# Patient Record
Sex: Female | Born: 1950 | Race: White | Hispanic: No | Marital: Married | Smoking: Former smoker
Health system: Southern US, Community
[De-identification: ages and names within clinical notes are randomized; demographics above are authoritative.]

## PROBLEM LIST (undated history)

## (undated) DIAGNOSIS — E039 Hypothyroidism, unspecified: Secondary | ICD-10-CM

## (undated) DIAGNOSIS — E669 Obesity, unspecified: Secondary | ICD-10-CM

## (undated) DIAGNOSIS — I10 Essential (primary) hypertension: Secondary | ICD-10-CM

## (undated) DIAGNOSIS — J189 Pneumonia, unspecified organism: Secondary | ICD-10-CM

## (undated) DIAGNOSIS — E785 Hyperlipidemia, unspecified: Secondary | ICD-10-CM

## (undated) DIAGNOSIS — M069 Rheumatoid arthritis, unspecified: Secondary | ICD-10-CM

## (undated) DIAGNOSIS — E119 Type 2 diabetes mellitus without complications: Secondary | ICD-10-CM

## (undated) DIAGNOSIS — J439 Emphysema, unspecified: Secondary | ICD-10-CM

## (undated) DIAGNOSIS — E559 Vitamin D deficiency, unspecified: Secondary | ICD-10-CM

## (undated) DIAGNOSIS — I5030 Unspecified diastolic (congestive) heart failure: Secondary | ICD-10-CM

## (undated) HISTORY — PX: TUBAL LIGATION: SHX77

## (undated) HISTORY — PX: COLONOSCOPY: SHX174

## (undated) HISTORY — DX: Hyperlipidemia, unspecified: E78.5

## (undated) HISTORY — DX: Essential (primary) hypertension: I10

## (undated) HISTORY — DX: Rheumatoid arthritis, unspecified: M06.9

## (undated) HISTORY — DX: Vitamin D deficiency, unspecified: E55.9

## (undated) HISTORY — PX: EYE SURGERY: SHX253

---

## 2004-02-14 ENCOUNTER — Ambulatory Visit (HOSPITAL_COMMUNITY): Admission: RE | Admit: 2004-02-14 | Discharge: 2004-02-14 | Payer: Self-pay | Admitting: Internal Medicine

## 2004-02-14 ENCOUNTER — Other Ambulatory Visit: Admission: RE | Admit: 2004-02-14 | Discharge: 2004-02-14 | Payer: Self-pay | Admitting: Internal Medicine

## 2004-06-20 ENCOUNTER — Ambulatory Visit (HOSPITAL_COMMUNITY): Admission: RE | Admit: 2004-06-20 | Discharge: 2004-06-20 | Payer: Self-pay | Admitting: Internal Medicine

## 2005-07-09 ENCOUNTER — Encounter: Admission: RE | Admit: 2005-07-09 | Discharge: 2005-07-09 | Payer: Self-pay | Admitting: Rheumatology

## 2006-02-02 ENCOUNTER — Other Ambulatory Visit: Admission: RE | Admit: 2006-02-02 | Discharge: 2006-02-02 | Payer: Self-pay | Admitting: Internal Medicine

## 2006-08-20 ENCOUNTER — Ambulatory Visit: Payer: Self-pay | Admitting: Critical Care Medicine

## 2006-10-05 ENCOUNTER — Ambulatory Visit: Payer: Self-pay | Admitting: Critical Care Medicine

## 2006-12-16 ENCOUNTER — Ambulatory Visit: Payer: Self-pay | Admitting: Critical Care Medicine

## 2007-03-05 ENCOUNTER — Ambulatory Visit: Payer: Self-pay | Admitting: Critical Care Medicine

## 2007-03-31 ENCOUNTER — Other Ambulatory Visit: Admission: RE | Admit: 2007-03-31 | Discharge: 2007-03-31 | Payer: Self-pay | Admitting: Internal Medicine

## 2007-04-09 ENCOUNTER — Ambulatory Visit: Payer: Self-pay | Admitting: Gastroenterology

## 2007-04-21 ENCOUNTER — Ambulatory Visit: Payer: Self-pay | Admitting: Gastroenterology

## 2007-04-21 ENCOUNTER — Encounter: Payer: Self-pay | Admitting: Gastroenterology

## 2007-04-22 DIAGNOSIS — J449 Chronic obstructive pulmonary disease, unspecified: Secondary | ICD-10-CM | POA: Insufficient documentation

## 2007-04-22 DIAGNOSIS — J45909 Unspecified asthma, uncomplicated: Secondary | ICD-10-CM | POA: Insufficient documentation

## 2007-04-27 ENCOUNTER — Ambulatory Visit (HOSPITAL_COMMUNITY): Admission: RE | Admit: 2007-04-27 | Discharge: 2007-04-27 | Payer: Self-pay | Admitting: Internal Medicine

## 2007-11-18 ENCOUNTER — Encounter: Payer: Self-pay | Admitting: Critical Care Medicine

## 2007-11-23 ENCOUNTER — Encounter: Payer: Self-pay | Admitting: Critical Care Medicine

## 2007-11-23 ENCOUNTER — Telehealth (INDEPENDENT_AMBULATORY_CARE_PROVIDER_SITE_OTHER): Payer: Self-pay | Admitting: *Deleted

## 2008-09-25 ENCOUNTER — Ambulatory Visit (HOSPITAL_COMMUNITY): Admission: RE | Admit: 2008-09-25 | Discharge: 2008-09-25 | Payer: Self-pay | Admitting: Internal Medicine

## 2010-03-18 ENCOUNTER — Encounter: Payer: Self-pay | Admitting: Gastroenterology

## 2010-07-07 ENCOUNTER — Encounter: Payer: Self-pay | Admitting: Internal Medicine

## 2010-07-18 NOTE — Letter (Signed)
Summary: Colonoscopy Letter  [facility]  [address] [place], [place] [address]   Phone: [phone]  Fax: [phone]      March 18, 2010 MRN: [id number]   RIHANA KIDDY [address] [place], [place]  [address]   Dear Ms. Schear,   According to your medical record, it is time for you to schedule a Colonoscopy. The American Cancer Society recommends this procedure as a method to detect early colon cancer. Patients with a family history of colon cancer, or a personal history of colon polyps or inflammatory bowel disease are at increased risk.  This letter has been generated based on the recommendations made at the time of your procedure. If you feel that in your particular situation this may no longer apply, please contact our office.  Please call our office at [phone] to schedule this appointment or to update your records at your earliest convenience.  Thank you for cooperating with [place] to provide you with the very best care possible.   Sincerely,  Rachael Fee, M.D.  [facility] [phone]

## 2010-10-29 NOTE — Assessment & Plan Note (Signed)
[facility]                             PULMONARY OFFICE NOTE   Jacqueline Washington, Jacqueline Washington                      MRN:          [id number]  DATE:12/16/2006                            DOB:          12/09/1950    Jacqueline Washington returns today in followup and has advanced chronic obstructive  lung disease, asthmatic bronchitis.  She is still smoking.  She did not  tolerate Chantix and stopped the medicine because of blurred vision.  Still smokes two packs a day of cigarettes, maintains Advair 500/50 one  spray b.i.d., Spiriva daily, Protonix daily.   EXAM:  Temp 97, blood pressure 120/80, pulse 83, saturation 96% room  air.  CHEST:  Distant breath sounds, prolonged expiratory phase, no wheeze or  rhonchi.  CARDIAC EXAM:  A regular rate and rhythm without S3, normal S1, S2.  ABDOMEN:  Soft, nontender.  EXTREMITIES:  No edema or clubbing.   IMPRESSION:  Asthmatic bronchitis with mild flare.   PLAN:  The patient to receive pulse prednisone with a quick taper and  Avelox 400 mg a day for 5 days for ongoing bronchitis.  The patient will  receive Nicotrol inhaler for smoking cessation, maintain Advair and  Spiriva as is and will see the patient back in followup in 2 months.     Charlcie Cradle Delford Field, MD, [facility]  Electronically Signed    PEW/MedQ  DD: 12/17/2006  DT: 12/17/2006  Job #: [id number]   cc:   Lovenia Kim, D.O.

## 2010-10-29 NOTE — Assessment & Plan Note (Signed)
[facility]                             PULMONARY OFFICE NOTE   Jacqueline Washington, Jacqueline Washington                      MRN:          [id number]  DATE:03/05/2007                            DOB:          [birth year]    Jacqueline Washington is seen in return followup.  Unfortunately, she is still  smoking 2 packs a day.  She has tried a variety of mechanisms.  The  Nicotrol inhaler we offered last time, she says, she cannot afford.  I  pointed out to her that all of her health care medications and visits,  and the amount of cigarettes she is consuming, will soon add up to the  $200 of the cost to get the Nicotrol inhaler.  She took this under  advisement.   EXAM:  Temperature is 97, blood pressure 128/84, pulse 82, saturation  97% on room air.  CHEST:  Showed distant breath sounds, prolonged expiratory phase.  No  wheeze or rhonchi.  CARDIAC:  Showed a regular rate and rhythm without S3.  Normal S1, S2.  ABDOMEN:  Soft, nontender.  EXTREMITIES:  Showed no edema or clubbing.   IMPRESSION:  Chronic obstructive lung disease, asthmatic bronchitic  component, ongoing active smoking.   PLAN:  To maintain Advair and Spiriva as currently dosed.  No change in  plan of care is made and we will see the patient back in 4 months.     Charlcie Cradle Delford Field, MD, [facility]  Electronically Signed    PEW/MedQ  DD: 03/05/2007  DT: 03/05/2007  Job #: [id number]   cc:   Lovenia Kim, D.O.

## 2010-11-01 NOTE — Assessment & Plan Note (Signed)
[facility]                             PULMONARY OFFICE NOTE   TIFFNAY, BOSSI                      MRN:          [id number]  DATE:10/05/2006                            DOB:          20-Oct-1950    Ms. Brierley returns today in follow-up.  Has asthmatic bronchitis, chronic  obstructive lung disease, reflux disease.  She is doing markedly better  on the Protonix.  She is on this at 40 mg daily, maintains Advair 500/50  mcg one spray b.i.d.  She is on the Chantix daily and Spiriva daily.  Her smoking is down to one pack a day.   PHYSICAL EXAMINATION:  VITAL SIGNS:  Temperature 97, blood pressure  120/82, pulse 83, saturation 94% on room air.  CHEST:  Distant breath sounds with prolonged expiratory phase, no wheeze  or rhonchi were noted.  CARDIAC:  A regular rate and rhythm without S3, normal S1, S2.  ABDOMEN:  Soft, nontender.  EXTREMITIES:  No edema or clubbing.   IMPRESSION:  Asthmatic bronchitis with stable airflow function.   PLAN:  Maintain Advair, Spiriva and Protonix as prescribed.  She is to  continue to pursue smoking cessation with Chantix, and we will see the  patient back in return follow-up in 2 months.     Charlcie Cradle Delford Field, MD, [facility]  Electronically Signed    PEW/MedQ  DD: 10/05/2006  DT: 10/06/2006  Job #: [id number]   cc:   Lovenia Kim, D.O.

## 2010-11-01 NOTE — Assessment & Plan Note (Signed)
[facility]                             PULMONARY OFFICE NOTE   NAME:Jacqueline Washington, Prehn             MRN:          [id number]  DATE:08/20/2006                            DOB:          November 17, 1950    CHIEF COMPLAINT:  Evaluate pneumonia.   This is a 60 year old white female, who was diagnosed with pneumonia on  February 21.  Symptoms began on February 13 with increased dyspnea.  She  was treated with antibiotics as if she had pneumonia, but an x-ray was  not obtained, and she then later had a CT scan of the chest, which does  not show pneumonia, but she was referred for further evaluation.  She  was given prednisone and a Z-Pak, followed by Levaquin and then more  prednisone, which she is all finishing.  She is a heavy smoker, smoking  two packs a day for 42 years.  She is now on Chantix for this, but is  still smoking some.  Chantix was just started on August 19, 2006.  Shortness of breath is better.  The cough is dry.  It was productive,  thick, green, but now is dry.  She had some chest pain, but is now  better.  She has been on Spiriva and Advair since 2004, had also  pneumonia in 2004 and also when she was younger.  She is referred now  for further evaluation.  She is short of breath with rest and exertion.  She does note increased acid reflux symptoms currently.  She denies any  loss of appetite, weight change, abdominal pain, difficulty swallowing.  There is no sore throat, headache, sneezing, itching, earache, anxiety,  depression, hand or foot swelling, joint stiffness or pain, or rash.   PAST MEDICAL HISTORY:  Medical history of asthma, emphysema, history of  tubal ligation.   His sister had TB in the fifties.   MEDICATION ALLERGIES:  PENICILLIN, SULFA.   SOCIAL HISTORY:  The patient smokes, as noted above.  Currently not  employed.  Lives with her husband and children at home.   FAMILY HISTORY:  Allergies in her mother.  Heart  disease in her mother.   REVIEW OF SYSTEMS:  Otherwise noncontributory.   CURRENT MEDICATIONS:  1. Spiriva one daily.  2. Advair 250/50 one spray b.i.d.  3. Chantix daily.   PHYSICAL EXAM:  This is an obese female in no distress.  Temperature 98,  blood pressure 134/86, pulse 84, saturation 96% on room air.  CHEST:  Showed distant breath sounds with prolonged expiratory phase, no  wheeze or rhonchi are noted.  CARDIAC EXAM:  Showed a regular rate and rhythm without S3, normal S1  and S2.  ABDOMEN:  Soft, protuberant.  Bowel sounds active.  EXTREMITIES:  Showed no edema, clubbing or venous disease.  SKIN:  Clear.  NEUROLOGIC EXAM:  Intact.  HEENT EXAM:  Showed no jugular venous distention or lymphadenopathy.  Oropharynx is clear.  The neck is supple.   A CT scan of the chest was obtained and reviewed and revealed no  pneumonia.  Hyperinflation with emphysema is noted and borderline  mediastinal hilar nodes, likely reactive.  IMPRESSION:  1. Asthmatic bronchitis.  2. Primary emphysema.  3. Reflux disease.  4. No current pneumonia.  5. Obesity.   RECOMMENDATIONS:  Pursue smoking cessation with Chantix.  Check full  pulmonary functions.  She was re-instructed as to proper Spiriva Advair  technique.  Advair increased to 500/50 one spray b.i.d. and Protonix  administered at 40 mg daily with a GERD education sheet given.  She will  return to this office in six weeks.     Charlcie Cradle Delford Field, MD, [facility]  Electronically Signed    PEW/MedQ  DD: 08/21/2006  DT: 08/21/2006  Job #: [id number]   cc:   Lovenia Kim, D.O.

## 2011-03-04 ENCOUNTER — Other Ambulatory Visit (HOSPITAL_COMMUNITY): Payer: Self-pay | Admitting: Internal Medicine

## 2011-03-04 ENCOUNTER — Ambulatory Visit (HOSPITAL_COMMUNITY)
Admission: RE | Admit: 2011-03-04 | Discharge: 2011-03-04 | Disposition: A | Discharge status: Home / Self Care | Payer: Medicare Other | Source: Ambulatory Visit | Attending: Internal Medicine | Admitting: Internal Medicine

## 2011-03-04 DIAGNOSIS — R0602 Shortness of breath: Secondary | ICD-10-CM

## 2011-03-04 DIAGNOSIS — R059 Cough, unspecified: Secondary | ICD-10-CM | POA: Insufficient documentation

## 2011-03-04 DIAGNOSIS — Z0000 Encounter for general adult medical examination without abnormal findings: Secondary | ICD-10-CM | POA: Insufficient documentation

## 2011-03-04 DIAGNOSIS — R05 Cough: Secondary | ICD-10-CM | POA: Insufficient documentation

## 2011-03-04 DIAGNOSIS — J438 Other emphysema: Secondary | ICD-10-CM | POA: Insufficient documentation

## 2011-03-04 DIAGNOSIS — Z1231 Encounter for screening mammogram for malignant neoplasm of breast: Secondary | ICD-10-CM

## 2011-03-13 ENCOUNTER — Ambulatory Visit (HOSPITAL_COMMUNITY)
Admission: RE | Admit: 2011-03-13 | Discharge: 2011-03-13 | Disposition: A | Discharge status: Home / Self Care | Payer: Medicare Other | Source: Ambulatory Visit | Attending: Internal Medicine | Admitting: Internal Medicine

## 2011-03-13 DIAGNOSIS — Z1231 Encounter for screening mammogram for malignant neoplasm of breast: Secondary | ICD-10-CM | POA: Insufficient documentation

## 2011-06-26 ENCOUNTER — Other Ambulatory Visit: Payer: Self-pay | Admitting: Internal Medicine

## 2011-06-26 DIAGNOSIS — R0602 Shortness of breath: Secondary | ICD-10-CM

## 2011-06-26 DIAGNOSIS — R0600 Dyspnea, unspecified: Secondary | ICD-10-CM

## 2011-06-27 ENCOUNTER — Ambulatory Visit
Admission: RE | Admit: 2011-06-27 | Discharge: 2011-06-27 | Disposition: A | Discharge status: Home / Self Care | Payer: Medicare Other | Source: Ambulatory Visit | Attending: Internal Medicine | Admitting: Internal Medicine

## 2011-06-27 DIAGNOSIS — R0600 Dyspnea, unspecified: Secondary | ICD-10-CM

## 2011-06-27 DIAGNOSIS — R0602 Shortness of breath: Secondary | ICD-10-CM

## 2011-06-27 MED ORDER — IOHEXOL 350 MG/ML SOLN
125.0000 mL | Freq: Once | INTRAVENOUS | Status: AC | PRN
  Administered 2011-06-27: 125 mL via INTRAVENOUS

## 2011-07-15 ENCOUNTER — Other Ambulatory Visit (HOSPITAL_COMMUNITY): Payer: Self-pay | Admitting: Internal Medicine

## 2011-07-15 DIAGNOSIS — I2699 Other pulmonary embolism without acute cor pulmonale: Secondary | ICD-10-CM

## 2011-07-21 ENCOUNTER — Encounter (HOSPITAL_COMMUNITY)
Admission: RE | Admit: 2011-07-21 | Discharge: 2011-07-21 | Disposition: A | Discharge status: Home / Self Care | Payer: Medicare Other | Source: Ambulatory Visit | Attending: Internal Medicine | Admitting: Internal Medicine

## 2011-07-21 DIAGNOSIS — J984 Other disorders of lung: Secondary | ICD-10-CM | POA: Insufficient documentation

## 2011-07-21 DIAGNOSIS — I2699 Other pulmonary embolism without acute cor pulmonale: Secondary | ICD-10-CM

## 2011-07-21 MED ORDER — FLUDEOXYGLUCOSE F - 18 (FDG) INJECTION
20.6000 | Freq: Once | INTRAVENOUS | Status: AC | PRN
  Administered 2011-07-21: 20.6 via INTRAVENOUS

## 2011-08-08 ENCOUNTER — Institutional Professional Consult (permissible substitution): Payer: Medicare Other | Admitting: Pulmonary Disease

## 2011-08-12 ENCOUNTER — Ambulatory Visit (INDEPENDENT_AMBULATORY_CARE_PROVIDER_SITE_OTHER)
Admission: RE | Admit: 2011-08-12 | Discharge: 2011-08-12 | Disposition: A | Discharge status: Home / Self Care | Payer: Medicare Other | Source: Ambulatory Visit | Attending: Critical Care Medicine | Admitting: Critical Care Medicine

## 2011-08-12 ENCOUNTER — Other Ambulatory Visit (INDEPENDENT_AMBULATORY_CARE_PROVIDER_SITE_OTHER): Payer: Medicare Other

## 2011-08-12 ENCOUNTER — Ambulatory Visit (INDEPENDENT_AMBULATORY_CARE_PROVIDER_SITE_OTHER): Payer: Medicare Other | Admitting: Critical Care Medicine

## 2011-08-12 ENCOUNTER — Encounter: Payer: Self-pay | Admitting: Critical Care Medicine

## 2011-08-12 DIAGNOSIS — E876 Hypokalemia: Secondary | ICD-10-CM

## 2011-08-12 DIAGNOSIS — R918 Other nonspecific abnormal finding of lung field: Secondary | ICD-10-CM

## 2011-08-12 DIAGNOSIS — J449 Chronic obstructive pulmonary disease, unspecified: Secondary | ICD-10-CM

## 2011-08-12 DIAGNOSIS — L0390 Cellulitis, unspecified: Secondary | ICD-10-CM

## 2011-08-12 DIAGNOSIS — R05 Cough: Secondary | ICD-10-CM

## 2011-08-12 LAB — CBC WITH DIFFERENTIAL/PLATELET
Eosinophils Absolute: 0.3 10*3/uL (ref 0.0–0.7)
HCT: 43.4 % (ref 36.0–46.0)
Lymphs Abs: 2.2 10*3/uL (ref 0.7–4.0)
MCHC: 32.4 g/dL (ref 30.0–36.0)
MCV: 98.6 fl (ref 78.0–100.0)
Monocytes Absolute: 1.2 10*3/uL — ABNORMAL HIGH (ref 0.1–1.0)
Neutrophils Relative %: 49.2 % (ref 43.0–77.0)
Platelets: 210 10*3/uL (ref 150.0–400.0)
RDW: 16.4 % — ABNORMAL HIGH (ref 11.5–14.6)
WBC: 7.4 10*3/uL (ref 4.5–10.5)

## 2011-08-12 LAB — BASIC METABOLIC PANEL
BUN: 27 mg/dL — ABNORMAL HIGH (ref 6–23)
CO2: 41 mEq/L — ABNORMAL HIGH (ref 19–32)
Calcium: 9.7 mg/dL (ref 8.4–10.5)
Creatinine, Ser: 1.2 mg/dL (ref 0.4–1.2)
Glucose, Bld: 104 mg/dL — ABNORMAL HIGH (ref 70–99)

## 2011-08-12 NOTE — Progress Notes (Signed)
Subjective:    Patient ID: Jacqueline Washington, female    DOB: 10-27-1950, 60 y.o.   MRN: [id number]  HPI 61 y.o. WF referred for abn CT Pt noted onset bad URI onset end 05/2011.  Noted a cough, no fever.  Noted chest pain ant chest area. Noted more dyspnea with minimal exertion.  Rx Abx doxy, prednisone.  CXR abnormal.  CT abn but PET scan did not show uptake.   Then got Zpak and pred and this helped more. Now currently: is still dyspneic.  Notes wheeze with walking. Pt denies back to baseline. No real coughing.  Pt notes light headed and nauseated.  Notes dysphagia. No heartburn No fever now.  Past Medical History  Diagnosis Date  . Asthma   . Emphysema   . Rheumatoid arthritis      Family History  Problem Relation Age of Onset  . Asthma Mother   . Heart disease Mother   . Clotting disorder Mother      History   Social History  . Marital Status: Married    Spouse Name: N/A    Number of Children: 2  . Years of Education: N/A   Occupational History  .     Social History Main Topics  . Smoking status: Former Smoker -- 3.0 packs/day for 43 years    Types: Cigarettes    Quit date: 04/24/2007  . Smokeless tobacco: Never Used  . Alcohol Use: Yes     occasional  . Drug Use: No  . Sexually Active: Not on file   Other Topics Concern  . Not on file   Social History Narrative  . No narrative on file     Allergies  Allergen Reactions  . Penicillins   . Sulfonamide Derivatives   . Tetanus Toxoid      No outpatient prescriptions prior to visit.      Review of Systems  Constitutional: Positive for activity change, appetite change and fatigue. Negative for fever, chills, diaphoresis and unexpected weight change.  HENT: Positive for trouble swallowing and voice change. Negative for hearing loss, ear pain, nosebleeds, congestion, sore throat, facial swelling, rhinorrhea, sneezing, mouth sores, neck pain, neck stiffness, dental problem, postnasal drip, sinus pressure,  tinnitus and ear discharge.   Eyes: Positive for visual disturbance. Negative for photophobia, discharge and itching.  Respiratory: Positive for chest tightness, shortness of breath and wheezing. Negative for apnea, cough, choking and stridor.   Cardiovascular: Positive for leg swelling. Negative for chest pain and palpitations.  Gastrointestinal: Negative for nausea, vomiting, abdominal pain, constipation, blood in stool and abdominal distention.  Genitourinary: Negative for dysuria, urgency, frequency, hematuria, flank pain, decreased urine volume and difficulty urinating.  Musculoskeletal: Positive for back pain, arthralgias and gait problem. Negative for myalgias and joint swelling.  Skin: Negative for color change, pallor and rash.  Neurological: Positive for dizziness, weakness and light-headedness. Negative for tremors, seizures, syncope, speech difficulty, numbness and headaches.  Hematological: Negative for adenopathy. Bruises/bleeds easily.  Psychiatric/Behavioral: Positive for confusion. Negative for sleep disturbance and agitation. The patient is not nervous/anxious.        Objective:   Physical Exam Filed Vitals:   08/12/11 1549 08/12/11 1553  BP: 114/76   Pulse: 86   Temp: 98.1 F (36.7 C)   TempSrc: Oral   Height: 5\' 4"  (1.626 m)   Weight: 243 lb 9.6 oz (110.496 kg)   SpO2: 89% 92%    Gen: Pleasant, well-nourished, in no distress,  normal affect  ENT:  No lesions,  mouth clear,  oropharynx clear, no postnasal drip  Neck: No JVD, no TMG, no carotid bruits  Lungs: No use of accessory muscles, no dullness to percussion, distant breath sounds   Cardiovascular: RRR, heart sounds normal, no murmur or gallops, no peripheral edema  Abdomen: soft and NT, no HSM,  BS normal  Musculoskeletal: No deformities, no cyanosis or clubbing  Neuro: alert, non focal  Skin  resolving cellulitis   Dg Chest 2 View  08/12/2011  *RADIOLOGY REPORT*  Clinical Data: Cough.  Asthma.   COPD.  Former smoker.  CHEST - 2 VIEW  Comparison: 03/04/2011  Findings: Scarring in the right upper lobe is stable.  Pulmonary interstitial prominence and mild hyperinflation is also stable. There is no evidence of acute infiltrate or pleural effusion. Heart size is normal.  Hilar and mediastinal contours are unremarkable and stable.  IMPRESSION: Stable COPD and right upper lobe scarring.  No acute findings.  Original Report Authenticated By: Danae Orleans, M.D.          Assessment & Plan:   OBSTRUCTIVE CHRONIC BRONCHITIS WITHOUT EXACERBAT Chronic obstructive lung disease with asthmatic bronchitic component stable at this time Plan Maintain inhaled medications as currently prescribed  Pulmonary nodules Bilateral pulmonary nodules most of which are scarring in right upper lobe. The left lower lobe showed a result resolution of the infiltrate which likely was pneumonic process Plan No evidence of malignancy No need for biopsy Will follow  Hypokalemia Hypokalemia documented on lab drawn today Plan Hold diuretics Increase potassium supplementation Case discussed with patient's primary care provider who will follow and monitor  Cellulitis Cellulitis of both lower extremities now resolving with associated adverse side effects do to doxycycline Plan Discontinue further doxycycline   Note chest x-ray obtained today does not show evidence of active disease process Updated Medication List Outpatient Encounter Prescriptions as of 08/12/2011  Medication Sig Dispense Refill  . Ascorbic Acid (VITAMIN C) 1000 MG tablet Take 1,000 mg by mouth daily.      . cetirizine (ZYRTEC) 10 MG tablet Take 10 mg by mouth daily.      . Cholecalciferol (VITAMIN D-3) 5000 UNITS TABS Take 1 tablet by mouth daily.      . Cyanocobalamin (VITAMIN B12 TR PO) Take 2,500 mcg by mouth daily.      . Fluticasone-Salmeterol (ADVAIR DISKUS) 500-50 MCG/DOSE AEPB Inhale 1 puff into the lungs every 12 (twelve) hours.       . folic acid (FOLVITE) 1 MG tablet Take 1 mg by mouth daily.      . furosemide (LASIX) 40 MG tablet Take 1 tablet by mouth Twice daily.      Marland Kitchen KLOR-CON M20 20 MEQ tablet Take 1 tablet by mouth Twice daily.      Marland Kitchen levothyroxine (SYNTHROID, LEVOTHROID) 50 MCG tablet Take 1 tablet by mouth Daily.      . methotrexate (RHEUMATREX) 2.5 MG tablet Take 6 tablets by mouth Once a week.      . metolazone (ZAROXOLYN) 5 MG tablet Take 1 tablet by mouth Daily.      . Multiple Vitamin (MULTIVITAMIN) tablet Take 1 tablet by mouth daily.      Marland Kitchen SPIRIVA HANDIHALER 18 MCG inhalation capsule Place 1 capsule into inhaler and inhale Daily.      Marland Kitchen DISCONTD: doxycycline (VIBRA-TABS) 100 MG tablet Take 1 tablet by mouth Twice daily.

## 2011-08-12 NOTE — Patient Instructions (Signed)
Stop doxycycline Stay on inhalers as prescribed Chest xray today Labs today Full pulmonary function studies Return 3-4 weeks [facility] office

## 2011-08-13 ENCOUNTER — Telehealth: Payer: Self-pay | Admitting: Critical Care Medicine

## 2011-08-13 DIAGNOSIS — R918 Other nonspecific abnormal finding of lung field: Secondary | ICD-10-CM | POA: Insufficient documentation

## 2011-08-13 DIAGNOSIS — E876 Hypokalemia: Secondary | ICD-10-CM | POA: Insufficient documentation

## 2011-08-13 NOTE — Progress Notes (Signed)
Quick Note:  Notify the patient that the Xray is stable and no pneumonia Tell her pneumonia has resolved. Nodules are benign, just scar tissue. Doubt related to rheumatoid arthritis No change in medications are recommended. Continue current meds as prescribed at last office visit ______

## 2011-08-13 NOTE — Assessment & Plan Note (Signed)
Cellulitis of both lower extremities now resolving with associated adverse side effects do to doxycycline Plan Discontinue further doxycycline

## 2011-08-13 NOTE — Telephone Encounter (Signed)
Notes Recorded by Shan Levans, MD on 08/13/2011 at 9:07 AM Notify the patient that the Xray is stable and no pneumonia Tell her pneumonia has resolved. Nodules are benign, just scar tissue. Doubt related to rheumatoid arthritis No change in medications are recommended. Continue current meds as prescribed at last office visit   Pt aware of results; no questions or concerns at this time.

## 2011-08-13 NOTE — Progress Notes (Signed)
Quick Note:  Pt aware of results;no questions or concerns at this time. ______ 

## 2011-08-13 NOTE — Assessment & Plan Note (Signed)
Chronic obstructive lung disease with asthmatic bronchitic component stable at this time Plan Maintain inhaled medications as currently prescribed

## 2011-08-13 NOTE — Assessment & Plan Note (Signed)
Bilateral pulmonary nodules most of which are scarring in right upper lobe. The left lower lobe showed a result resolution of the infiltrate which likely was pneumonic process Plan No evidence of malignancy No need for biopsy Will follow

## 2011-08-13 NOTE — Assessment & Plan Note (Signed)
Hypokalemia documented on lab drawn today Plan Hold diuretics Increase potassium supplementation Case discussed with patient's primary care provider who will follow and monitor

## 2011-08-13 NOTE — Progress Notes (Signed)
Quick Note:  [id number] ______ 

## 2011-08-26 LAB — HYPERSENSITIVITY PNUEMONITIS PROFILE

## 2011-08-26 LAB — FUNGAL ANTIBODIES PANEL, ID-BLOOD
Aspergillus Flavus Antibodies: NEGATIVE
Aspergillus Niger Antibodies: NEGATIVE

## 2011-08-26 NOTE — Progress Notes (Signed)
Quick Note:  Called, spoke with pt. 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 Delford Field. Advised he will discuss this in further at next OV. She verbalized understanding of this and voiced no further questions/concerns at this time. ______

## 2011-08-28 ENCOUNTER — Other Ambulatory Visit: Payer: Self-pay | Admitting: Rheumatology

## 2011-08-28 DIAGNOSIS — M069 Rheumatoid arthritis, unspecified: Secondary | ICD-10-CM

## 2011-09-08 ENCOUNTER — Ambulatory Visit
Admission: RE | Admit: 2011-09-08 | Discharge: 2011-09-08 | Disposition: A | Discharge status: Home / Self Care | Payer: No Typology Code available for payment source | Source: Ambulatory Visit | Attending: Rheumatology | Admitting: Rheumatology

## 2011-09-08 DIAGNOSIS — M069 Rheumatoid arthritis, unspecified: Secondary | ICD-10-CM

## 2011-09-08 MED ORDER — GADOBENATE DIMEGLUMINE 529 MG/ML IV SOLN
20.0000 mL | Freq: Once | INTRAVENOUS | Status: AC | PRN
  Administered 2011-09-08: 20 mL via INTRAVENOUS

## 2011-09-09 ENCOUNTER — Ambulatory Visit (INDEPENDENT_AMBULATORY_CARE_PROVIDER_SITE_OTHER): Payer: Medicare Other | Admitting: Critical Care Medicine

## 2011-09-09 ENCOUNTER — Encounter: Payer: Self-pay | Admitting: Critical Care Medicine

## 2011-09-09 VITALS — BP 128/80 | HR 72 | Temp 97.6°F | Ht 64.0 in | Wt 234.4 lb

## 2011-09-09 DIAGNOSIS — J449 Chronic obstructive pulmonary disease, unspecified: Secondary | ICD-10-CM

## 2011-09-09 DIAGNOSIS — R918 Other nonspecific abnormal finding of lung field: Secondary | ICD-10-CM

## 2011-09-09 NOTE — Assessment & Plan Note (Signed)
Stable asthmatic bronchitis with recent exacerbation due to URI The patient also has mold exposure in the home with indoor plants and positive Aspergillus assay on fungal immunodiffusion assay Plan Clean the plants properly or discard plants that have mold growth on them Maintain inhaled medications as prescribed

## 2011-09-09 NOTE — Assessment & Plan Note (Signed)
Likely benign pulmonary nodules without change

## 2011-09-09 NOTE — Progress Notes (Signed)
Subjective:    Patient ID: Jacqueline Washington, female    DOB: August 21, 1950, 61 y.o.   MRN: [id number]  HPI  61 y.o. WF referred for abn CT Pt noted onset bad URI onset end 05/2011.  Noted a cough, no fever.  Noted chest pain ant chest area. Noted more dyspnea with minimal exertion.  Rx Abx doxy, prednisone.  CXR abnormal.  CT abn but PET scan did not show uptake.   Then got Zpak and pred and this helped more. Now currently: is still dyspneic.  Notes wheeze with walking. Pt denies back to baseline. No real coughing.  Pt notes light headed and nauseated.  Notes dysphagia. No heartburn No fever now.  09/09/2011 K level is better.  No cough now.  Fungal study:  Pos aspergillous antibody. Now no wheeze.  Cellulitis is better Pt denies any significant sore throat, nasal congestion or excess secretions, fever, chills, sweats, unintended weight loss, pleurtic or exertional chest pain, orthopnea PND, or leg swelling Pt denies any increase in rescue therapy over baseline, denies waking up needing it or having any early am or nocturnal exacerbations of coughing/wheezing/or dyspnea. Pt also denies any obvious fluctuation in symptoms with  weather or environmental change or other alleviating or aggravating factors   Past Medical History  Diagnosis Date  . Asthma   . Emphysema   . Rheumatoid arthritis      Family History  Problem Relation Age of Onset  . Asthma Mother   . Heart disease Mother   . Clotting disorder Mother      History   Social History  . Marital Status: Married    Spouse Name: N/A    Number of Children: 2  . Years of Education: N/A   Occupational History  .     Social History Main Topics  . Smoking status: Former Smoker -- 3.0 packs/day for 43 years    Types: Cigarettes    Quit date: 04/24/2007  . Smokeless tobacco: Never Used  . Alcohol Use: Yes     occasional  . Drug Use: No  . Sexually Active: Not on file   Other Topics Concern  . Not on file   Social  History Narrative  . No narrative on file     Allergies  Allergen Reactions  . Penicillins   . Sulfonamide Derivatives   . Tetanus Toxoid      Outpatient Prescriptions Prior to Visit  Medication Sig Dispense Refill  . Fluticasone-Salmeterol (ADVAIR DISKUS) 500-50 MCG/DOSE AEPB Inhale 1 puff into the lungs every 12 (twelve) hours.      . folic acid (FOLVITE) 1 MG tablet Take 1 mg by mouth daily.      . furosemide (LASIX) 40 MG tablet Take 1 tablet by mouth daily.       Marland Kitchen KLOR-CON M20 20 MEQ tablet Take 8 tablets by mouth daily.       Marland Kitchen levothyroxine (SYNTHROID, LEVOTHROID) 50 MCG tablet Take 1 tablet by mouth Daily.      . methotrexate (RHEUMATREX) 2.5 MG tablet Take 6 tablets by mouth Once a week.      . metolazone (ZAROXOLYN) 5 MG tablet Take 1 tablet by mouth Daily.      Marland Kitchen SPIRIVA HANDIHALER 18 MCG inhalation capsule Place 1 capsule into inhaler and inhale Daily.      . Ascorbic Acid (VITAMIN C) 1000 MG tablet Take 1,000 mg by mouth daily.      . cetirizine (ZYRTEC) 10 MG tablet Take 10  mg by mouth daily.      . Cholecalciferol (VITAMIN D-3) 5000 UNITS TABS Take 1 tablet by mouth daily.      . Cyanocobalamin (VITAMIN B12 TR PO) Take 2,500 mcg by mouth daily.      . Multiple Vitamin (MULTIVITAMIN) tablet Take 1 tablet by mouth daily.       Facility-Administered Medications Prior to Visit  Medication Dose Route Frequency Provider Last Rate Last Dose  . gadobenate dimeglumine (MULTIHANCE) injection 20 mL  20 mL Intravenous Once PRN Medication Radiologist, MD   20 mL at 09/08/11 1522      Review of Systems  Constitutional: Positive for activity change, appetite change and fatigue. Negative for fever, chills, diaphoresis and unexpected weight change.  HENT: Positive for trouble swallowing and voice change. Negative for hearing loss, ear pain, nosebleeds, congestion, sore throat, facial swelling, rhinorrhea, sneezing, mouth sores, neck pain, neck stiffness, dental problem, postnasal  drip, sinus pressure, tinnitus and ear discharge.   Eyes: Positive for visual disturbance. Negative for photophobia, discharge and itching.  Respiratory: Positive for chest tightness, shortness of breath and wheezing. Negative for apnea, cough, choking and stridor.   Cardiovascular: Positive for leg swelling. Negative for chest pain and palpitations.  Gastrointestinal: Negative for nausea, vomiting, abdominal pain, constipation, blood in stool and abdominal distention.  Genitourinary: Negative for dysuria, urgency, frequency, hematuria, flank pain, decreased urine volume and difficulty urinating.  Musculoskeletal: Positive for back pain, arthralgias and gait problem. Negative for myalgias and joint swelling.  Skin: Negative for color change, pallor and rash.  Neurological: Positive for dizziness, weakness and light-headedness. Negative for tremors, seizures, syncope, speech difficulty, numbness and headaches.  Hematological: Negative for adenopathy. Bruises/bleeds easily.  Psychiatric/Behavioral: Positive for confusion. Negative for sleep disturbance and agitation. The patient is not nervous/anxious.        Objective:   Physical Exam  Filed Vitals:   09/09/11 1121  BP: 128/80  Pulse: 72  Temp: 97.6 F (36.4 C)  TempSrc: Oral  Height: 5\' 4"  (1.626 m)  Weight: 234 lb 6.4 oz (106.323 kg)  SpO2: 93%    Gen: Pleasant, well-nourished, in no distress,  normal affect  ENT: No lesions,  mouth clear,  oropharynx clear, no postnasal drip  Neck: No JVD, no TMG, no carotid bruits  Lungs: No use of accessory muscles, no dullness to percussion, distant breath sounds   Cardiovascular: RRR, heart sounds normal, no murmur or gallops, no peripheral edema  Abdomen: soft and NT, no HSM,  BS normal  Musculoskeletal: No deformities, no cyanosis or clubbing  Neuro: alert, non focal  Skin clear        Assessment & Plan:   OBSTRUCTIVE CHRONIC BRONCHITIS WITHOUT EXACERBAT Stable asthmatic  bronchitis with recent exacerbation due to URI The patient also has mold exposure in the home with indoor plants and positive Aspergillus assay on fungal immunodiffusion assay Plan Clean the plants properly or discard plants that have mold growth on them Maintain inhaled medications as prescribed  Pulmonary nodules Likely benign pulmonary nodules without change    Note chest x-ray obtained today does not show evidence of active disease process Updated Medication List Outpatient Encounter Prescriptions as of 09/09/2011  Medication Sig Dispense Refill  . allopurinol (ZYLOPRIM) 100 MG tablet Take 1 tablet by mouth Daily.      . Fluticasone-Salmeterol (ADVAIR DISKUS) 500-50 MCG/DOSE AEPB Inhale 1 puff into the lungs every 12 (twelve) hours.      . folic acid (FOLVITE) 1 MG tablet Take  1 mg by mouth daily.      . furosemide (LASIX) 40 MG tablet Take 1 tablet by mouth daily.       Marland Kitchen KLOR-CON M20 20 MEQ tablet Take 8 tablets by mouth daily.       Marland Kitchen levothyroxine (SYNTHROID, LEVOTHROID) 50 MCG tablet Take 1 tablet by mouth Daily.      . metFORMIN (GLUCOPHAGE) 500 MG tablet Take 0.5 tablets by mouth Daily.      . methotrexate (RHEUMATREX) 2.5 MG tablet Take 6 tablets by mouth Once a week.      . metolazone (ZAROXOLYN) 5 MG tablet Take 1 tablet by mouth Daily.      . predniSONE (DELTASONE) 5 MG tablet Take 1 tablet by mouth Daily.      Marland Kitchen SPIRIVA HANDIHALER 18 MCG inhalation capsule Place 1 capsule into inhaler and inhale Daily.      Marland Kitchen DISCONTD: Ascorbic Acid (VITAMIN C) 1000 MG tablet Take 1,000 mg by mouth daily.      Marland Kitchen DISCONTD: cetirizine (ZYRTEC) 10 MG tablet Take 10 mg by mouth daily.      Marland Kitchen DISCONTD: Cholecalciferol (VITAMIN D-3) 5000 UNITS TABS Take 1 tablet by mouth daily.      Marland Kitchen DISCONTD: Cyanocobalamin (VITAMIN B12 TR PO) Take 2,500 mcg by mouth daily.      Marland Kitchen DISCONTD: Multiple Vitamin (MULTIVITAMIN) tablet Take 1 tablet by mouth daily.       Facility-Administered Encounter Medications  as of 09/09/2011  Medication Dose Route Frequency Provider Last Rate Last Dose  . gadobenate dimeglumine (MULTIHANCE) injection 20 mL  20 mL Intravenous Once PRN Medication Radiologist, MD   20 mL at 09/08/11 1522

## 2011-09-09 NOTE — Patient Instructions (Signed)
Please clean out older plants that have mold on them No change in medications Return 4 months

## 2011-09-26 ENCOUNTER — Ambulatory Visit (INDEPENDENT_AMBULATORY_CARE_PROVIDER_SITE_OTHER): Payer: Medicare Other | Admitting: Critical Care Medicine

## 2011-09-26 DIAGNOSIS — R05 Cough: Secondary | ICD-10-CM

## 2011-09-26 DIAGNOSIS — J449 Chronic obstructive pulmonary disease, unspecified: Secondary | ICD-10-CM

## 2011-09-26 LAB — PULMONARY FUNCTION TEST

## 2011-09-26 NOTE — Progress Notes (Signed)
PFT done today. 

## 2011-10-01 ENCOUNTER — Encounter: Payer: Self-pay | Admitting: Critical Care Medicine

## 2011-10-08 ENCOUNTER — Other Ambulatory Visit: Payer: Self-pay | Admitting: Rheumatology

## 2011-10-08 DIAGNOSIS — M069 Rheumatoid arthritis, unspecified: Secondary | ICD-10-CM

## 2011-10-20 ENCOUNTER — Ambulatory Visit
Admission: RE | Admit: 2011-10-20 | Discharge: 2011-10-20 | Disposition: A | Discharge status: Home / Self Care | Payer: No Typology Code available for payment source | Source: Ambulatory Visit | Attending: Rheumatology | Admitting: Rheumatology

## 2011-10-20 DIAGNOSIS — M069 Rheumatoid arthritis, unspecified: Secondary | ICD-10-CM

## 2011-10-20 MED ORDER — GADOBENATE DIMEGLUMINE 529 MG/ML IV SOLN
20.0000 mL | Freq: Once | INTRAVENOUS | Status: AC | PRN
  Administered 2011-10-20: 20 mL via INTRAVENOUS

## 2011-11-20 ENCOUNTER — Other Ambulatory Visit: Payer: Self-pay | Admitting: Rheumatology

## 2011-11-20 DIAGNOSIS — M069 Rheumatoid arthritis, unspecified: Secondary | ICD-10-CM

## 2011-11-27 ENCOUNTER — Other Ambulatory Visit: Payer: Self-pay | Admitting: Rheumatology

## 2011-11-27 ENCOUNTER — Ambulatory Visit
Admission: RE | Admit: 2011-11-27 | Discharge: 2011-11-27 | Disposition: A | Discharge status: Home / Self Care | Payer: No Typology Code available for payment source | Source: Ambulatory Visit | Attending: Rheumatology | Admitting: Rheumatology

## 2011-11-27 DIAGNOSIS — M069 Rheumatoid arthritis, unspecified: Secondary | ICD-10-CM

## 2011-12-01 ENCOUNTER — Other Ambulatory Visit: Payer: Self-pay

## 2011-12-05 ENCOUNTER — Ambulatory Visit
Admission: RE | Admit: 2011-12-05 | Discharge: 2011-12-05 | Disposition: A | Discharge status: Home / Self Care | Payer: No Typology Code available for payment source | Source: Ambulatory Visit | Attending: Rheumatology | Admitting: Rheumatology

## 2011-12-05 DIAGNOSIS — M069 Rheumatoid arthritis, unspecified: Secondary | ICD-10-CM

## 2011-12-05 MED ORDER — GADOBENATE DIMEGLUMINE 529 MG/ML IV SOLN
10.0000 mL | Freq: Once | INTRAVENOUS | Status: AC | PRN
  Administered 2011-12-05: 10 mL via INTRAVENOUS

## 2012-01-07 ENCOUNTER — Ambulatory Visit: Payer: Self-pay | Admitting: Critical Care Medicine

## 2012-01-12 ENCOUNTER — Other Ambulatory Visit (HOSPITAL_COMMUNITY): Payer: Self-pay | Admitting: Internal Medicine

## 2012-01-12 ENCOUNTER — Ambulatory Visit (HOSPITAL_COMMUNITY)
Admission: RE | Admit: 2012-01-12 | Discharge: 2012-01-12 | Disposition: A | Discharge status: Home / Self Care | Payer: Self-pay | Source: Ambulatory Visit | Attending: Internal Medicine | Admitting: Internal Medicine

## 2012-01-12 DIAGNOSIS — R0602 Shortness of breath: Secondary | ICD-10-CM

## 2012-01-26 ENCOUNTER — Encounter: Payer: Self-pay | Admitting: *Deleted

## 2012-02-02 ENCOUNTER — Encounter: Payer: Self-pay | Admitting: Cardiovascular Disease

## 2012-02-02 ENCOUNTER — Ambulatory Visit (INDEPENDENT_AMBULATORY_CARE_PROVIDER_SITE_OTHER): Payer: Medicare Other | Admitting: Cardiovascular Disease

## 2012-02-02 ENCOUNTER — Other Ambulatory Visit: Payer: Self-pay | Admitting: Rheumatology

## 2012-02-02 VITALS — BP 140/82 | HR 71 | Ht 64.0 in | Wt 225.8 lb

## 2012-02-02 DIAGNOSIS — R609 Edema, unspecified: Secondary | ICD-10-CM

## 2012-02-02 DIAGNOSIS — M069 Rheumatoid arthritis, unspecified: Secondary | ICD-10-CM

## 2012-02-02 DIAGNOSIS — R600 Localized edema: Secondary | ICD-10-CM

## 2012-02-02 DIAGNOSIS — R0600 Dyspnea, unspecified: Secondary | ICD-10-CM

## 2012-02-02 NOTE — Assessment & Plan Note (Signed)
Jacqueline Washington presents today for further evaluation of some dyspnea. 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 She also has moderate to severe obstruction.  I suspect that her leg swelling is due to right-sided heart failure due to pulmonary hypertension. This is also secondary to her severe COPD.  Her volume status seems to be fairly well controlled. At this point I don't have any additional recommendations. We'll get an echocardiogram for further evaluation of her cardiac status.

## 2012-02-02 NOTE — Progress Notes (Signed)
Jacqueline Washington Date of Birth  [birth year]       [facility]    [place] [address]  [address] [place], [place]  [address]   [place], [place]  [address] [phone]     [phone]   Fax  [phone]    Fax [phone]  Problem List: 1. Hypertension 2. Hypothyroidism 3. Hyperlipidemia 4. Diabetes mellitus 5. Rheumatoid arthritis 6. Obesity 7. Pulmonary nodules   History of Present Illness:  Jacqueline Washington is a 61 yo with the above noted medical problems.  She has some dyspnea for 20+ years.  She attributes the dyspnea to her COPD.  She has had progressive leg swelling.  She is on Metalazone on a daily basis.  She takes Lasix occasionally when she has leg swelling.  Current Outpatient Prescriptions on File Prior to Visit  Medication Sig Dispense Refill  . allopurinol (ZYLOPRIM) 100 MG tablet Take 1 tablet by mouth Daily.      . Fluticasone-Salmeterol (ADVAIR DISKUS) 500-50 MCG/DOSE AEPB Inhale 1 puff into the lungs every 12 (twelve) hours.      . folic acid (FOLVITE) 1 MG tablet Take 1 mg by mouth daily.      . furosemide (LASIX) 40 MG tablet Take 1 tablet by mouth daily.       Marland Kitchen KLOR-CON M20 20 MEQ tablet Take 6 tablets by mouth daily.       Marland Kitchen levothyroxine (SYNTHROID, LEVOTHROID) 50 MCG tablet Take 1 tablet by mouth Daily.      . metFORMIN (GLUCOPHAGE) 500 MG tablet Take 0.5 tablets by mouth Daily.      . methotrexate (RHEUMATREX) 2.5 MG tablet Take 6 tablets by mouth Once a week.      . metolazone (ZAROXOLYN) 5 MG tablet Take 1 tablet by mouth Daily.      Marland Kitchen SPIRIVA HANDIHALER 18 MCG inhalation capsule Place 1 capsule into inhaler and inhale Daily.      Marland Kitchen DISCONTD: cetirizine (ZYRTEC) 10 MG tablet Take 10 mg by mouth daily.        Allergies  Allergen Reactions  . Penicillins   . Sulfonamide Derivatives   . Tetanus Toxoid     Past Medical History  Diagnosis Date  . Asthma   . Emphysema   . Rheumatoid arthritis   .  DM (diabetes mellitus)     Past Surgical History  Procedure Date  . Eye surgery at age 57    History  Smoking status  . Former Smoker -- 3.0 packs/day for 43 years  . Types: Cigarettes  . Quit date: 04/24/2007  Smokeless tobacco  . Never Used    History  Alcohol Use  . Yes    occasional    Family History  Problem Relation Age of Onset  . Asthma Mother   . Heart disease Mother   . Clotting disorder Mother     Reviw of Systems:  Reviewed in the HPI.  All other systems are negative.  Physical Exam: Blood pressure 140/82, pulse 71, height 5\' 4"  (1.626 m), weight 225 lb 12.8 oz (102.422 kg). General: Well developed, well nourished, in no acute distress.  Head: Normocephalic, atraumatic, sclera non-icteric, mucus membranes are moist,   Neck: Supple. Carotids are 2 + without bruits. No JVD  Lungs: She had mild wheezing and some rhonchi. She does not have any rales.  Heart: regular rate.  normal  S1 S2. No murmurs, gallops or rubs.  Abdomen: Soft,  non-tender, non-distended with normal bowel sounds. No hepatomegaly. No rebound/guarding. No masses.  Msk:  Strength and tone are normal  Extremities: No clubbing or cyanosis. Trace edema  Distal pedal pulses are 2+ and equal bilaterally.  Neuro: Alert and oriented X 3. Moves all extremities spontaneously.  Psych:  Responds to questions appropriately with a normal affect.  ECG: 02/02/2012-normal sinus rhythm at 71 beats a minute. She has nonspecific ST and T wave changes.  Assessment / Plan:

## 2012-02-02 NOTE — Patient Instructions (Signed)
Your physician has requested that you have an echocardiogram. Echocardiography is a painless test that uses sound waves to create images of your heart. It provides your doctor with information about the size and shape of your heart and how well your heart's chambers and valves are working. This procedure takes approximately one hour. There are no restrictions for this procedure.  Your physician wants you to follow-up in: 6 MONTHS WITH O2 SAT You will receive a reminder letter in the mail two months in advance. If you don't receive a letter, please call our office to schedule the follow-up appointment.

## 2012-02-05 ENCOUNTER — Ambulatory Visit (HOSPITAL_COMMUNITY): Payer: Medicare Other | Attending: Cardiology | Admitting: Radiology

## 2012-02-05 DIAGNOSIS — R600 Localized edema: Secondary | ICD-10-CM

## 2012-02-05 DIAGNOSIS — R0989 Other specified symptoms and signs involving the circulatory and respiratory systems: Secondary | ICD-10-CM | POA: Insufficient documentation

## 2012-02-05 DIAGNOSIS — I079 Rheumatic tricuspid valve disease, unspecified: Secondary | ICD-10-CM | POA: Insufficient documentation

## 2012-02-05 DIAGNOSIS — R0600 Dyspnea, unspecified: Secondary | ICD-10-CM

## 2012-02-05 DIAGNOSIS — R0602 Shortness of breath: Secondary | ICD-10-CM

## 2012-02-05 DIAGNOSIS — E119 Type 2 diabetes mellitus without complications: Secondary | ICD-10-CM | POA: Insufficient documentation

## 2012-02-05 DIAGNOSIS — J4489 Other specified chronic obstructive pulmonary disease: Secondary | ICD-10-CM | POA: Insufficient documentation

## 2012-02-05 DIAGNOSIS — J449 Chronic obstructive pulmonary disease, unspecified: Secondary | ICD-10-CM | POA: Insufficient documentation

## 2012-02-05 DIAGNOSIS — Z87891 Personal history of nicotine dependence: Secondary | ICD-10-CM | POA: Insufficient documentation

## 2012-02-05 DIAGNOSIS — I059 Rheumatic mitral valve disease, unspecified: Secondary | ICD-10-CM | POA: Insufficient documentation

## 2012-02-05 DIAGNOSIS — R0609 Other forms of dyspnea: Secondary | ICD-10-CM | POA: Insufficient documentation

## 2012-02-05 DIAGNOSIS — I10 Essential (primary) hypertension: Secondary | ICD-10-CM | POA: Insufficient documentation

## 2012-02-05 NOTE — Progress Notes (Signed)
Echocardiogram performed.  

## 2012-02-25 ENCOUNTER — Ambulatory Visit
Admission: RE | Admit: 2012-02-25 | Discharge: 2012-02-25 | Disposition: A | Discharge status: Home / Self Care | Payer: No Typology Code available for payment source | Source: Ambulatory Visit | Attending: Rheumatology | Admitting: Rheumatology

## 2012-02-25 DIAGNOSIS — M069 Rheumatoid arthritis, unspecified: Secondary | ICD-10-CM

## 2012-02-25 MED ORDER — GADOBENATE DIMEGLUMINE 529 MG/ML IV SOLN
20.0000 mL | Freq: Once | INTRAVENOUS | Status: AC | PRN
  Administered 2012-02-25: 20 mL via INTRAVENOUS

## 2012-03-03 ENCOUNTER — Ambulatory Visit (INDEPENDENT_AMBULATORY_CARE_PROVIDER_SITE_OTHER): Payer: Medicare Other | Admitting: Critical Care Medicine

## 2012-03-03 ENCOUNTER — Encounter: Payer: Self-pay | Admitting: Critical Care Medicine

## 2012-03-03 VITALS — BP 130/80 | HR 67 | Temp 97.8°F | Ht 64.0 in | Wt 235.0 lb

## 2012-03-03 DIAGNOSIS — J449 Chronic obstructive pulmonary disease, unspecified: Secondary | ICD-10-CM

## 2012-03-03 DIAGNOSIS — Z23 Encounter for immunization: Secondary | ICD-10-CM

## 2012-03-03 DIAGNOSIS — J4489 Other specified chronic obstructive pulmonary disease: Secondary | ICD-10-CM

## 2012-03-03 MED ORDER — TIOTROPIUM BROMIDE MONOHYDRATE 18 MCG IN CAPS: 1.0000 | ORAL_CAPSULE | Freq: Every day | RESPIRATORY_TRACT | Status: DC

## 2012-03-03 NOTE — Patient Instructions (Addendum)
Pneumovax and flu vaccine today Stop advair Stay on spiriva, use samples until you can refill Return 6 months

## 2012-03-03 NOTE — Progress Notes (Signed)
Subjective:    Patient ID: Jacqueline Washington, female    DOB: [birth year], 61 y.o.   MRN: [id number]  HPI  61 y.o. WF referred for abn CT Pt noted onset bad URI onset end 05/2011.  Noted a cough, no fever.  Noted chest pain ant chest area. Noted more dyspnea with minimal exertion.  Rx Abx doxy, prednisone.  CXR abnormal.  CT abn but PET scan did not show uptake.   Then got Zpak and pred and this helped more. Now currently: is still dyspneic.  Notes wheeze with walking. Pt denies back to baseline. No real coughing.  Pt notes light headed and nauseated.  Notes dysphagia. No heartburn No fever now.  08/12/11 K level is better.  No cough now.  Fungal study:  Pos aspergillous antibody. Now no wheeze.  Cellulitis is better Pt denies any significant sore throat, nasal congestion or excess secretions, fever, chills, sweats, unintended weight loss, pleurtic or exertional chest pain, orthopnea PND, or leg swelling Pt denies any increase in rescue therapy over baseline, denies waking up needing it or having any early am or nocturnal exacerbations of coughing/wheezing/or dyspnea. Pt also denies any obvious fluctuation in symptoms with  weather or environmental change or other alleviating or aggravating factors   03/03/2012 Cannot afford the copay on advair/spiriva Now no real cough. Notes some wheezing.  No real chest pain.  Pt notes if walks a lot will give out. Notes some edema in feet.   Pt denies any significant sore throat, nasal congestion or excess secretions, fever, chills, sweats, unintended weight loss, pleurtic or exertional chest pain, orthopnea PND, or leg swelling Pt denies any increase in rescue therapy over baseline, denies waking up needing it or having any early am or nocturnal exacerbations of coughing/wheezing/or dyspnea. Pt also denies any obvious fluctuation in symptoms with  weather or environmental change or other alleviating or aggravating factors     Past Medical History    Diagnosis Date  . Asthma   . Emphysema   . Rheumatoid arthritis   . DM (diabetes mellitus)      Family History  Problem Relation Age of Onset  . Asthma Mother   . Heart disease Mother   . Clotting disorder Mother      History   Social History  . Marital Status: Married    Spouse Name: N/A    Number of Children: 2  . Years of Education: N/A   Occupational History  .     Social History Main Topics  . Smoking status: Former Smoker -- 3.0 packs/day for 43 years    Types: Cigarettes    Quit date: 04/24/2007  . Smokeless tobacco: Never Used  . Alcohol Use: Yes     occasional  . Drug Use: No  . Sexually Active: Not on file   Other Topics Concern  . Not on file   Social History Narrative  . No narrative on file     Allergies  Allergen Reactions  . Penicillins   . Sulfonamide Derivatives   . Tetanus Toxoid      Outpatient Prescriptions Prior to Visit  Medication Sig Dispense Refill  . allopurinol (ZYLOPRIM) 100 MG tablet Take 1 tablet by mouth Daily.      . folic acid (FOLVITE) 1 MG tablet Take 2 mg by mouth daily.       . furosemide (LASIX) 40 MG tablet Take 1 tablet by mouth daily as needed.       Marland Kitchen KLOR-CON  M20 20 MEQ tablet Take 3 tablets by mouth daily. And 6 tablets daily when taking lasix      . methotrexate (RHEUMATREX) 2.5 MG tablet Take 6 tablets by mouth Once a week.      . metolazone (ZAROXOLYN) 5 MG tablet Take 1 tablet by mouth Daily.      . Fluticasone-Salmeterol (ADVAIR DISKUS) 500-50 MCG/DOSE AEPB Inhale 1 puff into the lungs every 12 (twelve) hours.      Marland Kitchen levothyroxine (SYNTHROID, LEVOTHROID) 50 MCG tablet Take 1 tablet by mouth Daily.      . metFORMIN (GLUCOPHAGE) 500 MG tablet Take 0.5 tablets by mouth Daily.      Marland Kitchen SPIRIVA HANDIHALER 18 MCG inhalation capsule Place 1 capsule into inhaler and inhale Daily.          Review of Systems  Constitutional: Positive for activity change, appetite change and fatigue. Negative for fever, chills,  diaphoresis and unexpected weight change.  HENT: Positive for trouble swallowing and voice change. Negative for hearing loss, ear pain, nosebleeds, congestion, sore throat, facial swelling, rhinorrhea, sneezing, mouth sores, neck pain, neck stiffness, dental problem, postnasal drip, sinus pressure, tinnitus and ear discharge.   Eyes: Positive for visual disturbance. Negative for photophobia, discharge and itching.  Respiratory: Positive for chest tightness, shortness of breath and wheezing. Negative for apnea, cough, choking and stridor.   Cardiovascular: Positive for leg swelling. Negative for chest pain and palpitations.  Gastrointestinal: Negative for nausea, vomiting, abdominal pain, constipation, blood in stool and abdominal distention.  Genitourinary: Negative for dysuria, urgency, frequency, hematuria, flank pain, decreased urine volume and difficulty urinating.  Musculoskeletal: Positive for back pain, arthralgias and gait problem. Negative for myalgias and joint swelling.  Skin: Negative for color change, pallor and rash.  Neurological: Positive for dizziness, weakness and light-headedness. Negative for tremors, seizures, syncope, speech difficulty, numbness and headaches.  Hematological: Negative for adenopathy. Bruises/bleeds easily.  Psychiatric/Behavioral: Positive for confusion. Negative for disturbed wake/sleep cycle and agitation. The patient is not nervous/anxious.        Objective:   Physical Exam  Filed Vitals:   03/03/12 1105  BP: 130/80  Pulse: 67  Temp: 97.8 F (36.6 C)  TempSrc: Oral  Height: 5\' 4"  (1.626 m)  Weight: 235 lb (106.595 kg)  SpO2: 93%    Gen: Pleasant, well-nourished, in no distress,  normal affect  ENT: No lesions,  mouth clear,  oropharynx clear, no postnasal drip  Neck: No JVD, no TMG, no carotid bruits  Lungs: No use of accessory muscles, no dullness to percussion, distant breath sounds   Cardiovascular: RRR, heart sounds normal, no murmur  or gallops, no peripheral edema  Abdomen: soft and NT, no HSM,  BS normal  Musculoskeletal: No deformities, no cyanosis or clubbing  Neuro: alert, non focal  Skin clear        Assessment & Plan:   OBSTRUCTIVE CHRONIC BRONCHITIS WITHOUT EXACERBAT Asthmatic bronchitis  Copd/Asthma components Ex smoker  Unable to afford two inhalers Plan Trial on monotherapy for spiriva d/t increase med costs D/c advair Flu vaccine 03/03/2012    Note chest x-ray obtained today does not show evidence of active disease process Updated Medication List Outpatient Encounter Prescriptions as of 03/03/2012  Medication Sig Dispense Refill  . allopurinol (ZYLOPRIM) 100 MG tablet Take 1 tablet by mouth Daily.      . folic acid (FOLVITE) 1 MG tablet Take 2 mg by mouth daily.       . furosemide (LASIX) 40 MG tablet Take  1 tablet by mouth daily as needed.       Marland Kitchen KLOR-CON M20 20 MEQ tablet Take 3 tablets by mouth daily. And 6 tablets daily when taking lasix      . methotrexate (RHEUMATREX) 2.5 MG tablet Take 6 tablets by mouth Once a week.      . metolazone (ZAROXOLYN) 5 MG tablet Take 1 tablet by mouth Daily.      . montelukast (SINGULAIR) 10 MG tablet Take 1 tablet by mouth daily.      . STUDY MEDICATION VX105 study drug for rheumatoid arthritis 150 mg daily      . tiotropium (SPIRIVA HANDIHALER) 18 MCG inhalation capsule Place 1 capsule (18 mcg total) into inhaler and inhale daily.  30 capsule  11  . DISCONTD: Fluticasone-Salmeterol (ADVAIR DISKUS) 500-50 MCG/DOSE AEPB Inhale 1 puff into the lungs every 12 (twelve) hours.      Marland Kitchen DISCONTD: levothyroxine (SYNTHROID, LEVOTHROID) 50 MCG tablet Take 1 tablet by mouth Daily.      Marland Kitchen DISCONTD: metFORMIN (GLUCOPHAGE) 500 MG tablet Take 0.5 tablets by mouth Daily.      Marland Kitchen DISCONTD: SPIRIVA HANDIHALER 18 MCG inhalation capsule Place 1 capsule into inhaler and inhale Daily.

## 2012-03-04 ENCOUNTER — Encounter: Payer: Self-pay | Admitting: Gastroenterology

## 2012-03-04 NOTE — Assessment & Plan Note (Addendum)
Asthmatic bronchitis  Copd/Asthma components Ex smoker  Unable to afford two inhalers Plan Trial on monotherapy for spiriva d/t increase med costs D/c advair Flu vaccine 03/03/2012

## 2012-03-31 ENCOUNTER — Encounter: Payer: Self-pay | Admitting: Gastroenterology

## 2012-03-31 ENCOUNTER — Other Ambulatory Visit (HOSPITAL_COMMUNITY): Payer: Self-pay | Admitting: Internal Medicine

## 2012-03-31 DIAGNOSIS — Z1231 Encounter for screening mammogram for malignant neoplasm of breast: Secondary | ICD-10-CM

## 2012-04-14 ENCOUNTER — Encounter (HOSPITAL_BASED_OUTPATIENT_CLINIC_OR_DEPARTMENT_OTHER): Payer: Medicare Other | Attending: General Surgery

## 2012-04-14 DIAGNOSIS — L089 Local infection of the skin and subcutaneous tissue, unspecified: Secondary | ICD-10-CM | POA: Insufficient documentation

## 2012-04-14 DIAGNOSIS — L97909 Non-pressure chronic ulcer of unspecified part of unspecified lower leg with unspecified severity: Secondary | ICD-10-CM | POA: Insufficient documentation

## 2012-04-15 ENCOUNTER — Ambulatory Visit (HOSPITAL_COMMUNITY)
Admission: RE | Admit: 2012-04-15 | Discharge: 2012-04-15 | Disposition: A | Discharge status: Home / Self Care | Payer: Medicare Other | Source: Ambulatory Visit | Attending: Internal Medicine | Admitting: Internal Medicine

## 2012-04-15 DIAGNOSIS — Z1231 Encounter for screening mammogram for malignant neoplasm of breast: Secondary | ICD-10-CM | POA: Insufficient documentation

## 2012-04-21 ENCOUNTER — Encounter (HOSPITAL_BASED_OUTPATIENT_CLINIC_OR_DEPARTMENT_OTHER): Payer: Medicare Other | Attending: General Surgery

## 2012-04-21 DIAGNOSIS — I87319 Chronic venous hypertension (idiopathic) with ulcer of unspecified lower extremity: Secondary | ICD-10-CM | POA: Insufficient documentation

## 2012-04-21 DIAGNOSIS — L97909 Non-pressure chronic ulcer of unspecified part of unspecified lower leg with unspecified severity: Secondary | ICD-10-CM | POA: Insufficient documentation

## 2012-05-05 ENCOUNTER — Ambulatory Visit (AMBULATORY_SURGERY_CENTER): Payer: Medicare Other | Admitting: *Deleted

## 2012-05-05 VITALS — Ht 64.0 in | Wt 227.8 lb

## 2012-05-05 DIAGNOSIS — Z1211 Encounter for screening for malignant neoplasm of colon: Secondary | ICD-10-CM

## 2012-05-05 DIAGNOSIS — Z86010 Personal history of colonic polyps: Secondary | ICD-10-CM

## 2012-05-05 MED ORDER — MOVIPREP 100 G PO SOLR: ORAL | Status: DC

## 2012-05-05 NOTE — Progress Notes (Signed)
Patient instructed to bring inhaler with her to colonoscopy. She states understanding. [name]

## 2012-05-12 ENCOUNTER — Encounter (HOSPITAL_BASED_OUTPATIENT_CLINIC_OR_DEPARTMENT_OTHER): Payer: Medicare Other

## 2012-05-28 ENCOUNTER — Encounter: Payer: Self-pay | Admitting: Gastroenterology

## 2012-05-28 ENCOUNTER — Ambulatory Visit (AMBULATORY_SURGERY_CENTER): Payer: Medicare Other | Admitting: Gastroenterology

## 2012-05-28 VITALS — BP 155/78 | HR 75 | Temp 98.0°F | Resp 21 | Ht 64.0 in | Wt 227.0 lb

## 2012-05-28 DIAGNOSIS — Z860100 Personal history of colon polyps, unspecified: Secondary | ICD-10-CM

## 2012-05-28 DIAGNOSIS — Z1211 Encounter for screening for malignant neoplasm of colon: Secondary | ICD-10-CM

## 2012-05-28 LAB — GLUCOSE, CAPILLARY
Glucose-Capillary: 95 mg/dL (ref 70–99)
Glucose-Capillary: 76 mg/dL (ref 70–99)
Glucose-Capillary: 72 mg/dL (ref 70–99)

## 2012-05-28 MED ORDER — SODIUM CHLORIDE 0.9 % IV SOLN: 500.0000 mL | INTRAVENOUS | Status: DC

## 2012-05-28 NOTE — Op Note (Signed)
[facility] [address] [place], [address]   COLONOSCOPY PROCEDURE REPORT  PATIENT: Jacqueline Washington, Jacqueline Washington  MR#: [id number] BIRTHDATE: [birth year] , 61  yrs. old GENDER: Female ENDOSCOPIST: Rachael Fee, MD PROCEDURE DATE:  05/28/2012 PROCEDURE:   Colonoscopy, diagnostic ASA CLASS:   Class III INDICATIONS:Three small adenomas removed 2008. MEDICATIONS: Fentanyl 50 mcg IV, Versed 5 mg IV, and These medications were titrated to patient response per physician's verbal order  DESCRIPTION OF PROCEDURE:   After the risks benefits and alternatives of the procedure were thoroughly explained, informed consent was obtained.  A digital rectal exam revealed no abnormalities of the rectum.   The LB CF-Q180AL [id number] and LB [id number]-H180AL [id number]  endoscope was introduced through the anus and advanced to the cecum, which was identified by the ileocecal valve. No adverse events experienced.   The quality of the prep was Moviprep fair  The instrument was then slowly withdrawn as the colon was fully examined.  COLON FINDINGS: A normal appearing cecum, ileocecal valve, and appendiceal orifice were identified.  The ascending, hepatic flexure, transverse, splenic flexure, descending, sigmoid colon and rectum appeared unremarkable.  No polyps or cancers were seen. Retroflexed views revealed no abnormalities. The time to cecum=10 minutes 31 seconds.  Withdrawal time=6 minutes 06 seconds.  The scope was withdrawn and the procedure completed. COMPLICATIONS: There were no complications.  ENDOSCOPIC IMPRESSION: Normal colon; no polyps or cancers  RECOMMENDATIONS: Given your personal history of adenomatous (pre-cancerous) polyps, you will need a repeat colonoscopy in 5 years.   eSigned:  Rachael Fee, MD 05/28/2012 11:13 AM

## 2012-05-28 NOTE — Patient Instructions (Addendum)
Discharge instructions given with verbal understanding. Normal exam. Resume previous medications. YOU HAD AN ENDOSCOPIC PROCEDURE TODAY AT THE [facility]: Refer to the procedure report that was given to you for any specific questions about what was found during the examination.  If the procedure report does not answer your questions, please call your gastroenterologist to clarify.  If you requested that your care partner not be given the details of your procedure findings, then the procedure report has been included in a sealed envelope for you to review at your convenience later.  YOU SHOULD EXPECT: Some feelings of bloating in the abdomen. Passage of more gas than usual.  Walking can help get rid of the air that was put into your GI tract during the procedure and reduce the bloating. If you had a lower endoscopy (such as a colonoscopy or flexible sigmoidoscopy) you may notice spotting of blood in your stool or on the toilet paper. If you underwent a bowel prep for your procedure, then you may not have a normal bowel movement for a few days.  DIET: Your first meal following the procedure should be a light meal and then it is ok to progress to your normal diet.  A half-sandwich or bowl of soup is an example of a good first meal.  Heavy or fried foods are harder to digest and may make you feel nauseous or bloated.  Likewise meals heavy in dairy and vegetables can cause extra gas to form and this can also increase the bloating.  Drink plenty of fluids but you should avoid alcoholic beverages for 24 hours.  ACTIVITY: Your care partner should take you home directly after the procedure.  You should plan to take it easy, moving slowly for the rest of the day.  You can resume normal activity the day after the procedure however you should NOT DRIVE or use heavy machinery for 24 hours (because of the sedation medicines used during the test).    SYMPTOMS TO REPORT IMMEDIATELY: A gastroenterologist  can be reached at any hour.  During normal business hours, 8:30 AM to 5:00 PM Monday through Friday, call [phone].  After hours and on weekends, please call the GI answering service at [phone] who will take a message and have the physician on call contact you.   Following lower endoscopy (colonoscopy or flexible sigmoidoscopy):  Excessive amounts of blood in the stool  Significant tenderness or worsening of abdominal pains  Swelling of the abdomen that is new, acute  Fever of 100F or higher  FOLLOW UP: If any biopsies were taken you will be contacted by phone or by letter within the next 1-3 weeks.  Call your gastroenterologist if you have not heard about the biopsies in 3 weeks.  Our staff will call the home number listed on your records the next business day following your procedure to check on you and address any questions or concerns that you may have at that time regarding the information given to you following your procedure. This is a courtesy call and so if there is no answer at the home number and we have not heard from you through the emergency physician on call, we will assume that you have returned to your regular daily activities without incident.  SIGNATURES/CONFIDENTIALITY: You and/or your care partner have signed paperwork which will be entered into your electronic medical record.  These signatures attest to the fact that that the information above on your After Visit Summary has been reviewed   and is understood.  Full responsibility of the confidentiality of this discharge information lies with you and/or your care-partner. 

## 2012-05-28 NOTE — Progress Notes (Signed)
Patient did not experience any of the following events: a burn prior to discharge; a fall within the facility; wrong site/side/patient/procedure/implant event; or a hospital transfer or hospital admission upon discharge from the facility. (G8907) Patient did not have preoperative order for IV antibiotic SSI prophylaxis. (G8918)  

## 2012-05-31 ENCOUNTER — Telehealth: Payer: Self-pay | Admitting: *Deleted

## 2012-05-31 NOTE — Telephone Encounter (Signed)
  Follow up Call-  Call back number 05/28/2012  Post procedure Call Back phone  # [phone]  Permission to leave phone message Yes     Patient questions:  Do you have a fever, pain , or abdominal swelling? no Pain Score  0 *  Have you tolerated food without any problems? yes  Have you been able to return to your normal activities? yes  Do you have any questions about your discharge instructions: Diet   no Medications  no Follow up visit  no  Do you have questions or concerns about your Care? no  Actions: * If pain score is 4 or above: No action needed, pain <4.

## 2012-08-24 ENCOUNTER — Telehealth: Payer: Self-pay | Admitting: Critical Care Medicine

## 2012-08-24 NOTE — Telephone Encounter (Signed)
Attempted to call pt x 3 to make next ov per recall.  Pt never returned calls.  Mailed recall letter 08/25/12. Emily E McAlister °

## 2012-09-14 ENCOUNTER — Other Ambulatory Visit (HOSPITAL_COMMUNITY): Payer: Self-pay | Admitting: Physician Assistant

## 2012-09-14 ENCOUNTER — Ambulatory Visit (HOSPITAL_COMMUNITY)
Admission: RE | Admit: 2012-09-14 | Discharge: 2012-09-14 | Disposition: A | Discharge status: Home / Self Care | Payer: Medicare Other | Source: Ambulatory Visit | Attending: Internal Medicine | Admitting: Internal Medicine

## 2012-09-14 ENCOUNTER — Other Ambulatory Visit (HOSPITAL_COMMUNITY): Payer: Self-pay | Admitting: Internal Medicine

## 2012-09-14 DIAGNOSIS — J449 Chronic obstructive pulmonary disease, unspecified: Secondary | ICD-10-CM

## 2012-09-14 DIAGNOSIS — R0602 Shortness of breath: Secondary | ICD-10-CM | POA: Insufficient documentation

## 2012-09-14 DIAGNOSIS — Z87891 Personal history of nicotine dependence: Secondary | ICD-10-CM | POA: Insufficient documentation

## 2012-09-14 DIAGNOSIS — R062 Wheezing: Secondary | ICD-10-CM | POA: Insufficient documentation

## 2012-09-14 DIAGNOSIS — R0989 Other specified symptoms and signs involving the circulatory and respiratory systems: Secondary | ICD-10-CM | POA: Insufficient documentation

## 2012-09-14 DIAGNOSIS — R05 Cough: Secondary | ICD-10-CM | POA: Insufficient documentation

## 2012-09-14 DIAGNOSIS — R059 Cough, unspecified: Secondary | ICD-10-CM | POA: Insufficient documentation

## 2012-09-20 ENCOUNTER — Encounter (HOSPITAL_COMMUNITY): Payer: Self-pay | Admitting: Nurse Practitioner

## 2012-09-20 ENCOUNTER — Inpatient Hospital Stay (HOSPITAL_COMMUNITY)
Admission: EM | Admit: 2012-09-20 | Discharge: 2012-09-23 | DRG: 190 | Disposition: A | Discharge status: Home Health | Payer: Medicare Other | Attending: Internal Medicine | Admitting: Internal Medicine

## 2012-09-20 ENCOUNTER — Emergency Department (HOSPITAL_COMMUNITY): Payer: Medicare Other

## 2012-09-20 DIAGNOSIS — R600 Localized edema: Secondary | ICD-10-CM

## 2012-09-20 DIAGNOSIS — I10 Essential (primary) hypertension: Secondary | ICD-10-CM | POA: Diagnosis present

## 2012-09-20 DIAGNOSIS — J45901 Unspecified asthma with (acute) exacerbation: Principal | ICD-10-CM | POA: Diagnosis present

## 2012-09-20 DIAGNOSIS — R918 Other nonspecific abnormal finding of lung field: Secondary | ICD-10-CM

## 2012-09-20 DIAGNOSIS — Z882 Allergy status to sulfonamides status: Secondary | ICD-10-CM

## 2012-09-20 DIAGNOSIS — Z79899 Other long term (current) drug therapy: Secondary | ICD-10-CM

## 2012-09-20 DIAGNOSIS — I509 Heart failure, unspecified: Secondary | ICD-10-CM | POA: Diagnosis present

## 2012-09-20 DIAGNOSIS — I5030 Unspecified diastolic (congestive) heart failure: Secondary | ICD-10-CM

## 2012-09-20 DIAGNOSIS — R0600 Dyspnea, unspecified: Secondary | ICD-10-CM

## 2012-09-20 DIAGNOSIS — J449 Chronic obstructive pulmonary disease, unspecified: Secondary | ICD-10-CM | POA: Diagnosis present

## 2012-09-20 DIAGNOSIS — J4489 Other specified chronic obstructive pulmonary disease: Secondary | ICD-10-CM

## 2012-09-20 DIAGNOSIS — Z825 Family history of asthma and other chronic lower respiratory diseases: Secondary | ICD-10-CM

## 2012-09-20 DIAGNOSIS — E079 Disorder of thyroid, unspecified: Secondary | ICD-10-CM | POA: Diagnosis present

## 2012-09-20 DIAGNOSIS — E039 Hypothyroidism, unspecified: Secondary | ICD-10-CM | POA: Diagnosis present

## 2012-09-20 DIAGNOSIS — I5033 Acute on chronic diastolic (congestive) heart failure: Secondary | ICD-10-CM

## 2012-09-20 DIAGNOSIS — E119 Type 2 diabetes mellitus without complications: Secondary | ICD-10-CM | POA: Diagnosis present

## 2012-09-20 DIAGNOSIS — Z87891 Personal history of nicotine dependence: Secondary | ICD-10-CM

## 2012-09-20 DIAGNOSIS — M069 Rheumatoid arthritis, unspecified: Secondary | ICD-10-CM | POA: Diagnosis present

## 2012-09-20 DIAGNOSIS — J441 Chronic obstructive pulmonary disease with (acute) exacerbation: Principal | ICD-10-CM

## 2012-09-20 DIAGNOSIS — L0390 Cellulitis, unspecified: Secondary | ICD-10-CM

## 2012-09-20 DIAGNOSIS — Z880 Allergy status to penicillin: Secondary | ICD-10-CM

## 2012-09-20 DIAGNOSIS — J3489 Other specified disorders of nose and nasal sinuses: Secondary | ICD-10-CM | POA: Diagnosis present

## 2012-09-20 DIAGNOSIS — J9600 Acute respiratory failure, unspecified whether with hypoxia or hypercapnia: Secondary | ICD-10-CM

## 2012-09-20 DIAGNOSIS — R21 Rash and other nonspecific skin eruption: Secondary | ICD-10-CM | POA: Diagnosis present

## 2012-09-20 DIAGNOSIS — E876 Hypokalemia: Secondary | ICD-10-CM

## 2012-09-20 DIAGNOSIS — D72829 Elevated white blood cell count, unspecified: Secondary | ICD-10-CM

## 2012-09-20 DIAGNOSIS — Z8249 Family history of ischemic heart disease and other diseases of the circulatory system: Secondary | ICD-10-CM

## 2012-09-20 DIAGNOSIS — J9601 Acute respiratory failure with hypoxia: Secondary | ICD-10-CM | POA: Diagnosis present

## 2012-09-20 DIAGNOSIS — Z887 Allergy status to serum and vaccine status: Secondary | ICD-10-CM

## 2012-09-20 HISTORY — DX: Emphysema, unspecified: J43.9

## 2012-09-20 LAB — CBC
MCH: 31.3 pg (ref 26.0–34.0)
MCHC: 33.7 g/dL (ref 30.0–36.0)
MCV: 91.5 fL (ref 78.0–100.0)
Platelets: 200 10*3/uL (ref 150–400)
Platelets: 191 10*3/uL (ref 150–400)
RBC: 4.73 MIL/uL (ref 3.87–5.11)
RBC: 4.67 MIL/uL (ref 3.87–5.11)
RDW: 14.6 % (ref 11.5–15.5)
WBC: 21.2 10*3/uL — ABNORMAL HIGH (ref 4.0–10.5)

## 2012-09-20 LAB — BASIC METABOLIC PANEL
CO2: 29 mEq/L (ref 19–32)
Chloride: 103 mEq/L (ref 96–112)
Creatinine, Ser: 0.73 mg/dL (ref 0.50–1.10)
GFR calc Af Amer: >90 mL/min (ref >90)
Sodium: 142 mEq/L (ref 135–145)

## 2012-09-20 LAB — PRO B NATRIURETIC PEPTIDE: Pro B Natriuretic peptide (BNP): 333.7 pg/mL — ABNORMAL HIGH (ref 0–125)

## 2012-09-20 LAB — TROPONIN I: Troponin I: <0.3 ng/mL (ref <0.30)

## 2012-09-20 LAB — POCT I-STAT TROPONIN I

## 2012-09-20 LAB — MRSA PCR SCREENING: MRSA by PCR: NEGATIVE

## 2012-09-20 LAB — CREATININE, SERUM: Creatinine, Ser: 0.77 mg/dL (ref 0.50–1.10)

## 2012-09-20 MED ORDER — TIOTROPIUM BROMIDE MONOHYDRATE 18 MCG IN CAPS
1.0000 | ORAL_CAPSULE | Freq: Every day | RESPIRATORY_TRACT | Status: DC
  Administered 2012-09-21 – 2012-09-23 (×3): 18 ug via RESPIRATORY_TRACT
  Filled 2012-09-20: qty 5

## 2012-09-20 MED ORDER — PREDNISONE 20 MG PO TABS
60.0000 mg | ORAL_TABLET | Freq: Once | ORAL | Status: AC
  Administered 2012-09-20: 60 mg via ORAL
  Filled 2012-09-20: qty 3

## 2012-09-20 MED ORDER — ONDANSETRON HCL 4 MG PO TABS: 4.0000 mg | ORAL_TABLET | Freq: Four times a day (QID) | ORAL | Status: DC | PRN

## 2012-09-20 MED ORDER — ACETAMINOPHEN 325 MG PO TABS: 650.0000 mg | ORAL_TABLET | Freq: Four times a day (QID) | ORAL | Status: DC | PRN

## 2012-09-20 MED ORDER — ALBUTEROL SULFATE (5 MG/ML) 0.5% IN NEBU
INHALATION_SOLUTION | RESPIRATORY_TRACT | Status: AC
  Administered 2012-09-20: 5 mg via RESPIRATORY_TRACT
  Filled 2012-09-20: qty 1

## 2012-09-20 MED ORDER — ONDANSETRON HCL 4 MG/2ML IJ SOLN: 4.0000 mg | Freq: Four times a day (QID) | INTRAMUSCULAR | Status: DC | PRN

## 2012-09-20 MED ORDER — IPRATROPIUM BROMIDE 0.02 % IN SOLN
RESPIRATORY_TRACT | Status: AC
  Administered 2012-09-20: 0.5 mg via RESPIRATORY_TRACT
  Filled 2012-09-20: qty 2.5

## 2012-09-20 MED ORDER — HEPARIN SODIUM (PORCINE) 5000 UNIT/ML IJ SOLN
5000.0000 [IU] | Freq: Three times a day (TID) | INTRAMUSCULAR | Status: DC
  Administered 2012-09-20 – 2012-09-23 (×8): 5000 [IU] via SUBCUTANEOUS
  Filled 2012-09-20 (×11): qty 1

## 2012-09-20 MED ORDER — HYDROCOD POLST-CHLORPHEN POLST 10-8 MG/5ML PO LQCR
5.0000 mL | Freq: Once | ORAL | Status: AC
  Administered 2012-09-20: 5 mL via ORAL
  Filled 2012-09-20: qty 5

## 2012-09-20 MED ORDER — METHYLPREDNISOLONE SODIUM SUCC 125 MG IJ SOLR: 60.0000 mg | Freq: Once | INTRAMUSCULAR | Status: DC

## 2012-09-20 MED ORDER — FOLIC ACID 1 MG PO TABS
2.0000 mg | ORAL_TABLET | Freq: Every day | ORAL | Status: DC
  Administered 2012-09-20 – 2012-09-23 (×4): 2 mg via ORAL
  Filled 2012-09-20 (×4): qty 2

## 2012-09-20 MED ORDER — POLYETHYLENE GLYCOL 3350 17 G PO PACK
17.0000 g | PACK | Freq: Every day | ORAL | Status: DC | PRN
  Filled 2012-09-20: qty 1

## 2012-09-20 MED ORDER — ALBUTEROL SULFATE (5 MG/ML) 0.5% IN NEBU
10.0000 mg | INHALATION_SOLUTION | Freq: Once | RESPIRATORY_TRACT | Status: AC
  Administered 2012-09-20: 10 mg via RESPIRATORY_TRACT
  Filled 2012-09-20: qty 2

## 2012-09-20 MED ORDER — DEXTROSE-NACL 5-0.45 % IV SOLN: INTRAVENOUS | Status: DC

## 2012-09-20 MED ORDER — LEVOTHYROXINE SODIUM 50 MCG PO TABS
50.0000 ug | ORAL_TABLET | Freq: Every day | ORAL | Status: DC
  Administered 2012-09-21 – 2012-09-23 (×3): 50 ug via ORAL
  Filled 2012-09-20 (×5): qty 1

## 2012-09-20 MED ORDER — SODIUM CHLORIDE 0.9 % IV SOLN: INTRAVENOUS | Status: DC

## 2012-09-20 MED ORDER — DOXYCYCLINE HYCLATE 100 MG IV SOLR
100.0000 mg | Freq: Once | INTRAVENOUS | Status: DC
  Administered 2012-09-20: 100 mg via INTRAVENOUS
  Filled 2012-09-20: qty 100

## 2012-09-20 MED ORDER — METHYLPREDNISOLONE SODIUM SUCC 125 MG IJ SOLR
80.0000 mg | Freq: Four times a day (QID) | INTRAMUSCULAR | Status: DC
  Administered 2012-09-20 – 2012-09-21 (×2): 80 mg via INTRAVENOUS
  Filled 2012-09-20 (×7): qty 1.28

## 2012-09-20 MED ORDER — ALBUTEROL SULFATE (5 MG/ML) 0.5% IN NEBU: 5.0000 mg | INHALATION_SOLUTION | Freq: Once | RESPIRATORY_TRACT | Status: AC

## 2012-09-20 MED ORDER — METHOTREXATE 2.5 MG PO TABS: 18.7500 mg | ORAL_TABLET | ORAL | Status: DC

## 2012-09-20 MED ORDER — ALBUTEROL SULFATE (5 MG/ML) 0.5% IN NEBU
2.5000 mg | INHALATION_SOLUTION | RESPIRATORY_TRACT | Status: DC
  Administered 2012-09-20 – 2012-09-23 (×12): 2.5 mg via RESPIRATORY_TRACT
  Filled 2012-09-20 (×14): qty 0.5

## 2012-09-20 MED ORDER — ACETAMINOPHEN 650 MG RE SUPP: 650.0000 mg | Freq: Four times a day (QID) | RECTAL | Status: DC | PRN

## 2012-09-20 MED ORDER — METHYLPREDNISOLONE SODIUM SUCC 125 MG IJ SOLR
80.0000 mg | Freq: Once | INTRAMUSCULAR | Status: AC
  Administered 2012-09-20: 80 mg via INTRAVENOUS
  Filled 2012-09-20: qty 2

## 2012-09-20 MED ORDER — MONTELUKAST SODIUM 10 MG PO TABS
10.0000 mg | ORAL_TABLET | Freq: Every day | ORAL | Status: DC
  Administered 2012-09-20 – 2012-09-23 (×4): 10 mg via ORAL
  Filled 2012-09-20 (×4): qty 1

## 2012-09-20 MED ORDER — ALLOPURINOL 100 MG PO TABS
100.0000 mg | ORAL_TABLET | Freq: Every day | ORAL | Status: DC
  Administered 2012-09-21 – 2012-09-23 (×3): 100 mg via ORAL
  Filled 2012-09-20 (×4): qty 1

## 2012-09-20 MED ORDER — SODIUM CHLORIDE 0.9 % IJ SOLN
3.0000 mL | Freq: Two times a day (BID) | INTRAMUSCULAR | Status: DC
  Administered 2012-09-21 – 2012-09-22 (×4): 3 mL via INTRAVENOUS

## 2012-09-20 MED ORDER — MOMETASONE FURO-FORMOTEROL FUM 100-5 MCG/ACT IN AERO
2.0000 | INHALATION_SPRAY | Freq: Two times a day (BID) | RESPIRATORY_TRACT | Status: DC
  Administered 2012-09-20 – 2012-09-23 (×6): 2 via RESPIRATORY_TRACT
  Filled 2012-09-20: qty 8.8

## 2012-09-20 MED ORDER — DOXYCYCLINE HYCLATE 100 MG IV SOLR
100.0000 mg | Freq: Two times a day (BID) | INTRAVENOUS | Status: DC
  Administered 2012-09-21: 100 mg via INTRAVENOUS
  Filled 2012-09-20 (×2): qty 100

## 2012-09-20 MED ORDER — HYDROCODONE-ACETAMINOPHEN 5-325 MG PO TABS: 1.0000 | ORAL_TABLET | ORAL | Status: DC | PRN

## 2012-09-20 MED ORDER — ALBUTEROL SULFATE (5 MG/ML) 0.5% IN NEBU: 2.5000 mg | INHALATION_SOLUTION | RESPIRATORY_TRACT | Status: DC | PRN

## 2012-09-20 MED ORDER — IPRATROPIUM BROMIDE 0.02 % IN SOLN
0.5000 mg | Freq: Once | RESPIRATORY_TRACT | Status: AC
Start: 2012-09-20 — End: 2012-09-20

## 2012-09-20 NOTE — H&P (Signed)
Jacqueline Washington History and Physical  [name] WGN:[id number] DOB: Apr 03, 1951 DOA: 09/20/2012  Referring physician: Dr. Adriana Simas PCP: Nadean Corwin, MD  Specialists: none  Chief Complaint: SOB  HPI: Jacqueline Washington is a 62 y.o. female  Past medical history of tobacco abuse, comes in for shortness of breath, started about 6 days prior to admission. She was started on prednisone taper and 6 day course of Levaquin by her PCP 6 days prior to admission. She went back to her primary care doctor the day of admission and her shortness of breath did not improve she was satting 90% on room air so she was sent here to the emergency room. She relates she can normally walk more than 100 feet without getting short of breath now she is short of breath sitting in bed. She's not able to speak in full sentences  Here in the emergency room she was given several treatments of albuterol, steroids and Atrovent with no improvement. She is satting 95% on 2 L and we were asked to admit and further evaluate.  Review of Systems: The patient denies anorexia, fever, weight loss,, vision loss, decreased hearing, hoarseness, chest pain, syncope,  peripheral edema, balance deficits, hemoptysis, abdominal pain, melena, hematochezia, severe indigestion/heartburn, hematuria, incontinence, genital sores, muscle weakness, suspicious skin lesions, transient blindness, difficulty walking, depression, unusual weight change, abnormal bleeding, enlarged lymph nodes, angioedema, and breast masses.    Past Medical History  Diagnosis Date  . Asthma   . Rheumatoid arthritis   . DM (diabetes mellitus)   . Thyroid disease   . Emphysema     pt denies any O2 use at this time/rm  . Emphysema    Past Surgical History  Procedure Laterality Date  . Eye surgery  at age 54  . Tubal ligation    . Colonoscopy     Social History:  reports that she quit smoking about 5 years ago. Her smoking use included Cigarettes. She has a 129  pack-year smoking history. She has never used smokeless tobacco. She reports that  drinks alcohol. She reports that she does not use illicit drugs. Lives at home with husband can perform all her ADLs  Allergies  Allergen Reactions  . Penicillins Rash  . Sulfonamide Derivatives Rash  . Tetanus Toxoid Other (See Comments)    Knot on arm    Family History  Problem Relation Age of Onset  . Asthma Mother   . Heart disease Mother   . Clotting disorder Mother   . Colon cancer Neg Hx   . Heart attack Father      Prior to Admission medications   Medication Sig Start Date End Date Taking? Authorizing Provider  albuterol (PROVENTIL) (2.5 MG/3ML) 0.083% nebulizer solution Take 2.5 mg by nebulization every 6 (six) hours as needed for wheezing.   Yes Historical Provider, MD  allopurinol (ZYLOPRIM) 100 MG tablet Take 1 tablet by mouth Daily.   Yes Historical Provider, MD  Ascorbic Acid (VITAMIN C) 1000 MG tablet Take 1,000 mg by mouth daily.   Yes Historical Provider, MD  Cholecalciferol (VITAMIN D-3) 5000 UNITS TABS Take 1 tablet by mouth daily.   Yes Historical Provider, MD  Cyanocobalamin (VITAMIN B12 PO) Take 1 tablet by mouth every Monday, Wednesday, and Friday.    Yes Historical Provider, MD  Fluticasone-Salmeterol (ADVAIR) 250-50 MCG/DOSE AEPB Inhale 1 puff into the lungs every 12 (twelve) hours.   Yes Historical Provider, MD  folic acid (FOLVITE) 1 MG tablet Take 2 mg by  mouth daily.    Yes Historical Provider, MD  furosemide (LASIX) 40 MG tablet Take 40 mg by mouth daily.    Yes Historical Provider, MD  KLOR-CON M20 20 MEQ tablet Take 60 tablets by mouth daily.    Yes Historical Provider, MD  levofloxacin (LEVAQUIN) 500 MG tablet Take 500 mg by mouth daily. Starting 09/14/12 for 10 days   Yes Historical Provider, MD  levothyroxine (SYNTHROID, LEVOTHROID) 50 MCG tablet Take 1 tablet by mouth Daily. 05/04/12  Yes Historical Provider, MD  MAGNESIUM-ZINC PO Take 2 tablets by mouth daily.    Yes  Historical Provider, MD  methotrexate (RHEUMATREX) 2.5 MG tablet Take 7.5 tablets by mouth Once a week. Thursday   Yes Historical Provider, MD  montelukast (SINGULAIR) 10 MG tablet Take 1 tablet by mouth daily.   Yes Historical Provider, MD  Multiple Vitamin (MULTIVITAMIN) capsule Take 1 capsule by mouth daily.   Yes Historical Provider, MD  predniSONE (DELTASONE) 20 MG tablet Take 20 mg by mouth See admin instructions. Starting 09/14/12 day 1 take 20 mg 3 times a day x 2 days; day 2 take 20 mg twice daily x 2 days, then take 20 mg daily for 5 days   Yes Historical Provider, MD  STUDY MEDICATION Take 1 tablet by mouth daily. VX105 study drug for rheumatoid arthritis 150 mg daily   Yes Historical Provider, MD  tiotropium (SPIRIVA HANDIHALER) 18 MCG inhalation capsule Place 1 capsule (18 mcg total) into inhaler and inhale daily. 03/03/12  Yes Storm Frisk, MD   Physical Exam: Filed Vitals:   09/20/12 1315 09/20/12 1350 09/20/12 1444 09/20/12 1522  BP:  162/69 153/85   Pulse: 108 101 98   Temp:      TempSrc:      Resp: 21 22 26    SpO2: 96% 97% 96% 96%    BP 153/85  Pulse 98  Temp(Src) 98.1 F (36.7 C) (Oral)  Resp 26  SpO2 96%  General Appearance:    Alert, cooperative, no distress, appears stated age  Head:    Normocephalic, without obvious abnormality, atraumatic  Eyes:    PERRL, conjunctiva/corneas clear, EOM's intact, fundi    benign, both eyes        Throat:   dry mucous membranes   Neck:   Supple, symmetrical, trachea midline, no adenopathy;    thyroid: Thick   Back:     Symmetric, no curvature, ROM normal, no CVA tenderness  Lungs:     she is moving poor air, with wheezing bilaterally. And crackles on her right lower lobe   Chest Wall:    No tenderness or deformity   Heart:    tachycardic and regular rhythm, S1 and S2 normal, no murmur, rub   or gallop     Abdomen:     Soft, non-tender, bowel sounds active all four quadrants,    no masses, no organomegaly         Extremities:   Extremities normal, atraumatic, no cyanosis or edema  Pulses:   2+ and symmetric all extremities  Skin:   Skin color, texture, turgor normal, no rashes or lesions  Lymph nodes:   Cervical, supraclavicular, and axillary nodes normal  Neurologic:   CNII-XII intact, normal strength, sensation and reflexes    throughout     Labs on Admission:  Basic Metabolic Panel:  Recent Labs Lab 09/20/12 1312  NA 142  K 3.9  CL 103  CO2 29  GLUCOSE 110*  BUN 20  CREATININE 0.73  CALCIUM 9.4   Liver Function Tests: No results found for this basename: AST, ALT, ALKPHOS, BILITOT, PROT, ALBUMIN,  in the last 168 hours No results found for this basename: LIPASE, AMYLASE,  in the last 168 hours No results found for this basename: AMMONIA,  in the last 168 hours CBC:  Recent Labs Lab 09/20/12 1312  WBC 21.2*  HGB 14.7  HCT 43.3  MCV 91.5  PLT 200   Cardiac Enzymes:  Recent Labs Lab 09/20/12 1314  TROPONINI <0.30    BNP (last 3 results)  Recent Labs  09/20/12 1316  PROBNP 333.7*   CBG: No results found for this basename: GLUCAP,  in the last 168 hours  Radiological Exams on Admission: Dg Chest 2 View  09/20/2012  *RADIOLOGY REPORT*  Clinical Data: Shortness of breath, cough, congestion, asthma  CHEST - 2 VIEW  Comparison: 09/14/2012  Findings: Cardiomediastinal silhouette is stable.  No acute infiltrate or pulmonary edema.  Stable chronic interstitial prominence.  Central mild bronchitic changes.  IMPRESSION:  No acute infiltrate or pulmonary edema.  Chronic interstitial prominence.  Central mild bronchitic changes.   Original Report Authenticated By: Natasha Mead, M.D.     EKG: Independently reviewed. Sinus tach cardia with mild ST segment depression in inferior leads. Assessment/Plan Active Problems:    Acute respiratory failure with hypoxia/OBSTRUCTIVE CHRONIC BRONCHITIS WITH EXACERBAT:  - She has failed outpatient treatment with steroids antibiotics and  inhaler. He the emergency room she cannot speak in full sentences, she is satting 95% on 2 L. Her breathing about 20-25 times per minute. She's currently conscious.   - I will admit her to step down, she is breathing quite fast and 20 times per minute, she is conscious and able to answer questions. Will go ahead and start her on BiPAP.   - We'll start her on high-dose steroids and IV antibiotics. We'll continue albuterol treatments. We'll also continue his breathing treatments. Chest x-ray does not show any infiltrates, she relates no fevers no fevers here in the hospital. She is wheezing quite loud and is moving poor air. She denies any ongoing tobacco abuse. She denies any abdominal pain or chest pain.    Leukocytosis: - This most likely is indicative steroids. I will continue these.  Hypertension: - I will go ahead and hold her diuretics. It should mouth is dry. We'll go ahead and start on gentle IV fluids and continue to monitor her blood pressure.   Code Status: full Family Communication: husband Disposition Plan: Step down 3-4 days  Time spent: 60 minutes  Marinda Elk Jacqueline Washington Pager [phone]  If 7PM-7AM, please contact night-coverage [email or web address] Password [facility] 09/20/2012, 4:16 PM

## 2012-09-20 NOTE — ED Notes (Signed)
PT completed Neb treatment after cough med .

## 2012-09-20 NOTE — ED Provider Notes (Signed)
History     CSN: [id number]  Arrival date & time 09/20/12  1151   First MD Initiated Contact with Patient 09/20/12 1206      Chief Complaint  Patient presents with  . Shortness of Breath    (Consider location/radiation/quality/duration/timing/severity/associated sxs/prior treatment) HPI Comments:  Jacqueline Washington is a 62 y.o. Female presenting with a history of COPD increasing shortness of breath,  Wheezing and occasional productive cough of a thick clear sputum.  She is currently on day 6 of levaquin and a prednisone taper,  Currently on 20 mg daily without relief.  She has increasing weakness, cough and shortness of breath with exertion, stating she cannot walk across the room currently without getting winded and lightheaded.  She has also been on home oxygen which is new with this illness.  Her last nebulizer tx with albuterol was at 10 am this morning without improvement.  She denies chest pain, pleuritic pain,fevers, chills and has had no worsened peripheral edema, although states she has mild ankle edema at baseline.  She was seen by her pcp this am and sent here for further treatment.  A chest xray completed on 09/14/12 was negative for abnormalities including pneumonia and CHF.        The history is provided by the patient.    Past Medical History  Diagnosis Date  . Asthma   . Rheumatoid arthritis   . DM (diabetes mellitus)   . Thyroid disease   . Emphysema     pt denies any O2 use at this time/rm  . Emphysema     Past Surgical History  Procedure Laterality Date  . Eye surgery  at age 79  . Tubal ligation    . Colonoscopy      Family History  Problem Relation Age of Onset  . Asthma Mother   . Heart disease Mother   . Clotting disorder Mother   . Colon cancer Neg Hx     History  Substance Use Topics  . Smoking status: Former Smoker -- 3.00 packs/day for 43 years    Types: Cigarettes    Quit date: 04/24/2007  . Smokeless tobacco: Never Used     Comment:  electronic cigarette daily  . Alcohol Use: Yes     Comment: occasional    OB History   Grav Para Term Preterm Abortions TAB SAB Ect Mult Living                  Review of Systems  Constitutional: Negative for fever.  HENT: Negative for congestion, sore throat and neck pain.   Eyes: Negative.   Respiratory: Positive for cough, chest tightness, shortness of breath and wheezing.   Cardiovascular: Negative for chest pain and palpitations.  Gastrointestinal: Negative for nausea and abdominal pain.  Genitourinary: Negative.   Musculoskeletal: Negative for joint swelling and arthralgias.  Skin: Negative.  Negative for rash and wound.  Neurological: Negative for dizziness, weakness, light-headedness, numbness and headaches.  Psychiatric/Behavioral: Negative.     Allergies  Penicillins; Sulfonamide derivatives; and Tetanus toxoid  Home Medications   Current Outpatient Rx  Name  Route  Sig  Dispense  Refill  . albuterol (PROVENTIL) (2.5 MG/3ML) 0.083% nebulizer solution   Nebulization   Take 2.5 mg by nebulization every 6 (six) hours as needed for wheezing.         Marland Kitchen allopurinol (ZYLOPRIM) 100 MG tablet   Oral   Take 1 tablet by mouth Daily.         Marland Kitchen  Ascorbic Acid (VITAMIN C) 1000 MG tablet   Oral   Take 1,000 mg by mouth daily.         . Cholecalciferol (VITAMIN D-3) 5000 UNITS TABS   Oral   Take 1 tablet by mouth daily.         . Cyanocobalamin (VITAMIN B12 PO)   Oral   Take 1 tablet by mouth every Monday, Wednesday, and Friday.          . Fluticasone-Salmeterol (ADVAIR) 250-50 MCG/DOSE AEPB   Inhalation   Inhale 1 puff into the lungs every 12 (twelve) hours.         . folic acid (FOLVITE) 1 MG tablet   Oral   Take 2 mg by mouth daily.          . furosemide (LASIX) 40 MG tablet   Oral   Take 40 mg by mouth daily.          Marland Kitchen KLOR-CON M20 20 MEQ tablet   Oral   Take 60 tablets by mouth daily.          Marland Kitchen levofloxacin (LEVAQUIN) 500 MG  tablet   Oral   Take 500 mg by mouth daily. Starting 09/14/12 for 10 days         . levothyroxine (SYNTHROID, LEVOTHROID) 50 MCG tablet   Oral   Take 1 tablet by mouth Daily.         Marland Kitchen MAGNESIUM-ZINC PO   Oral   Take 2 tablets by mouth daily.          . methotrexate (RHEUMATREX) 2.5 MG tablet   Oral   Take 7.5 tablets by mouth Once a week. Thursday         . montelukast (SINGULAIR) 10 MG tablet   Oral   Take 1 tablet by mouth daily.         . Multiple Vitamin (MULTIVITAMIN) capsule   Oral   Take 1 capsule by mouth daily.         . predniSONE (DELTASONE) 20 MG tablet   Oral   Take 20 mg by mouth See admin instructions. Starting 09/14/12 day 1 take 20 mg 3 times a day x 2 days; day 2 take 20 mg twice daily x 2 days, then take 20 mg daily for 5 days         . STUDY MEDICATION   Oral   Take 1 tablet by mouth daily. VX105 study drug for rheumatoid arthritis 150 mg daily         . tiotropium (SPIRIVA HANDIHALER) 18 MCG inhalation capsule   Inhalation   Place 1 capsule (18 mcg total) into inhaler and inhale daily.   30 capsule   11     BP 153/85  Pulse 98  Temp(Src) 98.1 F (36.7 C) (Oral)  Resp 26  SpO2 96%  Physical Exam  Vitals reviewed. Constitutional: She appears well-developed and well-nourished.  HENT:  Head: Normocephalic and atraumatic.  Eyes: Conjunctivae are normal.  Neck: Normal range of motion.  Cardiovascular: Regular rhythm, normal heart sounds and intact distal pulses.  Tachycardia present.   Pulmonary/Chest: Effort normal. No accessory muscle usage. She has decreased breath sounds. She has wheezes. She has rhonchi in the left middle field. She has no rales.  Abdominal: Soft. Bowel sounds are normal. There is no tenderness.  Musculoskeletal: Normal range of motion.  Neurological: She is alert.  Skin: Skin is warm and dry.  Psychiatric: She has a normal mood and affect.  ED Course  Procedures (including critical care time)  Labs  Reviewed  CBC - Abnormal; Notable for the following:    WBC 21.2 (*)    All other components within normal limits  BASIC METABOLIC PANEL - Abnormal; Notable for the following:    Glucose, Bld 110 (*)    All other components within normal limits  PRO B NATRIURETIC PEPTIDE - Abnormal; Notable for the following:    Pro B Natriuretic peptide (BNP) 333.7 (*)    All other components within normal limits  TROPONIN I  POCT I-STAT TROPONIN I   Dg Chest 2 View  09/20/2012  *RADIOLOGY REPORT*  Clinical Data: Shortness of breath, cough, congestion, asthma  CHEST - 2 VIEW  Comparison: 09/14/2012  Findings: Cardiomediastinal silhouette is stable.  No acute infiltrate or pulmonary edema.  Stable chronic interstitial prominence.  Central mild bronchitic changes.  IMPRESSION:  No acute infiltrate or pulmonary edema.  Chronic interstitial prominence.  Central mild bronchitic changes.   Original Report Authenticated By: Natasha Mead, M.D.      1. COPD with acute exacerbation       MDM  Pt with acute exacerbation of copd with no improvement despite 6 day course of prednisone taper and levaquin.  Pt with persistent wheezing,  Although moving better air after neb tx.  She becomes sob just with sitting from reclined position for re-exam despite being on o2 at 2 L.  She was given prednisone 60 mg po x 1.  Plan for admission.  Spoke with Dr. Radonna Ricker with [facility] who accepts this patient for admission.  He recommended starting IV doxycycline which has been ordered.  Admission holding orders given.     Date: 09/20/2012  Rate: 106  Rhythm: sinus tachycardia  QRS Axis: normal  Intervals: normal  ST/T Wave abnormalities: normal  Conduction Disutrbances:none  Narrative Interpretation: unchanged except for rate compared to 02/02/12  Old EKG Reviewed: changes noted      Burgess Amor, PA-C 09/20/12 1552

## 2012-09-20 NOTE — Progress Notes (Signed)
Pt arrived from ED, VSS, SOB, remains in tripod/sitting on side of bed. Will continue to monitor.

## 2012-09-20 NOTE — ED Notes (Signed)
PT unable to complete neb . Treatment. Because of difficulty breathing . Neb treatment stopped because  Pt request.

## 2012-09-20 NOTE — ED Notes (Signed)
Pt reports she has been increasingly SOB Over past weeks, has been treated with several oral abx by dr Oneta Rack and symptoms are not resolving. Pt able to speak in short phrases now, denies pain, tried her nebs at home with no relief.

## 2012-09-21 LAB — COMPREHENSIVE METABOLIC PANEL
Alkaline Phosphatase: 97 U/L (ref 39–117)
BUN: 22 mg/dL (ref 6–23)
CO2: 27 mEq/L (ref 19–32)
Chloride: 100 mEq/L (ref 96–112)
GFR calc Af Amer: >90 mL/min (ref >90)
GFR calc non Af Amer: 90 mL/min — ABNORMAL LOW (ref >90)
Glucose, Bld: 159 mg/dL — ABNORMAL HIGH (ref 70–99)
Potassium: 4 mEq/L (ref 3.5–5.1)
Total Bilirubin: 0.1 mg/dL — ABNORMAL LOW (ref 0.3–1.2)

## 2012-09-21 LAB — CBC
MCV: 92.6 fL (ref 78.0–100.0)
Platelets: 190 10*3/uL (ref 150–400)
RDW: 14.8 % (ref 11.5–15.5)
WBC: 18.7 10*3/uL — ABNORMAL HIGH (ref 4.0–10.5)

## 2012-09-21 MED ORDER — GUAIFENESIN ER 600 MG PO TB12
600.0000 mg | ORAL_TABLET | Freq: Two times a day (BID) | ORAL | Status: DC
  Administered 2012-09-21 – 2012-09-23 (×4): 600 mg via ORAL
  Filled 2012-09-21 (×5): qty 1

## 2012-09-21 MED ORDER — DOXYCYCLINE HYCLATE 100 MG PO TABS
100.0000 mg | ORAL_TABLET | Freq: Two times a day (BID) | ORAL | Status: DC
  Administered 2012-09-21 – 2012-09-23 (×4): 100 mg via ORAL
  Filled 2012-09-21 (×5): qty 1

## 2012-09-21 MED ORDER — METHYLPREDNISOLONE SODIUM SUCC 125 MG IJ SOLR
80.0000 mg | Freq: Three times a day (TID) | INTRAMUSCULAR | Status: DC
  Administered 2012-09-21 – 2012-09-22 (×3): 80 mg via INTRAVENOUS
  Filled 2012-09-21 (×5): qty 1.28

## 2012-09-21 MED ORDER — METHOTREXATE 2.5 MG PO TABS
7.5000 mg | ORAL_TABLET | ORAL | Status: DC
  Administered 2012-09-23: 7.5 mg via ORAL
  Filled 2012-09-21: qty 3

## 2012-09-21 MED ORDER — OXYMETAZOLINE HCL 0.05 % NA SOLN
1.0000 | Freq: Two times a day (BID) | NASAL | Status: DC
  Administered 2012-09-21 – 2012-09-22 (×4): 1 via NASAL
  Filled 2012-09-21: qty 15

## 2012-09-21 MED ORDER — NYSTATIN 100000 UNIT/GM EX CREA
TOPICAL_CREAM | Freq: Two times a day (BID) | CUTANEOUS | Status: DC
  Administered 2012-09-21 – 2012-09-23 (×5): via TOPICAL
  Filled 2012-09-21 (×2): qty 15

## 2012-09-21 NOTE — ED Provider Notes (Signed)
Medical screening examination/treatment/procedure(s) were conducted as a shared visit with non-physician practitioner(s) and myself.  I personally evaluated the patient during the encounter.  COPD exacerbation.   failed outpatient treatment.  Breathing treatments, IV steroids, chest x-ray, admit.    CRITICAL CARE Performed by: Donnetta Hutching  ?  Total critical care time:30  Critical care time was exclusive of separately billable procedures and treating other patients.  Critical care was necessary to treat or prevent imminent or life-threatening deterioration.  Critical care was time spent personally by me on the following activities: development of treatment plan with patient and/or surrogate as well as nursing, discussions with consultants, evaluation of patient's response to treatment, examination of patient, obtaining history from patient or surrogate, ordering and performing treatments and interventions, ordering and review of laboratory studies, ordering and review of radiographic studies, pulse oximetry and re-evaluation of patient's condition.  Donnetta Hutching, MD 09/21/12 [phone]

## 2012-09-21 NOTE — Progress Notes (Signed)
Pt to TX to [id number], on room air, VSS, called report.

## 2012-09-21 NOTE — Evaluation (Signed)
Occupational Therapy Evaluation Patient Details Name: Jacqueline Washington MRN: [id number] DOB: Oct 22, 1950 Today's Date: 09/21/2012 Time: 1206-1220 OT Time Calculation (min): 14 min  OT Assessment / Plan / Recommendation Clinical Impression  62 yo femaled admitted for SOB with hx of COPD. Pt currently demostrates DOE (3 out 4 ) with speaking. pt could benefit from DME however unable to afford and insurance does not cover. Ot to sign off acutely.     OT Assessment  Patient does not need any further OT services    Follow Up Recommendations  No OT follow up    Barriers to Discharge      Equipment Recommendations  None recommended by OT (recommend Shower seat however pt can not afford to buy)    Recommendations for Other Services    Frequency       Precautions / Restrictions Precautions Precautions: None Restrictions Weight Bearing Restrictions: No   Pertinent Vitals/Pain 3 out 4 dyspnea Coughing up secretions with EOB mobility      ADL  Eating/Feeding: Modified independent Where Assessed - Eating/Feeding: Bed level Grooming: Wash/dry face;Modified independent Where Assessed - Grooming: Unsupported sitting Upper Body Bathing: Chest;Right arm;Left arm;Abdomen;Modified independent Where Assessed - Upper Body Bathing: Unsupported sitting Lower Body Bathing: Other (comment) (husband to (A)) Toilet Transfer: Modified independent Toilet Transfer Method: Sit to Barista: Regular height toilet ADL Comments: Pt educated on energy conservation for adls. Pt reading handout and states that she is currently using some of the methods listed. Pt educated on adding addition techniques. PT requesting information on how to obtain electric scooter and family requesting patient shower at [facility] prior to home due to difficulty making transfer. Pt offered solutions for transfer ie shower seat and currently can not afford. OT recommends patient sponge bath at sink level unless family  able to assist with transfer. Pt unable to tolerate >5 minutes standing due to DOE.    OT Diagnosis:    OT Problem List:   OT Treatment Interventions:     OT Goals Acute Rehab OT Goals OT Goal Formulation: With patient  Visit Information  Last OT Received On: 09/21/12 Assistance Needed: +1    Subjective Data  Subjective: "I have a really small bathroom" Patient Stated Goal: to return home and get a scooter if insurance will help   Prior Functioning     Home Living Lives With: Spouse Available Help at Discharge: Family Type of Home: House Home Layout: One level Bathroom Shower/Tub: Tub/shower unit;Curtain Firefighter: Standard Home Adaptive Equipment: Straight cane;Walker - rolling;Wheelchair - manual Additional Comments: Pt requesting information about a scooter for house use and community use. Pt is unable to use craiglist purchased w/c around the house because of the width. Pt lives in 1000 sq foot house and the further distance in the house is ~ 24 steps per patient. Prior Function Level of Independence: Needs assistance;Independent with assistive device(s) Needs Assistance: Bathing;Dressing;Light Housekeeping Bath: Minimal Dressing: Minimal Light Housekeeping: Total Able to Take Stairs?: No Driving: No Vocation: On disability Comments: Pt has husband (A) for bathing and dressing on days that are more difficult. pt declines AE education and explains she can not afford to purchase any equipment at all. Pt educated on the benefit of a shower seat and locations that could locate a more affordable seat. Communication Communication: No difficulties;Other (comment) (DOE and can not  complete full sentence without pausing) Dominant Hand: Right         Vision/Perception Vision - Assessment Vision Assessment:  Vision not tested   Cognition  Cognition Overall Cognitive Status: Appears within functional limits for tasks assessed/performed Arousal/Alertness:  Awake/alert Orientation Level: Appears intact for tasks assessed Behavior During Session: [facility] for tasks performed    Extremity/Trunk Assessment Right Upper Extremity Assessment RUE ROM/Strength/Tone: Within functional levels RUE Sensation: WFL - Light Touch RUE Coordination: WFL - gross/fine motor Left Upper Extremity Assessment LUE ROM/Strength/Tone: Within functional levels LUE Sensation: WFL - Light Touch LUE Coordination: WFL - gross/fine motor Trunk Assessment Trunk Assessment: Kyphotic     Mobility Bed Mobility Bed Mobility: Supine to Sit;Sitting - Scoot to Edge of Bed;Sit to Supine Supine to Sit: 6: Modified independent (Device/Increase time) Sitting - Scoot to Edge of Bed: 6: Modified independent (Device/Increase time) Sit to Supine: 6: Modified independent (Device/Increase time) Transfers Transfers: Sit to Stand;Stand to Sit Sit to Stand: 6: Modified independent (Device/Increase time) Stand to Sit: 6: Modified independent (Device/Increase time)     Exercise     Balance     End of Session OT - End of Session Activity Tolerance: Other (comment) (limited by SOB) Patient left: in bed;with call bell/phone within reach;with family/visitor present Nurse Communication: Mobility status;Precautions  GO     Lucile Shutters 09/21/2012, 3:39 PM Pager: [phone]

## 2012-09-21 NOTE — Progress Notes (Signed)
09/21/2012 Patient transfer from 3300 to 6700 at 1342. She is alert, oriented and ambulatory. Patient does have expiratory wheezes and does get shortness of breath on exertion. Patient have edema in bilateral lower legs, bruises on bilateral upper and lower arms. Excoriation under bilateral breast the left more than the right. On the right side under abdominal noted excoriation. Patient does have a scratch area the right side abdomen and little bruise on abdomen. [facility] RN.

## 2012-09-21 NOTE — Progress Notes (Signed)
Utilization review completed.  

## 2012-09-22 DIAGNOSIS — R600 Localized edema: Secondary | ICD-10-CM

## 2012-09-22 DIAGNOSIS — I5030 Unspecified diastolic (congestive) heart failure: Secondary | ICD-10-CM

## 2012-09-22 DIAGNOSIS — R0609 Other forms of dyspnea: Secondary | ICD-10-CM

## 2012-09-22 DIAGNOSIS — I5033 Acute on chronic diastolic (congestive) heart failure: Secondary | ICD-10-CM

## 2012-09-22 DIAGNOSIS — R0989 Other specified symptoms and signs involving the circulatory and respiratory systems: Secondary | ICD-10-CM

## 2012-09-22 HISTORY — DX: Unspecified diastolic (congestive) heart failure: I50.30

## 2012-09-22 LAB — CBC
HCT: 39.6 % (ref 36.0–46.0)
Hemoglobin: 13.2 g/dL (ref 12.0–15.0)
MCHC: 33.3 g/dL (ref 30.0–36.0)
WBC: 23.4 10*3/uL — ABNORMAL HIGH (ref 4.0–10.5)

## 2012-09-22 LAB — BASIC METABOLIC PANEL
BUN: 25 mg/dL — ABNORMAL HIGH (ref 6–23)
Chloride: 104 mEq/L (ref 96–112)
GFR calc Af Amer: >90 mL/min (ref >90)
GFR calc non Af Amer: 89 mL/min — ABNORMAL LOW (ref >90)
Potassium: 3.7 mEq/L (ref 3.5–5.1)
Sodium: 143 mEq/L (ref 135–145)

## 2012-09-22 LAB — T4, FREE: Free T4: 1.31 ng/dL (ref 0.80–1.80)

## 2012-09-22 LAB — T3, FREE: T3, Free: 2.2 pg/mL — ABNORMAL LOW (ref 2.3–4.2)

## 2012-09-22 MED ORDER — METHYLPREDNISOLONE SODIUM SUCC 125 MG IJ SOLR
80.0000 mg | Freq: Two times a day (BID) | INTRAMUSCULAR | Status: DC
  Administered 2012-09-23: 80 mg via INTRAVENOUS
  Filled 2012-09-22 (×3): qty 1.28

## 2012-09-22 MED ORDER — HYDRALAZINE HCL 20 MG/ML IJ SOLN: 10.0000 mg | INTRAMUSCULAR | Status: DC | PRN

## 2012-09-22 MED ORDER — FLUTICASONE PROPIONATE 50 MCG/ACT NA SUSP
1.0000 | Freq: Every day | NASAL | Status: DC
  Administered 2012-09-22: 1 via NASAL
  Filled 2012-09-22: qty 16

## 2012-09-22 MED ORDER — POTASSIUM CHLORIDE CRYS ER 20 MEQ PO TBCR
40.0000 meq | EXTENDED_RELEASE_TABLET | Freq: Every day | ORAL | Status: DC
  Administered 2012-09-22 – 2012-09-23 (×2): 40 meq via ORAL
  Filled 2012-09-22 (×2): qty 2

## 2012-09-22 MED ORDER — SALINE SPRAY 0.65 % NA SOLN
1.0000 | NASAL | Status: DC | PRN
  Filled 2012-09-22: qty 44

## 2012-09-22 MED ORDER — FUROSEMIDE 40 MG PO TABS
40.0000 mg | ORAL_TABLET | Freq: Every day | ORAL | Status: DC
  Administered 2012-09-23: 40 mg via ORAL
  Filled 2012-09-22: qty 1

## 2012-09-22 MED ORDER — FUROSEMIDE 10 MG/ML IJ SOLN
40.0000 mg | Freq: Once | INTRAMUSCULAR | Status: AC
  Administered 2012-09-22: 40 mg via INTRAVENOUS
  Filled 2012-09-22 (×3): qty 4

## 2012-09-22 NOTE — Progress Notes (Addendum)
[facility] PROGRESS NOTE  Jacqueline Washington ZOX:[id number] DOB: 02/12/1951 DOA: 09/20/2012 PCP: Jacqueline Corwin, MD  Assessment/Plan  Acute on chronic hypoxic respiratory failure due to acute COPD exacerbation:  May have been triggered by sinus allergies or URI.  Her sinus cognestion is improving.  May also have some OSA or OHS superimposed.    -  Wean solumedrol to q12h  -  Continue doxycycline.   -  Continue spiriva and dulera -  Continue scheduled and prn albuterol -  Continue singulair -  Spoke with Dr. Sung Amabile, Pulm, regarding plan of care.  Recommends increasing to 3L [place], particularly overnight and with exertion.    -  Will need close follow up with Dr. Delford Field (within 1 week of discharge)    Sinus congestion:  Allergy vs. Acute bacterial Sinusitis vs. Viral URI, most likely allergy or virus -  Day 2 of afrin (d.c tomorrow -  Start flonase with nasal saline -  Continue singulair -  Consider CT sinuses if not continuing to improve  Rash, improving.  Continue nystatin  Hypothyroid, on synthroid with low TSH -  Unclear if she has central hypothyroid or primary hypothyroid.  If central, may explain low TSH and may not reflect overmedication with synthroid -  Will send free thryoxine and free T3 levels:  Low to normal.   -  Continue synthroid.   Lower extremity edema, possibly venous stasis vs. acute Diastolic dysfunction (grade 1)  -  restart lasix and potassium -  Elevate extremities    Leukocytosis, likely reactive from steroids   Elevated blood pressures, patient states it results from pain when cuff is inflating.   -  Hydralazine prn -  Repeat as outpateint  Diet:  Regular diet Access:  PIV IVF:  none Proph:  heparin  Code Status: full  Family Communication: spoke with patient and family who were at bedside Disposition Plan: pending improvement in respiratory status.  Possibly to home tomorrow with increased home oxygen   Consultants:  Spoke with Dr.  Darrol Angel from Pulmonology regarding plan of care  Procedures:  CXR  Antibiotics:  Doxycycline 4/7 >>   HPI/Subjective:  Denies fevers, chills, headache.  Denies chest pain.  Sinus congestion and shortness of breath and wheeze are improving.  Denies nausea, vomiting, diarrhea, constipation.  Has been eating well.  Has been out of bed.  Denies pain.    Objective: Filed Vitals:   09/22/12 0925 09/22/12 1033 09/22/12 1250 09/22/12 1325  BP: 209/69 162/76  184/89  Pulse: 95   96  Temp: 97.8 F (36.6 C)   97.7 F (36.5 C)  TempSrc: Oral   Oral  Resp: 20   18  Height:      Weight:      SpO2: 98%  93% 91%    Intake/Output Summary (Last 24 hours) at 09/22/12 1413 Last data filed at 09/22/12 1300  Gross per 24 hour  Intake    480 ml  Output      0 ml  Net    480 ml   Filed Weights   09/20/12 1850 09/21/12 2233  Weight: 98.7 kg (217 lb 9.5 oz) 101.3 kg (223 lb 5.2 oz)    Exam:   General:  CF, Mild to moderate respiratory distress when ambulating down hall and then while recovering while in room with SCM retractions and forced expirations.   HEENT:  NCAT, MMM  Cardiovascular:  RRR, nl S1, S2 no mrg, 2+ pulses, warm extremities  Respiratory:  Diminished bilateral breath sounds at bases.  High pitched expiratory wheeze.  No rales or rhonchi.    Abdomen:   NABS, soft, NT/ND  MSK:   Normal tone and bulk, 1+ bilateral LEE  Neuro:   Grossly intact  Data Reviewed: Basic Metabolic Panel:  Recent Labs Lab 09/20/12 1312 09/20/12 1939 09/21/12 0441 09/22/12 0555  NA 142  --  137 143  K 3.9  --  4.0 3.7  CL 103  --  100 104  CO2 29  --  27 29  GLUCOSE 110*  --  159* 163*  BUN 20  --  22 25*  CREATININE 0.73 0.77 0.75 0.76  CALCIUM 9.4  --  9.3 9.4   Liver Function Tests:  Recent Labs Lab 09/21/12 0441  AST 30  ALT 47*  ALKPHOS 97  BILITOT 0.1*  PROT 6.2  ALBUMIN 2.6*   No results found for this basename: LIPASE, AMYLASE,  in the last 168 hours No  results found for this basename: AMMONIA,  in the last 168 hours CBC:  Recent Labs Lab 09/20/12 1312 09/20/12 1939 09/21/12 0441 09/22/12 0555  WBC 21.2* 20.5* 18.7* 23.4*  HGB 14.7 14.6 13.2 13.2  HCT 43.3 43.3 39.9 39.6  MCV 91.5 92.7 92.6 92.3  PLT 200 191 190 218   Cardiac Enzymes:  Recent Labs Lab 09/20/12 1314  TROPONINI <0.30   BNP (last 3 results)  Recent Labs  09/20/12 1316  PROBNP 333.7*   CBG: No results found for this basename: GLUCAP,  in the last 168 hours  Recent Results (from the past 240 hour(s))  MRSA PCR SCREENING     Status: None   Collection Time    09/20/12  6:17 PM      Result Value Range Status   MRSA by PCR NEGATIVE  NEGATIVE Final   Comment:            The GeneXpert MRSA Assay (FDA     approved for NASAL specimens     only), is one component of a     comprehensive MRSA colonization     surveillance program. It is not     intended to diagnose MRSA     infection nor to guide or     monitor treatment for     MRSA infections.     Studies: No results found.  Scheduled Meds: . albuterol  2.5 mg Nebulization Q4H  . allopurinol  100 mg Oral Daily  . doxycycline  100 mg Oral Q12H  . folic acid  2 mg Oral Daily  . guaiFENesin  600 mg Oral BID  . heparin  5,000 Units Subcutaneous Q8H  . levothyroxine  50 mcg Oral QAC breakfast  . [START ON 09/23/2012] methotrexate  7.5 mg Oral Weekly  . [START ON 09/23/2012] methylPREDNISolone (SOLU-MEDROL) injection  80 mg Intravenous Q12H  . mometasone-formoterol  2 puff Inhalation BID  . montelukast  10 mg Oral Daily  . nystatin cream   Topical BID  . oxymetazoline  1 spray Each Nare BID  . sodium chloride  3 mL Intravenous Q12H  . 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 If 7PM-7AM, please contact night-coverage at [email or web address], password [facility] 09/22/2012, 2:13 PM  LOS: 2 days

## 2012-09-22 NOTE — Progress Notes (Signed)
Brief Nutrition Note  RD consulted for Nutrition Assessment via COPD GOLD Protocol.  RD provided "COPD Nutrition Therapy" handout from the Academy of Nutrition and Dietetics. Discussed different food groups and need for balanced meals and snacks. Provided examples of ways to balance meals/snacks and encouraged intake of high-fiber, whole grain complex carbohydrates as well as healthy protein choices. Teach back method used.  Expect good compliance.  Body mass index is 38.31 kg/(m^2). Pt meets criteria for Obese Class II based on current BMI.  Current diet order is Regular, patient is consuming approximately 100% of meals at this time. Labs and medications reviewed. No further nutrition interventions warranted at this time. RD contact information provided. If additional nutrition issues arise, please re-consult RD.  Jarold Motto MS, RD, LDN Pager: [phone] After-hours pager: [phone]

## 2012-09-22 NOTE — Progress Notes (Signed)
SATURATION QUALIFICATIONS:   Patient Saturations on Room Air at Rest = 91% Patient Saturations on Room Air while Ambulating = 86% Patient Saturations on 2 Liters of oxygen while Ambulating = 87%   Please briefly explain why patient needs home oxygen: De-saturates when ambulating

## 2012-09-22 NOTE — Care Management Note (Signed)
   CARE MANAGEMENT NOTE 09/22/2012  Patient:  Jacqueline Washington, Jacqueline Washington   Account Number:  [id number]  Date Initiated:  09/22/2012  Documentation initiated by:  Elanda Garmany  Subjective/Objective Assessment:   Pt with COPD, case manage referral received.     Action/Plan:   Met with pt and daughter, pt has home O2 with [employer], will notifiy if flow rate incrased for home use. Pt has not had HHRN previously, would like to use [facility] if [facility] ordered. No other DME needs.   Anticipated DC Date:  09/24/2012   Anticipated DC Plan:           Choice offered to / List presented to:             Status of service:  In process, will continue to follow Medicare Important Message given?   (If response is "NO", the following Medicare IM given date fields will be blank) Date Medicare IM given:   Date Additional Medicare IM given:    Discharge Disposition:    Per UR Regulation:    If discussed at Long Length of Stay Meetings, dates discussed:    Comments:  09/22/2012 Pt has PCP, Winn Jock MD, has pulmonolgist , Dr Delford Field. Pt does not feel that she has other needs at this time. Johny Shock RN MPH [phone]

## 2012-09-23 ENCOUNTER — Telehealth: Payer: Self-pay | Admitting: Critical Care Medicine

## 2012-09-23 DIAGNOSIS — I5030 Unspecified diastolic (congestive) heart failure: Secondary | ICD-10-CM

## 2012-09-23 DIAGNOSIS — R609 Edema, unspecified: Secondary | ICD-10-CM

## 2012-09-23 LAB — T3, FREE: T3, Free: 2.5 pg/mL (ref 2.3–4.2)

## 2012-09-23 LAB — T4, FREE: Free T4: 1.33 ng/dL (ref 0.80–1.80)

## 2012-09-23 MED ORDER — FLUTICASONE PROPIONATE 50 MCG/ACT NA SUSP: 1.0000 | Freq: Every day | NASAL | Status: DC

## 2012-09-23 MED ORDER — SALINE SPRAY 0.65 % NA SOLN: 1.0000 | NASAL | Status: DC | PRN

## 2012-09-23 MED ORDER — PREDNISONE 10 MG PO TABS: ORAL_TABLET | ORAL | Status: DC

## 2012-09-23 MED ORDER — GUAIFENESIN ER 600 MG PO TB12: 600.0000 mg | ORAL_TABLET | Freq: Two times a day (BID) | ORAL | Status: DC

## 2012-09-23 MED ORDER — DOXYCYCLINE HYCLATE 100 MG PO TABS: 100.0000 mg | ORAL_TABLET | Freq: Two times a day (BID) | ORAL | Status: DC

## 2012-09-23 MED ORDER — POTASSIUM CHLORIDE CRYS ER 20 MEQ PO TBCR: 40.0000 meq | EXTENDED_RELEASE_TABLET | Freq: Every day | ORAL | Status: DC

## 2012-09-23 MED ORDER — WHITE PETROLATUM GEL
Status: AC
  Administered 2012-09-23: 0.2
  Filled 2012-09-23: qty 5

## 2012-09-23 MED ORDER — CETIRIZINE HCL 10 MG PO TABS: 10.0000 mg | ORAL_TABLET | Freq: Every day | ORAL | Status: DC

## 2012-09-23 MED ORDER — NYSTATIN 100000 UNIT/GM EX CREA: TOPICAL_CREAM | Freq: Two times a day (BID) | CUTANEOUS | Status: DC

## 2012-09-23 MED ORDER — FLUCONAZOLE 150 MG PO TABS
150.0000 mg | ORAL_TABLET | Freq: Once | ORAL | Status: DC
  Filled 2012-09-23: qty 1

## 2012-09-23 MED ORDER — POLYETHYLENE GLYCOL 3350 17 G PO PACK: 17.0000 g | PACK | Freq: Every day | ORAL | Status: DC | PRN

## 2012-09-23 NOTE — Discharge Summary (Signed)
Physician Discharge Summary  Jacqueline Washington ZOX:[id number] DOB: 1951/04/26 DOA: 09/20/2012  PCP: Jacqueline Corwin, MD  Admit date: 09/20/2012 Discharge date: 09/23/2012  Recommendations for Outpatient Follow-up:  1. Follow up with Dr. Delford Washington within 1 week of discharge for ongoing management of COPD exacerbation and sinus symptoms.  Consideration of sleep study if not already complete.  CT sinuses if still having sinus symptoms at follow up.    Discharge Diagnoses:  Active Problems:   OBSTRUCTIVE CHRONIC BRONCHITIS WITHOUT EXACERBAT   Acute respiratory failure with hypoxia   Leukocytosis   Diastolic heart failure   Lower extremity edema   Discharge Condition: stable, improved  Diet recommendation: regular  Wt Readings from Last 3 Encounters:  09/21/12 101.3 kg (223 lb 5.2 oz)  05/28/12 102.967 kg (227 lb)  05/05/12 103.329 kg (227 lb 12.8 oz)    History of present illness:   Jacqueline Washington is a 62 y.o. female  Past medical history of tobacco abuse, comes in for shortness of breath, started about 6 days prior to admission. She was started on prednisone taper and 6 day course of Levaquin by her PCP 6 days prior to admission. She went back to her primary care doctor the day of admission and her shortness of breath did not improve she was satting 90% on room air so she was sent here to the emergency room. She relates she can normally walk more than 100 feet without getting Jacqueline Washington of breath now she is Jacqueline Washington of breath sitting in bed.  She's not able to speak in full sentences  Hospital Course:   Acute on chronic hypoxic respiratory failure due to acute COPD exacerbation: May have been triggered by sinus allergies or URI. Her sinus cognestion is improving. May also have some OSA or OHS superimposed.  She has remained on 2-3L of oxygen.  She was started on high dose solumedrol which tapered as her breathing improved, doxycycline, spiriva, advair, and scheduled with prn albuterol.  At the  time of discharge, she felt close to her baseline.  Spoke with Dr. Sung Washington, [facility], regarding plan of care. Recommends increasing to 3L [place], particularly overnight and with exertion.  I counseled patient repeatedly regarding compliance with medications and oxygen.  Will need close follow up with Dr. Delford Washington (within 1 week of discharge)   Sinus congestion: Allergy vs. Acute bacterial Sinusitis vs. Viral URI, most likely allergy or virus.  She was started on afrin which she used for 2 days with some improved.  To prevent rebound congestion, she was started on flonase with nasal saline and she continued singulair.  Also added zyrtec.  Consider outpatient CT sinuses if not continuing to improve.    Rash, improving. Continue nystatin.    Hypothyroid, on synthroid with low TSH.  Unclear if she has central hypothyroid or primary hypothyroid. If central, may explain low TSH and may not reflect overmedication with synthroid.  Free thyroxine and free T3 levels: Low to normal so continued synthroid.   Lower extremity edema, possibly venous stasis vs. acute Diastolic dysfunction (grade 1).  She was diuresed with oral and IV lasix with some improvement in her lower extremity edema and her breathing.  She was advised to continue her lasix at home and monitor for weight gain.   Leukocytosis, likely reactive from steroids.  May repeat as outpatient  Elevated blood pressures, patient states it results from pain when cuff is inflating but may also reflect high dose steroids.  Repeat as outpateint  Consultants:  Spoke with Dr. Darrol Washington from Pulmonology regarding plan of care Procedures:  CXR Antibiotics:  Doxycycline 4/7 >>    Discharge Exam: Filed Vitals:   09/23/12 0522  BP: 171/86  Pulse: 81  Temp: 97.9 F (36.6 C)  Resp: 20   Filed Vitals:   09/22/12 1636 09/22/12 2002 09/22/12 2143 09/23/12 0522  BP:   159/67 171/86  Pulse:   93 81  Temp:   98 F (36.7 C) 97.9 F (36.6 C)  TempSrc:   Oral Oral   Resp:    20  Height:      Weight:      SpO2: 96% 97% 95% 97%    General: CF,  No acute distress while at rest  HEENT: NCAT, MMM  Cardiovascular: RRR, nl S1, S2 no mrg, 2+ pulses, warm extremities  Respiratory: Diminished bilateral breath sounds at bases. High pitched expiratory wheeze. No rales or rhonchi.  Abdomen: NABS, soft, NT/ND  MSK: Normal tone and bulk, 1+ slow pitting bilateral LEE  Neuro: Grossly intact   Discharge Instructions      Discharge Orders   Future Orders Complete By Expires     (HEART FAILURE PATIENTS) Call MD:  Anytime you have any of the following symptoms: 1) 3 pound weight gain in 24 hours or 5 pounds in 1 week 2) shortness of breath, with or without a dry hacking cough 3) swelling in the hands, feet or stomach 4) if you have to sleep on extra pillows at night in order to breathe.  As directed     Call MD for:  difficulty breathing, headache or visual disturbances  As directed     Call MD for:  extreme fatigue  As directed     Call MD for:  hives  As directed     Call MD for:  persistant dizziness or light-headedness  As directed     Call MD for:  persistant nausea and vomiting  As directed     Call MD for:  severe uncontrolled pain  As directed     Call MD for:  temperature >100.4  As directed     Diet general  As directed     Discharge instructions  As directed     Comments:      Please continue your oxygen at 2L while at rest and 3L with exertion and at night.  Start a steroid taper starting by taking 5 tabs of prednisone tomorrow.  Continue your doxycycline for 3 more days, next dose is this evening.  Continue your home COPD medications as before and follow up with Dr. Delford Washington within 1 week.  We will arrange for physical therapy, home nurse and home aid to come to your home to do an assessment.  Try flonase again with nasal saline and some vaseline in the nostrils twice a day to prevent bleeding. If you have nose bleeds, stop your flonase again.  Start  zyrtec.    Increase activity slowly  As directed         Medication List    STOP taking these medications       levofloxacin 500 MG tablet  Commonly known as:  LEVAQUIN      TAKE these medications       albuterol (2.5 MG/3ML) 0.083% nebulizer solution  Commonly known as:  PROVENTIL  Take 2.5 mg by nebulization every 6 (six) hours as needed for wheezing.     allopurinol 100 MG tablet  Commonly known as:  ZYLOPRIM  Take 1 tablet by mouth Daily.     cetirizine 10 MG tablet  Commonly known as:  ZYRTEC  Take 1 tablet (10 mg total) by mouth daily.     doxycycline 100 MG tablet  Commonly known as:  VIBRA-TABS  Take 1 tablet (100 mg total) by mouth every 12 (twelve) hours.     fluticasone 50 MCG/ACT nasal spray  Commonly known as:  FLONASE  Place 1 spray into the nose daily.     Fluticasone-Salmeterol 250-50 MCG/DOSE Aepb  Commonly known as:  ADVAIR  Inhale 1 puff into the lungs every 12 (twelve) hours.     folic acid 1 MG tablet  Commonly known as:  FOLVITE  Take 2 mg by mouth daily.     furosemide 40 MG tablet  Commonly known as:  LASIX  Take 40 mg by mouth daily.     guaiFENesin 600 MG 12 hr tablet  Commonly known as:  MUCINEX  Take 1 tablet (600 mg total) by mouth 2 (two) times daily.     levothyroxine 50 MCG tablet  Commonly known as:  SYNTHROID, LEVOTHROID  Take 1 tablet by mouth Daily.     MAGNESIUM-ZINC PO  Take 2 tablets by mouth daily.     methotrexate 2.5 MG tablet  Commonly known as:  RHEUMATREX  Take 7.5 mg by mouth once a week. Thursdays     montelukast 10 MG tablet  Commonly known as:  SINGULAIR  Take 1 tablet by mouth daily.     multivitamin capsule  Take 1 capsule by mouth daily.     nystatin cream  Commonly known as:  MYCOSTATIN  Apply topically 2 (two) times daily. To area under breasts     polyethylene glycol packet  Commonly known as:  MIRALAX / GLYCOLAX  Take 17 g by mouth daily as needed.     potassium chloride SA 20 MEQ  tablet  Commonly known as:  KLOR-CON M20  Take 2 tablets (40 mEq total) by mouth daily.     predniSONE 10 MG tablet  Commonly known as:  DELTASONE  Starting 09/24/12, take 5 tabs x 2 days, 4 tabs daily x 2 days, 3 tabs x 2 days, 2 tabs x 2 days, 1 tab x 2 days, then stop.     sodium chloride 0.65 % Soln nasal spray  Commonly known as:  OCEAN  Place 1 spray into the nose as needed for congestion.     STUDY MEDICATION  Take 1 tablet by mouth daily. VX105 study drug for rheumatoid arthritis 150 mg daily     tiotropium 18 MCG inhalation capsule  Commonly known as:  SPIRIVA HANDIHALER  Place 1 capsule (18 mcg total) into inhaler and inhale daily.     VITAMIN B12 PO  Take 1 tablet by mouth every Monday, Wednesday, and Friday.     vitamin C 1000 MG tablet  Take 1,000 mg by mouth daily.     Vitamin D-3 5000 UNITS Tabs  Take 1 tablet by mouth daily.       Follow-up Information   Follow up with MCKEOWN,WILLIAM DAVID, MD. Schedule an appointment as soon as possible for a visit in 2 weeks.   Contact information:   [address] [place] [address] [phone]       Follow up with Shan Levans, MD. Schedule an appointment as soon as possible for a visit in 1 week.   Contact information:   [address] [place]  [address] [phone]       The results of significant diagnostics from this hospitalization (including imaging, microbiology, ancillary and laboratory) are listed below for reference.    Significant Diagnostic Studies: Dg Chest 2 View  09/20/2012  *RADIOLOGY REPORT*  Clinical Data: Shortness of breath, cough, congestion, asthma  CHEST - 2 VIEW  Comparison: 09/14/2012  Findings: Cardiomediastinal silhouette is stable.  No acute infiltrate or pulmonary edema.  Stable chronic interstitial prominence.  Central mild bronchitic changes.  IMPRESSION:  No acute infiltrate or pulmonary edema.  Chronic interstitial prominence.  Central mild bronchitic  changes.   Original Report Authenticated By: Natasha Mead, M.D.    Dg Chest 2 View  09/14/2012  *RADIOLOGY REPORT*  Clinical Data: Wheezing, cough.  CHEST - 2 VIEW  Comparison: January 12, 2012.  Findings: Bilateral apical scarring is again noted and unchanged. Cardiomediastinal silhouette appears normal.  Basilar interstitial densities are noted most consistent with scarring which are unchanged compared to prior exam.  No pleural effusion or pneumothorax is noted.  No acute pulmonary disease is noted.  IMPRESSION: No acute cardiopulmonary abnormality seen.   Original Report Authenticated By: Lupita Raider.,  M.D.     Microbiology: Recent Results (from the past 240 hour(s))  MRSA PCR SCREENING     Status: None   Collection Time    09/20/12  6:17 PM      Result Value Range Status   MRSA by PCR NEGATIVE  NEGATIVE Final   Comment:            The GeneXpert MRSA Assay (FDA     approved for NASAL specimens     only), is one component of a     comprehensive MRSA colonization     surveillance program. It is not     intended to diagnose MRSA     infection nor to guide or     monitor treatment for     MRSA infections.     Labs: Basic Metabolic Panel:  Recent Labs Lab 09/20/12 1312 09/20/12 1939 09/21/12 0441 09/22/12 0555  NA 142  --  137 143  K 3.9  --  4.0 3.7  CL 103  --  100 104  CO2 29  --  27 29  GLUCOSE 110*  --  159* 163*  BUN 20  --  22 25*  CREATININE 0.73 0.77 0.75 0.76  CALCIUM 9.4  --  9.3 9.4   Liver Function Tests:  Recent Labs Lab 09/21/12 0441  AST 30  ALT 47*  ALKPHOS 97  BILITOT 0.1*  PROT 6.2  ALBUMIN 2.6*   No results found for this basename: LIPASE, AMYLASE,  in the last 168 hours No results found for this basename: AMMONIA,  in the last 168 hours CBC:  Recent Labs Lab 09/20/12 1312 09/20/12 1939 09/21/12 0441 09/22/12 0555  WBC 21.2* 20.5* 18.7* 23.4*  HGB 14.7 14.6 13.2 13.2  HCT 43.3 43.3 39.9 39.6  MCV 91.5 92.7 92.6 92.3  PLT 200 191 190  218   Cardiac Enzymes:  Recent Labs Lab 09/20/12 1314  TROPONINI <0.30   BNP: BNP (last 3 results)  Recent Labs  09/20/12 1316  PROBNP 333.7*   CBG: No results found for this basename: GLUCAP,  in the last 168 hours  Time coordinating discharge: 45 minutes  Signed:  Maureen Delatte  [facility] 09/23/2012, 10:14 AM

## 2012-09-23 NOTE — Care Management Note (Signed)
   CARE MANAGEMENT NOTE 09/23/2012  Patient:  Jacqueline Washington, Jacqueline Washington   Account Number:  [id number]  Date Initiated:  09/22/2012  Documentation initiated by:  Shanyiah Conde  Subjective/Objective Assessment:   Pt with COPD, case manage referral received.     Action/Plan:   Met with pt and daughter, pt has home O2 with [employer], will notifiy if flow rate incrased for home use. Pt has not had HHRN previously, would like to use [facility] if [facility] ordered. No other DME needs.   Anticipated DC Date:  09/23/2012   Anticipated DC Plan:  HOME W HOME HEALTH SERVICES         [facility] Choice  HOME HEALTH   Choice offered to / List presented to:  [name]        HH arranged  HH-1 RN  HH-2 PT  HH-4 NURSE'S AIDE      HH agency  [facility]   Status of service:  Completed, signed off Medicare Important Message given?   (If response is "NO", the following Medicare IM given date fields will be blank) Date Medicare IM given:   Date Additional Medicare IM given:    Discharge Disposition:  HOME W HOME HEALTH SERVICES  Per UR Regulation:    If discussed at Long Length of Stay Meetings, dates discussed:    Comments:  09/23/2012 Pt d/c to home with [facility] services from [facility]. This CM will notify [employer] of pt oxygen needs at this time. Johny Shock RN MPH Case Manager [phone]  09/22/2012 Pt has PCP, Winn Jock MD, has pulmonolgist , Dr Delford Field. Pt does not feel that she has other needs at this time. Johny Shock RN MPH [phone]

## 2012-09-23 NOTE — Telephone Encounter (Signed)
OK will forward to PW as FYI

## 2012-09-23 NOTE — Telephone Encounter (Signed)
noted 

## 2012-09-23 NOTE — Progress Notes (Signed)
Patient discharged to home. Patient AVS reviewed. Patient verbalized understanding of medications and follow-up appointments.  I reiterated the importance of the patient's compliance with wearing her oxygen (nasal cannula) while at home. Patient stated understanding. Patient remains stable; no signs or symptoms of distress.  Patient educated to return to the ER in cases of exacerbation of admitting symptoms, SOB, dizziness, fever, chest pain, or fainting.

## 2012-10-06 ENCOUNTER — Ambulatory Visit (INDEPENDENT_AMBULATORY_CARE_PROVIDER_SITE_OTHER)
Admission: RE | Admit: 2012-10-06 | Discharge: 2012-10-06 | Disposition: A | Discharge status: Home / Self Care | Payer: Medicare Other | Source: Ambulatory Visit | Attending: Adult Health | Admitting: Adult Health

## 2012-10-06 ENCOUNTER — Encounter: Payer: Self-pay | Admitting: Adult Health

## 2012-10-06 ENCOUNTER — Ambulatory Visit (INDEPENDENT_AMBULATORY_CARE_PROVIDER_SITE_OTHER): Payer: Medicare Other | Admitting: Adult Health

## 2012-10-06 VITALS — BP 128/88 | HR 119 | Temp 99.0°F | Ht 64.0 in | Wt 223.6 lb

## 2012-10-06 DIAGNOSIS — J449 Chronic obstructive pulmonary disease, unspecified: Secondary | ICD-10-CM

## 2012-10-06 MED ORDER — ARFORMOTEROL TARTRATE 15 MCG/2ML IN NEBU: 15.0000 ug | INHALATION_SOLUTION | Freq: Two times a day (BID) | RESPIRATORY_TRACT | Status: DC

## 2012-10-06 MED ORDER — BUDESONIDE 0.25 MG/2ML IN SUSP: 0.2500 mg | Freq: Two times a day (BID) | RESPIRATORY_TRACT | Status: DC

## 2012-10-06 NOTE — Progress Notes (Signed)
Subjective:    Patient ID: Jacqueline Washington, female    DOB: May 17, 1951, 62 y.o.   MRN: [id number]  HPI  62 yo WF referred for abn CT Pt noted onset bad URI onset end 05/2011.  Noted a cough, no fever.  Noted chest pain ant chest area. Noted more dyspnea with minimal exertion.  Rx Abx doxy, prednisone.  CXR abnormal.  CT abn but PET scan did not show uptake.   Then got Zpak and pred and this helped more. Now currently: is still dyspneic.  Notes wheeze with walking. Pt denies back to baseline. No real coughing.  Pt notes light headed and nauseated.  Notes dysphagia. No heartburn No fever now.  08/12/11 K level is better.  No cough now.  Fungal study:  Pos aspergillous antibody. Now no wheeze.  Cellulitis is better   9/62/2013 Cannot afford the copay on advair/spiriva Now no real cough. Notes some wheezing.  No real chest pain.  Pt notes if walks a lot will give out. Notes some edema in feet.      4/62/2014 Post Hospital follow up  Returns for a post hospital follow up .  Admitted 4/7-4/10 for COPD exacerbation and sinusitis. Tx w/ ABX and steroids. Discharged on Doxycycline  and steroid taper. Suspected to have diastolic dysfunction w/ fluid overload that responded to diuresis.  Says cough started to get worse and seen by PCP started on Levaquin- started on 4/21 .   CAT score is 34.  Of note on MTX and study infusion drug for RA .  Still have some SOB, wheezing, chest tightness, prod cough with clearing yellow mucus.- this is getting better but not gone yet.  She denies hemoptysis, chest pain, Or n/v.  Ankles more swollen last 2 days  Cant not afford Advair.    Review of Systems Constitutional:   No  weight loss, night sweats,  Fevers, chills,  +fatigue, or  lassitude.  HEENT:   No headaches,  Difficulty swallowing,  Tooth/dental problems, or  Sore throat,                No sneezing, itching, ear ache,  +nasal congestion, post nasal drip,   CV:  No chest pain,  Orthopnea,  PND, swelling in lower extremities, anasarca, dizziness, palpitations, syncope.   GI  No heartburn, indigestion, abdominal pain, nausea, vomiting, diarrhea, change in bowel habits, loss of appetite, bloody stools.   Resp:   No wheezing.  No chest wall deformity  Skin: no rash or lesions.  GU: no dysuria, change in color of urine, no urgency or frequency.  No flank pain, no hematuria   MS:    No decreased range of motion.  No back pain.  Psych:  No change in mood or affect. No depression or anxiety.  No memory loss.         Objective:   Physical Exam GEN: A/Ox3; pleasant , NAD   HEENT:  /AT,  EACs-clear, TMs-wnl, NOSE-clear, THROAT-clear, no lesions, no postnasal drip or exudate noted.   NECK:  Supple w/ fair ROM; no JVD; normal carotid impulses w/o bruits; no thyromegaly or nodules palpated; no lymphadenopathy.  RESP: Few rhonchi  w/o, wheezes/ rales/ or rhonchi.no accessory muscle use, no dullness to percussion  CARD:  RRR, no m/r/g  , 1+  peripheral edema, pulses intact, no cyanosis or clubbing.  GI:   Soft & nt; nml bowel sounds; no organomegaly or masses detected.  Musco: Warm bil   Neuro: alert, no  focal deficits noted.    Skin: Warm, no lesions or rashes        Assessment & Plan:

## 2012-10-06 NOTE — Patient Instructions (Addendum)
Finish Advair , once finished will start nebs as below Begin Budesonide Neb Twice daily   Begin [employer] Twice daily   Continue on Spriiva 1 puff daily  Use Oxygen 2 l/m at rest and 3l/m with activity and sleep.  Brush/rinse and gargle after inhaler/neb use.  Increase Lasix 40mg  extra tab daily for next 2 days  Finish Levaquin as directed by family doctor.  Set up on sleep oxygen check at home .  Please contact office for sooner follow up if symptoms do not improve or worsen or seek emergency care  Follow up Dr. Delford Field  In 2-3 weeks and As needed      Late Add : stop Methotrexate and repeat cxr on return to office in 2 weeks.

## 2012-10-07 ENCOUNTER — Other Ambulatory Visit: Payer: Self-pay | Admitting: Adult Health

## 2012-10-07 DIAGNOSIS — J189 Pneumonia, unspecified organism: Secondary | ICD-10-CM

## 2012-10-07 NOTE — Progress Notes (Signed)
Result Notes    Notes Recorded by Sherre Lain, MA on 10/07/2012 at 9:48 AM Yes, did speak with patient yesterday 4.23.14 about stopping her methotrexate Called spoke with patient this AM, discussed cxr results with patient and reiterated to stop her MTX > appt scheduled with TP for 2 weeks on 5.7.14 @ 11:15, pt to arrive early for cxr and labs. Orders only encounter created for orders. ------  Notes Recorded by Julio Sicks, NP on 10/07/2012 at 9:12 AM Pt advised on 10/06/12 to stop MTX  ? Pneumonitis secondary to MTX  Advised to finish Levaquin .  Have her cont w/ ov recs , follow up in 1-2 weeks in office  Will need to check ESR on return and repeat cxr  Please contact office for sooner follow up if symptoms do not improve or worsen or seek emergency care

## 2012-10-07 NOTE — Progress Notes (Signed)
Quick Note:  Yes, did speak with patient yesterday 4.23.14 about stopping her methotrexate Called spoke with patient this AM, discussed cxr results with patient and reiterated to stop her MTX > appt scheduled with TP for 2 weeks on 5.7.14 @ 11:15, pt to arrive early for cxr and labs. Orders only encounter created for orders. ______

## 2012-10-12 NOTE — Assessment & Plan Note (Signed)
Recent exacerbation w/ hospitalization complicated by sinusitis and diastolic dysfunction   Check cxr today  Change advair to Neb as this should be more cost effective and not go against her MCR D plan ("Doughnut hole " )  Check ono on O2 - ? May need sleep study in future  Look at meds on return- she is on MTX  For RA -  Plan  Finish Advair , once finished will start nebs as below Begin Budesonide Neb Twice daily   Begin [employer] Twice daily   Continue on Spriiva 1 puff daily  Use Oxygen 2 l/m at rest and 3l/m with activity and sleep.  Brush/rinse and gargle after inhaler/neb use.  Increase Lasix 40mg  extra tab daily for next 2 days  Finish Levaquin as directed by family doctor.  Set up on ONO to make sure no desats on current regimen.  Please contact office for sooner follow up if symptoms do not improve or worsen or seek emergency care  Follow up Dr. Delford Field  In 2-3 weeks and As needed

## 2012-10-15 ENCOUNTER — Inpatient Hospital Stay (HOSPITAL_COMMUNITY)
Admission: EM | Admit: 2012-10-15 | Discharge: 2012-10-26 | DRG: 871 | Disposition: A | Discharge status: Home / Self Care | Payer: Medicare Other | Attending: Internal Medicine | Admitting: Internal Medicine

## 2012-10-15 ENCOUNTER — Emergency Department (HOSPITAL_COMMUNITY): Payer: Medicare Other

## 2012-10-15 ENCOUNTER — Encounter (HOSPITAL_COMMUNITY): Payer: Self-pay | Admitting: *Deleted

## 2012-10-15 DIAGNOSIS — E039 Hypothyroidism, unspecified: Secondary | ICD-10-CM

## 2012-10-15 DIAGNOSIS — R0600 Dyspnea, unspecified: Secondary | ICD-10-CM

## 2012-10-15 DIAGNOSIS — J9601 Acute respiratory failure with hypoxia: Secondary | ICD-10-CM

## 2012-10-15 DIAGNOSIS — E43 Unspecified severe protein-calorie malnutrition: Secondary | ICD-10-CM

## 2012-10-15 DIAGNOSIS — M069 Rheumatoid arthritis, unspecified: Secondary | ICD-10-CM | POA: Diagnosis present

## 2012-10-15 DIAGNOSIS — J449 Chronic obstructive pulmonary disease, unspecified: Secondary | ICD-10-CM | POA: Diagnosis present

## 2012-10-15 DIAGNOSIS — J4489 Other specified chronic obstructive pulmonary disease: Secondary | ICD-10-CM

## 2012-10-15 DIAGNOSIS — R918 Other nonspecific abnormal finding of lung field: Secondary | ICD-10-CM

## 2012-10-15 DIAGNOSIS — E860 Dehydration: Secondary | ICD-10-CM

## 2012-10-15 DIAGNOSIS — J690 Pneumonitis due to inhalation of food and vomit: Secondary | ICD-10-CM | POA: Diagnosis present

## 2012-10-15 DIAGNOSIS — E8809 Other disorders of plasma-protein metabolism, not elsewhere classified: Secondary | ICD-10-CM | POA: Diagnosis present

## 2012-10-15 DIAGNOSIS — J189 Pneumonia, unspecified organism: Secondary | ICD-10-CM

## 2012-10-15 DIAGNOSIS — Z6837 Body mass index (BMI) 37.0-37.9, adult: Secondary | ICD-10-CM

## 2012-10-15 DIAGNOSIS — B370 Candidal stomatitis: Secondary | ICD-10-CM

## 2012-10-15 DIAGNOSIS — L0390 Cellulitis, unspecified: Secondary | ICD-10-CM

## 2012-10-15 DIAGNOSIS — R1110 Vomiting, unspecified: Secondary | ICD-10-CM

## 2012-10-15 DIAGNOSIS — D72829 Elevated white blood cell count, unspecified: Secondary | ICD-10-CM

## 2012-10-15 DIAGNOSIS — R630 Anorexia: Secondary | ICD-10-CM | POA: Diagnosis present

## 2012-10-15 DIAGNOSIS — E1129 Type 2 diabetes mellitus with other diabetic kidney complication: Secondary | ICD-10-CM | POA: Diagnosis present

## 2012-10-15 DIAGNOSIS — I509 Heart failure, unspecified: Secondary | ICD-10-CM | POA: Diagnosis present

## 2012-10-15 DIAGNOSIS — E876 Hypokalemia: Secondary | ICD-10-CM

## 2012-10-15 DIAGNOSIS — I5033 Acute on chronic diastolic (congestive) heart failure: Secondary | ICD-10-CM | POA: Diagnosis present

## 2012-10-15 DIAGNOSIS — J438 Other emphysema: Secondary | ICD-10-CM | POA: Diagnosis present

## 2012-10-15 DIAGNOSIS — E119 Type 2 diabetes mellitus without complications: Secondary | ICD-10-CM

## 2012-10-15 DIAGNOSIS — E871 Hypo-osmolality and hyponatremia: Secondary | ICD-10-CM

## 2012-10-15 DIAGNOSIS — I5032 Chronic diastolic (congestive) heart failure: Secondary | ICD-10-CM

## 2012-10-15 DIAGNOSIS — R0902 Hypoxemia: Secondary | ICD-10-CM

## 2012-10-15 DIAGNOSIS — J9620 Acute and chronic respiratory failure, unspecified whether with hypoxia or hypercapnia: Secondary | ICD-10-CM

## 2012-10-15 DIAGNOSIS — R600 Localized edema: Secondary | ICD-10-CM

## 2012-10-15 DIAGNOSIS — I2789 Other specified pulmonary heart diseases: Secondary | ICD-10-CM | POA: Diagnosis present

## 2012-10-15 DIAGNOSIS — Z9981 Dependence on supplemental oxygen: Secondary | ICD-10-CM

## 2012-10-15 DIAGNOSIS — A419 Sepsis, unspecified organism: Principal | ICD-10-CM

## 2012-10-15 HISTORY — DX: Hypothyroidism, unspecified: E03.9

## 2012-10-15 HISTORY — DX: Unspecified diastolic (congestive) heart failure: I50.30

## 2012-10-15 HISTORY — DX: Type 2 diabetes mellitus without complications: E11.9

## 2012-10-15 HISTORY — DX: Pneumonia, unspecified organism: J18.9

## 2012-10-15 HISTORY — DX: Obesity, unspecified: E66.9

## 2012-10-15 LAB — POCT I-STAT 3, ART BLOOD GAS (G3+)
Acid-Base Excess: 4 mmol/L — ABNORMAL HIGH (ref 0.0–2.0)
Bicarbonate: 28.9 mEq/L — ABNORMAL HIGH (ref 20.0–24.0)
TCO2: 30 mmol/L (ref 0–100)
pO2, Arterial: 70 mmHg — ABNORMAL LOW (ref 80.0–100.0)

## 2012-10-15 LAB — BASIC METABOLIC PANEL
BUN: 13 mg/dL (ref 6–23)
CO2: 30 mEq/L (ref 19–32)
Calcium: 9.9 mg/dL (ref 8.4–10.5)
Creatinine, Ser: 0.83 mg/dL (ref 0.50–1.10)
Glucose, Bld: 108 mg/dL — ABNORMAL HIGH (ref 70–99)

## 2012-10-15 LAB — CBC
HCT: 34 % — ABNORMAL LOW (ref 36.0–46.0)
MCH: 30.2 pg (ref 26.0–34.0)
MCHC: 33.5 g/dL (ref 30.0–36.0)
MCV: 90.2 fL (ref 78.0–100.0)
Platelets: 355 10*3/uL (ref 150–400)
RDW: 15.5 % (ref 11.5–15.5)

## 2012-10-15 LAB — PRO B NATRIURETIC PEPTIDE: Pro B Natriuretic peptide (BNP): 327.5 pg/mL — ABNORMAL HIGH (ref 0–125)

## 2012-10-15 LAB — GLUCOSE, CAPILLARY: Glucose-Capillary: 117 mg/dL — ABNORMAL HIGH (ref 70–99)

## 2012-10-15 MED ORDER — VANCOMYCIN HCL IN DEXTROSE 1-5 GM/200ML-% IV SOLN
1000.0000 mg | Freq: Once | INTRAVENOUS | Status: AC
  Administered 2012-10-15: 1000 mg via INTRAVENOUS
  Filled 2012-10-15: qty 200

## 2012-10-15 MED ORDER — DOCUSATE SODIUM 100 MG PO CAPS
100.0000 mg | ORAL_CAPSULE | Freq: Two times a day (BID) | ORAL | Status: DC
  Administered 2012-10-15 – 2012-10-26 (×19): 100 mg via ORAL
  Filled 2012-10-15 (×22): qty 1

## 2012-10-15 MED ORDER — FLUTICASONE PROPIONATE 50 MCG/ACT NA SUSP
1.0000 | Freq: Every day | NASAL | Status: DC
  Administered 2012-10-16 – 2012-10-26 (×9): 1 via NASAL
  Filled 2012-10-15: qty 16

## 2012-10-15 MED ORDER — SODIUM CHLORIDE 0.9 % IV SOLN
INTRAVENOUS | Status: AC
  Administered 2012-10-15: 1000 mL via INTRAVENOUS

## 2012-10-15 MED ORDER — VANCOMYCIN HCL 10 G IV SOLR
1250.0000 mg | Freq: Two times a day (BID) | INTRAVENOUS | Status: DC
  Administered 2012-10-16 – 2012-10-18 (×7): 1250 mg via INTRAVENOUS
  Filled 2012-10-15 (×8): qty 1250

## 2012-10-15 MED ORDER — OXYCODONE HCL 5 MG PO TABS: 5.0000 mg | ORAL_TABLET | ORAL | Status: DC | PRN

## 2012-10-15 MED ORDER — ONDANSETRON HCL 4 MG PO TABS: 4.0000 mg | ORAL_TABLET | Freq: Four times a day (QID) | ORAL | Status: DC | PRN

## 2012-10-15 MED ORDER — INSULIN ASPART 100 UNIT/ML ~~LOC~~ SOLN
0.0000 [IU] | Freq: Three times a day (TID) | SUBCUTANEOUS | Status: DC
  Administered 2012-10-20 (×2): 1 [IU] via SUBCUTANEOUS

## 2012-10-15 MED ORDER — METRONIDAZOLE IN NACL 5-0.79 MG/ML-% IV SOLN
500.0000 mg | Freq: Three times a day (TID) | INTRAVENOUS | Status: DC
  Administered 2012-10-15 – 2012-10-19 (×10): 500 mg via INTRAVENOUS
  Filled 2012-10-15 (×14): qty 100

## 2012-10-15 MED ORDER — ALBUTEROL SULFATE (5 MG/ML) 0.5% IN NEBU
5.0000 mg | INHALATION_SOLUTION | Freq: Once | RESPIRATORY_TRACT | Status: AC
  Administered 2012-10-15: 5 mg via RESPIRATORY_TRACT
  Filled 2012-10-15: qty 1

## 2012-10-15 MED ORDER — ACETAMINOPHEN 325 MG PO TABS: 650.0000 mg | ORAL_TABLET | Freq: Four times a day (QID) | ORAL | Status: DC | PRN

## 2012-10-15 MED ORDER — GUAIFENESIN ER 600 MG PO TB12
600.0000 mg | ORAL_TABLET | Freq: Two times a day (BID) | ORAL | Status: DC
  Administered 2012-10-15 – 2012-10-26 (×22): 600 mg via ORAL
  Filled 2012-10-15 (×24): qty 1

## 2012-10-15 MED ORDER — BENZONATATE 100 MG PO CAPS
100.0000 mg | ORAL_CAPSULE | Freq: Three times a day (TID) | ORAL | Status: DC
  Administered 2012-10-15 – 2012-10-24 (×24): 100 mg via ORAL
  Filled 2012-10-15 (×30): qty 1

## 2012-10-15 MED ORDER — SALINE SPRAY 0.65 % NA SOLN
1.0000 | NASAL | Status: DC | PRN
  Filled 2012-10-15 (×2): qty 44

## 2012-10-15 MED ORDER — ENOXAPARIN SODIUM 40 MG/0.4ML ~~LOC~~ SOLN
40.0000 mg | SUBCUTANEOUS | Status: DC
  Administered 2012-10-15 – 2012-10-25 (×11): 40 mg via SUBCUTANEOUS
  Filled 2012-10-15 (×13): qty 0.4

## 2012-10-15 MED ORDER — DEXTROSE 5 % IV SOLN
2.0000 g | Freq: Three times a day (TID) | INTRAVENOUS | Status: DC
  Administered 2012-10-15 – 2012-10-24 (×26): 2 g via INTRAVENOUS
  Filled 2012-10-15 (×33): qty 2

## 2012-10-15 MED ORDER — SODIUM CHLORIDE 0.9 % IV BOLUS (SEPSIS)
1000.0000 mL | Freq: Once | INTRAVENOUS | Status: AC
  Administered 2012-10-15: 1000 mL via INTRAVENOUS

## 2012-10-15 MED ORDER — ONDANSETRON HCL 4 MG/2ML IJ SOLN: 4.0000 mg | Freq: Three times a day (TID) | INTRAMUSCULAR | Status: AC | PRN

## 2012-10-15 MED ORDER — LEVALBUTEROL HCL 0.63 MG/3ML IN NEBU
0.6300 mg | INHALATION_SOLUTION | Freq: Four times a day (QID) | RESPIRATORY_TRACT | Status: DC
  Administered 2012-10-15 – 2012-10-18 (×10): 0.63 mg via RESPIRATORY_TRACT
  Filled 2012-10-15 (×16): qty 3

## 2012-10-15 MED ORDER — LORATADINE 10 MG PO TABS
10.0000 mg | ORAL_TABLET | Freq: Every day | ORAL | Status: DC
  Administered 2012-10-15 – 2012-10-25 (×12): 10 mg via ORAL
  Filled 2012-10-15 (×14): qty 1

## 2012-10-15 MED ORDER — ONDANSETRON HCL 4 MG/2ML IJ SOLN
4.0000 mg | Freq: Once | INTRAMUSCULAR | Status: AC
  Administered 2012-10-15: 4 mg via INTRAVENOUS
  Filled 2012-10-15: qty 2

## 2012-10-15 MED ORDER — LEVOTHYROXINE SODIUM 50 MCG PO TABS
50.0000 ug | ORAL_TABLET | Freq: Every day | ORAL | Status: DC
  Administered 2012-10-16 – 2012-10-26 (×11): 50 ug via ORAL
  Filled 2012-10-15 (×13): qty 1

## 2012-10-15 MED ORDER — HYDROCOD POLST-CHLORPHEN POLST 10-8 MG/5ML PO LQCR
5.0000 mL | Freq: Two times a day (BID) | ORAL | Status: DC
  Administered 2012-10-15 – 2012-10-18 (×7): 5 mL via ORAL
  Filled 2012-10-15 (×7): qty 5

## 2012-10-15 MED ORDER — ALLOPURINOL 100 MG PO TABS
100.0000 mg | ORAL_TABLET | Freq: Every day | ORAL | Status: DC
  Administered 2012-10-16 – 2012-10-26 (×10): 100 mg via ORAL
  Filled 2012-10-15 (×11): qty 1

## 2012-10-15 MED ORDER — NYSTATIN 100000 UNIT/GM EX CREA
TOPICAL_CREAM | Freq: Two times a day (BID) | CUTANEOUS | Status: DC
  Administered 2012-10-15 – 2012-10-26 (×18): via TOPICAL
  Filled 2012-10-15 (×2): qty 15

## 2012-10-15 MED ORDER — POLYETHYLENE GLYCOL 3350 17 G PO PACK
17.0000 g | PACK | Freq: Every day | ORAL | Status: DC | PRN
  Filled 2012-10-15: qty 1

## 2012-10-15 MED ORDER — MONTELUKAST SODIUM 10 MG PO TABS
10.0000 mg | ORAL_TABLET | Freq: Every day | ORAL | Status: DC
  Administered 2012-10-15 – 2012-10-25 (×11): 10 mg via ORAL
  Filled 2012-10-15 (×13): qty 1

## 2012-10-15 MED ORDER — ALBUTEROL SULFATE (5 MG/ML) 0.5% IN NEBU: 2.5000 mg | INHALATION_SOLUTION | RESPIRATORY_TRACT | Status: AC | PRN

## 2012-10-15 MED ORDER — LEVALBUTEROL HCL 0.63 MG/3ML IN NEBU
0.6300 mg | INHALATION_SOLUTION | RESPIRATORY_TRACT | Status: DC | PRN
  Administered 2012-10-16: 0.63 mg via RESPIRATORY_TRACT
  Filled 2012-10-15: qty 3

## 2012-10-15 MED ORDER — FOLIC ACID 1 MG PO TABS
2.0000 mg | ORAL_TABLET | Freq: Every day | ORAL | Status: DC
  Administered 2012-10-16 – 2012-10-24 (×9): 2 mg via ORAL
  Administered 2012-10-25: 1 mg via ORAL
  Administered 2012-10-26: 2 mg via ORAL
  Filled 2012-10-15 (×3): qty 2
  Filled 2012-10-15: qty 1
  Filled 2012-10-15 (×8): qty 2

## 2012-10-15 MED ORDER — POTASSIUM CHLORIDE CRYS ER 20 MEQ PO TBCR
20.0000 meq | EXTENDED_RELEASE_TABLET | Freq: Two times a day (BID) | ORAL | Status: DC
  Administered 2012-10-15 – 2012-10-17 (×5): 20 meq via ORAL
  Filled 2012-10-15 (×8): qty 1

## 2012-10-15 MED ORDER — ONDANSETRON HCL 4 MG/2ML IJ SOLN
4.0000 mg | Freq: Four times a day (QID) | INTRAMUSCULAR | Status: DC | PRN
  Administered 2012-10-19: 4 mg via INTRAVENOUS
  Filled 2012-10-15 (×2): qty 2

## 2012-10-15 MED ORDER — MULTIVITAMINS PO CAPS: 1.0000 | ORAL_CAPSULE | Freq: Every day | ORAL | Status: DC

## 2012-10-15 MED ORDER — ACETAMINOPHEN 650 MG RE SUPP: 650.0000 mg | Freq: Four times a day (QID) | RECTAL | Status: DC | PRN

## 2012-10-15 MED ORDER — POTASSIUM CHLORIDE IN NACL 20-0.9 MEQ/L-% IV SOLN
INTRAVENOUS | Status: DC
  Administered 2012-10-15: 1000 mL via INTRAVENOUS
  Administered 2012-10-18: 10 mL/h via INTRAVENOUS
  Filled 2012-10-15 (×5): qty 1000

## 2012-10-15 MED ORDER — ADULT MULTIVITAMIN W/MINERALS CH
1.0000 | ORAL_TABLET | Freq: Every day | ORAL | Status: DC
  Administered 2012-10-16 – 2012-10-26 (×11): 1 via ORAL
  Filled 2012-10-15 (×12): qty 1

## 2012-10-15 MED ORDER — BUDESONIDE 0.25 MG/2ML IN SUSP
0.2500 mg | Freq: Two times a day (BID) | RESPIRATORY_TRACT | Status: DC
  Administered 2012-10-15 – 2012-10-26 (×21): 0.25 mg via RESPIRATORY_TRACT
  Filled 2012-10-15 (×25): qty 2

## 2012-10-15 MED ORDER — FUROSEMIDE 20 MG PO TABS: 20.0000 mg | ORAL_TABLET | Freq: Every day | ORAL | Status: DC

## 2012-10-15 MED ORDER — IPRATROPIUM BROMIDE 0.02 % IN SOLN
0.5000 mg | Freq: Four times a day (QID) | RESPIRATORY_TRACT | Status: DC
  Administered 2012-10-15 – 2012-10-18 (×10): 0.5 mg via RESPIRATORY_TRACT
  Filled 2012-10-15 (×11): qty 2.5

## 2012-10-15 MED ORDER — METHOTREXATE 2.5 MG PO TABS: 7.5000 mg | ORAL_TABLET | ORAL | Status: DC

## 2012-10-15 MED ORDER — IPRATROPIUM BROMIDE 0.02 % IN SOLN
0.5000 mg | Freq: Once | RESPIRATORY_TRACT | Status: AC
  Administered 2012-10-15: 0.5 mg via RESPIRATORY_TRACT
  Filled 2012-10-15: qty 2.5

## 2012-10-15 MED ORDER — FAMOTIDINE 20 MG PO TABS
20.0000 mg | ORAL_TABLET | Freq: Two times a day (BID) | ORAL | Status: DC
  Administered 2012-10-15 – 2012-10-22 (×14): 20 mg via ORAL
  Filled 2012-10-15 (×15): qty 1

## 2012-10-15 MED ORDER — FUROSEMIDE 40 MG PO TABS
40.0000 mg | ORAL_TABLET | Freq: Every day | ORAL | Status: DC
  Filled 2012-10-15: qty 1

## 2012-10-15 MED ORDER — MORPHINE SULFATE 2 MG/ML IJ SOLN: 2.0000 mg | INTRAMUSCULAR | Status: DC | PRN

## 2012-10-15 NOTE — ED Notes (Signed)
Patient transported to X-ray 

## 2012-10-15 NOTE — Consult Note (Addendum)
ANTIBIOTIC CONSULT NOTE  Pharmacy Consult for Cefepime Indication: pneumonia  Hospital Problems Active Problems:   * No active hospital problems. *   Height: 64 inches Weight: 101.4 kg  Vitals: BP 123/64  Pulse 121  Temp(Src) 98.9 F (37.2 C) (Oral)  Resp 29  Ht 5' 4.17" (1.63 m)  Wt 223 lb 8.7 oz (101.4 kg)  BMI 38.16 kg/m2  SpO2 98%  Labs:  Recent Labs  10/15/12 1200  WBC 21.9*  HGB 11.4*  PLT 355  CREATININE 0.83   Estimated Creatinine Clearance: 82.7 ml/min (by C-G formula based on Cr of 0.83).  Past Medical History: Past Medical History  Diagnosis Date  . Asthma   . Rheumatoid arthritis   . DM (diabetes mellitus)   . Thyroid disease   . Emphysema     pt denies any O2 use at this time/rm  . Emphysema   . Pneumonia    Past Surgical History: Past Surgical History  Procedure Laterality Date  . Eye surgery  at age 12  . Tubal ligation    . Colonoscopy       Microbiology: Recent Results (from the past 720 hour(s))  MRSA PCR SCREENING     Status: None   Collection Time    09/20/12  6:17 PM      Result Value Range Status   MRSA by PCR NEGATIVE  NEGATIVE Final    Anti-infectives Anti-infectives   Start     Dose/Rate Route Frequency Ordered Stop   10/15/12 1445  ceFEPIme (MAXIPIME) 2 g in dextrose 5 % 50 mL IVPB     2 g 100 mL/hr over 30 Minutes Intravenous 3 times per day 10/15/12 1443     10/15/12 1430  vancomycin (VANCOCIN) IVPB 1000 mg/200 mL premix     1,000 mg 200 mL/hr over 60 Minutes Intravenous  Once 10/15/12 1421       Assessment:  62 y/o female recently admitted to the hospital for PNA.  Today she was seen by her PCP and was sent to the ED.  In the office she was tachycardic and had an O2 sat 87 %.  She is to be placed on Cefepime for treatment of PNA.  She is currently receiving 1 dose of Vancomycin.  Estimated CrCl 83 ml/min.  WBC 21.9.  Goal of Therapy:  Antibiotics selected for infection/cultures and adjusted for renal or  hepatic function.   Plan:   Cefepime 2 gm IV q 8 hours. Follow up SCr, UOP, cultures, clinical course and adjust as clinically indicated.   Laurena Bering, Pharm.D.  10/15/2012 2:44 PM   Addendum: Pharmacy consulted to begin vancomycin in addition to cefepime for PNA. Vancomycin 1000 mg IV given at 14:30.   Goal: vancomycin trough 15-20  Plan: -Vancomycin 1250 mg IV q12h - next dose at midnight -F/U renal function and clinical progress  [facility], [address].D., BCPS Clinical Pharmacist Pager: [phone] 10/15/2012 5:52 PM

## 2012-10-15 NOTE — ED Notes (Signed)
RT in to give breathing tx.  RN had already started.

## 2012-10-15 NOTE — ED Notes (Signed)
Pt reports recently being admitted to hospital for pneumonia. Went to pcp today and sent here due to HR 125 and spo2 87%. At triage, pt arrived on O2 3L [place], spo2 89%. Pt reports no appetite and nausea since being dc home.

## 2012-10-15 NOTE — ED Provider Notes (Signed)
History     CSN: [id number]  Arrival date & time 10/15/12  1112   First MD Initiated Contact with Patient 10/15/12 1203      Chief Complaint  Patient presents with  . Pneumonia  . Dehydration    (Consider location/radiation/quality/duration/timing/severity/associated sxs/prior treatment) Patient is a 62 y.o. female presenting with shortness of breath. The history is provided by the patient.  Shortness of Breath Severity:  Severe Onset quality:  Gradual Duration:  8 days Timing:  Constant Progression:  Worsening Chronicity:  Recurrent Context: activity   Relieved by:  Inhaler, oxygen and rest Worsened by:  Activity and coughing Associated symptoms: cough, sputum production, vomiting and wheezing   Associated symptoms: no abdominal pain, no chest pain, no diaphoresis, no fever, no headaches, no neck pain and no swollen glands   Vomiting:    Quality:  Stomach contents   Number of occurrences:  2-3 times a day but always vomits mucous in the morning   Severity:  Severe   Duration:  8 days   Timing:  Intermittent   Progression:  Unchanged Risk factors: no hx of PE/DVT and no tobacco use   Risk factors comment:  History of COPD and chronically 3 L of oxygen. Former smoker who quit in 2008. Hospitalized approximately one month ago for COPD exacerbation   Past Medical History  Diagnosis Date  . Asthma   . Rheumatoid arthritis   . DM (diabetes mellitus)   . Thyroid disease   . Emphysema     pt denies any O2 use at this time/rm  . Emphysema   . Pneumonia     Past Surgical History  Procedure Laterality Date  . Eye surgery  at age 61  . Tubal ligation    . Colonoscopy      Family History  Problem Relation Age of Onset  . Asthma Mother   . Heart disease Mother   . Clotting disorder Mother   . Colon cancer Neg Hx   . Heart attack Father     History  Substance Use Topics  . Smoking status: Former Smoker -- 3.00 packs/day for 43 years    Types: Cigarettes    Quit  date: 04/24/2007  . Smokeless tobacco: Never Used     Comment: electronic cigarette daily  . Alcohol Use: Yes     Comment: occasional    OB History   Grav Para Term Preterm Abortions TAB SAB Ect Mult Living                  Review of Systems  Constitutional: Negative for fever and diaphoresis.  HENT: Negative for neck pain.   Respiratory: Positive for cough, sputum production, shortness of breath and wheezing.   Cardiovascular: Negative for chest pain.  Gastrointestinal: Positive for vomiting. Negative for abdominal pain.  Neurological: Negative for headaches.  All other systems reviewed and are negative.    Allergies  Penicillins; Sulfonamide derivatives; and Tetanus toxoid  Home Medications   Current Outpatient Rx  Name  Route  Sig  Dispense  Refill  . albuterol (PROVENTIL) (2.5 MG/3ML) 0.083% nebulizer solution   Nebulization   Take 2.5 mg by nebulization every 6 (six) hours as needed for wheezing.         Marland Kitchen allopurinol (ZYLOPRIM) 100 MG tablet   Oral   Take 1 tablet by mouth Daily.         Marland Kitchen arformoterol (BROVANA) 15 MCG/2ML NEBU   Nebulization   Take 2 mLs (15  mcg total) by nebulization 2 (two) times daily. Dx: 491.20   120 mL   6   . Ascorbic Acid (VITAMIN C) 1000 MG tablet   Oral   Take 1,000 mg by mouth daily.         . budesonide (PULMICORT) 0.25 MG/2ML nebulizer solution   Nebulization   Take 2 mLs (0.25 mg total) by nebulization 2 (two) times daily. Dx: 491.20   120 mL   6   . cetirizine (ZYRTEC) 10 MG tablet   Oral   Take 1 tablet (10 mg total) by mouth daily.   30 tablet   0   . Cholecalciferol (VITAMIN D-3) 5000 UNITS TABS   Oral   Take 1 tablet by mouth daily.         . Cyanocobalamin (VITAMIN B12 PO)   Oral   Take 1 tablet by mouth every Monday, Wednesday, and Friday.          . fluticasone (FLONASE) 50 MCG/ACT nasal spray   Nasal   Place 1 spray into the nose daily.   16 g   0   . folic acid (FOLVITE) 1 MG tablet    Oral   Take 2 mg by mouth daily.          . furosemide (LASIX) 40 MG tablet   Oral   Take 40 mg by mouth daily.          Marland Kitchen guaiFENesin (MUCINEX) 600 MG 12 hr tablet   Oral   Take 1 tablet (600 mg total) by mouth 2 (two) times daily.   20 tablet   0   . levothyroxine (SYNTHROID, LEVOTHROID) 50 MCG tablet   Oral   Take 1 tablet by mouth Daily.         Marland Kitchen MAGNESIUM-ZINC PO   Oral   Take 2 tablets by mouth daily.          . methotrexate (RHEUMATREX) 2.5 MG tablet   Oral   Take 7.5 mg by mouth once a week. Thursdays         . montelukast (SINGULAIR) 10 MG tablet   Oral   Take 1 tablet by mouth daily.         . Multiple Vitamin (MULTIVITAMIN) capsule   Oral   Take 1 capsule by mouth daily.         Marland Kitchen nystatin cream (MYCOSTATIN)   Topical   Apply topically 2 (two) times daily. To area under breasts   30 g   0   . polyethylene glycol (MIRALAX / GLYCOLAX) packet   Oral   Take 17 g by mouth daily as needed.   14 each   0   . potassium chloride SA (K-DUR,KLOR-CON) 20 MEQ tablet      3 tabs by mouth once daily         . sodium chloride (OCEAN) 0.65 % SOLN nasal spray   Nasal   Place 1 spray into the nose as needed for congestion.   30 mL   0   . STUDY MEDICATION      Vertrex104 study drug for rheumatoid arthritis 150 mg daily         . tiotropium (SPIRIVA HANDIHALER) 18 MCG inhalation capsule   Inhalation   Place 1 capsule (18 mcg total) into inhaler and inhale daily.   30 capsule   11     BP 119/50  Pulse 124  Temp(Src) 98.9 F (37.2 C) (Oral)  Resp 26  SpO2 96%  Physical Exam  Nursing note and vitals reviewed. Constitutional: She is oriented to person, place, and time. She appears well-developed and well-nourished. No distress.  HENT:  Head: Normocephalic and atraumatic.  Mouth/Throat: Oropharynx is clear and moist. Mucous membranes are dry.  Eyes: Conjunctivae and EOM are normal. Pupils are equal, round, and reactive to light.   Neck: Normal range of motion. Neck supple.  Cardiovascular: Regular rhythm and intact distal pulses.  Tachycardia present.   No murmur heard. Pulmonary/Chest: No accessory muscle usage. Tachypnea noted. No respiratory distress. She has decreased breath sounds. She has wheezes. She has no rales.  Abdominal: Soft. She exhibits no distension. There is no tenderness. There is no rebound and no guarding.  Musculoskeletal: Normal range of motion. She exhibits no edema and no tenderness.  Neurological: She is alert and oriented to person, place, and time.  Skin: Skin is warm and dry. No rash noted. No erythema.  Psychiatric: She has a normal mood and affect. Her behavior is normal.    ED Course  Procedures (including critical care time)  Labs Reviewed  PRO B NATRIURETIC PEPTIDE - Abnormal; Notable for the following:    Pro B Natriuretic peptide (BNP) 327.5 (*)    All other components within normal limits  BASIC METABOLIC PANEL - Abnormal; Notable for the following:    Sodium 131 (*)    Chloride 89 (*)    Glucose, Bld 108 (*)    GFR calc non Af Amer 75 (*)    GFR calc Af Amer 86 (*)    All other components within normal limits  CBC - Abnormal; Notable for the following:    WBC 21.9 (*)    RBC 3.77 (*)    Hemoglobin 11.4 (*)    HCT 34.0 (*)    All other components within normal limits  LIPASE, BLOOD  POCT I-STAT TROPONIN I   Dg Chest 2 View  10/15/2012  *RADIOLOGY REPORT*  Clinical Data: Pneumonia, dehydration  CHEST - 2 VIEW  Comparison: 10/06/2012  Findings: Cardiomediastinal silhouette is stable.  Diffuse bilateral infiltrates are again noted.  Worsening infiltrate/consolidation in the right upper lobe and right perihilar region.  Follow-up to resolution after treatment is recommended.  IMPRESSION: Diffuse bilateral infiltrates again noted.  Worsening infiltrates/consolidation in the right upper lobe and right perihilar region consistent with worsening pneumonia.  Follow-up to  resolution after treatment is recommended.   Original Report Authenticated By: Natasha Mead, M.D.      Date: 10/15/2012  Rate: 120  Rhythm: sinus tachycardia  QRS Axis: normal  Intervals: normal  ST/T Wave abnormalities: normal  Conduction Disutrbances:none  Narrative Interpretation:   Old EKG Reviewed: unchanged    1. Healthcare-associated pneumonia       MDM   Patient sent by her PCP today do to worsening shortness of breath and vomiting. She states the last one week the only thing she's been able to keep down his butter milk and water. She's also had a worsening cough which is only productive in the mornings. She has a history of COPD and has been using her inhalers at home as well as her 3 L of home oxygen. She states when she wakes up in the morning her oxygen is in the 70s. Today upon arrival on her 3 L she satting 89% and has a heart rate in the 120s. On exam she has globally decreased breath sounds with minimal wheezing bilaterally. She is also mildly tachypneic on exam but has  no chest pain or abdominal pain. She's awake and alert has no meningeal signs. She has no signs of fluid overload such as JVD or edema in the lower extremities.  Concern for infectious etiology versus COPD exacerbation. Patient given IV fluids, Zofran and albuterol and Atrovent. CBC, BMP, BNP, EKG and chest x-ray ordered from triage.  2:39 PM Patient with persistent leukocytosis of 21,008 and stable CMP however chest x-ray today shows a worsening right upper lobe pneumonia. Patient had been placed on Levaquin by PCP on 10/04/2012 however she states has caused her to vomit in she's not taking any longer. Patient started on vancomycin and cefepime due to penicillin allergy. Will be admitted to a step down bed.      Gwyneth Sprout, MD 10/15/12 1439

## 2012-10-15 NOTE — H&P (Signed)
[facility] History and Physical  FANTASIA JINKINS EAV:[id number] DOB: 09/15/1950 DOA: 10/15/2012  Referring physician: ED physician, Dr. Anitra Lauth. PCP: Nadean Corwin, MD  Specialists: PCCM, Dr. Shan Levans  Chief Complaint: Worsening shortness of breath and cough.  HPI: NAZYIA GAUGH is a 62 y.o. female with a history significant for oxygen-dependent COPD/ emphysema, rheumatoid arthritis, and diabetes mellitus, who presents to the emergency department today for chief complaint of shortness of breath and coughing. She was recently hospitalized from 09/20/2012 through 09/23/2012 for COPD with exacerbation. She was discharged on doxycycline and a prednisone taper which she completed. She was seen by Dr. Lynelle Doctor NP, Tammy Parrett on 10/06/2012. During that visit, Advair was discontinued secondary to issues with her insurance coverage. She was started on budesonide nebulizer twice daily, Brovana nebulizer twice daily, and continued on Spiriva. Also, Lasix was increased to 40 mg daily for chronic lower extremity edema/chronic diastolic heart failure. Prior to the visit with his parents, the patient had been started on Levaquin by her primary care physician approximately a week after the hospital discharge, however, she could not tolerate the Levaquin due to nausea and vomiting. The patient was seen again by her primary care physician today in his office. She has been complaining of a worsening nonproductive cough, right-sided pleurisy, chills, worsening shortness of breath, and several episodes of post tussive vomiting. Apparently, it was reported that her oxygen saturation was in the 80s on 3 L of oxygen. Dr. Oneta Rack instructed the patient to come to the emergency department.  In the emergency department, she is tachycardic with a heart rate ranging in the 120s. Her blood pressure is within normal limits. She was initially oxygenating 89% on 3 L of nasal cannula oxygen. Her EKG reveals sinus  tachycardia with a heart rate of 120 beats per minute. Her chest x-ray reveals diffuse bilateral infiltrates, worsening infiltrate/consolidation in the right upper lobe and right perihilar region consistent with worsening pneumonia. Her white blood cell count is elevated at 22. Her pro BNP is modestly elevated at 328. Later, an ABG was ordered per my request. It revealed a pH of 7.45, PCO2 of 41.6, and PO2 of 70 on 3 L of oxygen. She is being admitted for further evaluation and management.   Review of Systems: Positive as above in history present illness. In addition, she has had generalized weakness, occasional loose stools. Review of systems is negative for subjective fever, abdominal pain, and otherwise negative.  Past Medical History  Diagnosis Date  . Asthma   . Rheumatoid arthritis   . Emphysema     02 dependent 3L/min  . Emphysema   . Pneumonia   . Hypothyroidism   . Diabetes mellitus, type II   . Obesity    Past Surgical History  Procedure Laterality Date  . Eye surgery  at age 70  . Tubal ligation    . Colonoscopy     Social History: She is married. She has 2 children. She quit smoking 5 years ago. She has a 129 pack year history, previously smoking 3 packs of cigarettes per day. She drinks alcohol on occasion. She denies illicit drug use.   Allergies  Allergen Reactions  . Penicillins Rash  . Sulfonamide Derivatives Rash  . Tetanus Toxoid Other (See Comments)    Knot on arm    Family History  Problem Relation Age of Onset  . Asthma Mother   . Heart disease Mother   . Clotting disorder Mother   . Colon cancer  Neg Hx   . Heart attack Father      Prior to Admission medications   Medication Sig Start Date End Date Taking? Authorizing Provider  albuterol (PROVENTIL) (2.5 MG/3ML) 0.083% nebulizer solution Take 2.5 mg by nebulization every 6 (six) hours as needed for wheezing.    Historical Provider, MD  allopurinol (ZYLOPRIM) 100 MG tablet Take 1 tablet by mouth Daily.     Historical Provider, MD  arformoterol (BROVANA) 15 MCG/2ML NEBU Take 2 mLs (15 mcg total) by nebulization 2 (two) times daily. Dx: 491.20 10/06/12   Tammy S Parrett, NP  Ascorbic Acid (VITAMIN C) 1000 MG tablet Take 1,000 mg by mouth daily.    Historical Provider, MD  budesonide (PULMICORT) 0.25 MG/2ML nebulizer solution Take 2 mLs (0.25 mg total) by nebulization 2 (two) times daily. Dx: 491.20 10/06/12   Tammy S Parrett, NP  cetirizine (ZYRTEC) 10 MG tablet Take 1 tablet (10 mg total) by mouth daily. 09/23/12   Renae Fickle, MD  Cholecalciferol (VITAMIN D-3) 5000 UNITS TABS Take 1 tablet by mouth daily.    Historical Provider, MD  Cyanocobalamin (VITAMIN B12 PO) Take 1 tablet by mouth every Monday, Wednesday, and Friday.     Historical Provider, MD  fluticasone (FLONASE) 50 MCG/ACT nasal spray Place 1 spray into the nose daily. 09/23/12   Renae Fickle, MD  folic acid (FOLVITE) 1 MG tablet Take 2 mg by mouth daily.     Historical Provider, MD  furosemide (LASIX) 40 MG tablet Take 40 mg by mouth daily.     Historical Provider, MD  guaiFENesin (MUCINEX) 600 MG 12 hr tablet Take 1 tablet (600 mg total) by mouth 2 (two) times daily. 09/23/12   Renae Fickle, MD  levothyroxine (SYNTHROID, LEVOTHROID) 50 MCG tablet Take 1 tablet by mouth Daily. 05/04/12   Historical Provider, MD  MAGNESIUM-ZINC PO Take 2 tablets by mouth daily.     Historical Provider, MD  methotrexate (RHEUMATREX) 2.5 MG tablet Take 7.5 mg by mouth once a week. Thursdays    Historical Provider, MD  montelukast (SINGULAIR) 10 MG tablet Take 1 tablet by mouth daily.    Historical Provider, MD  Multiple Vitamin (MULTIVITAMIN) capsule Take 1 capsule by mouth daily.    Historical Provider, MD  nystatin cream (MYCOSTATIN) Apply topically 2 (two) times daily. To area under breasts 09/23/12   Renae Fickle, MD  polyethylene glycol [facility] / GLYCOLAX) packet Take 17 g by mouth daily as needed. 09/23/12   Renae Fickle, MD  potassium  chloride SA (K-DUR,KLOR-CON) 20 MEQ tablet 3 tabs by mouth once daily 09/23/12   Renae Fickle, MD  sodium chloride (OCEAN) 0.65 % SOLN nasal spray Place 1 spray into the nose as needed for congestion. 09/23/12   Renae Fickle, MD  STUDY MEDICATION Vertrex104 study drug for rheumatoid arthritis 150 mg daily    Historical Provider, MD  tiotropium (SPIRIVA HANDIHALER) 18 MCG inhalation capsule Place 1 capsule (18 mcg total) into inhaler and inhale daily. 03/03/12   Storm Frisk, MD   Physical Exam: Filed Vitals:   10/15/12 1308 10/15/12 1429 10/15/12 1532 10/15/12 1645  BP: 123/64   112/65  Pulse: 121  122 115  Temp:    98.6 F (37 C)  TempSrc:    Oral  Resp: 29  26 26   Height:  5' 4.17" (1.63 m)  5\' 4"  (1.626 m)  Weight:  101.4 kg (223 lb 8.7 oz)  97.4 kg (214 lb 11.7 oz)  SpO2: 98%  91%  96%     General:  Pleasant alert obese 62 year old Caucasian woman laying in bed, in no acute distress.  Eyes: Pupils are equal, round, and reactive to light. Extraocular movements are intact. Conjunctivae are clear. Sclerae are white.  ENT: Oropharynx reveals mildly dry mucous membranes. No posterior exudates or erythema.  Neck: Supple, obese, no thyromegaly, no JVD.  Cardiovascular: S1, S2, with tachycardia.  Respiratory: No audible wheezes. Occasional crackles in the upper lobes bilaterally. Breathing nonlabored at rest, but slightly labored with speaking.   Abdomen: obese, positive bowel sounds, soft, nontender, nondistended.   Skin: Fair turgor. No rashes.  Musculoskeletal: no acute hot red joints. Trace lower extremity edema, nonpitting  Psychiatric: she is alert and oriented x3. Speech is clear.   Neurologic: cranial nerves II through XII are grossly intact. Strength is grossly 5 over 5.   Labs on Admission:  Basic Metabolic Panel:  Recent Labs Lab 10/15/12 1200  NA 131*  K 3.8  CL 89*  CO2 30  GLUCOSE 108*  BUN 13  CREATININE 0.83  CALCIUM 9.9   Liver Function  Tests: No results found for this basename: AST, ALT, ALKPHOS, BILITOT, PROT, ALBUMIN,  in the last 168 hours  Recent Labs Lab 10/15/12 1452  LIPASE 17   No results found for this basename: AMMONIA,  in the last 168 hours CBC:  Recent Labs Lab 10/15/12 1200  WBC 21.9*  HGB 11.4*  HCT 34.0*  MCV 90.2  PLT 355   Cardiac Enzymes: No results found for this basename: CKTOTAL, CKMB, CKMBINDEX, TROPONINI,  in the last 168 hours  BNP (last 3 results)  Recent Labs  09/20/12 1316 10/15/12 1200  PROBNP 333.7* 327.5*   CBG: No results found for this basename: GLUCAP,  in the last 168 hours  Radiological Exams on Admission: Dg Chest 2 View  10/15/2012  *RADIOLOGY REPORT*  Clinical Data: Pneumonia, dehydration  CHEST - 2 VIEW  Comparison: 10/06/2012  Findings: Cardiomediastinal silhouette is stable.  Diffuse bilateral infiltrates are again noted.  Worsening infiltrate/consolidation in the right upper lobe and right perihilar region.  Follow-up to resolution after treatment is recommended.  IMPRESSION: Diffuse bilateral infiltrates again noted.  Worsening infiltrates/consolidation in the right upper lobe and right perihilar region consistent with worsening pneumonia.  Follow-up to resolution after treatment is recommended.   Original Report Authenticated By: Natasha Mead, M.D.     EKG: sinus tachycardia with a heart rate of 120 beats per minute.   Assessment/Plan Principal Problem:   HCAP (healthcare-associated pneumonia) Active Problems:   Acute and chronic respiratory failure   COPD, severe   Hypoxemia   Diastolic heart failure   Hyponatremia   Hypothyroidism   Diabetes mellitus, type II   Morbid obesity   Post-tussive vomiting   1. Healthcare associated pneumonia. There is a high suspicion for aspiration pneumonia. Her children mentions that she coughs frequently while eating, however, the patient appears to have a chronic cough. Her white blood cell count is markedly elevated.  She finished a course of prednisone several weeks ago and has not been restarted on prednisone within the last week or 2. She is afebrile. 2. Severe oxygen-dependent COPD. She has no significant bronchospasms on exam. Oxygen will be continued. 3. Acute on chronic hypoxic respiratory failure secondary to #1 and #2. She was initially hypoxic, but the ABG results were somewhat reassuring. 4. Nausea and posttussive vomiting. She has no abdominal pain. Her abdominal exam is benign. 5. Hyponatremia. Not sure if this is  SIADH with her significant lung disease or mild volume depletion. She is on Lasix chronically for treatment of chronic diastolic heart failure. 6. Chronic diastolic heart failure. Currently stable and compensated. 7. Type 2 diabetes mellitus. Currently controlled on diet alone.       Plan:  1. The patient was given vancomycin and cefepime in the emergency department. Apparently she had some nausea and vomiting associated with Levaquin recently. We will continue vancomycin and cefepime. Because there is a high suspicion for aspiration, we'll add IV Flagyl as tolerated. 2. Continue bronchodilators including Pulmicort nebulizer, ipratropium nebulizer, and change albuterol to Xopenex given her tachycardia. 3. Consider pulmonary consultation if needed in the hospital. 4. We'll treat her cough with Tessalon Perles and Tussionex. 5. We'll continue oxygen titrated to keep her oxygen saturations in the 90-92% range as a minimum. 6. Will start gentle IV fluids, but will continue Lasix. Of note, the patient will be receiving fluids with 3 intravenous antibiotics. 7. We'll start a full liquid diet. We'll start twice a day oral Pepcid empirically. 8. We'll hold methotrexate and a study drug for RA. 9. We'll order a urine Legionella antigen and strep pneumo antigen.   Code Status: Full code as discussed with the patient and daughters.  Family Communication: discussed with  daughters. Disposition Plan: to be determined.   Time spent: greater than one hour.   [facility] Pager [phone]   If 7PM-7AM, please contact night-coverage [email or web address] Password [facility] 10/15/2012, 5:49 PM

## 2012-10-16 DIAGNOSIS — E119 Type 2 diabetes mellitus without complications: Secondary | ICD-10-CM

## 2012-10-16 LAB — COMPREHENSIVE METABOLIC PANEL
ALT: 17 U/L (ref 0–35)
AST: 39 U/L — ABNORMAL HIGH (ref 0–37)
Albumin: 1.5 g/dL — ABNORMAL LOW (ref 3.5–5.2)
CO2: 25 mEq/L (ref 19–32)
Calcium: 8.5 mg/dL (ref 8.4–10.5)
Chloride: 98 mEq/L (ref 96–112)
Creatinine, Ser: 0.82 mg/dL (ref 0.50–1.10)
GFR calc non Af Amer: 76 mL/min — ABNORMAL LOW (ref >90)
Sodium: 132 mEq/L — ABNORMAL LOW (ref 135–145)
Total Bilirubin: 0.3 mg/dL (ref 0.3–1.2)

## 2012-10-16 LAB — GLUCOSE, CAPILLARY
Glucose-Capillary: 107 mg/dL — ABNORMAL HIGH (ref 70–99)
Glucose-Capillary: 110 mg/dL — ABNORMAL HIGH (ref 70–99)

## 2012-10-16 LAB — CBC
MCV: 90.4 fL (ref 78.0–100.0)
Platelets: 315 10*3/uL (ref 150–400)
RBC: 3.53 MIL/uL — ABNORMAL LOW (ref 3.87–5.11)
RDW: 15.8 % — ABNORMAL HIGH (ref 11.5–15.5)
WBC: 24.1 10*3/uL — ABNORMAL HIGH (ref 4.0–10.5)

## 2012-10-16 LAB — HEMOGLOBIN A1C: Hgb A1c MFr Bld: 5.8 % — ABNORMAL HIGH (ref <5.7)

## 2012-10-16 LAB — STREP PNEUMONIAE URINARY ANTIGEN: Strep Pneumo Urinary Antigen: NEGATIVE

## 2012-10-16 LAB — LEGIONELLA ANTIGEN, URINE: Legionella Antigen, Urine: NEGATIVE

## 2012-10-16 MED ORDER — BIOTENE DRY MOUTH MT LIQD
15.0000 mL | Freq: Two times a day (BID) | OROMUCOSAL | Status: DC
  Administered 2012-10-16 – 2012-10-26 (×17): 15 mL via OROMUCOSAL

## 2012-10-16 MED ORDER — PANTOPRAZOLE SODIUM 40 MG PO TBEC
40.0000 mg | DELAYED_RELEASE_TABLET | Freq: Two times a day (BID) | ORAL | Status: DC
  Administered 2012-10-16 – 2012-10-26 (×20): 40 mg via ORAL
  Filled 2012-10-16 (×21): qty 1

## 2012-10-16 MED ORDER — FUROSEMIDE 10 MG/ML IJ SOLN
40.0000 mg | Freq: Two times a day (BID) | INTRAMUSCULAR | Status: DC
  Administered 2012-10-16 – 2012-10-17 (×4): 40 mg via INTRAVENOUS
  Filled 2012-10-16 (×7): qty 4

## 2012-10-16 NOTE — Progress Notes (Signed)
[facility] PROGRESS NOTE  Jacqueline Washington ZOX:[id number] DOB: [birth year] DOA: 10/15/2012 PCP: Nadean Corwin, MD  Assessment/Plan: 1. Health care associated/aspiration pneumonia. Patient has been started on broad spectrum antibiotics and subjectively feels to be improving. Discussed with speech therapy today and there were no overt signs of aspiration on bedside evaluation.  With her recurrent admissions and worsening chest xray, I think an objective swallow study would be helpful.  Until then, we will continue pepcid and start PPI. Continue pulmonary hygiene. 2. Acute on chronic resp failure.  Patient does not feel back to baseline yet.  Still having difficulty speaking in full sentences.  Will continue current treatments. 3. Acute on Chronic diastolic CHF. Patient does have clinical evidence of volume overload.  Will stop IV fluids and change lasix to IV 4. Type 2 diabetes.  Follow blood sugars. 5. Hyponatremia.  Likely hypervolemic. Will give lasix and observe. 6. Oxygen dependent COPD.  Does not have evidence of wheezing. Continue nebulizer treatments. 7. Leukocytosis, possibly due to pneumonia, was also recently on steroids.  Continue to follow. 8. Severe protein calorie malnutrition. Likely contributing to underlying pedal edema.  Continue to follow.  Code Status: full code Family Communication: discussed with patient Disposition Plan: return home on discharge   Consultants:  Speech therapy 5/3  Procedures:  none  Antibiotics:  Cefepime 5/2  Flagyl 5/2  Vancomycin 5/2  HPI/Subjective: Breathing is improving.  Has some pleuritic chest pain after coughing under right breast line. Cough is non productive.  Objective: Filed Vitals:   10/16/12 0600 10/16/12 0700 10/16/12 0800 10/16/12 0902  BP: 99/51 96/49 95/73    Pulse: 110 112 116   Temp: 98.3 F (36.8 C)  98.2 F (36.8 C)   TempSrc: Oral  Oral   Resp: 25 24 26    Height:      Weight: 99.383 kg (219 lb  1.6 oz)     SpO2: 96% 92% 92% 98%    Intake/Output Summary (Last 24 hours) at 10/16/12 0908 Last data filed at 10/16/12 0700  Gross per 24 hour  Intake 1300.17 ml  Output      0 ml  Net 1300.17 ml   Filed Weights   10/15/12 1429 10/15/12 1645 10/16/12 0600  Weight: 101.4 kg (223 lb 8.7 oz) 97.4 kg (214 lb 11.7 oz) 99.383 kg (219 lb 1.6 oz)    Exam:   General:  Short of breath while speaking, no acute distress  Cardiovascular: S1, s2 tachycardic  Respiratory: bilateral crackles  Abdomen: soft, nt, nd, bs+  Musculoskeletal: 2+ edema b/l   Data Reviewed: Basic Metabolic Panel:  Recent Labs Lab 10/15/12 1200 10/16/12 0640  NA 131* 132*  K 3.8 4.5  CL 89* 98  CO2 30 25  GLUCOSE 108* 106*  BUN 13 9  CREATININE 0.83 0.82  CALCIUM 9.9 8.5   Liver Function Tests:  Recent Labs Lab 10/16/12 0640  AST 39*  ALT 17  ALKPHOS 123*  BILITOT 0.3  PROT 6.4  ALBUMIN 1.5*    Recent Labs Lab 10/15/12 1452  LIPASE 17   No results found for this basename: AMMONIA,  in the last 168 hours CBC:  Recent Labs Lab 10/15/12 1200 10/16/12 0640  WBC 21.9* 24.1*  HGB 11.4* 10.6*  HCT 34.0* 31.9*  MCV 90.2 90.4  PLT 355 315   Cardiac Enzymes: No results found for this basename: CKTOTAL, CKMB, CKMBINDEX, TROPONINI,  in the last 168 hours BNP (last 3 results)  Recent Labs  09/20/12 1316  10/15/12 1200  PROBNP 333.7* 327.5*   CBG:  Recent Labs Lab 10/15/12 2115 10/16/12 0726  GLUCAP 117* 107*    Recent Results (from the past 240 hour(s))  MRSA PCR SCREENING     Status: None   Collection Time    10/15/12  5:12 PM      Result Value Range Status   MRSA by PCR NEGATIVE  NEGATIVE Final   Comment:            The GeneXpert MRSA Assay (FDA     approved for NASAL specimens     only), is one component of a     comprehensive MRSA colonization     surveillance program. It is not     intended to diagnose MRSA     infection nor to guide or     monitor treatment  for     MRSA infections.     Studies: Dg Chest 2 View  10/15/2012  *RADIOLOGY REPORT*  Clinical Data: Pneumonia, dehydration  CHEST - 2 VIEW  Comparison: 10/06/2012  Findings: Cardiomediastinal silhouette is stable.  Diffuse bilateral infiltrates are again noted.  Worsening infiltrate/consolidation in the right upper lobe and right perihilar region.  Follow-up to resolution after treatment is recommended.  IMPRESSION: Diffuse bilateral infiltrates again noted.  Worsening infiltrates/consolidation in the right upper lobe and right perihilar region consistent with worsening pneumonia.  Follow-up to resolution after treatment is recommended.   Original Report Authenticated By: Natasha Mead, M.D.     Scheduled Meds: . allopurinol  100 mg Oral Daily  . antiseptic oral rinse  15 mL Mouth Rinse BID  . benzonatate  100 mg Oral TID  . budesonide  0.25 mg Nebulization BID  . ceFEPIme (MAXIPIME) 2 GM IVP  2 g Intravenous Q8H  . chlorpheniramine-HYDROcodone  5 mL Oral Q12H  . docusate sodium  100 mg Oral BID  . enoxaparin (LOVENOX) injection  40 mg Subcutaneous Q24H  . famotidine  20 mg Oral BID  . fluticasone  1 spray Each Nare Daily  . folic acid  2 mg Oral Daily  . furosemide  40 mg Oral Daily  . guaiFENesin  600 mg Oral BID  . insulin aspart  0-9 Units Subcutaneous TID WC  . ipratropium  0.5 mg Nebulization Q6H  . levalbuterol  0.63 mg Nebulization Q6H  . levothyroxine  50 mcg Oral QAC breakfast  . loratadine  10 mg Oral QHS  . metronidazole  500 mg Intravenous Q8H  . montelukast  10 mg Oral QHS  . multivitamin with minerals  1 tablet Oral Daily  . nystatin cream   Topical BID  . potassium chloride SA  20 mEq Oral BID  . vancomycin  1,250 mg Intravenous Q12H   Continuous Infusions: . 0.9 % NaCl with KCl 20 mEq / L 1,000 mL (10/15/12 1859)    Principal Problem:   HCAP (healthcare-associated pneumonia) Active Problems:   Diastolic heart failure   Acute and chronic respiratory failure    COPD, severe   Hypoxemia   Hyponatremia   Hypothyroidism   Diabetes mellitus, type II   Morbid obesity   Post-tussive vomiting    Time spent:    [facility]  [facility] Pager [phone]. If 7PM-7AM, please contact night-coverage at [email or web address], password [facility] 10/16/2012, 9:08 AM  LOS: 1 day

## 2012-10-16 NOTE — Evaluation (Signed)
Clinical/Bedside Swallow Evaluation Patient Details  Name: Jacqueline Washington MRN: [id number] Date of Birth: 1950-12-16  Today's Date: 10/16/2012 Time: 4098-1191 SLP Time Calculation (min): 22 min  Past Medical History:  Past Medical History  Diagnosis Date  . Asthma   . Rheumatoid arthritis   . Emphysema     02 dependent 3L/min  . Emphysema   . Pneumonia   . Hypothyroidism   . Diabetes mellitus, type II   . Obesity    Past Surgical History:  Past Surgical History  Procedure Laterality Date  . Eye surgery  at age 63  . Tubal ligation    . Colonoscopy     HPI:  62 year old female admitted with HCAP. High suspicion for aspiration PNA per MD notes as children mention she coughs frequently during meals in addition to her chronic appearing cough. PMH of severe O2 dependent COPD. Her chest x-ray reveals diffuse bilateral infiltrates, worsening infiltrate/consolidation in the right upper lobe and right perihilar region consistent with worsening pneumonia.   Assessment / Plan / Recommendation Clinical Impression  Swallow evaluation complete. Patient presents with what appears to be a functional oropharyngeal swallow based on bedside exam. Oral phase mildly prolonged however functional with mastication of solid bolus due to SOB and missing dentition. Laryngeal elevation and excursion appears appropriate without overt s/s of aspiration. Education complete with patient regarding risk of aspiration, particularly silent aspiration with h/o COPD/GERD. Provided moderate verbal cueing for use of swallowing strategies including small bites/sips, frequent breaks to control SOB related swallowing dysfunction, no straws, and reflux precautions. Patient verbalized understanding. Also discussed results with MD. At this time, as swallow appears functional, would recommend a regular diet, thin liquid with the above precautions. If however, from a pulmonary status, it is necessary to r/o silent aspiration, would  recommend MBS to do so. MD aware. SLP will f/u briefly for diet tolerance, use of compensatory strategies, and need for further testing.     Aspiration Risk  Mild    Diet Recommendation Regular;Thin liquid   Liquid Administration via: Cup;No straw Medication Administration: Whole meds with liquid Supervision: Patient able to self feed;Intermittent supervision to cue for compensatory strategies Compensations: Slow rate;Small sips/bites (take frequent rest breaks to control SOB) Postural Changes and/or Swallow Maneuvers: Seated upright 90 degrees;Upright 30-60 min after meal    Other  Recommendations Recommended Consults: MBS (if necessary to  MD to r/o silent aspiration) Oral Care Recommendations: Oral care BID   Follow Up Recommendations  None    Frequency and Duration min 2x/week  1 week   Pertinent Vitals/Pain None reported    SLP Swallow Goals Patient will utilize recommended strategies during swallow to increase swallowing safety with: Modified independent assistance Swallow Study Goal #2 - Progress: Other (comment) (new goal)   Swallow Study    General HPI: 62 year old female admitted with HCAP. High suspicion for aspiration PNA per MD notes as children mention she coughs frequently during meals in addition to her chronic appearing cough. PMH of severe O2 dependent COPD. Her chest x-ray reveals diffuse bilateral infiltrates, worsening infiltrate/consolidation in the right upper lobe and right perihilar region consistent with worsening pneumonia. Type of Study: Bedside swallow evaluation Previous Swallow Assessment: none reported Diet Prior to this Study: Thin liquids (clear or full liquids?) Temperature Spikes Noted: Yes Respiratory Status: Supplemental O2 delivered via (comment) (3 L nasal cannula) History of Recent Intubation: No Behavior/Cognition: Alert;Cooperative;Pleasant mood Oral Cavity - Dentition: Dentures, top;Missing dentition (missing bottom  dentition)  Self-Feeding Abilities: Able to feed self Patient Positioning: Upright in chair Baseline Vocal Quality: Clear Volitional Cough: Strong Volitional Swallow: Able to elicit    Oral/Motor/Sensory Function Overall Oral Motor/Sensory Function: Appears within functional limits for tasks assessed   Ice Chips Ice chips: Not tested   Thin Liquid Thin Liquid: Within functional limits Presentation: Cup;Self Fed    Nectar Thick Nectar Thick Liquid: Not tested   Honey Thick Honey Thick Liquid: Not tested   Puree Puree: Within functional limits Presentation: Self Fed;Spoon   Solid   GO   Jacqueline Decoteau MA, CCC-SLP [phone]  Solid: Impaired Presentation: Self Fed Oral Phase Impairments: Impaired anterior to posterior transit Oral Phase Functional Implications:  (delayed oral transit due to SOB and missing dentition)       Jacqueline Washington 10/16/2012,8:21 AM

## 2012-10-16 NOTE — Progress Notes (Signed)
INITIAL NUTRITION ASSESSMENT  DOCUMENTATION CODES Per approved criteria  -Obesity Unspecified   INTERVENTION: 1.  General healthful diet; encourage intake.  Supplements to be considered based on adequacy of intake.  NUTRITION DIAGNOSIS: Inadequate oral intake related to weakness, shortness of breath as evidenced by pt report.   Monitor:  1.  Food/Beverage; improvement in intake to meet >/=90% estimated needs  Reason for Assessment: MST  62 y.o. female  Admitting Dx: HCAP (healthcare-associated pneumonia)  ASSESSMENT: Pt admitted with shortness of breath. Pt states her wt is variable due to fluid status.  Denies recent wt change.   Pt reports good appetite but decreased ability for self-care at home affecting intake.  Pt states her husband leaves at 2pm for work and pt will often not eat meals after he leaves.  Often eats 1 meal/day- cereal in the morning if unable to "get up and fix myself something."  Denies snacks or convenience foods.  Pt reports "I'm not a diabetic."   Lab Results  Component Value Date   HGBA1C 5.8* 10/15/2012  Previous h/o adequate glucose control with diet.   Assessed by SLP this am who recommend Regular diet, thin liquids.  MD has diagnosed with severe malnutrition of chronic illness.  RD suspects decreased intake at home, however time frame given by pt for recent intake changes is vague.  No evidence of recent wt loss which may be masked by fluid status.  RD to follow.     Height: Ht Readings from Last 1 Encounters:  10/15/12 5\' 4"  (1.626 m)    Weight: Wt Readings from Last 1 Encounters:  10/16/12 219 lb 1.6 oz (99.383 kg)    Ideal Body Weight: 120 lbs  % Ideal Body Weight: 182%  Wt Readings from Last 10 Encounters:  10/16/12 219 lb 1.6 oz (99.383 kg)  10/06/12 223 lb 9.6 oz (101.424 kg)  09/21/12 223 lb 5.2 oz (101.3 kg)  05/28/12 227 lb (102.967 kg)  05/05/12 227 lb 12.8 oz (103.329 kg)  03/03/12 235 lb (106.595 kg)  02/02/12 225 lb  12.8 oz (102.422 kg)  09/09/11 234 lb 6.4 oz (106.323 kg)  08/12/11 243 lb 9.6 oz (110.496 kg)    Usual Body Weight: 225 lbs  % Usual Body Weight: 97%  BMI:  Body mass index is 37.59 kg/(m^2).  Estimated Nutritional Needs: Kcal: 1780-1980 Protein: 85-100g Fluid: >1.8 L/day or per MD discretion  Skin: intact  Diet Order: General  EDUCATION NEEDS: -Education needs addressed   Intake/Output Summary (Last 24 hours) at 10/16/12 0949 Last data filed at 10/16/12 0900  Gross per 24 hour  Intake 1420.17 ml  Output      0 ml  Net 1420.17 ml    Last BM: PTA  Labs:   Recent Labs Lab 10/15/12 1200 10/16/12 0640  NA 131* 132*  K 3.8 4.5  CL 89* 98  CO2 30 25  BUN 13 9  CREATININE 0.83 0.82  CALCIUM 9.9 8.5  GLUCOSE 108* 106*    CBG (last 3)   Recent Labs  10/15/12 2115 10/16/12 0726  GLUCAP 117* 107*    Scheduled Meds: . allopurinol  100 mg Oral Daily  . antiseptic oral rinse  15 mL Mouth Rinse BID  . benzonatate  100 mg Oral TID  . budesonide  0.25 mg Nebulization BID  . ceFEPIme (MAXIPIME) 2 GM IVP  2 g Intravenous Q8H  . chlorpheniramine-HYDROcodone  5 mL Oral Q12H  . docusate sodium  100 mg Oral BID  .  enoxaparin (LOVENOX) injection  40 mg Subcutaneous Q24H  . famotidine  20 mg Oral BID  . fluticasone  1 spray Each Nare Daily  . folic acid  2 mg Oral Daily  . furosemide  40 mg Intravenous BID  . guaiFENesin  600 mg Oral BID  . insulin aspart  0-9 Units Subcutaneous TID WC  . ipratropium  0.5 mg Nebulization Q6H  . levalbuterol  0.63 mg Nebulization Q6H  . levothyroxine  50 mcg Oral QAC breakfast  . loratadine  10 mg Oral QHS  . metronidazole  500 mg Intravenous Q8H  . montelukast  10 mg Oral QHS  . multivitamin with minerals  1 tablet Oral Daily  . nystatin cream   Topical BID  . pantoprazole  40 mg Oral BID  . potassium chloride SA  20 mEq Oral BID  . vancomycin  1,250 mg Intravenous Q12H    Continuous Infusions: . 0.9 % NaCl with KCl 20  mEq / L 1,000 mL (10/15/12 1859)    Past Medical History  Diagnosis Date  . Asthma   . Rheumatoid arthritis   . Emphysema     02 dependent 3L/min  . Emphysema   . Pneumonia   . Hypothyroidism   . Diabetes mellitus, type II   . Obesity     Past Surgical History  Procedure Laterality Date  . Eye surgery  at age 89  . Tubal ligation    . Colonoscopy      Loyce Dys, MS RD LDN Clinical Inpatient Dietitian Pager: [phone] Weekend/After hours pager: [phone]

## 2012-10-17 LAB — BASIC METABOLIC PANEL
GFR calc Af Amer: 86 mL/min — ABNORMAL LOW (ref >90)
GFR calc non Af Amer: 75 mL/min — ABNORMAL LOW (ref >90)
Potassium: 3.5 mEq/L (ref 3.5–5.1)
Sodium: 131 mEq/L — ABNORMAL LOW (ref 135–145)

## 2012-10-17 LAB — CBC
Hemoglobin: 9.9 g/dL — ABNORMAL LOW (ref 12.0–15.0)
MCHC: 33.1 g/dL (ref 30.0–36.0)
Platelets: 284 10*3/uL (ref 150–400)
RBC: 3.35 MIL/uL — ABNORMAL LOW (ref 3.87–5.11)

## 2012-10-17 LAB — GLUCOSE, CAPILLARY
Glucose-Capillary: 101 mg/dL — ABNORMAL HIGH (ref 70–99)
Glucose-Capillary: 114 mg/dL — ABNORMAL HIGH (ref 70–99)

## 2012-10-17 MED ORDER — ALBUMIN HUMAN 25 % IV SOLN
25.0000 g | Freq: Two times a day (BID) | INTRAVENOUS | Status: DC
  Administered 2012-10-17 (×2): 25 g via INTRAVENOUS
  Filled 2012-10-17 (×4): qty 100

## 2012-10-17 MED ORDER — DEXTROSE 50 % IV SOLN
INTRAVENOUS | Status: AC
  Filled 2012-10-17: qty 50

## 2012-10-17 NOTE — Consult Note (Signed)
ANTIBIOTIC CONSULT NOTE  Pharmacy Consult for Cefepime/vancomycin Indication: pneumonia  Hospital Problems Principal Problem:   HCAP (healthcare-associated pneumonia) Active Problems:   Diastolic heart failure   Acute and chronic respiratory failure   COPD, severe   Hypoxemia   Hyponatremia   Hypothyroidism   Diabetes mellitus, type II   Morbid obesity   Post-tussive vomiting   Height: 64 inches Weight: 101.4 kg  Vitals: BP 102/52  Pulse 110  Temp(Src) 98.7 F (37.1 C) (Axillary)  Resp 21  Ht 5\' 4"  (1.626 m)  Wt 218 lb 4.1 oz (99 kg)  BMI 37.45 kg/m2  SpO2 92%  Labs:  Recent Labs  10/15/12 1200 10/16/12 0640 10/17/12 0422  WBC 21.9* 24.1* 23.0*  HGB 11.4* 10.6* 9.9*  PLT 355 315 284  CREATININE 0.83 0.82 0.83   Estimated Creatinine Clearance: 81.4 ml/min (by C-G formula based on Cr of 0.83).   Assessment:  62 y/o female recently admitted to the hospital for PNA.  she was seen by her PCP and was sent to the ED.  In the office she was tachycardic and had an O2 sat 87 %.  She has been placed on Cefepime and vancomycin for treatment of PNA.   Estimated CrCl 80 ml/min.  WBC 23. No fevers noted  Goal of Therapy:  Antibiotics selected for infection/cultures and adjusted for renal or hepatic function.  Vancomycin trough 15-20  Plan:   Cefepime 2 gm IV q 8 hours.  Vancomycin 1250mg  q12 hours  Plan on checking vancomycin level in next 1-2 days Follow up SCr, UOP, cultures, clinical course and adjust as clinically indicated.   Sheppard Coil PharmD., BCPS Clinical Pharmacist Pager [phone] 10/17/2012 1:10 PM

## 2012-10-17 NOTE — Progress Notes (Signed)
[facility] PROGRESS NOTE  KRISSIE MERRICK ZOX:[id number] DOB: April 28, 1951 DOA: 10/15/2012 PCP: Nadean Corwin, MD  Assessment/Plan: 1. Health care associated/aspiration pneumonia. Patient has been started on broad spectrum antibiotics. Discussed with speech therapy and there were no overt signs of aspiration on bedside evaluation.  With her recurrent admissions and worsening chest xray, I think an objective swallow study would be helpful.  Until then, we will continue pepcid and start PPI. Continue pulmonary hygiene. Patient will likely have slow recovery. 2. Acute on chronic resp failure.  Patient does not feel back to baseline yet.  Still having difficulty speaking in full sentences.  Will continue current treatments. 3. Acute on Chronic diastolic CHF. Patient does have clinical evidence of volume overload.  Currently on IV lasix.  Will add albumin infusions to help with diuresis. 4. Type 2 diabetes.  Follow blood sugars. 5. Hyponatremia.  Likely hypervolemic. Will give lasix and observe. 6. Oxygen dependent COPD.  Does not have evidence of wheezing. Continue nebulizer treatments. 7. Leukocytosis, possibly due to pneumonia, was also recently on steroids.  Continue to follow. 8. Severe protein calorie malnutrition. Likely contributing to underlying pedal edema.  Continue to follow. 9. Tachycardia, likely reactive to underlying pulmonary process. TSH mildly low, but free T3 also low. Will repeat Echo.  Last echo was a year ago and EF was normal at that time.   Code Status: full code Family Communication: discussed with patient and husband Disposition Plan: return home on discharge   Consultants:  Speech therapy 5/3  Procedures:  none  Antibiotics:  Cefepime 5/2  Flagyl 5/2  Vancomycin 5/2  HPI/Subjective: Feels worse today.  Continued non productive cough  Objective: Filed Vitals:   10/17/12 0449 10/17/12 0843 10/17/12 0900 10/17/12 0912  BP: 99/51 103/50 116/59    Pulse: 113 117 116   Temp: 98.1 F (36.7 C) 97 F (36.1 C)    TempSrc: Oral Oral    Resp: 23 29 27    Height:      Weight: 99 kg (218 lb 4.1 oz)     SpO2: 92% 93% 96% 94%    Intake/Output Summary (Last 24 hours) at 10/17/12 1112 Last data filed at 10/17/12 1057  Gross per 24 hour  Intake   1520 ml  Output   2250 ml  Net   -730 ml   Filed Weights   10/15/12 1645 10/16/12 0600 10/17/12 0449  Weight: 97.4 kg (214 lb 11.7 oz) 99.383 kg (219 lb 1.6 oz) 99 kg (218 lb 4.1 oz)    Exam:   General:  Short of breath while speaking, no acute distress  Cardiovascular: S1, s2 tachycardic  Respiratory: bilateral crackles  Abdomen: soft, nt, nd, bs+  Musculoskeletal: 2+ edema b/l   Data Reviewed: Basic Metabolic Panel:  Recent Labs Lab 10/15/12 1200 10/16/12 0640 10/17/12 0422  NA 131* 132* 131*  K 3.8 4.5 3.5  CL 89* 98 95*  CO2 30 25 27   GLUCOSE 108* 106* 102*  BUN 13 9 10   CREATININE 0.83 0.82 0.83  CALCIUM 9.9 8.5 8.4   Liver Function Tests:  Recent Labs Lab 10/16/12 0640  AST 39*  ALT 17  ALKPHOS 123*  BILITOT 0.3  PROT 6.4  ALBUMIN 1.5*    Recent Labs Lab 10/15/12 1452  LIPASE 17   No results found for this basename: AMMONIA,  in the last 168 hours CBC:  Recent Labs Lab 10/15/12 1200 10/16/12 0640 10/17/12 0422  WBC 21.9* 24.1* 23.0*  HGB 11.4* 10.6*  9.9*  HCT 34.0* 31.9* 29.9*  MCV 90.2 90.4 89.3  PLT 355 315 284   Cardiac Enzymes: No results found for this basename: CKTOTAL, CKMB, CKMBINDEX, TROPONINI,  in the last 168 hours BNP (last 3 results)  Recent Labs  09/20/12 1316 10/15/12 1200  PROBNP 333.7* 327.5*   CBG:  Recent Labs Lab 10/15/12 2115 10/16/12 0726 10/16/12 1651 10/17/12 0815  GLUCAP 117* 107* 110* 101*    Recent Results (from the past 240 hour(s))  MRSA PCR SCREENING     Status: None   Collection Time    10/15/12  5:12 PM      Result Value Range Status   MRSA by PCR NEGATIVE  NEGATIVE Final   Comment:             The GeneXpert MRSA Assay (FDA     approved for NASAL specimens     only), is one component of a     comprehensive MRSA colonization     surveillance program. It is not     intended to diagnose MRSA     infection nor to guide or     monitor treatment for     MRSA infections.     Studies: Dg Chest 2 View  10/15/2012  *RADIOLOGY REPORT*  Clinical Data: Pneumonia, dehydration  CHEST - 2 VIEW  Comparison: 10/06/2012  Findings: Cardiomediastinal silhouette is stable.  Diffuse bilateral infiltrates are again noted.  Worsening infiltrate/consolidation in the right upper lobe and right perihilar region.  Follow-up to resolution after treatment is recommended.  IMPRESSION: Diffuse bilateral infiltrates again noted.  Worsening infiltrates/consolidation in the right upper lobe and right perihilar region consistent with worsening pneumonia.  Follow-up to resolution after treatment is recommended.   Original Report Authenticated By: Natasha Mead, M.D.     Scheduled Meds: . allopurinol  100 mg Oral Daily  . antiseptic oral rinse  15 mL Mouth Rinse BID  . benzonatate  100 mg Oral TID  . budesonide  0.25 mg Nebulization BID  . ceFEPIme (MAXIPIME) 2 GM IVP  2 g Intravenous Q8H  . chlorpheniramine-HYDROcodone  5 mL Oral Q12H  . docusate sodium  100 mg Oral BID  . enoxaparin (LOVENOX) injection  40 mg Subcutaneous Q24H  . famotidine  20 mg Oral BID  . fluticasone  1 spray Each Nare Daily  . folic acid  2 mg Oral Daily  . furosemide  40 mg Intravenous BID  . guaiFENesin  600 mg Oral BID  . insulin aspart  0-9 Units Subcutaneous TID WC  . ipratropium  0.5 mg Nebulization Q6H  . levalbuterol  0.63 mg Nebulization Q6H  . levothyroxine  50 mcg Oral QAC breakfast  . loratadine  10 mg Oral QHS  . metronidazole  500 mg Intravenous Q8H  . montelukast  10 mg Oral QHS  . multivitamin with minerals  1 tablet Oral Daily  . nystatin cream   Topical BID  . pantoprazole  40 mg Oral BID  . potassium  chloride SA  20 mEq Oral BID  . vancomycin  1,250 mg Intravenous Q12H   Continuous Infusions: . 0.9 % NaCl with KCl 20 mEq / L 20 mL/hr at 10/16/12 1610    Principal Problem:   HCAP (healthcare-associated pneumonia) Active Problems:   Diastolic heart failure   Acute and chronic respiratory failure   COPD, severe   Hypoxemia   Hyponatremia   Hypothyroidism   Diabetes mellitus, type II   Morbid obesity   Post-tussive  vomiting    Time spent:    [facility]  [facility] Pager [phone]. If 7PM-7AM, please contact night-coverage at [email or web address], password [facility] 10/17/2012, 11:12 AM  LOS: 2 days

## 2012-10-18 ENCOUNTER — Inpatient Hospital Stay (HOSPITAL_COMMUNITY): Payer: Medicare Other

## 2012-10-18 DIAGNOSIS — E43 Unspecified severe protein-calorie malnutrition: Secondary | ICD-10-CM | POA: Diagnosis present

## 2012-10-18 DIAGNOSIS — E876 Hypokalemia: Secondary | ICD-10-CM

## 2012-10-18 LAB — GLUCOSE, CAPILLARY
Glucose-Capillary: 115 mg/dL — ABNORMAL HIGH (ref 70–99)
Glucose-Capillary: 141 mg/dL — ABNORMAL HIGH (ref 70–99)
Glucose-Capillary: 100 mg/dL — ABNORMAL HIGH (ref 70–99)
Glucose-Capillary: 109 mg/dL — ABNORMAL HIGH (ref 70–99)

## 2012-10-18 LAB — BASIC METABOLIC PANEL
Chloride: 98 mEq/L (ref 96–112)
Creatinine, Ser: 0.84 mg/dL (ref 0.50–1.10)
GFR calc Af Amer: 85 mL/min — ABNORMAL LOW (ref >90)
GFR calc non Af Amer: 74 mL/min — ABNORMAL LOW (ref >90)
Potassium: 2.7 mEq/L — CL (ref 3.5–5.1)

## 2012-10-18 LAB — CBC
HCT: 27.5 % — ABNORMAL LOW (ref 36.0–46.0)
RDW: 15.6 % — ABNORMAL HIGH (ref 11.5–15.5)
WBC: 19.2 10*3/uL — ABNORMAL HIGH (ref 4.0–10.5)

## 2012-10-18 MED ORDER — LEVALBUTEROL HCL 0.63 MG/3ML IN NEBU
0.6300 mg | INHALATION_SOLUTION | RESPIRATORY_TRACT | Status: DC
  Administered 2012-10-18 – 2012-10-19 (×10): 0.63 mg via RESPIRATORY_TRACT
  Filled 2012-10-18 (×12): qty 3

## 2012-10-18 MED ORDER — FUROSEMIDE 10 MG/ML IJ SOLN
40.0000 mg | Freq: Two times a day (BID) | INTRAMUSCULAR | Status: DC
  Administered 2012-10-18: 40 mg via INTRAVENOUS
  Filled 2012-10-18 (×2): qty 4

## 2012-10-18 MED ORDER — IPRATROPIUM BROMIDE 0.02 % IN SOLN
0.5000 mg | RESPIRATORY_TRACT | Status: DC
  Administered 2012-10-18 – 2012-10-19 (×10): 0.5 mg via RESPIRATORY_TRACT
  Filled 2012-10-18 (×8): qty 2.5

## 2012-10-18 MED ORDER — POTASSIUM CHLORIDE 10 MEQ/50ML IV SOLN
INTRAVENOUS | Status: AC
  Administered 2012-10-18: 10 meq
  Filled 2012-10-18: qty 150

## 2012-10-18 MED ORDER — POTASSIUM CHLORIDE 10 MEQ/100ML IV SOLN
10.0000 meq | INTRAVENOUS | Status: AC
  Administered 2012-10-18: 10 meq via INTRAVENOUS

## 2012-10-18 MED ORDER — POTASSIUM CHLORIDE CRYS ER 20 MEQ PO TBCR
40.0000 meq | EXTENDED_RELEASE_TABLET | Freq: Once | ORAL | Status: AC
  Administered 2012-10-18: 40 meq via ORAL
  Filled 2012-10-18: qty 2

## 2012-10-18 MED ORDER — POTASSIUM CHLORIDE CRYS ER 20 MEQ PO TBCR
40.0000 meq | EXTENDED_RELEASE_TABLET | Freq: Three times a day (TID) | ORAL | Status: DC
  Administered 2012-10-18 – 2012-10-21 (×10): 40 meq via ORAL
  Filled 2012-10-18 (×13): qty 2

## 2012-10-18 MED ORDER — POTASSIUM CHLORIDE CRYS ER 20 MEQ PO TBCR: 40.0000 meq | EXTENDED_RELEASE_TABLET | Freq: Two times a day (BID) | ORAL | Status: DC

## 2012-10-18 NOTE — Progress Notes (Signed)
Subjective: The patient states that she is feeling better compared to when she first came in. However is, she states that she needs a breathing treatment.  Objective: Vital signs in last 24 hours: Filed Vitals:   10/18/12 0600 10/18/12 0756 10/18/12 0946 10/18/12 1127  BP: 102/50 104/53  102/50  Pulse: 104 116  117  Temp:  98 F (36.7 C)  98.3 F (36.8 C)  TempSrc:  Oral  Oral  Resp: 27 34  37  Height:      Weight:      SpO2: 92% 94% 93% 93%    Intake/Output Summary (Last 24 hours) at 10/18/12 1131 Last data filed at 10/18/12 4696  Gross per 24 hour  Intake   1880 ml  Output   1250 ml  Net    630 ml    Weight change: 1 kg (2 lb 3.3 oz)  Physical exam: Obese 62 year old Caucasian woman sitting up in the chair, mildly dyspneic. Lungs: Left greater than right pulmonary crackles. Mildly dyspneic. Heart: S1, S2, with borderline tachycardia.  Abdomen: Obese, positive bowel sounds, soft, nontender, nondistended. Extremities: 1+, nonpitting bilateral extremity edema. Neurologic: She is alert and oriented x3. Cranial nerves II through XII are intact.  Lab Results: Basic Metabolic Panel:  Recent Labs  29/52/84 0422 10/18/12 0413  NA 131* 136  K 3.5 2.7*  CL 95* 98  CO2 27 30  GLUCOSE 102* 91  BUN 10 10  CREATININE 0.83 0.84  CALCIUM 8.4 8.5   Liver Function Tests:  Recent Labs  10/16/12 0640  AST 39*  ALT 17  ALKPHOS 123*  BILITOT 0.3  PROT 6.4  ALBUMIN 1.5*    Recent Labs  10/15/12 1452  LIPASE 17   No results found for this basename: AMMONIA,  in the last 72 hours CBC:  Recent Labs  10/17/12 0422 10/18/12 0413  WBC 23.0* 19.2*  HGB 9.9* 9.2*  HCT 29.9* 27.5*  MCV 89.3 88.7  PLT 284 268   Cardiac Enzymes: No results found for this basename: CKTOTAL, CKMB, CKMBINDEX, TROPONINI,  in the last 72 hours BNP:  Recent Labs  10/15/12 1200  PROBNP 327.5*   D-Dimer: No results found for this basename: DDIMER,  in the last 72  hours CBG:  Recent Labs  10/15/12 2115 10/16/12 0726 10/16/12 1651 10/17/12 0815 10/17/12 1227 10/18/12 0754  GLUCAP 117* 107* 110* 101* 114* 100*   Hemoglobin A1C:  Recent Labs  10/15/12 1818  HGBA1C 5.8*   Fasting Lipid Panel: No results found for this basename: CHOL, HDL, LDLCALC, TRIG, CHOLHDL, LDLDIRECT,  in the last 72 hours Thyroid Function Tests: No results found for this basename: TSH, T4TOTAL, FREET4, T3FREE, THYROIDAB,  in the last 72 hours Anemia Panel: No results found for this basename: VITAMINB12, FOLATE, FERRITIN, TIBC, IRON, RETICCTPCT,  in the last 72 hours Coagulation: No results found for this basename: LABPROT, INR,  in the last 72 hours Urine Drug Screen: Drugs of Abuse  No results found for this basename: labopia, cocainscrnur, labbenz, amphetmu, thcu, labbarb    Alcohol Level: No results found for this basename: ETH,  in the last 72 hours Urinalysis: No results found for this basename: COLORURINE, APPERANCEUR, LABSPEC, PHURINE, GLUCOSEU, HGBUR, BILIRUBINUR, KETONESUR, PROTEINUR, UROBILINOGEN, NITRITE, LEUKOCYTESUR,  in the last 72 hours Misc. Labs:   Micro: Recent Results (from the past 240 hour(s))  MRSA PCR SCREENING     Status: None   Collection Time    10/15/12  5:12 PM  Result Value Range Status   MRSA by PCR NEGATIVE  NEGATIVE Final   Comment:            The GeneXpert MRSA Assay (FDA     approved for NASAL specimens     only), is one component of a     comprehensive MRSA colonization     surveillance program. It is not     intended to diagnose MRSA     infection nor to guide or     monitor treatment for     MRSA infections.    Studies/Results: Dg Chest Port 1 View  10/18/2012  *RADIOLOGY REPORT*  Clinical Data: Pneumonia.  Pulmonary edema.  Cough.  PORTABLE CHEST - 1 VIEW  Comparison: 10/15/2012.  Findings: Compared to the prior exam, the right lung diffuse airspace disease with focal right upper lobe consolidation has  worsened.  This may represent hydration affects of pneumonia. Chronic-appearing interstitial changes of the left chest appears similar to the prior exam. Monitoring leads are projected over the chest. No pneumothorax.  Cardiomediastinal contours appear little changed.  IMPRESSION: Worsening right lung airspace disease with dense consolidation in the right upper lobe.  Findings are most compatible with pneumonia. Worsening density is probably due to hydration affects.   Original Report Authenticated By: Andreas Newport, M.D.     Medications:  Scheduled: . allopurinol  100 mg Oral Daily  . antiseptic oral rinse  15 mL Mouth Rinse BID  . benzonatate  100 mg Oral TID  . budesonide  0.25 mg Nebulization BID  . ceFEPIme (MAXIPIME) 2 GM IVP  2 g Intravenous Q8H  . chlorpheniramine-HYDROcodone  5 mL Oral Q12H  . docusate sodium  100 mg Oral BID  . enoxaparin (LOVENOX) injection  40 mg Subcutaneous Q24H  . famotidine  20 mg Oral BID  . fluticasone  1 spray Each Nare Daily  . folic acid  2 mg Oral Daily  . guaiFENesin  600 mg Oral BID  . insulin aspart  0-9 Units Subcutaneous TID WC  . ipratropium  0.5 mg Nebulization Q4H  . levalbuterol  0.63 mg Nebulization Q4H  . levothyroxine  50 mcg Oral QAC breakfast  . loratadine  10 mg Oral QHS  . metronidazole  500 mg Intravenous Q8H  . montelukast  10 mg Oral QHS  . multivitamin with minerals  1 tablet Oral Daily  . nystatin cream   Topical BID  . pantoprazole  40 mg Oral BID  . potassium chloride SA  40 mEq Oral BID  . vancomycin  1,250 mg Intravenous Q12H   Continuous: . 0.9 % NaCl with KCl 20 mEq / L 20 mL/hr at 10/16/12 4132   GMW:NUUVOZDGUYQIH, acetaminophen, levalbuterol, morphine injection, ondansetron (ZOFRAN) IV, ondansetron, oxyCODONE, polyethylene glycol, sodium chloride  Assessment: Principal Problem:   HCAP (healthcare-associated pneumonia) Active Problems:   Acute and chronic respiratory failure   COPD, severe   Hypoxemia    Diastolic heart failure   Hyponatremia   Hypothyroidism   Diabetes mellitus, type II   Morbid obesity   Post-tussive vomiting   Protein-calorie malnutrition, severe   1. Healthcare associated pneumonia/possible aspiration pneumonia. We'll continue broad spectrum antibiotic therapy with cefepime, vancomycin, and metronidazole. Her initial speech therapist evaluation, there were no overt signs of aspiration, but would prefer MBS to make sure. We'll continue metronidazole for now. If the MBS is negative, we'll discontinue it. Her white blood cell count is coming down slowly. She is afebrile. Chest x-ray today, 10/18/2012 reveals  worsening right lung airspace disease which may be, in part, secondary to the affects of hydration and to pneumonia "fluffing out" on chest x-ray. Of note, urine Legionella antigen and strep pneumo antigen are both negative.  Mild acute on chronic diastolic heart failure. The patient ins and outs are positive. She was started on IV Lasix with albumin infusions. Her chronic lower extremity edema may be more related to hypoalbuminemia and severe decompensated heart failure. We'll continue Lasix, but will discontinue albumin for now.  Severe oxygen-dependent COPD with acute on chronic respiratory failure. Continue oxygen, truncal dilators, and antibiotics as above. She's not having a lot of bronchospasms, but if it appears that she is not improving over the next 24-48 hours, we'll consider adding steroid therapy.   Type 2 diabetes mellitus. Control. Her hemoglobin A1c is 5.8. Next  Hyponatremia. Gentle IV normal saline discontinued. She was started on IV Lasix which has improved her serum sodium.  Hypokalemia. Likely secondary to IV Lasix. We'll continue to replete.  Severe protein calorie malnutrition/hypoalbuminemia. She was given albumin on 1 or 2 occasions, but will discontinue it for now. We'll order nutritional supplements.   Plan:  1. Temporarily held Lasix because  of worsening hypokalemia, but will restart it given the appearance of the chest x-ray and that her ins and outs are positive. We'll hold off on further albumin infusions. 2. Order Ensure supplements. 3. Increase potassium chloride supplementation to 40 mEq 3 times a day. We'll check a daily basic metabolic panel. 4. We'll order a modified barium swallow per speech therapy to rule out silent aspiration. If negative, will discontinue IV metronidazole.    LOS: 3 days   Ayani Ospina 10/18/2012, 11:31 AM

## 2012-10-18 NOTE — Progress Notes (Signed)
Echocardiogram 2D Echocardiogram has been performed.  Jacqueline Washington 10/18/2012, 1:54 PM

## 2012-10-18 NOTE — Clinical Documentation Improvement (Signed)
SEPSIS DOCUMENTATION QUERY  THIS DOCUMENT IS NOT A PERMANENT PART OF THE MEDICAL RECORD  TO RESPOND TO THE THIS QUERY, FOLLOW THE INSTRUCTIONS BELOW:  1. If needed, update documentation for the patient's encounter via the notes activity.  2. Access this query again and click edit on the In [employer].  3. After updating, or not, click F2 to complete all highlighted (required) fields concerning your review. Select "additional documentation in the medical record" OR "no additional documentation provided".  4. Click Sign note button.  5. The deficiency will fall out of your In Basket *Please let us know if you are not able to complete this workflow by phone or e-mail (listed below).  Please update your documentation within the medical record to reflect your response to this query.                                                                                    10/18/12  Dear Dr. Elliot Cousin Marton Redwood,  In a better effort to capture your patient's severity of illness, reflect appropriate length of stay and utilization of resources, a review of the patient medical record has revealed the following indicators.    Based on your clinical judgment, please clarify and document in a progress note and/or discharge summary the clinical condition associated with the following supporting information:  In responding to this query please exercise your independent judgment.  The fact that a query is asked, does not imply that any particular answer is desired or expected.    Noted patient had Resp 29, P 121 and WBC 21.9 on presentation to hospital.  Did patient meet "SIRS" or Sepsis" criteria as a clinical condition on admit ? Thank you    Possible Clinical Conditions?  Septicemia / Sepsis  SIRS  Other Condition   Cannot clinically Determine     Treatment: started on IV Vancomycin, and Cefepime   Reviewed: additional documentation in the medical record  Thank Claud Kelp  [place]  Clinical Documentation Specialist: Pager [phone]/ off [phone]  Health Information Management [place]

## 2012-10-18 NOTE — Progress Notes (Signed)
Speech Language Pathology  Patient Details Name: Jacqueline Washington MRN: [id number] DOB: 06-18-1950 Today's Date: 10/18/2012 Time:  -    Received order for MBS.  Will perform tomorrow a.m.  Thank you  Breck Coons Gram Siedlecki M.Ed [employer] [phone]  10/18/2012

## 2012-10-19 ENCOUNTER — Encounter (HOSPITAL_COMMUNITY): Payer: Self-pay | Admitting: Internal Medicine

## 2012-10-19 ENCOUNTER — Inpatient Hospital Stay (HOSPITAL_COMMUNITY): Payer: Medicare Other

## 2012-10-19 LAB — MAGNESIUM: Magnesium: 1.5 mg/dL (ref 1.5–2.5)

## 2012-10-19 LAB — CBC WITH DIFFERENTIAL/PLATELET
Basophils Relative: 2 % — ABNORMAL HIGH (ref 0–1)
Eosinophils Relative: 1 % (ref 0–5)
Hemoglobin: 10.2 g/dL — ABNORMAL LOW (ref 12.0–15.0)
Lymphocytes Relative: 7 % — ABNORMAL LOW (ref 12–46)
MCH: 29.3 pg (ref 26.0–34.0)
Monocytes Absolute: 3.3 10*3/uL — ABNORMAL HIGH (ref 0.1–1.0)
Monocytes Relative: 15 % — ABNORMAL HIGH (ref 3–12)
Neutrophils Relative %: 75 % (ref 43–77)
Platelets: 265 10*3/uL (ref 150–400)
RBC: 3.48 MIL/uL — ABNORMAL LOW (ref 3.87–5.11)
WBC: 22 10*3/uL — ABNORMAL HIGH (ref 4.0–10.5)

## 2012-10-19 LAB — BASIC METABOLIC PANEL
BUN: 10 mg/dL (ref 6–23)
Calcium: 8.7 mg/dL (ref 8.4–10.5)
Creatinine, Ser: 0.81 mg/dL (ref 0.50–1.10)
GFR calc Af Amer: 89 mL/min — ABNORMAL LOW (ref >90)
GFR calc non Af Amer: 77 mL/min — ABNORMAL LOW (ref >90)
Potassium: 3.1 mEq/L — ABNORMAL LOW (ref 3.5–5.1)

## 2012-10-19 LAB — GLUCOSE, CAPILLARY: Glucose-Capillary: 106 mg/dL — ABNORMAL HIGH (ref 70–99)

## 2012-10-19 MED ORDER — IOHEXOL 300 MG/ML  SOLN: 100.0000 mL | Freq: Once | INTRAMUSCULAR | Status: AC | PRN

## 2012-10-19 MED ORDER — MAGNESIUM SULFATE 50 % IJ SOLN
2.0000 g | Freq: Once | INTRAMUSCULAR | Status: DC
  Administered 2012-10-19: 2 g via INTRAVENOUS

## 2012-10-19 MED ORDER — LEVALBUTEROL HCL 0.63 MG/3ML IN NEBU
0.6300 mg | INHALATION_SOLUTION | Freq: Four times a day (QID) | RESPIRATORY_TRACT | Status: DC
  Administered 2012-10-20 – 2012-10-22 (×10): 0.63 mg via RESPIRATORY_TRACT
  Filled 2012-10-19 (×14): qty 3

## 2012-10-19 MED ORDER — LEVOFLOXACIN IN D5W 750 MG/150ML IV SOLN
750.0000 mg | INTRAVENOUS | Status: DC
  Administered 2012-10-19 – 2012-10-22 (×4): 750 mg via INTRAVENOUS
  Filled 2012-10-19 (×5): qty 150

## 2012-10-19 MED ORDER — HYDROCOD POLST-CHLORPHEN POLST 10-8 MG/5ML PO LQCR
5.0000 mL | Freq: Two times a day (BID) | ORAL | Status: DC | PRN
  Administered 2012-10-22: 5 mL via ORAL
  Filled 2012-10-19 (×2): qty 5

## 2012-10-19 MED ORDER — MAGNESIUM SULFATE 40 MG/ML IJ SOLN
2.0000 g | Freq: Once | INTRAMUSCULAR | Status: AC
  Administered 2012-10-19: 2 g via INTRAVENOUS
  Filled 2012-10-19: qty 50

## 2012-10-19 MED ORDER — IPRATROPIUM BROMIDE 0.02 % IN SOLN
0.5000 mg | Freq: Four times a day (QID) | RESPIRATORY_TRACT | Status: DC
  Administered 2012-10-20 – 2012-10-22 (×10): 0.5 mg via RESPIRATORY_TRACT
  Filled 2012-10-19 (×10): qty 2.5

## 2012-10-19 MED ORDER — FUROSEMIDE 40 MG PO TABS
40.0000 mg | ORAL_TABLET | Freq: Every day | ORAL | Status: DC
  Administered 2012-10-19 – 2012-10-20 (×2): 40 mg via ORAL
  Filled 2012-10-19 (×2): qty 1

## 2012-10-19 MED ORDER — PREDNISONE 10 MG PO TABS
60.0000 mg | ORAL_TABLET | Freq: Every day | ORAL | Status: DC
  Administered 2012-10-19 – 2012-10-22 (×4): 60 mg via ORAL
  Filled 2012-10-19 (×5): qty 1

## 2012-10-19 MED ORDER — ENSURE COMPLETE PO LIQD: 237.0000 mL | Freq: Two times a day (BID) | ORAL | Status: DC

## 2012-10-19 NOTE — Progress Notes (Signed)
Speech Language Pathology  Patient Details Name: Jacqueline Washington MRN: [id number] DOB: [birth year] Today's Date: 10/19/2012 Time:  -     RN paged this SLP to report MD cleared pt. to have MBS which has been scheduled today at 1300  [facility] [employer].Ed [employer] [phone]  10/19/2012

## 2012-10-19 NOTE — Progress Notes (Signed)
[facility]  Patient Status: Active (receiving services up to time of hospitalization)  [facility] is providing the following services: RN and PT  If patient discharges after hours, please call [phone].   Jacqueline Washington 10/19/2012, 4:37 PM

## 2012-10-19 NOTE — Progress Notes (Signed)
Dr Sherrie Mustache called related to pt labs, heart rate of st 120-130's and pt having beats of v-tach between 8-10 beats long. Pt is asymptomatic b/p 106/82 resp 26 o2 sat 95 on 4L.

## 2012-10-19 NOTE — Progress Notes (Signed)
Speech Language Pathology  Patient Details Name: Jacqueline Washington MRN: [id number] DOB: 1951-04-20 Today's Date: 10/19/2012 Time:  -    Scheduled MBS for this a.m., however, RN reported pt. Is currently unstable and will notify if pt. Able to leave floor for MBS.  Breck Coons [place].Ed [employer] [phone]  10/19/2012

## 2012-10-19 NOTE — Progress Notes (Signed)
Replaced O2 humidity bottle.

## 2012-10-19 NOTE — Procedures (Signed)
Objective Swallowing Evaluation: Modified Barium Swallowing Study  Patient Details  Name: Jacqueline Washington MRN: [id number] Date of Birth: 07/17/1950  Today's Date: 10/19/2012 Time: 4098-1191 SLP Time Calculation (min): 11 min  Past Medical History:  Past Medical History  Diagnosis Date  . Asthma   . Rheumatoid arthritis   . Emphysema     02 dependent 3L/min  . Emphysema   . Pneumonia   . Hypothyroidism   . Diabetes mellitus, type II   . Obesity   . Diastolic heart failure 09/22/2012    Grade 1   Past Surgical History:  Past Surgical History  Procedure Laterality Date  . Eye surgery  at age 58  . Tubal ligation    . Colonoscopy     HPI:  62 year old female admitted with HCAP. High suspicion for aspiration PNA per MD notes as children mention she coughs frequently during meals in addition to her chronic appearing cough. PMH of severe O2 dependent COPD. Her chest x-ray reveals diffuse bilateral infiltrates, worsening infiltrate/consolidation in the right upper lobe and right perihilar region consistent with worsening pneumonia.   MBS recommended     Assessment / Plan / Recommendation Clinical Impression  Dysphagia Diagnosis: Mild pharyngeal phase dysphagia Clinical impression: Pt.'s head of bed/chair was raised and she became nauseated with increased SOB.  SLP allowed pt. 5 minutes in which her nausea subsided and respiration rate decreased slightly.  She demonstrated mild pharyngeal dysphagia with laryngeal penetration likely due to increased respiratory rate and decreased coordination of swallow and respiration.  Thin liquids were penetration (flash with straw x 1, flash with cup x1 and minimal remained on anterior wall of vestibule).  Swallow initiation was timely without significant residue (one instance of lingual residue falling to pyriform sinuses post swallow.  Brief esophageal revealed possible min-mild stasis (no radiologist present to confirm).  Recommend regular diet texture  and thin liquids.  SLP stressed the importance of not eating/drinking when she is very SOB, taking small sips and bites and no straws, pills whole in applesauce.  SLP will continue to folllow.    Treatment Recommendation  Therapy as outlined in treatment plan below    Diet Recommendation Regular;Thin liquid   Liquid Administration via: Cup;No straw Medication Administration: Whole meds with puree Supervision: Patient able to self feed;Intermittent supervision to cue for compensatory strategies Compensations: Slow rate;Small sips/bites Postural Changes and/or Swallow Maneuvers: Seated upright 90 degrees;Upright 30-60 min after meal    Other  Recommendations Oral Care Recommendations: Oral care BID   Follow Up Recommendations   (tba)    Frequency and Duration min 2x/week  1 week   Pertinent Vitals/Pain     SLP Swallow Goals Patient will utilize recommended strategies during swallow to increase swallowing safety with: Supervision/safety      Reason for Referral Objectively evaluate swallowing function   Oral Phase Oral Preparation/Oral Phase Oral Phase: WFL   Pharyngeal Phase Pharyngeal Phase Pharyngeal Phase: Impaired Pharyngeal - Thin Pharyngeal - Thin Cup: Penetration/Aspiration during swallow Penetration/Aspiration details (thin cup): Material enters airway, remains ABOVE vocal cords then ejected out;Material enters airway, remains ABOVE vocal cords and not ejected out Pharyngeal - Thin Straw: Penetration/Aspiration during swallow;Pharyngeal residue - valleculae (mild lingual residue spill into vallecuale) Penetration/Aspiration details (thin straw): Material enters airway, remains ABOVE vocal cords then ejected out Pharyngeal - Solids Pharyngeal - Regular: Within functional limits  Cervical Esophageal Phase        Cervical Esophageal Phase Cervical Esophageal Phase: [facility]  Breck Coons [place].Ed [employer] [phone]  10/19/2012

## 2012-10-19 NOTE — Progress Notes (Addendum)
2300-Pt assisted to [facility] and requested to sit for a short amount of time. Daughter at bedside. Daughter calls out to staff for help. Upon arrival to room, Pt is sitting on floor by [facility]. Without calling staff for assistance first, pt went to  perform peri care when she felt her knees give out. She started to lean back and daughter held her and guided her to the floor. Helped get Pt back to bed. Pt A/Ox4, VSS, no s/s of pain. Educated daughter and patient on importance of calling nurse/nurse tech to room before assisted pt with BSC. Craige Cotta, NP notified.   0030-RN at bedside assisting daughter to get pt to [facility]. Pt very weak, knees buckled and pt landed on bed. Pt recently got dose of lasix and has urinary frequency. Pt refusing to use bedpan and insists on getting up to [facility]  Pt very weak and jittery. Craige Cotta, NP notified and order for foley catheter received. Pt/daughter consent to catheter at this time.   Craige Cotta, NP also notified of family's concerns with pt status (weakness, shakes, no appetite.) NP will leave a note for MD in AM to speak with daughter, Doreatha Martin.  Pt comfortable and resting in bed at this time. Will continue to monitor.  M.Foster Simpson, RN

## 2012-10-19 NOTE — Progress Notes (Addendum)
Subjective: The patient had a non-traumatic fall overnight. She says that she is breathing about the same, but better than when she first came in. Her family is in the room and is concerned about her pneumonia. Questions answered.  Objective: Vital signs in last 24 hours: Filed Vitals:   10/19/12 1213 10/19/12 1455 10/19/12 1531 10/19/12 1600  BP: 130/51 102/55    Pulse: 119 108    Temp: 98 F (36.7 C) 98 F (36.7 C)  98.6 F (37 C)  TempSrc: Oral Oral  Oral  Resp: 24 26    Height:      Weight:      SpO2: 93% 94% 95%     Intake/Output Summary (Last 24 hours) at 10/19/12 1720 Last data filed at 10/19/12 1212  Gross per 24 hour  Intake    580 ml  Output   1550 ml  Net   -970 ml    Weight change: 0 kg (0 lb)  Physical exam: Obese 62 year old Caucasian woman sitting up in the chair, not quite is dyspneic at rest. Lungs: Right greater than left pulmonary crackles and occasional wheezing. Mildly dyspneic. Heart: S1, S2, with borderline tachycardia.  Abdomen: Obese, positive bowel sounds, soft, nontender, nondistended. Extremities: Trace to 1+, nonpitting bilateral extremity edema. Neurologic: She is alert and oriented x3. Cranial nerves II through XII are intact.  Lab Results: Basic Metabolic Panel:  Recent Labs  16/10/96 0413 10/19/12 0500  NA 136 132*  K 2.7* 3.1*  CL 98 96  CO2 30 24  GLUCOSE 91 85  BUN 10 10  CREATININE 0.84 0.81  CALCIUM 8.5 8.7  MG  --  1.5   Liver Function Tests: No results found for this basename: AST, ALT, ALKPHOS, BILITOT, PROT, ALBUMIN,  in the last 72 hours No results found for this basename: LIPASE, AMYLASE,  in the last 72 hours No results found for this basename: AMMONIA,  in the last 72 hours CBC:  Recent Labs  10/18/12 0413 10/19/12 0744  WBC 19.2* 22.0*  NEUTROABS  --  16.6*  HGB 9.2* 10.2*  HCT 27.5* 31.0*  MCV 88.7 89.1  PLT 268 265   Cardiac Enzymes: No results found for this basename: CKTOTAL, CKMB, CKMBINDEX,  TROPONINI,  in the last 72 hours BNP: No results found for this basename: PROBNP,  in the last 72 hours D-Dimer: No results found for this basename: DDIMER,  in the last 72 hours CBG:  Recent Labs  10/17/12 1227 10/17/12 1655 10/18/12 0754 10/18/12 1125 10/18/12 2348 10/19/12 0715  GLUCAP 114* 141* 100* 109* 115* 106*   Hemoglobin A1C: No results found for this basename: HGBA1C,  in the last 72 hours Fasting Lipid Panel: No results found for this basename: CHOL, HDL, LDLCALC, TRIG, CHOLHDL, LDLDIRECT,  in the last 72 hours Thyroid Function Tests: No results found for this basename: TSH, T4TOTAL, FREET4, T3FREE, THYROIDAB,  in the last 72 hours Anemia Panel: No results found for this basename: VITAMINB12, FOLATE, FERRITIN, TIBC, IRON, RETICCTPCT,  in the last 72 hours Coagulation: No results found for this basename: LABPROT, INR,  in the last 72 hours Urine Drug Screen: Drugs of Abuse  No results found for this basename: labopia,  cocainscrnur,  labbenz,  amphetmu,  thcu,  labbarb    Alcohol Level: No results found for this basename: ETH,  in the last 72 hours Urinalysis: No results found for this basename: COLORURINE, APPERANCEUR, LABSPEC, PHURINE, GLUCOSEU, HGBUR, BILIRUBINUR, KETONESUR, PROTEINUR, UROBILINOGEN, NITRITE, LEUKOCYTESUR,  in  the last 72 hours Misc. Labs:   Micro: Recent Results (from the past 240 hour(s))  MRSA PCR SCREENING     Status: None   Collection Time    10/15/12  5:12 PM      Result Value Range Status   MRSA by PCR NEGATIVE  NEGATIVE Final   Comment:            The GeneXpert MRSA Assay (FDA     approved for NASAL specimens     only), is one component of a     comprehensive MRSA colonization     surveillance program. It is not     intended to diagnose MRSA     infection nor to guide or     monitor treatment for     MRSA infections.    Studies/Results: Ct Chest W Contrast  10/19/2012  *RADIOLOGY REPORT*  Clinical Data: Worsening  pneumonia.  CT CHEST WITH CONTRAST  Technique:  Multidetector CT imaging of the chest was performed following the standard protocol during bolus administration of intravenous contrast.  Contrast:  100 ml Omnipaque-300  Comparison: CT scan 06/27/2011.  Findings: The chest wall is unremarkable.  No breast mass, supraclavicular or axillary lymphadenopathy.  The bony thorax is intact.  No destructive bone lesions or spinal canal compromise.  The heart is normal in size.  No pericardial effusion.  There are scattered borderline mediastinal and right hilar lymph nodes which are likely inflammatory/hyperplastic.  The aorta is normal in caliber.  No dissection.  The esophagus is grossly normal. Residual contrast noted in the esophagus.  There is dense airspace consolidation in the right upper lobe with underlying emphysematous changes.  Findings most consistent with lumbar pneumonia.  No obvious mass.  Air bronchograms noted throughout.  No definite abscess.  There is also infiltrate in the right middle lobe and patchy infiltrate in the right lower lobe. Significant underlying emphysema and areas of pulmonary fibrosis. The very small right pleural effusion is noted.  The upper abdomen is grossly normal.  IMPRESSION:  1.  Extensive right lung pneumonia mainly involving the right upper lobe and right middle lobe. 2.  Reactive/hyperplastic mediastinal and hilar lymph nodes. 3.  Small right pleural effusion.   Original Report Authenticated By: Rudie Meyer, M.D.    Dg Chest Port 1 View  10/18/2012  *RADIOLOGY REPORT*  Clinical Data: Pneumonia.  Pulmonary edema.  Cough.  PORTABLE CHEST - 1 VIEW  Comparison: 10/15/2012.  Findings: Compared to the prior exam, the right lung diffuse airspace disease with focal right upper lobe consolidation has worsened.  This may represent hydration affects of pneumonia. Chronic-appearing interstitial changes of the left chest appears similar to the prior exam. Monitoring leads are projected  over the chest. No pneumothorax.  Cardiomediastinal contours appear little changed.  IMPRESSION: Worsening right lung airspace disease with dense consolidation in the right upper lobe.  Findings are most compatible with pneumonia. Worsening density is probably due to hydration affects.   Original Report Authenticated By: Andreas Newport, M.D.    Dg Swallowing Func-speech Pathology  10/19/2012  Breck Coons Delia, CCC-SLP     10/19/2012  3:08 PM Objective Swallowing Evaluation: Modified Barium Swallowing Study   Patient Details  Name: Jacqueline Washington MRN: [id number] Date of Birth: 08-05-1950  Today's Date: 10/19/2012 Time: 1329-1340 SLP Time Calculation (min): 11 min  Past Medical History:  Past Medical History  Diagnosis Date  . Asthma   . Rheumatoid arthritis   . Emphysema  02 dependent 3L/min  . Emphysema   . Pneumonia   . Hypothyroidism   . Diabetes mellitus, type II   . Obesity   . Diastolic heart failure 09/22/2012    Grade 1   Past Surgical History:  Past Surgical History  Procedure Laterality Date  . Eye surgery  at age 57  . Tubal ligation    . Colonoscopy     HPI:  62 year old female admitted with HCAP. High suspicion for  aspiration PNA per MD notes as children mention she coughs  frequently during meals in addition to her chronic appearing  cough. PMH of severe O2 dependent COPD. Her chest x-ray reveals  diffuse bilateral infiltrates, worsening infiltrate/consolidation  in the right upper lobe and right perihilar region consistent  with worsening pneumonia.   MBS recommended     Assessment / Plan / Recommendation Clinical Impression  Dysphagia Diagnosis: Mild pharyngeal phase dysphagia Clinical impression: Pt.'s head of bed/chair was raised and she  became nauseated with increased SOB.  SLP allowed pt. 5 minutes  in which her nausea subsided and respiration rate decreased  slightly.  She demonstrated mild pharyngeal dysphagia with  laryngeal penetration likely due to increased respiratory rate  and decreased  coordination of swallow and respiration.  Thin  liquids were penetration (flash with straw x 1, flash with cup x1  and minimal remained on anterior wall of vestibule).  Swallow  initiation was timely without significant residue (one instance  of lingual residue falling to pyriform sinuses post swallow.   Brief esophageal revealed possible min-mild stasis (no  radiologist present to confirm).  Recommend regular diet texture  and thin liquids.  SLP stressed the importance of not  eating/drinking when she is very SOB, taking small sips and bites  and no straws, pills whole in applesauce.  SLP will continue to  folllow.    Treatment Recommendation  Therapy as outlined in treatment plan below    Diet Recommendation Regular;Thin liquid   Liquid Administration via: Cup;No straw Medication Administration: Whole meds with puree Supervision: Patient able to self feed;Intermittent supervision  to cue for compensatory strategies Compensations: Slow rate;Small sips/bites Postural Changes and/or Swallow Maneuvers: Seated upright 90  degrees;Upright 30-60 min after meal    Other  Recommendations Oral Care Recommendations: Oral care BID   Follow Up Recommendations   (tba)    Frequency and Duration min 2x/week  1 week   Pertinent Vitals/Pain     SLP Swallow Goals Patient will utilize recommended strategies during swallow to  increase swallowing safety with: Supervision/safety      Reason for Referral Objectively evaluate swallowing function   Oral Phase Oral Preparation/Oral Phase Oral Phase: WFL   Pharyngeal Phase Pharyngeal Phase Pharyngeal Phase: Impaired Pharyngeal - Thin Pharyngeal - Thin Cup: Penetration/Aspiration during swallow Penetration/Aspiration details (thin cup): Material enters  airway, remains ABOVE vocal cords then ejected out;Material  enters airway, remains ABOVE vocal cords and not ejected out Pharyngeal - Thin Straw: Penetration/Aspiration during  swallow;Pharyngeal residue - valleculae (mild lingual residue   spill into vallecuale) Penetration/Aspiration details (thin straw): Material enters  airway, remains ABOVE vocal cords then ejected out Pharyngeal - Solids Pharyngeal - Regular: Within functional limits  Cervical Esophageal Phase        Cervical Esophageal Phase Cervical Esophageal Phase: [facility]         Darrow Bussing.Ed CCC-SLP Pager [phone]  10/19/2012     ECHO 10/18/2012:Study Conclusions  - Left ventricle: The cavity size was  normal. Systolic function was normal. The estimated ejection fraction was in the range of 60% to 65%. Although no diagnostic regional wall motion abnormality was identified, this possibility cannot be completely excluded on the basis of this study. Doppler parameters are consistent with abnormal left ventricular relaxation (grade 1 diastolic dysfunction). - Pulmonary arteries: Systolic pressure was mildly increased. PA peak pressure: 43mm Hg (S). Impressions:  - Compared to the prior study, there has been no significant interval change. Transthoracic echocardiography. M-mode, complete 2D, spectral Doppler, and color Doppler. Height: Height: 162.6cm. Height: 64in. Weight: Weight: 100kg. Weight: 220lb. Body mass index: BMI: 37.8kg/m^2. Body surface area: BSA: 2.82m^2. Blood pressure: 102/50. Patient status: Inpatient. Location: ICU/CCU      Medications:  Scheduled: . allopurinol  100 mg Oral Daily  . antiseptic oral rinse  15 mL Mouth Rinse BID  . benzonatate  100 mg Oral TID  . budesonide  0.25 mg Nebulization BID  . ceFEPIme (MAXIPIME) 2 GM IVP  2 g Intravenous Q8H  . docusate sodium  100 mg Oral BID  . enoxaparin (LOVENOX) injection  40 mg Subcutaneous Q24H  . famotidine  20 mg Oral BID  . fluticasone  1 spray Each Nare Daily  . folic acid  2 mg Oral Daily  . furosemide  40 mg Oral Daily  . guaiFENesin  600 mg Oral BID  . insulin aspart  0-9 Units Subcutaneous TID WC  . ipratropium  0.5 mg Nebulization Q4H  . levalbuterol  0.63 mg Nebulization  Q4H  . levothyroxine  50 mcg Oral QAC breakfast  . loratadine  10 mg Oral QHS  . metronidazole  500 mg Intravenous Q8H  . montelukast  10 mg Oral QHS  . multivitamin with minerals  1 tablet Oral Daily  . nystatin cream   Topical BID  . pantoprazole  40 mg Oral BID  . potassium chloride SA  40 mEq Oral TID  . predniSONE  60 mg Oral Q breakfast   Continuous: . 0.9 % NaCl with KCl 20 mEq / L 10 mL/hr (10/18/12 2258)   ZOX:[id number], acetaminophen, chlorpheniramine-HYDROcodone, iohexol, levalbuterol, morphine injection, ondansetron (ZOFRAN) IV, ondansetron, oxyCODONE, polyethylene glycol, sodium chloride  Assessment: Principal Problem:   HCAP (healthcare-associated pneumonia) Active Problems:   Acute and chronic respiratory failure   COPD, severe   Hypoxemia   Diastolic heart failure   Hyponatremia   Hypothyroidism   Diabetes mellitus, type II   Morbid obesity   Post-tussive vomiting   Protein-calorie malnutrition, severe   1. Healthcare associated pneumonia/possible aspiration pneumonia with sepsis. She is on antibiotic day number #4 with vancomycin, cefepime, and metronidazole. Vancomycin discontinued this morning secondary to lack of evidence of staph for MRSA. Speech therapy evaluation/MBS not indicative of silent aspiration. We'll therefore discontinue metronidazole, continue cefepime, and add a quinolone empirically. CT of her chest with contrast today reveals extensive right lung pneumonia. She has been afebrile. Her white blood cell count did increase a little. Of note, urine Legionella antigen and strep pneumo antigen are both negative.  Mild acute on chronic diastolic heart failure. The patient ins and outs are less positive status post IV Lasix.Marland Kitchen She was started on IV Lasix with albumin infusions. Albumin infusions have been discontinued. Her chronic lower extremity edema may be more related to hypoalbuminemia and severe decompensated heart failure. We'll continue Lasix,  but decrease the dose to 40 mg daily.  Severe oxygen-dependent COPD with acute on chronic respiratory failure. She has a few more wheezes than yesterday, therefore  prednisone was started this morning. Continue bronchodilators. Continue oxygen and antibiotics as above.   Type 2 diabetes mellitus. Control. Her hemoglobin A1c is 5.8. Stable and controlled. Inflammation need to be adjusted with the start of prednisone.  Hyponatremia. Gentle IV normal saline discontinued. With Lasix, her serum sodium improved, but is slightly lower than yesterday today. She is receiving fluids with her antibiotics and orally only. Continue to monitor and adjust.  Hypokalemia. Likely secondary to IV Lasix. We'll continue to replete. Her magnesium level is borderline low. She was given 2 g of magnesium sulfate empirically.  Reported short run of V. tach/sinus tachycardia. Her followup EKG revealed sinus tachycardia with no concerning findings. Her 2-D echocardiogram revealed preserved LV function and grade 1 diastolic dysfunction. Her free T4 was within normal limits and able to thousand 14.  Severe protein calorie malnutrition/hypoalbuminemia. She was given albumin infusion x1. It was discontinued. We'll order nutritional supplements.   Plan:  1. Lasix change to by mouth earlier. 2. Prednisone at 60 mg daily started this morning. Slowly taper over the next few days. 3. Status post 2 g of magnesium sulfate. Continue potassium chloride supplementation. Daily basic metabolic panel. Modified potassium chloride supplement when necessary. 4. Discontinue metronidazole. Vancomycin discontinued this morning. Continue cefepime and add Levaquin. 5. PT consultation tomorrow.    LOS: 4 days   Lessly Stigler 10/19/2012, 5:20 PM

## 2012-10-20 ENCOUNTER — Ambulatory Visit: Payer: Medicare Other | Admitting: Adult Health

## 2012-10-20 DIAGNOSIS — R630 Anorexia: Secondary | ICD-10-CM | POA: Diagnosis present

## 2012-10-20 LAB — BASIC METABOLIC PANEL
BUN: 16 mg/dL (ref 6–23)
Calcium: 8.5 mg/dL (ref 8.4–10.5)
Chloride: 100 mEq/L (ref 96–112)
Creatinine, Ser: 0.86 mg/dL (ref 0.50–1.10)
GFR calc Af Amer: 83 mL/min — ABNORMAL LOW (ref >90)

## 2012-10-20 LAB — GLUCOSE, CAPILLARY
Glucose-Capillary: 141 mg/dL — ABNORMAL HIGH (ref 70–99)
Glucose-Capillary: 107 mg/dL — ABNORMAL HIGH (ref 70–99)

## 2012-10-20 LAB — CBC
HCT: 30 % — ABNORMAL LOW (ref 36.0–46.0)
MCHC: 33.7 g/dL (ref 30.0–36.0)
Platelets: 273 10*3/uL (ref 150–400)
RDW: 16.1 % — ABNORMAL HIGH (ref 11.5–15.5)
WBC: 22.8 10*3/uL — ABNORMAL HIGH (ref 4.0–10.5)

## 2012-10-20 MED ORDER — SODIUM CHLORIDE 0.9 % IV SOLN
INTRAVENOUS | Status: DC
  Administered 2012-10-20 – 2012-10-23 (×3): via INTRAVENOUS

## 2012-10-20 MED ORDER — BOOST / RESOURCE BREEZE PO LIQD
1.0000 | Freq: Three times a day (TID) | ORAL | Status: DC
  Administered 2012-10-20 – 2012-10-26 (×16): 1 via ORAL

## 2012-10-20 MED ORDER — SALINE SPRAY 0.65 % NA SOLN: 1.0000 | NASAL | Status: DC | PRN

## 2012-10-20 MED ORDER — METHOTREXATE 2.5 MG PO TABS
7.5000 mg | ORAL_TABLET | ORAL | Status: DC
  Administered 2012-10-21: 7.5 mg via ORAL
  Filled 2012-10-20: qty 3

## 2012-10-20 NOTE — Consult Note (Signed)
ANTIBIOTIC CONSULT NOTE  Pharmacy Consult for Cefepime/vancomycin Indication: pneumonia  Hospital Problems Active Problems:   Chronic diastolic heart failure, NYHA class 1   HCAP (healthcare-associated pneumonia)   Acute and chronic respiratory failure with hypoxia   COPD, severe   Hyponatremia   Hypothyroidism   Diabetes mellitus, type II   Morbid obesity   Protein-calorie malnutrition, severe   Sepsis   Dehydration   Anorexia   Height: 64 inches Weight: 101.4 kg  Vitals: BP 130/70  Pulse 106  Temp(Src) 98.2 F (36.8 C) (Oral)  Resp 28  Ht 5\' 4"  (1.626 m)  Wt 214 lb 15.2 oz (97.5 kg)  BMI 36.88 kg/m2  SpO2 93%  Labs:  Recent Labs  10/18/12 0413 10/19/12 0500 10/19/12 0744 10/20/12 0500  WBC 19.2*  --  22.0* 22.8*  HGB 9.2*  --  10.2* 10.1*  PLT 268  --  265 273  CREATININE 0.84 0.81  --  0.86   Estimated Creatinine Clearance: 77.9 ml/min (by C-G formula based on Cr of 0.86).   Assessment:  62 y/o female recently admitted to the hospital for PNA.  she was seen by her PCP and was sent to the ED.  In the office she was tachycardic and had an O2 sat 87 %. 5/6 Cxr RUL/RML pna  She had been placed on Cefepime, Flagyl and Vancomycin for treatment of PNA- will cover HCAP and aspiration.  Vancomycin and Flagyl was stopped 5/6,   Levofloxacin started 5/6  Estimated CrCl 70 ml/min - antibiotic doses ok for renal function.  WBC 22 on prednisone. No fevers noted   Plan:   Cefepime 2 gm IV q 8 hours.  Levofloxacin 750mg  q24  Plan on checking vancomycin level in next 1-2 days Follow up SCr, UOP, cultures, clinical course and adjust as clinically indicated.   Leota Sauers Pharm.D. CPP, BCPS Clinical Pharmacist [phone] 10/20/2012 1:16 PM

## 2012-10-20 NOTE — Evaluation (Signed)
Physical Therapy Evaluation Patient Details Name: Jacqueline Washington MRN: [id number] DOB: 1951-02-07 Today's Date: 10/20/2012 Time: 4098-1191 PT Time Calculation (min): 31 min  PT Assessment / Plan / Recommendation Clinical Impression  Pt is a 62 yo female with COPD and healthcare associated pneumonia. Pt is extremely deconditioned evidenced by SpO2 82-88 throughout treatment session with static sitting, standing and ambulating. Pt reports SOB throughout session with requests for rest breaks. Pt would benefit from acute PT to address deconditioning, activity tolerance, and safe transfers. Pt requires 24/7 assistance for safe return home and HHPT to address deconditioning, activity tolerance and transfer safety.    PT Assessment  Patient needs continued PT services    Follow Up Recommendations  Home health PT;Supervision/Assistance - 24 hour    Does the patient have the potential to tolerate intense rehabilitation      Barriers to Discharge        Equipment Recommendations  Rolling walker with 5" wheels    Recommendations for Other Services     Frequency Min 3X/week    Precautions / Restrictions Precautions Precautions: Fall (Pt w/ 2 falls when transfering with daughter in hospital)   Pertinent Vitals/Pain Orthostatic Hypotension assessment:  Supine 144/69 Sitting 142/85 Standing 156/88       Mobility  Bed Mobility Bed Mobility: Supine to Sit Supine to Sit: With rails;3: Mod assist;HOB flat Transfers Transfers: Sit to Stand;Stand to Sit Sit to Stand: 4: Min assist;With upper extremity assist;From bed (difficulty following commands for hand placement, SpO2 84%) Stand to Sit: 4: Min assist;To chair/3-in-1;With armrests (pt w/ SOB needing to sit after ambulating 20 ft SpO2 82%) Details for Transfer Assistance: Pt requires v/c's for hand placement and safe transfer technique. Pt with difficulty following commands for hand placement and breathing  techniques. Ambulation/Gait Ambulation/Gait Assistance: 4: Min assist Ambulation Distance (Feet): 20 Feet (2 standing rest breaks) Assistive device: Rolling walker Gait Pattern: Step-through pattern;Trunk flexed;Narrow base of support;Decreased stride length Gait velocity: slow General Gait Details: Pt requires v/c for turning and initiates 2 standing rest breaks. SpO2 82% following ambulation. Pt unable to take standing rest break, needed to sit. Pt reports SpO2 of 84% is normal and she feels only slightly more short of breath than she did PTA. Pt rapidly mouth breathing and instructed on breathing in through her nose and out through her mouth. Pt non-compliant with multiple reminders.     Exercises     PT Diagnosis: Generalized weakness;Difficulty walking  PT Problem List: Decreased strength;Decreased activity tolerance;Decreased balance;Decreased knowledge of use of DME;Decreased safety awareness;Cardiopulmonary status limiting activity PT Treatment Interventions: DME instruction;Gait training;Stair training;Therapeutic activities;Therapeutic exercise   PT Goals Acute Rehab PT Goals PT Goal Formulation: With patient Time For Goal Achievement: 11/03/12 Potential to Achieve Goals: Good Pt will go Supine/Side to Sit: with modified independence;with HOB 0 degrees PT Goal: Supine/Side to Sit - Progress: Goal set today Pt will Transfer Bed to Chair/Chair to Bed: with supervision PT Transfer Goal: Bed to Chair/Chair to Bed - Progress: Goal set today Pt will Stand: with modified independence;with bilateral upper extremity support;3 - 5 min PT Goal: Stand - Progress: Goal set today Pt will Ambulate: 16 - 50 feet;with least restrictive assistive device;with supervision PT Goal: Ambulate - Progress: Goal set today  Visit Information  Last PT Received On: 10/20/12 Assistance Needed: +1    Subjective Data  Subjective: Pt recieved supine in bed with family present. Agreeable to PT   Prior  Functioning  Home Living Lives With: Significant  other Available Help at Discharge: Family;Available 24 hours/day Type of Home: House Home Access: Stairs to enter [employer] of Steps: 3-4 Entrance Stairs-Rails: None Home Layout: One level Bathroom Shower/Tub: Engineer, manufacturing systems: Standard Bathroom Accessibility: No Home Adaptive Equipment: Grab bars in shower;Hand-held shower hose;Tub transfer bench Prior Function Level of Independence: Needs assistance Needs Assistance: Bathing;Meal Prep;Light Housekeeping Bath: Moderate Meal Prep: Total Light Housekeeping: Total Comments: Husband assists pt with bathing and does all cleaning and cooking for the couple. Pt dresses herself Communication Communication: No difficulties    Cognition  Cognition Arousal/Alertness: Lethargic Behavior During Therapy: WFL for tasks assessed/performed Overall Cognitive Status: Within Functional Limits for tasks assessed    Extremity/Trunk Assessment Right Upper Extremity Assessment RUE ROM/Strength/Tone: WFL for tasks assessed Left Upper Extremity Assessment LUE ROM/Strength/Tone: WFL for tasks assessed Right Lower Extremity Assessment RLE ROM/Strength/Tone: WFL for tasks assessed Left Lower Extremity Assessment LLE ROM/Strength/Tone: WFL for tasks assessed   Balance Balance Balance Assessed: Yes Static Sitting Balance Static Sitting - Balance Support: No upper extremity supported;Feet supported Static Sitting - Level of Assistance: 5: Stand by assistance (SBA for eval, trunk flexed) Static Sitting - Comment/# of Minutes: 2  End of Session PT - End of Session Equipment Utilized During Treatment: Gait belt Activity Tolerance: Patient limited by fatigue (Pt continually reporting SOB ) Patient left: in chair;with call bell/phone within reach;with family/visitor present Nurse Communication:  (vitals)  GP    10/20/2012, 2:36 PM Marvis Moeller, Student Physical  Therapist Office #: [phone]

## 2012-10-20 NOTE — Progress Notes (Addendum)
[facility] Progress Note [place] TEAM 1 - Stepdown/ICU TEAM   Jacqueline Washington:[id number] DOB: September 30, 1950 DOA: 10/15/2012 PCP: Nadean Corwin, MD  Brief narrative: 62 year old female patient with O2-dependent COPD, rheumatoid arthritis and diabetes. Presented to the emergency department with reports of shortness of breath and coughing. Recently hospitalized for 3 days in early April for COPD exacerbation. She was discharged on doxycycline and a prednisone taper which was completed. She returned to the pulmonology office on 10/06/2012 and at that time her usual Advair was discontinued because she was unable to obtain because of her insurance coverage. She was transitioned to budesonide nebulizer twice a day along with Proventil nebulizer. Lasix was increased to 40 mg daily for chronic lower extremity edema and chronic diastolic heart failure. On the date of this admission she returned her primary care physician office because of progressive shortness of breath and worsening nonproductive cough with right-sided pleurisy, chills and posttussive vomiting. In the physician's office her oxygen saturation was 80% on 3 L of oxygen so she was instructed to report to the emergency department. In the emergency department she was tachycardic with a heart rate in the 120s but was normotensive. On 3 L of nasal cannula oxygen she was saturating 89%. EKG was unremarkable except for sinus tachycardia. Chest x-ray revealed diffuse bilateral infiltrates with a worsening infiltrate/consolidation in the right upper lobe and right perihilar region consistent with a worsening pneumonia. She had leukocytosis of 22,000. Upon evaluation of the patient admitting physician ordered an ABG was demonstrated a PO2 of 70 on 3 L of oxygen.  Assessment/Plan:   Acute and chronic respiratory failure with hypoxia due to:   A) HCAP (healthcare-associated pneumonia)   B) COPD, severe   C) mild pulmonary HTN -CT chest  revealed dense right side PNA with changes c/w severe COPD-plain CXR also with same dense PNA -cont current anbx's -supportive care with pulmonary toilet/IS and nebs -cont Prednisone/taper slowly -wean O2 as tolerated (baseline O2 3 L) -mobiilze (see below) -WBC not worsening and influenced by steroids/remains afebrile    Sepsis -hemodynamically stable -as above- pulmonary source    Chronic diastolic heart failure, NYHA class 1 -seems compensated -? volume overload esp with prior I>O and was challenged with Lasix but later suspected edema due to low albumin state (see below)    Hypothyroidism -cont Synthroid -TSH low at 0.130 but due to sick euthyroid syndrome since T3/T4 normal    Diabetes mellitus, type II -controlled -cont SSI    Dehydration/Anorexia -very poor PO intake and had fall yesterday which could have been due to orthostasis -will check OVS and dc Lasix -start IVF at 50/hr- if orthostatic can increase rate -allow food from home -change Ensure to Resource (pt request)    Hyponatremia -suspect due to dehydration -follow lytes    Morbid obesity    Protein-calorie malnutrition, severe -nutrition consult   Rheumatoid arthritis -weekly Methotrexate at home- next dose due 5/8   DVT prophylaxis: Lovenox Code Status: Full Family Communication: Patient and family at bedside Disposition Plan: Remain in step down Isolation: None  Consultants: None  Procedures: 2-D echocardiogram: Preserved LV function EF 60-65%, grade 1 diastolic dysfunction, mild pulmonary hypertension 43 mmHg  Antibiotics: Cefepime 5/2 >> Levaquin 5/6 >> Flagyl 5/2 >>> 5/5 Vancomycin 5/2 >> 5/5  HPI/Subjective: Patient alert and although is breathing better and sleeping better than previous still is quite weak and endorses significant anorexia. Complaining of soreness in the abdomen.   Objective: Blood pressure 130/70, pulse  106, temperature 98.2 F (36.8 C), temperature source  Oral, resp. rate 28, height 5\' 4"  (1.626 m), weight 97.5 kg (214 lb 15.2 oz), SpO2 93.00%.  Intake/Output Summary (Last 24 hours) at 10/20/12 1240 Last data filed at 10/20/12 1158  Gross per 24 hour  Intake    255 ml  Output   2225 ml  Net  -1970 ml     Exam: General: No acute respiratory distress Lungs: Coarse to auscultation bilaterally with scattered expiratory crackles bilaterally but also quite diminished right side, 4 L Cardiovascular: Regular rate, occasionally tachycardic without murmur gallop or rub normal S1 and S2, trace peripheral edema Abdomen: Diffuse tender without guarding or rebounding consistent with musculoskeletal etiology after coughing, nondistended, soft, bowel sounds positive, no rebound, no ascites, no appreciable mass Musculoskeletal: No significant cyanosis, clubbing of bilateral lower extremities Neurological: Alert and oriented x 3, moves all extremities x 4 without focal neurological deficits, CN 2-12 intact  Data Reviewed: Basic Metabolic Panel:  Recent Labs Lab 10/16/12 0640 10/17/12 0422 10/18/12 0413 10/19/12 0500 10/20/12 0500  NA 132* 131* 136 132* 133*  K 4.5 3.5 2.7* 3.1* 4.4  CL 98 95* 98 96 100  CO2 25 27 30 24 29   GLUCOSE 106* 102* 91 85 113*  BUN 9 10 10 10 16   CREATININE 0.82 0.83 0.84 0.81 0.86  CALCIUM 8.5 8.4 8.5 8.7 8.5  MG  --   --   --  1.5  --    Liver Function Tests:  Recent Labs Lab 10/16/12 0640  AST 39*  ALT 17  ALKPHOS 123*  BILITOT 0.3  PROT 6.4  ALBUMIN 1.5*    Recent Labs Lab 10/15/12 1452  LIPASE 17   CBC:  Recent Labs Lab 10/16/12 0640 10/17/12 0422 10/18/12 0413 10/19/12 0744 10/20/12 0500  WBC 24.1* 23.0* 19.2* 22.0* 22.8*  NEUTROABS  --   --   --  16.6*  --   HGB 10.6* 9.9* 9.2* 10.2* 10.1*  HCT 31.9* 29.9* 27.5* 31.0* 30.0*  MCV 90.4 89.3 88.7 89.1 88.2  PLT 315 284 268 265 273   BNP (last 3 results)  Recent Labs  09/20/12 1316 10/15/12 1200  PROBNP 333.7* 327.5*    CBG:  Recent Labs Lab 10/18/12 1125 10/18/12 2348 10/19/12 0715 10/20/12 0821 10/20/12 1200  GLUCAP 109* 115* 106* 119* 141*    Recent Results (from the past 240 hour(s))  MRSA PCR SCREENING     Status: None   Collection Time    10/15/12  5:12 PM      Result Value Range Status   MRSA by PCR NEGATIVE  NEGATIVE Final   Comment:            The GeneXpert MRSA Assay (FDA     approved for NASAL specimens     only), is one component of a     comprehensive MRSA colonization     surveillance program. It is not     intended to diagnose MRSA     infection nor to guide or     monitor treatment for     MRSA infections.     Studies:  Recent x-ray studies have been reviewed in detail by the Attending Physician  Scheduled Meds:  Reviewed in detail by the Attending Physician   Junious Silk, ANP [facility] Office  [phone] Pager [phone]  On-Call/Text Page:      [email or web address]      password [email or web address]  If 7PM-7AM, please contact night-coverage [email or web address] Password  [email or web address] 10/20/2012, 12:40 PM   LOS: 5 days   I have personally examined this patient and reviewed the entire database. I have reviewed the above note, made any necessary editorial changes, and agree with its content.  Lonia Blood, MD [facility]

## 2012-10-20 NOTE — Progress Notes (Signed)
NUTRITION FOLLOW UP  Intervention:   1.  Supplements; continue Resource Breeze po TID, each supplement provides 250 kcal and 9 grams of protein.  Add Magic cups BID with trays for additional 290 kcal, 9g protein. 2. General healthful diet; discussed nutrition-related needs and concerns with pt. Encouraged intake as able.  Pt needs assistance with meals due to shaking hands.  Nutrition Dx:   Inadequate oral intake related to weakness, shortness of breath as evidenced by pt report.   Monitor:  1. Food/Beverage; improvement in intake to meet >/=90% estimated needs  Assessment:   Pt admitted with shortness of breath.  RD assessed on (5/3) and determined pt with poor intake and ability for self-care at home.  Pt continues with shortness of breath which she feels is closer to baseline.  Pt with decreased appetite which she attributes to medications.  Granddaughter at bedside states pt's intake improved slightly today but overall remains poor. RD discussed needs and ways to improve calorie density and protein intake.  Ordered supplements for pt.  RD to follow.   Height: Ht Readings from Last 1 Encounters:  10/15/12 5\' 4"  (1.626 m)    Weight Status:   Wt Readings from Last 1 Encounters:  10/20/12 214 lb 15.2 oz (97.5 kg)    Re-estimated needs:  Kcal: 1780-1980 Protein: 85-100g Fluid: >1.8 L/day  Skin: intact  Diet Order: General   Intake/Output Summary (Last 24 hours) at 10/20/12 1315 Last data filed at 10/20/12 1200  Gross per 24 hour  Intake    265 ml  Output   2225 ml  Net  -1960 ml    Last BM: 5/5  Labs:   Recent Labs Lab 10/18/12 0413 10/19/12 0500 10/20/12 0500  NA 136 132* 133*  K 2.7* 3.1* 4.4  CL 98 96 100  CO2 30 24 29   BUN 10 10 16   CREATININE 0.84 0.81 0.86  CALCIUM 8.5 8.7 8.5  MG  --  1.5  --   GLUCOSE 91 85 113*    CBG (last 3)   Recent Labs  10/19/12 0715 10/20/12 0821 10/20/12 1200  GLUCAP 106* 119* 141*    Scheduled Meds: .  allopurinol  100 mg Oral Daily  . antiseptic oral rinse  15 mL Mouth Rinse BID  . benzonatate  100 mg Oral TID  . budesonide  0.25 mg Nebulization BID  . ceFEPIme (MAXIPIME) 2 GM IVP  2 g Intravenous Q8H  . docusate sodium  100 mg Oral BID  . enoxaparin (LOVENOX) injection  40 mg Subcutaneous Q24H  . famotidine  20 mg Oral BID  . feeding supplement  1 Container Oral TID BM  . fluticasone  1 spray Each Nare Daily  . folic acid  2 mg Oral Daily  . guaiFENesin  600 mg Oral BID  . insulin aspart  0-9 Units Subcutaneous TID WC  . ipratropium  0.5 mg Nebulization Q6H  . levalbuterol  0.63 mg Nebulization Q6H  . levofloxacin (LEVAQUIN) IV  750 mg Intravenous Q24H  . levothyroxine  50 mcg Oral QAC breakfast  . loratadine  10 mg Oral QHS  . [START ON 10/21/2012] methotrexate  7.5 mg Oral Q Thu  . montelukast  10 mg Oral QHS  . multivitamin with minerals  1 tablet Oral Daily  . nystatin cream   Topical BID  . pantoprazole  40 mg Oral BID  . potassium chloride SA  40 mEq Oral TID  . predniSONE  60 mg Oral Q breakfast  Continuous Infusions: . sodium chloride 50 mL/hr at 10/20/12 1241    Loyce Dys, MS RD LDN Clinical Inpatient Dietitian Pager: [phone] Weekend/After hours pager: [phone]

## 2012-10-20 NOTE — Evaluation (Signed)
Physical Therapy Evaluation Patient Details Name: Jacqueline Washington MRN: [id number] DOB: 1950/09/18 Today's Date: 10/20/2012 Time: 4098-1191 PT Time Calculation (min): 31 min  PT Assessment / Plan / Recommendation Clinical Impression  Pt is a 62 yo female with COPD and healthcare associated pneumonia. Pt is extremely deconditioned evidenced by SpO2 82-88 on 4.5 LO2 via [place] throughout treatment session with static sitting, standing and ambulating, however is functioning near baseline per family report. Pt reports SOB throughout session with requests for rest breaks. Pt would benefit from acute PT to address deconditioning, activity tolerance, and safe transfers. Pt requires 24/7 assistance for safe return home and HHPT to address deconditioning, activity tolerance and transfer safety.    PT Assessment  Patient needs continued PT services    Follow Up Recommendations  Home health PT;Supervision/Assistance - 24 hour    Does the patient have the potential to tolerate intense rehabilitation      Barriers to Discharge        Equipment Recommendations  Rolling walker with 5" wheels    Recommendations for Other Services     Frequency Min 3X/week    Precautions / Restrictions Precautions Precautions: Fall (Pt w/ 2 falls when transfering with daughter in hospital)   Pertinent Vitals/Pain Orthostatic Hypotension assessment:  RN notified Supine 144/69 Sitting 142/85 Standing 156/88       Mobility  Bed Mobility Bed Mobility: Supine to Sit Supine to Sit: With rails;3: Mod assist;HOB flat Transfers Transfers: Sit to Stand;Stand to Sit Sit to Stand: 4: Min assist;With upper extremity assist;From bed (difficulty following commands for hand placement, SpO2 84%) Stand to Sit: 4: Min assist;To chair/3-in-1;With armrests (pt w/ SOB needing to sit after ambulating 20 ft SpO2 82%) Details for Transfer Assistance: Pt requires v/c's for hand placement and safe transfer technique. Pt with difficulty  following commands for hand placement and breathing techniques. Ambulation/Gait Ambulation/Gait Assistance: 4: Min assist Ambulation Distance (Feet): 20 Feet (2 standing rest breaks) Assistive device: Rolling walker Gait Pattern: Step-through pattern;Trunk flexed;Narrow base of support;Decreased stride length Gait velocity: slow General Gait Details: Pt requires v/c for turning and initiates 2 standing rest breaks. SpO2 82% following ambulation. Pt unable to take standing rest break, needed to sit. Pt reports SpO2 of 84% is normal and she feels only slightly more short of breath than she did PTA. Pt rapidly mouth breathing and instructed on breathing in through her nose and out through her mouth. Pt non-compliant with multiple reminders.     Exercises     PT Diagnosis: Generalized weakness;Difficulty walking  PT Problem List: Decreased strength;Decreased activity tolerance;Decreased balance;Decreased knowledge of use of DME;Decreased safety awareness;Cardiopulmonary status limiting activity PT Treatment Interventions: DME instruction;Gait training;Stair training;Therapeutic activities;Therapeutic exercise   PT Goals Acute Rehab PT Goals PT Goal Formulation: With patient Time For Goal Achievement: 11/03/12 Potential to Achieve Goals: Good Pt will go Supine/Side to Sit: with modified independence;with HOB 0 degrees PT Goal: Supine/Side to Sit - Progress: Goal set today Pt will Transfer Bed to Chair/Chair to Bed: with supervision PT Transfer Goal: Bed to Chair/Chair to Bed - Progress: Goal set today Pt will Stand: with modified independence;with bilateral upper extremity support;3 - 5 min PT Goal: Stand - Progress: Goal set today Pt will Ambulate: 16 - 50 feet;with least restrictive assistive device;with supervision PT Goal: Ambulate - Progress: Goal set today  Visit Information  Last PT Received On: 10/20/12 Assistance Needed: +1    Subjective Data  Subjective: Pt recieved supine in  bed with  family present. Agreeable to PT   Prior Functioning  Home Living Lives With: Significant other Available Help at Discharge: Family;Available 24 hours/day Type of Home: House Home Access: Stairs to enter [employer] of Steps: 3-4 Entrance Stairs-Rails: None Home Layout: One level Bathroom Shower/Tub: Engineer, manufacturing systems: Standard Bathroom Accessibility: No Home Adaptive Equipment: Grab bars in shower;Hand-held shower hose;Tub transfer bench Prior Function Level of Independence: Needs assistance. Pt with decreased activity tolerance since 09/2012. Granddaugther reports she was having difficulty just taking 15 steps to bathroom. Needs Assistance: Bathing;Meal Prep;Light Housekeeping Bath: Moderate Meal Prep: Total Light Housekeeping: Total Comments: Husband assists pt with bathing and does all cleaning and cooking for the couple. Pt dresses herself Communication Communication: No difficulties    Cognition  Cognition Arousal/Alertness: Lethargic Behavior During Therapy: WFL for tasks assessed/performed Overall Cognitive Status: Within Functional Limits for tasks assessed    Extremity/Trunk Assessment Right Upper Extremity Assessment RUE ROM/Strength/Tone: WFL for tasks assessed Left Upper Extremity Assessment LUE ROM/Strength/Tone: WFL for tasks assessed Right Lower Extremity Assessment RLE ROM/Strength/Tone: WFL for tasks assessed Left Lower Extremity Assessment LLE ROM/Strength/Tone: WFL for tasks assessed   Balance Balance Balance Assessed: Yes Static Sitting Balance Static Sitting - Balance Support: No upper extremity supported;Feet supported Static Sitting - Level of Assistance: 5: Stand by assistance (SBA for eval, trunk flexed) Static Sitting - Comment/# of Minutes: 2  End of Session PT - End of Session Equipment Utilized During Treatment: Gait belt Activity Tolerance: Patient limited by fatigue (Pt continually reporting SOB ) Patient  left: in chair;with call bell/phone within reach;with family/visitor present Nurse Communication:  (vitals)  GP    10/20/2012, 2:36 PM Marvis Moeller, Student Physical Therapist Office #: [phone]   agree with above assessment.  Lewis Shock, PT, DPT Pager #: [phone] Office #: [phone]

## 2012-10-21 LAB — GLUCOSE, CAPILLARY
Glucose-Capillary: 92 mg/dL (ref 70–99)
Glucose-Capillary: 106 mg/dL — ABNORMAL HIGH (ref 70–99)
Glucose-Capillary: 141 mg/dL — ABNORMAL HIGH (ref 70–99)
Glucose-Capillary: 126 mg/dL — ABNORMAL HIGH (ref 70–99)

## 2012-10-21 LAB — CBC
Hemoglobin: 9.8 g/dL — ABNORMAL LOW (ref 12.0–15.0)
MCH: 29.6 pg (ref 26.0–34.0)
Platelets: 276 10*3/uL (ref 150–400)
RBC: 3.31 MIL/uL — ABNORMAL LOW (ref 3.87–5.11)
WBC: 22 10*3/uL — ABNORMAL HIGH (ref 4.0–10.5)

## 2012-10-21 LAB — BASIC METABOLIC PANEL
Calcium: 8.7 mg/dL (ref 8.4–10.5)
GFR calc Af Amer: 89 mL/min — ABNORMAL LOW (ref >90)
GFR calc non Af Amer: 77 mL/min — ABNORMAL LOW (ref >90)
Glucose, Bld: 94 mg/dL (ref 70–99)
Potassium: 4.7 mEq/L (ref 3.5–5.1)
Sodium: 139 mEq/L (ref 135–145)

## 2012-10-21 MED ORDER — ZOLPIDEM TARTRATE 5 MG PO TABS
5.0000 mg | ORAL_TABLET | Freq: Once | ORAL | Status: AC
  Administered 2012-10-21: 5 mg via ORAL
  Filled 2012-10-21: qty 1

## 2012-10-21 MED ORDER — ZOLPIDEM TARTRATE 5 MG PO TABS
5.0000 mg | ORAL_TABLET | Freq: Once | ORAL | Status: AC
  Administered 2012-10-22: 5 mg via ORAL
  Filled 2012-10-21: qty 1

## 2012-10-21 NOTE — Evaluation (Signed)
Occupational Therapy Evaluation Patient Details Name: Jacqueline Washington MRN: [id number] DOB: 1951/03/28 Today's Date: 10/21/2012 Time: 7829-5621 OT Time Calculation (min): 25 min  OT Assessment / Plan / Recommendation Clinical Impression   This 62 y.o. Female with 02 dependent COPD and RA amitted with SOB and coughing.  Pt diagnosed with HCAP.  Pt with significant deconditioning and generalized weakness.  She fatigues rapidly with minimal activity.  She will benefit from continued OT to maximize safety and independence with BADLs to allow her to return home with family at min A-mod A level with ADLs.    OT Assessment  Patient needs continued OT Services    Follow Up Recommendations  Home health OT;Supervision/Assistance - 24 hour    Barriers to Discharge None    Equipment Recommendations  3 in 1 bedside comode    Recommendations for Other Services    Frequency  Min 2X/week    Precautions / Restrictions Precautions Precautions: Fall (Pt w/ 2 falls when transfering with daughter in hospital)       ADL  Eating/Feeding: Set up Where Assessed - Eating/Feeding: Chair Grooming: Wash/dry hands;Wash/dry face;Teeth care;Minimal assistance Where Assessed - Grooming: Supported sitting Upper Body Bathing: Maximal assistance Where Assessed - Upper Body Bathing: Supported sitting Lower Body Bathing: +1 Total assistance Where Assessed - Lower Body Bathing: Supported sit to stand Upper Body Dressing: Maximal assistance Where Assessed - Upper Body Dressing: Unsupported sitting Lower Body Dressing: +1 Total assistance Where Assessed - Lower Body Dressing: Supported sit to Pharmacist, hospital: Minimal assistance Toilet Transfer Method: Sit to stand;Stand pivot Acupuncturist: Bedside commode Toileting - Clothing Manipulation and Hygiene: +1 Total assistance Where Assessed - Toileting Clothing Manipulation and Hygiene: Standing Transfers/Ambulation Related to ADLs: min A for sit to  stand, but pt with very little reserve and fatigues quickly ADL Comments: Pt with minimal abillity to engage in ADL activities due to poor activity tolerance and DOE 4/4    OT Diagnosis: Generalized weakness  OT Problem List: Decreased strength;Decreased activity tolerance;Impaired balance (sitting and/or standing);Decreased knowledge of use of DME or AE;Decreased knowledge of precautions;Cardiopulmonary status limiting activity;Obesity OT Treatment Interventions: Self-care/ADL training;Therapeutic exercise;DME and/or AE instruction;Therapeutic activities;Patient/family education;Balance training   OT Goals Acute Rehab OT Goals OT Goal Formulation: With patient/family Time For Goal Achievement: 11/04/12 Potential to Achieve Goals: Good ADL Goals Pt Will Perform Grooming: with min assist;Standing at sink ADL Goal: Grooming - Progress: Goal set today Pt Will Perform Upper Body Bathing: with min assist;Sitting, chair;Sitting, edge of bed ADL Goal: Upper Body Bathing - Progress: Goal set today Pt Will Perform Lower Body Bathing: with mod assist;Sit to stand from chair;Sit to stand from bed ADL Goal: Lower Body Bathing - Progress: Goal set today Pt Will Perform Upper Body Dressing: with min assist;Sitting, chair;Sitting, bed ADL Goal: Upper Body Dressing - Progress: Goal set today Pt Will Perform Lower Body Dressing: with mod assist;Sit to stand from chair;Sit to stand from bed ADL Goal: Lower Body Dressing - Progress: Goal set today Pt Will Transfer to Toilet: with min assist;Ambulation;Comfort height toilet ADL Goal: Toilet Transfer - Progress: Goal set today Pt Will Perform Toileting - Clothing Manipulation: with min assist;Standing ADL Goal: Toileting - Clothing Manipulation - Progress: Goal set today Additional ADL Goal #1: Pt will incorporate energy conservation techniques into daily activities with min cues ADL Goal: Additional Goal #1 - Progress: Goal set today Additional ADL Goal #2:  Pt will participate in 20 mins therapeutic activity with DOE no greater  than 3/4 and no more than 2 rest breaks ADL Goal: Additional Goal #2 - Progress: Goal set today  Visit Information  Last OT Received On: 10/21/12 Assistance Needed: +1    Subjective Data  Subjective: "I'm tired" Patient Stated Goal: To get stronger   Prior Functioning     Home Living Lives With: Significant other Available Help at Discharge: Family;Available 24 hours/day Type of Home: House Home Access: Stairs to enter [employer] of Steps: 3-4 Entrance Stairs-Rails: None Home Layout: One level Bathroom Shower/Tub: Engineer, manufacturing systems: Standard Bathroom Accessibility: No Home Adaptive Equipment: Grab bars in shower;Hand-held shower hose;Tub transfer bench Prior Function Level of Independence: Needs assistance Needs Assistance: Bathing;Meal Prep;Light Housekeeping Bath: Moderate Meal Prep: Total Light Housekeeping: Total Comments: Pt and g-dtr report a signficant decline in function since last admission Communication Communication: No difficulties         Vision/Perception     Cognition  Cognition Arousal/Alertness: Awake/alert Behavior During Therapy: WFL for tasks assessed/performed Overall Cognitive Status: Within Functional Limits for tasks assessed    Extremity/Trunk Assessment Right Upper Extremity Assessment RUE ROM/Strength/Tone: [facility] for tasks assessed;Deficits RUE ROM/Strength/Tone Deficits: 4/5 RUE Coordination: WFL - gross/fine motor Left Upper Extremity Assessment LUE ROM/Strength/Tone: Deficits LUE ROM/Strength/Tone Deficits: 4/5 LUE Coordination: WFL - gross/fine motor     Mobility Bed Mobility Bed Mobility: Not assessed Transfers Transfers: Sit to Stand;Stand to Sit Sit to Stand: 4: Min assist;With upper extremity assist;From chair/3-in-1 Stand to Sit: 4: Min assist;With upper extremity assist;To chair/3-in-1 Details for Transfer Assistance:  cues for breathing     Exercise Other Exercises Other Exercises: Pt performed 10 reps shoulder flexion with 2 short rest breaks.  Instructed her to perform at least one more time this pm, and again tomorrow   Balance     End of Session OT - End of Session Activity Tolerance: Patient limited by fatigue Patient left: in chair;with call bell/phone within reach;with family/visitor present Nurse Communication: Mobility status  GO     Khallid Pasillas, Ursula Alert M 10/21/2012, 5:06 PM

## 2012-10-21 NOTE — Progress Notes (Signed)
Speech Language Pathology Dysphagia Treatment Patient Details Name: Jacqueline Washington MRN: [id number] DOB: 1951-06-11 Today's Date: 10/21/2012 Time: 4098-1191 SLP Time Calculation (min): 8 min  Assessment / Plan / Recommendation Clinical Impression  Pt. seen for safety with po's primarily in regards to her decreased respiratory status.  RN, daughter and daughter do not report difficulty masticating solids or with thin liquids.  Pt. given tactile assist to stabalize cup and min verbal cues for small sips.  Daughter asked reasoning for no straws which SLP explained she penetrated with straw during MBS and reasoning to consume liquids via cup.  SLP also explained pt's increased risk of swallowing with respiratory difficulty.  SLP will sign off. Please reconsult if needed.    Diet Recommendation  Continue with Current Diet: Regular;Thin liquid    SLP Plan  (partially met)   Pertinent Vitals/Pain none   Swallowing Goals  SLP Swallowing Goals Patient will utilize recommended strategies during swallow to increase swallowing safety with: Supervision/safety Swallow Study Goal #2 - Progress: Partly met  General Temperature Spikes Noted: No Respiratory Status: Supplemental O2 delivered via (comment) Behavior/Cognition: Lethargic;Cooperative Oral Cavity - Dentition: Dentures, top;Missing dentition Patient Positioning: Upright in chair  Oral Cavity - Oral Hygiene Does patient have any of the following "at risk" factors?: Oxygen therapy - cannula, mask, simple oxygen devices Brush patient's teeth BID with toothbrush (using toothpaste with fluoride): Yes Patient is AT RISK - Oral Care Protocol followed (see row info): Yes   Dysphagia Treatment Treatment focused on: Skilled observation of diet tolerance;Patient/family/caregiver education Family/Caregiver Educated: daughter Treatment Methods/Modalities: Skilled observation Patient observed directly with PO's: Yes Type of PO's observed: Thin  liquids Feeding: Able to feed self;Needs assist;Needs set up Liquids provided via: Cup;No straw Type of cueing: Verbal Amount of cueing: Minimal   GO     Royce Macadamia M.Ed [employer] [phone]  10/21/2012

## 2012-10-21 NOTE — Progress Notes (Signed)
[facility] Progress Note [place] TEAM 1 - Stepdown/ICU TEAM   Jacqueline Washington ZOX:[id number] DOB: 1950/06/23 DOA: 10/15/2012 PCP: Nadean Corwin, MD  Brief narrative: 62 year old female patient with O2-dependent COPD, rheumatoid arthritis and diabetes. Presented to the emergency department with reports of shortness of breath and coughing. Recently hospitalized for 3 days in early April for COPD exacerbation. She was discharged on doxycycline and a prednisone taper which was completed. She returned to the pulmonology office on 10/06/2012 and at that time her usual Advair was discontinued because she was unable to obtain because of her insurance coverage. She was transitioned to budesonide nebulizer twice a day along with Proventil nebulizer. Lasix was increased to 40 mg daily for chronic lower extremity edema and chronic diastolic heart failure. On the date of this admission she returned her primary care physician office because of progressive shortness of breath and worsening nonproductive cough with right-sided pleurisy, chills and posttussive vomiting. In the physician's office her oxygen saturation was 80% on 3 L of oxygen so she was instructed to report to the emergency department. In the emergency department she was tachycardic with a heart rate in the 120s but was normotensive. On 3 L of nasal cannula oxygen she was saturating 89%. EKG was unremarkable except for sinus tachycardia. Chest x-ray revealed diffuse bilateral infiltrates with a worsening infiltrate/consolidation in the right upper lobe and right perihilar region consistent with a worsening pneumonia. She had leukocytosis of 22,000. Upon evaluation of the patient admitting physician ordered an ABG was demonstrated a PO2 of 70 on 3 L of oxygen.  Assessment/Plan:   Acute and chronic respiratory failure with hypoxia due to:   A) HCAP (healthcare-associated pneumonia)   B) COPD, severe   C) mild pulmonary HTN -CT chest  revealed dense right side PNA with changes c/w severe COPD-plain CXR also with same dense PNA -cont current anbx's -supportive care with pulmonary toilet/IS and nebs -cont Prednisone/taper slowly -wean O2 as tolerated (baseline O2 3 L) -mobiilze (see below) -WBC worsening and influenced by steroids/remains afebrile    Sepsis -hemodynamically stable -as above- pulmonary source    Chronic diastolic heart failure, NYHA class 1 -seems compensated -Initially ? volume overload esp with prior I>O and was challenged with Lasix but later suspected edema due to low albumin state (see below)    Hypothyroidism -cont Synthroid -TSH low at 0.130 but due to sick euthyroid syndrome since T3/T4 normal    Diabetes mellitus, type II -controlled -cont SSI    Dehydration/Anorexia -very poor PO intake since admit but has improved over past 24 hrs  -had fall 5/6 which could have been due to orthostasis -borderline orthostatic-Lasix dc'd 5/8 -cont IVF at 50/hr -change Ensure to Resource (pt request)    Hyponatremia -suspect due to dehydration- Na+ has increased with hydration -follow lytes    Morbid obesity -very deconditioned and mentioned to pt/family need to mobilized to avoid dc to SNF    Protein-calorie malnutrition, severe -nutrition consult   Rheumatoid arthritis -weekly Methotrexate at home- last dose 5/8   DVT prophylaxis: Lovenox Code Status: Full Family Communication: Patient and family at bedside Disposition Plan: Remain in step down Isolation: None  Consultants: None  Procedures: 2-D echocardiogram: Preserved LV function EF 60-65%, grade 1 diastolic dysfunction, mild pulmonary hypertension 43 mmHg  Antibiotics: Cefepime 5/2 >> Levaquin 5/6 >> Flagyl 5/2 >>> 5/5 Vancomycin 5/2 >> 5/5  HPI/Subjective: Patient alert and still endorses DOE and generalized weakness-son says she's eating better.   Objective: Blood  pressure 144/79, pulse 112, temperature 98.7 F  (37.1 C), temperature source Oral, resp. rate 30, height 5\' 4"  (1.626 m), weight 98 kg (216 lb 0.8 oz), SpO2 94.00%.  Intake/Output Summary (Last 24 hours) at 10/21/12 1116 Last data filed at 10/21/12 0624  Gross per 24 hour  Intake 1225.83 ml  Output   2400 ml  Net -1174.17 ml     Exam: General: No acute respiratory distress Lungs: Coarse to auscultation bilaterally with scattered expiratory crackles bilaterally but also quite diminished right side, 3 L Cardiovascular: Regular rate, occasionally tachycardic without murmur gallop or rub normal S1 and S2, trace peripheral edema Abdomen: Diffuse tender without guarding or rebounding consistent with musculoskeletal etiology after coughing, nondistended, soft, bowel sounds positive, no rebound, no ascites, no appreciable mass Musculoskeletal: No significant cyanosis, clubbing of bilateral lower extremities Neurological: Alert and oriented x 3, moves all extremities x 4 without focal neurological deficits, CN 2-12 intact  Data Reviewed: Basic Metabolic Panel:  Recent Labs Lab 10/17/12 0422 10/18/12 0413 10/19/12 0500 10/20/12 0500 10/21/12 0355  NA 131* 136 132* 133* 139  K 3.5 2.7* 3.1* 4.4 4.7  CL 95* 98 96 100 106  CO2 27 30 24 29 26   GLUCOSE 102* 91 85 113* 94  BUN 10 10 10 16 21   CREATININE 0.83 0.84 0.81 0.86 0.81  CALCIUM 8.4 8.5 8.7 8.5 8.7  MG  --   --  1.5  --   --    Liver Function Tests:  Recent Labs Lab 10/16/12 0640  AST 39*  ALT 17  ALKPHOS 123*  BILITOT 0.3  PROT 6.4  ALBUMIN 1.5*    Recent Labs Lab 10/15/12 1452  LIPASE 17   CBC:  Recent Labs Lab 10/17/12 0422 10/18/12 0413 10/19/12 0744 10/20/12 0500 10/21/12 0355  WBC 23.0* 19.2* 22.0* 22.8* 22.0*  NEUTROABS  --   --  16.6*  --   --   HGB 9.9* 9.2* 10.2* 10.1* 9.8*  HCT 29.9* 27.5* 31.0* 30.0* 29.3*  MCV 89.3 88.7 89.1 88.2 88.5  PLT 284 268 265 273 276   BNP (last 3 results)  Recent Labs  09/20/12 1316 10/15/12 1200   PROBNP 333.7* 327.5*   CBG:  Recent Labs Lab 10/20/12 0821 10/20/12 1200 10/20/12 1606 10/20/12 2116 10/21/12 0746  GLUCAP 119* 141* 121* 107* 92    Recent Results (from the past 240 hour(s))  MRSA PCR SCREENING     Status: None   Collection Time    10/15/12  5:12 PM      Result Value Range Status   MRSA by PCR NEGATIVE  NEGATIVE Final   Comment:            The GeneXpert MRSA Assay (FDA     approved for NASAL specimens     only), is one component of a     comprehensive MRSA colonization     surveillance program. It is not     intended to diagnose MRSA     infection nor to guide or     monitor treatment for     MRSA infections.     Studies:  Recent x-ray studies have been reviewed in detail by the Attending Physician  Scheduled Meds:  Reviewed in detail by the Attending Physician   Jacqueline Washington, ANP [facility] Office  [phone] Pager [phone]  On-Call/Text Page:      [email or web address]      password [email or web address]  If 7PM-7AM, please contact night-coverage [email or web address] Password [email or web address]  10/21/2012, 11:16 AM   LOS: 6 days    I have examined the patient, reviewed the chart and modified the above note which I agree with.   Carlo Guevarra,MD [phone] 10/21/2012, 3:29 PM

## 2012-10-22 DIAGNOSIS — E860 Dehydration: Secondary | ICD-10-CM

## 2012-10-22 DIAGNOSIS — J9600 Acute respiratory failure, unspecified whether with hypoxia or hypercapnia: Secondary | ICD-10-CM

## 2012-10-22 LAB — BASIC METABOLIC PANEL
CO2: 25 mEq/L (ref 19–32)
Calcium: 8.5 mg/dL (ref 8.4–10.5)
Creatinine, Ser: 0.77 mg/dL (ref 0.50–1.10)
GFR calc Af Amer: >90 mL/min (ref >90)
GFR calc non Af Amer: 89 mL/min — ABNORMAL LOW (ref >90)
Sodium: 140 mEq/L (ref 135–145)

## 2012-10-22 MED ORDER — PREDNISONE 20 MG PO TABS
20.0000 mg | ORAL_TABLET | Freq: Every day | ORAL | Status: AC
  Administered 2012-10-25 – 2012-10-26 (×2): 20 mg via ORAL
  Filled 2012-10-22 (×2): qty 1

## 2012-10-22 MED ORDER — PREDNISONE 20 MG PO TABS
40.0000 mg | ORAL_TABLET | Freq: Every day | ORAL | Status: AC
  Administered 2012-10-23 – 2012-10-24 (×2): 40 mg via ORAL
  Filled 2012-10-22 (×4): qty 2

## 2012-10-22 MED ORDER — IPRATROPIUM BROMIDE 0.02 % IN SOLN
0.5000 mg | Freq: Three times a day (TID) | RESPIRATORY_TRACT | Status: DC
  Administered 2012-10-23 – 2012-10-24 (×4): 0.5 mg via RESPIRATORY_TRACT
  Filled 2012-10-22 (×4): qty 2.5

## 2012-10-22 MED ORDER — PREDNISONE 10 MG PO TABS
10.0000 mg | ORAL_TABLET | Freq: Every day | ORAL | Status: DC
  Filled 2012-10-22: qty 1

## 2012-10-22 MED ORDER — LEVALBUTEROL HCL 0.63 MG/3ML IN NEBU
0.6300 mg | INHALATION_SOLUTION | Freq: Three times a day (TID) | RESPIRATORY_TRACT | Status: DC
  Administered 2012-10-23 – 2012-10-24 (×4): 0.63 mg via RESPIRATORY_TRACT
  Filled 2012-10-22 (×7): qty 3

## 2012-10-22 NOTE — Progress Notes (Signed)
Physical medicine and rehabilitation consult requested in chart has been reviewed. Physical and occupational therapy evaluations completed for deconditioning and patient progressing nicely after initial evaluation. She should continue to do well and be able to progress to discharge home with home therapies as recommended by therapies. We'll continue to monitor progress

## 2012-10-22 NOTE — Progress Notes (Signed)
Physical Therapy Treatment Patient Details Name: Jacqueline Washington MRN: [id number] DOB: April 30, 1951 Today's Date: 10/22/2012 Time: 4098-1191 PT Time Calculation (min): 34 min  PT Assessment / Plan / Recommendation Comments on Treatment Session  Patient moves fairly well.  Main issue is DOE, with O2 sat decreasing with all mobility.  Encouraged patient to complete exercises 2x/day.    Follow Up Recommendations  Home health PT;Supervision/Assistance - 24 hour     Does the patient have the potential to tolerate intense rehabilitation     Barriers to Discharge        Equipment Recommendations  Rolling walker with 5" wheels    Recommendations for Other Services    Frequency Min 3X/week   Plan Discharge plan remains appropriate;Frequency remains appropriate    Precautions / Restrictions Precautions Precautions: Fall (Pt w/ 2 falls when transfering with daughter in hospital) Restrictions Weight Bearing Restrictions: No   Pertinent Vitals/Pain O2 sat at 87% with activity on 3 l/min; Dyspnea 4/4    Mobility  Bed Mobility Bed Mobility: Supine to Sit;Sitting - Scoot to Edge of Bed Supine to Sit: 4: Min assist;With rails;HOB elevated Sitting - Scoot to [place] of Bed: 4: Min assist;With rail Details for Bed Mobility Assistance: Verbal cues for technique.  Assist to raise her trunk from bed. Transfers Transfers: Sit to Stand;Stand to [employer] Transfers Sit to Stand: 4: Min assist;With upper extremity assist;From bed;With armrests;From chair/3-in-1 Stand to Sit: 4: Min assist;With upper extremity assist;With armrests;To chair/3-in-1 Stand Pivot Transfers: 4: Min assist Details for Transfer Assistance: Cues for safety with transfers.  Ambulation/Gait Ambulation/Gait Assistance: 4: Min assist Ambulation Distance (Feet): 20 Feet Assistive device: Rolling walker Ambulation/Gait Assistance Details: Patient able to take 20 steps in place with RW.   General Gait Details: Verbal cues  throughout session to breathe through nose, not mouth.  Patient had transferred to [facility] for BM.  Able to march in place following transfer.    Exercises General Exercises - Lower Extremity Ankle Circles/Pumps: AROM;Both;10 reps;Seated Long Arc Quad: AROM;Both;10 reps;Seated Hip ABduction/ADduction: AROM;Both;10 reps;Seated Hip Flexion/Marching: AROM;Both;10 reps;Seated     PT Goals Acute Rehab PT Goals PT Goal: Supine/Side to Sit - Progress: Progressing toward goal PT Transfer Goal: Bed to Chair/Chair to Bed - Progress: Progressing toward goal PT Goal: Stand - Progress: Progressing toward goal PT Goal: Ambulate - Progress: Progressing toward goal  Visit Information  Last PT Received On: 10/22/12 Assistance Needed: +1    Subjective Data  Subjective: "I think I can"  When asked if she could work with PT.   Cognition  Cognition Arousal/Alertness: Awake/alert Behavior During Therapy: WFL for tasks assessed/performed Overall Cognitive Status: Within Functional Limits for tasks assessed    Balance     End of Session PT - End of Session Equipment Utilized During Treatment: Gait belt;Oxygen Activity Tolerance: Patient limited by fatigue;Treatment limited secondary to medical complications (Comment) (Dyspnea 4/4 and O2 sats at 87% with activity) Patient left: in chair;with call bell/phone within reach;with family/visitor present Nurse Communication: Mobility status   GP     Shakeitha, Umbaugh 10/22/2012, 1:17 PM Durenda Hurt. Renaldo Fiddler, [facility] Acute Rehab Services Pager [phone]

## 2012-10-22 NOTE — Progress Notes (Signed)
Notified Lenny Pastel, NP on call for [facility] that pt was on Pepcid and Protonix twice daily. Pt states at home she does not take anything for acid reflux. Lenny Pastel, NP placed order to d/c Pepcid.

## 2012-10-22 NOTE — Progress Notes (Signed)
[facility] Progress Note [place] TEAM 1 - Stepdown/ICU TEAM   Jacqueline Washington JYN:[id number] DOB: 03-13-1951 DOA: 10/15/2012 PCP: Nadean Corwin, MD  Brief narrative: 62 year old female patient with O2-dependent COPD, rheumatoid arthritis and diabetes. Presented to the emergency department with reports of shortness of breath and coughing. Recently hospitalized for 3 days in early April for COPD exacerbation. She was discharged on doxycycline and a prednisone taper which was completed. She returned to the pulmonology office on 10/06/2012 and at that time her usual Advair was discontinued because she was unable to obtain because of her insurance coverage. She was transitioned to budesonide nebulizer twice a day along with Proventil nebulizer. Lasix was increased to 40 mg daily for chronic lower extremity edema and chronic diastolic heart failure. On the date of this admission she returned her primary care physician office because of progressive shortness of breath and worsening nonproductive cough with right-sided pleurisy, chills and posttussive vomiting. In the physician's office her oxygen saturation was 80% on 3 L of oxygen so she was instructed to report to the emergency department. In the emergency department she was tachycardic with a heart rate in the 120s but was normotensive. On 3 L of nasal cannula oxygen she was saturating 89%. EKG was unremarkable except for sinus tachycardia. Chest x-ray revealed diffuse bilateral infiltrates with a worsening infiltrate/consolidation in the right upper lobe and right perihilar region consistent with a worsening pneumonia. She had leukocytosis of 22,000. Upon evaluation of the patient admitting physician ordered an ABG was demonstrated a PO2 of 70 on 3 L of oxygen.  Assessment/Plan:   Acute and chronic respiratory failure with hypoxia due to:   A) HCAP (healthcare-associated pneumonia)   B) COPD, severe   C) mild pulmonary HTN -CT chest  revealed dense right side PNA with changes c/w severe COPD-plain CXR also with same dense PNA -cont current anbx's -supportive care with pulmonary toilet/IS and nebs -cont Prednisone taper -wean O2 as tolerated (baseline O2 3 L) -mobiilze (see below) -WBC stable and influenced by steroids/remains afebrile    Sepsis -hemodynamically stable -as above- pulmonary source    Chronic diastolic heart failure, NYHA class 1 -seems compensated -Initially ? volume overload esp with prior I>O and was challenged with Lasix but later suspected edema due to low albumin state (see below)    Hypothyroidism -cont Synthroid -TSH low at 0.130 but due to sick euthyroid syndrome since T3/T4 normal    Diabetes mellitus, type II -controlled and pt denies prior history of DM -had hyperglycemia 2/2 steroids/now weaning so will decrease CBG to BID and DC SSI- consider dc CBG altogether if remains stable    Dehydration/Anorexia -very poor PO intake since admit but has improved over past 24 hrs  -had fall 5/6 which could have been due to orthostasis -borderline orthostatic-Lasix dc'd 5/8 -cont IVF at 50/hr -change Ensure to Resource (pt request)    Hyponatremia -suspect due to dehydration- Na+ has increased with hydration -follow lytes    Morbid obesity/deconditioning -very deconditioned  -PT/OT rec HHPT- CIR also asked to follow since progress is slow and has been sick x 6 weeks    Protein-calorie malnutrition, severe -nutrition consult   Rheumatoid arthritis -weekly Methotrexate at home- last dose 5/8   DVT prophylaxis: Lovenox Code Status: Full Family Communication: Patient and family at bedside Disposition Plan: Transfer to floor Isolation: None  Consultants: None  Procedures: 2-D echocardiogram: Preserved LV function EF 60-65%, grade 1 diastolic dysfunction, mild pulmonary hypertension 43 mmHg  Antibiotics:  Cefepime 5/2 >> Levaquin 5/6 >> Flagyl 5/2 >>> 5/5 Vancomycin 5/2 >>  5/5  HPI/Subjective: Patient alert and weak but able to mobilize with assistance to commode  Objective: Blood pressure 135/65, pulse 99, temperature 97.9 F (36.6 C), temperature source Oral, resp. rate 26, height 5\' 4"  (1.626 m), weight 100.5 kg (221 lb 9 oz), SpO2 92.00%.  Intake/Output Summary (Last 24 hours) at 10/22/12 1041 Last data filed at 10/22/12 0900  Gross per 24 hour  Intake    640 ml  Output   2100 ml  Net  -1460 ml     Exam: General: No acute respiratory distress Lungs: Coarse to auscultation bilaterally with scattered expiratory crackles bilaterally and les diminished right side, 3 L Cardiovascular: Regular rate, occasionally tachycardic without murmur gallop or rub normal S1 and S2, trace peripheral edema Abdomen: Nontender, nondistended, soft, bowel sounds positive, no rebound, no ascites, no appreciable mass Musculoskeletal: No significant cyanosis, clubbing of bilateral lower extremities Neurological: Alert and oriented x 3, moves all extremities x 4 without focal neurological deficits, CN 2-12 intact  Data Reviewed: Basic Metabolic Panel:  Recent Labs Lab 10/18/12 0413 10/19/12 0500 10/20/12 0500 10/21/12 0355 10/22/12 0345  NA 136 132* 133* 139 140  K 2.7* 3.1* 4.4 4.7 5.1  CL 98 96 100 106 108  CO2 30 24 29 26 25   GLUCOSE 91 85 113* 94 87  BUN 10 10 16 21 21   CREATININE 0.84 0.81 0.86 0.81 0.77  CALCIUM 8.5 8.7 8.5 8.7 8.5  MG  --  1.5  --   --   --    Liver Function Tests:  Recent Labs Lab 10/16/12 0640  AST 39*  ALT 17  ALKPHOS 123*  BILITOT 0.3  PROT 6.4  ALBUMIN 1.5*    Recent Labs Lab 10/15/12 1452  LIPASE 17   CBC:  Recent Labs Lab 10/17/12 0422 10/18/12 0413 10/19/12 0744 10/20/12 0500 10/21/12 0355  WBC 23.0* 19.2* 22.0* 22.8* 22.0*  NEUTROABS  --   --  16.6*  --   --   HGB 9.9* 9.2* 10.2* 10.1* 9.8*  HCT 29.9* 27.5* 31.0* 30.0* 29.3*  MCV 89.3 88.7 89.1 88.2 88.5  PLT 284 268 265 273 276   BNP (last 3  results)  Recent Labs  09/20/12 1316 10/15/12 1200  PROBNP 333.7* 327.5*   CBG:  Recent Labs Lab 10/21/12 0746 10/21/12 1237 10/21/12 1658 10/21/12 2135 10/22/12 0814  GLUCAP 92 106* 141* 126* 89    Recent Results (from the past 240 hour(s))  MRSA PCR SCREENING     Status: None   Collection Time    10/15/12  5:12 PM      Result Value Range Status   MRSA by PCR NEGATIVE  NEGATIVE Final   Comment:            The GeneXpert MRSA Assay (FDA     approved for NASAL specimens     only), is one component of a     comprehensive MRSA colonization     surveillance program. It is not     intended to diagnose MRSA     infection nor to guide or     monitor treatment for     MRSA infections.     Studies:  Recent x-ray studies have been reviewed in detail by the Attending Physician  Scheduled Meds:  Reviewed in detail by the Attending Physician   Junious Silk, ANP [facility] Office  [phone] Pager [phone]  On-Call/Text  Page:      [email or web address]      password [email or web address]  If 7PM-7AM, please contact night-coverage [email or web address] Password [email or web address] 10/22/2012, 10:41 AM   LOS: 7 days    I have examined the patient, reviewed the chart and modified the above note which I agree with.   Wilder Kurowski,MD [phone] 10/22/2012, 4:39 PM

## 2012-10-22 NOTE — Progress Notes (Signed)
NUTRITION FOLLOW UP  Intervention:   1.  Supplements; continue Resource Breeze po TID, each supplement provides 250 kcal and 9 grams of protein.  Will request [employer].  Add Magic cups BID with trays for additional 290 kcal, 9g protein. 2.  Modify diet;  Order Low Sodium diet for pt  Nutrition Dx:   Inadequate oral intake related to weakness, shortness of breath as evidenced by pt report.   Monitor:  1. Food/Beverage; improvement in intake to meet >/=90% estimated needs  Assessment:   Pt admitted with shortness of breath.  RD assessed on (5/3) and determined pt with poor intake and ability for self-care at home.  Pt continues with shortness of breath which she feels is closer to baseline.  Pt and family report improvement in intake over the past 1-2 days.  Pt has not yet received Magic Cup- RD to ensure it is added to trays. Pt reports she like the [employer].  She would like to downgrade to Low Sodium diet- RD to order. RD discussed needs and ways to improve calorie density and protein intake.  Ordered supplements for pt.  RD to follow.   Height: Ht Readings from Last 1 Encounters:  10/15/12 5\' 4"  (1.626 m)    Weight Status:   Wt Readings from Last 1 Encounters:  10/22/12 221 lb 9 oz (100.5 kg)    Re-estimated needs:  Kcal: 1780-1980 Protein: 85-100g Fluid: >1.8 L/day  Skin: intact  Diet Order: General   Intake/Output Summary (Last 24 hours) at 10/22/12 1252 Last data filed at 10/22/12 1151  Gross per 24 hour  Intake    640 ml  Output   2150 ml  Net  -1510 ml    Last BM: 5/8  Labs:   Recent Labs Lab 10/18/12 0413 10/19/12 0500 10/20/12 0500 10/21/12 0355 10/22/12 0345  NA 136 132* 133* 139 140  K 2.7* 3.1* 4.4 4.7 5.1  CL 98 96 100 106 108  CO2 30 24 29 26 25   BUN 10 10 16 21 21   CREATININE 0.84 0.81 0.86 0.81 0.77  CALCIUM 8.5 8.7 8.5 8.7 8.5  MG  --  1.5  --   --   --   GLUCOSE 91 85 113* 94 87    CBG (last 3)   Recent  Labs  10/21/12 2135 10/22/12 0814 10/22/12 1152  GLUCAP 126* 89 100*    Scheduled Meds: . allopurinol  100 mg Oral Daily  . antiseptic oral rinse  15 mL Mouth Rinse BID  . benzonatate  100 mg Oral TID  . budesonide  0.25 mg Nebulization BID  . ceFEPIme (MAXIPIME) 2 GM IVP  2 g Intravenous Q8H  . docusate sodium  100 mg Oral BID  . enoxaparin (LOVENOX) injection  40 mg Subcutaneous Q24H  . famotidine  20 mg Oral BID  . feeding supplement  1 Container Oral TID BM  . fluticasone  1 spray Each Nare Daily  . folic acid  2 mg Oral Daily  . guaiFENesin  600 mg Oral BID  . ipratropium  0.5 mg Nebulization Q6H  . levalbuterol  0.63 mg Nebulization Q6H  . levofloxacin (LEVAQUIN) IV  750 mg Intravenous Q24H  . levothyroxine  50 mcg Oral QAC breakfast  . loratadine  10 mg Oral QHS  . methotrexate  7.5 mg Oral Q Thu  . montelukast  10 mg Oral QHS  . multivitamin with minerals  1 tablet Oral Daily  . nystatin cream  Topical BID  . pantoprazole  40 mg Oral BID  . [START ON 10/23/2012] predniSONE  40 mg Oral Q breakfast   Followed by  . [START ON 10/25/2012] predniSONE  20 mg Oral Q breakfast   Followed by  . [START ON 10/27/2012] predniSONE  10 mg Oral Q breakfast    Continuous Infusions: . sodium chloride 50 mL/hr at 10/21/12 1157    Loyce Dys, MS RD LDN Clinical Inpatient Dietitian Pager: [phone] Weekend/After hours pager: [phone]

## 2012-10-23 ENCOUNTER — Inpatient Hospital Stay (HOSPITAL_COMMUNITY): Payer: Medicare Other

## 2012-10-23 DIAGNOSIS — R630 Anorexia: Secondary | ICD-10-CM

## 2012-10-23 LAB — BASIC METABOLIC PANEL
CO2: 30 mEq/L (ref 19–32)
Calcium: 8.3 mg/dL — ABNORMAL LOW (ref 8.4–10.5)
Glucose, Bld: 83 mg/dL (ref 70–99)
Sodium: 140 mEq/L (ref 135–145)

## 2012-10-23 LAB — CBC
HCT: 30 % — ABNORMAL LOW (ref 36.0–46.0)
Hemoglobin: 9.7 g/dL — ABNORMAL LOW (ref 12.0–15.0)
MCH: 29.4 pg (ref 26.0–34.0)
MCHC: 32.3 g/dL (ref 30.0–36.0)
MCV: 90.9 fL (ref 78.0–100.0)
Platelets: 253 10*3/uL (ref 150–400)
RBC: 3.3 MIL/uL — ABNORMAL LOW (ref 3.87–5.11)
RDW: 16.6 % — ABNORMAL HIGH (ref 11.5–15.5)
WBC: 17.2 10*3/uL — ABNORMAL HIGH (ref 4.0–10.5)

## 2012-10-23 LAB — GLUCOSE, CAPILLARY: Glucose-Capillary: 158 mg/dL — ABNORMAL HIGH (ref 70–99)

## 2012-10-23 MED ORDER — LEVOFLOXACIN 750 MG PO TABS
750.0000 mg | ORAL_TABLET | ORAL | Status: DC
  Administered 2012-10-24 – 2012-10-25 (×2): 750 mg via ORAL
  Filled 2012-10-23 (×4): qty 1

## 2012-10-23 NOTE — Progress Notes (Signed)
[facility] PROGRESS NOTE  NAZANIN KINNER ZOX:[id number] DOB: [birth year] DOA: 10/15/2012 PCP: Nadean Corwin, MD  Assessment/Plan: Active Problems:   Chronic diastolic heart failure, NYHA class 1   HCAP (healthcare-associated pneumonia)   Acute and chronic respiratory failure with hypoxia   COPD, severe   Hyponatremia   Hypothyroidism   Diabetes mellitus, type II   Morbid obesity   Protein-calorie malnutrition, severe   Sepsis   Dehydration   Anorexia    1. Acute and chronic respiratory failure with hypoxia due to: Patient has chronic respiratory failure, and is on 3L/min O2 at baseline. She presented with marked SOB, non-productive cough, Oxygen saturation in the 80s, and ABG consistent with acute hypoxic respiratory failure. This was multi-factorial, secondary to COPD exacerbation and HCAP, in a setting of mild pulmonary HTN. For details, see below. Managed as described below.  2. HCAP (healthcare-associated pneumonia)/Sepsis:  Chest CT scan demonstrated extensive right lung pneumonia mainly involving the right upper lobe and right middle lobe, reactive/hyperplastic mediastinal and hilar lymph nodes, as well as a small right pleural effusion. Wcc was elevated at 21.9. She was commenced on iv vancomycin, Cefepime, Levaquin and Metronidazole, as well as supportive treatment, and with clinical improvement, antibiotic therapy was rationalized to Levaquin/Cefepime as of 10/19/12. Clinically, she is feeling much better, is afebrile, respiratory status has improved, and wcc has trended down. Will check CXR.  3. COPD, severe: Patient has known severe COPD/Emphysema, and this was exacerbated by HCAP. Managing with antibiotics, steroids, oxygen supplementation and  Bronchodilators. Exacerbation appears clinically resolved today, and patient is now on oral steroid taper. Will add mycolytics.  4. Chronic diastolic heart failure, NYHA class 1: Patient has no clinical evidence of acute  decompensation. Initially suspected to have volume overload especially with prior I>O and was challenged with Lasix but later suspected edema due to low albumin state (see below).  5. Hypothyroidism: On thyroxine replacement therapy. TSH low at 0.130 but due to euthyroid sick syndrome, since T3/T4 normal. 6. Diabetes mellitus, type II: This appears controlled. Patient denies prior history of DM, so probably steroid-induced. She did have hyperglycemia with steroid therapy, but CBGs have now normalized.  7. Dehydration/Anorexia: Patient had very poor PO intake since admission, but has improved. On nutritional supplements. 8. Fall:  Had fall on 10/19/12, which could have been due to orthostasis. Lasix discontinued on 10/21/12, and IVF continued at 50/hr  9. Hyponatremia: Patient had a very mild hyponatremia of 131 at presentation, likely due to dehydration and HCAP. Now resolved with ivi NS.  10. Deconditioning:  Patient is very deconditioned, due to recent acute illness. PT/OT recommends HHPT vs CIR.  11. Protein-calorie malnutrition, severe: Nutrition consult obtained. Managing as recommended.  12. Rheumatoid arthritis: Stable. Patient was on weekly Methotrexate at home. Last dose 10/21/12.    Code Status: Full Code.  Family Communication:  Disposition Plan: To be determined.    Brief narrative: 62 year old female patient with O2-dependent COPD, rheumatoid arthritis, morbid obesity, hypothyroidism and diabetes. Presented to the emergency department with reports of shortness of breath and coughing. Recently hospitalized for 3 days in early April for COPD exacerbation. She was discharged on doxycycline and a prednisone taper which was completed. She returned to the pulmonology office on 10/06/2012 and at that time her usual Advair was discontinued because she was unable to obtain it, because of her insurance coverage. She was transitioned to budesonide nebulizer twice a day along with Proventil nebulizer.  Lasix was increased to 40 mg daily for  chronic lower extremity edema and chronic diastolic heart failure. On the date of this admission she returned to her primary care physician office because of progressive shortness of breath and worsening nonproductive cough with right-sided pleurisy, chills and posttussive vomiting. In the physician's office her oxygen saturation was 80% on 3 L of oxygen so she was instructed to report to the emergency department, where she was tachycardic with a heart rate in the 120s but was normotensive. On 3 L of nasal cannula oxygen she was saturating at 89%. EKG was unremarkable except for sinus tachycardia. Chest x-ray revealed diffuse bilateral infiltrates with a worsening infiltrate/consolidation in the right upper lobe and right perihilar region consistent with a worsening pneumonia. She had leukocytosis of 22,000. Upon evaluation of the patient admitting physician ordered an ABG was demonstrated a PO2 of 70 on 3 L of oxygen. Admitted for further management.    Consultants:  N/A.  Procedures:  CXR.  Chest CT scan.  MBS.   Antibiotics:  See Notes.   HPI/Subjective: Feels better, although weak, and has mildly yellow phlegm.   Objective: Vital signs in last 24 hours: Temp:  [97.5 F (36.4 C)-99.1 F (37.3 C)] 97.9 F (36.6 C) (05/10 0517) Pulse Rate:  [92-110] 92 (05/10 0517) Resp:  [19-23] 20 (05/10 0517) BP: (116-150)/(63-77) 136/77 mmHg (05/10 0517) SpO2:  [87 %-95 %] 94 % (05/10 0517) Weight change:  Last BM Date: 10/22/12  Intake/Output from previous day: 05/09 0701 - 05/10 0700 In: 930 [P.O.:240; I.V.:490; IV Piggyback:200] Out: 2875 [Urine:2875]     Physical Exam: General: Comfortable, alert, communicative, fully oriented, not short of breath at rest.  HEENT:  Mild clinical pallor, no jaundice, no conjunctival injection or discharge. NECK:  Supple, JVP not seen, no carotid bruits, no palpable lymphadenopathy, no palpable goiter. CHEST:   Clinically clear to auscultation, no wheezes, no crackles. HEART:  Sounds 1 and 2 heard, normal, regular, no murmurs. ABDOMEN:  Full, soft, non-tender, no palpable organomegaly, no palpable masses, normal bowel sounds. GENITALIA:  Not examined. LOWER EXTREMITIES:  Minimal pitting edema, palpable peripheral pulses. MUSCULOSKELETAL SYSTEM:  Generalized osteoarthritic/rheumatoid changes, otherwise, normal. CENTRAL NERVOUS SYSTEM:  No focal neurologic deficit on gross examination.  Lab Results:  Recent Labs  10/21/12 0355 10/23/12 0500  WBC 22.0* 17.2*  HGB 9.8* 9.7*  HCT 29.3* 30.0*  PLT 276 253    Recent Labs  10/22/12 0345 10/23/12 0500  NA 140 140  K 5.1 3.5  CL 108 106  CO2 25 30  GLUCOSE 87 83  BUN 21 18  CREATININE 0.77 0.78  CALCIUM 8.5 8.3*   Recent Results (from the past 240 hour(s))  MRSA PCR SCREENING     Status: None   Collection Time    10/15/12  5:12 PM      Result Value Range Status   MRSA by PCR NEGATIVE  NEGATIVE Final   Comment:            The GeneXpert MRSA Assay (FDA     approved for NASAL specimens     only), is one component of a     comprehensive MRSA colonization     surveillance program. It is not     intended to diagnose MRSA     infection nor to guide or     monitor treatment for     MRSA infections.     Studies/Results: No results found.  Medications: Scheduled Meds: . allopurinol  100 mg Oral Daily  . antiseptic oral  rinse  15 mL Mouth Rinse BID  . benzonatate  100 mg Oral TID  . budesonide  0.25 mg Nebulization BID  . ceFEPIme (MAXIPIME) 2 GM IVP  2 g Intravenous Q8H  . docusate sodium  100 mg Oral BID  . enoxaparin (LOVENOX) injection  40 mg Subcutaneous Q24H  . feeding supplement  1 Container Oral TID BM  . fluticasone  1 spray Each Nare Daily  . folic acid  2 mg Oral Daily  . guaiFENesin  600 mg Oral BID  . ipratropium  0.5 mg Nebulization TID  . levalbuterol  0.63 mg Nebulization TID  . levofloxacin (LEVAQUIN) IV   750 mg Intravenous Q24H  . levothyroxine  50 mcg Oral QAC breakfast  . loratadine  10 mg Oral QHS  . methotrexate  7.5 mg Oral Q Thu  . montelukast  10 mg Oral QHS  . multivitamin with minerals  1 tablet Oral Daily  . nystatin cream   Topical BID  . pantoprazole  40 mg Oral BID  . predniSONE  40 mg Oral Q breakfast   Followed by  . [START ON 10/25/2012] predniSONE  20 mg Oral Q breakfast   Followed by  . [START ON 10/27/2012] predniSONE  10 mg Oral Q breakfast   Continuous Infusions: . sodium chloride 50 mL/hr at 10/21/12 1157   PRN Meds:.acetaminophen, acetaminophen, chlorpheniramine-HYDROcodone, levalbuterol, morphine injection, ondansetron (ZOFRAN) IV, ondansetron, oxyCODONE, polyethylene glycol, sodium chloride    LOS: 8 days   Deborah Dondero,CHRISTOPHER  [facility] Pager [phone]. If 8PM-8AM, please contact night-coverage at [email or web address], password [facility] 10/23/2012, 7:07 AM  LOS: 8 days

## 2012-10-24 DIAGNOSIS — I5032 Chronic diastolic (congestive) heart failure: Secondary | ICD-10-CM

## 2012-10-24 DIAGNOSIS — B370 Candidal stomatitis: Secondary | ICD-10-CM | POA: Diagnosis not present

## 2012-10-24 LAB — CBC
HCT: 29.6 % — ABNORMAL LOW (ref 36.0–46.0)
Hemoglobin: 9.6 g/dL — ABNORMAL LOW (ref 12.0–15.0)
MCV: 91.4 fL (ref 78.0–100.0)
RBC: 3.24 MIL/uL — ABNORMAL LOW (ref 3.87–5.11)
RDW: 16.9 % — ABNORMAL HIGH (ref 11.5–15.5)
WBC: 18.8 10*3/uL — ABNORMAL HIGH (ref 4.0–10.5)

## 2012-10-24 LAB — COMPREHENSIVE METABOLIC PANEL
BUN: 15 mg/dL (ref 6–23)
CO2: 29 mEq/L (ref 19–32)
Chloride: 105 mEq/L (ref 96–112)
Creatinine, Ser: 0.64 mg/dL (ref 0.50–1.10)
GFR calc Af Amer: >90 mL/min (ref >90)
GFR calc non Af Amer: >90 mL/min (ref >90)
Glucose, Bld: 85 mg/dL (ref 70–99)
Total Bilirubin: 0.2 mg/dL — ABNORMAL LOW (ref 0.3–1.2)

## 2012-10-24 LAB — GLUCOSE, CAPILLARY: Glucose-Capillary: 77 mg/dL (ref 70–99)

## 2012-10-24 MED ORDER — FLUCONAZOLE 100 MG PO TABS
100.0000 mg | ORAL_TABLET | ORAL | Status: DC
Start: 2012-10-24 — End: 2012-10-26
  Administered 2012-10-24 – 2012-10-26 (×3): 100 mg via ORAL
  Filled 2012-10-24 (×4): qty 1

## 2012-10-24 MED ORDER — BENZONATATE 100 MG PO CAPS
200.0000 mg | ORAL_CAPSULE | Freq: Three times a day (TID) | ORAL | Status: DC
  Administered 2012-10-24 – 2012-10-26 (×6): 200 mg via ORAL
  Filled 2012-10-24 (×8): qty 2

## 2012-10-24 MED ORDER — POTASSIUM CHLORIDE CRYS ER 20 MEQ PO TBCR
40.0000 meq | EXTENDED_RELEASE_TABLET | Freq: Once | ORAL | Status: AC
  Administered 2012-10-24: 40 meq via ORAL
  Filled 2012-10-24: qty 2

## 2012-10-24 MED ORDER — TIOTROPIUM BROMIDE MONOHYDRATE 18 MCG IN CAPS
1.0000 | ORAL_CAPSULE | Freq: Every day | RESPIRATORY_TRACT | Status: DC
  Administered 2012-10-25 – 2012-10-26 (×2): 18 ug via RESPIRATORY_TRACT
  Filled 2012-10-24: qty 5

## 2012-10-24 MED ORDER — LEVALBUTEROL HCL 0.63 MG/3ML IN NEBU
0.6300 mg | INHALATION_SOLUTION | Freq: Four times a day (QID) | RESPIRATORY_TRACT | Status: DC
  Administered 2012-10-24 – 2012-10-26 (×6): 0.63 mg via RESPIRATORY_TRACT
  Filled 2012-10-24 (×11): qty 3

## 2012-10-24 MED ORDER — LEVALBUTEROL HCL 0.63 MG/3ML IN NEBU
0.6300 mg | INHALATION_SOLUTION | RESPIRATORY_TRACT | Status: DC | PRN
  Administered 2012-10-24: 0.63 mg via RESPIRATORY_TRACT
  Filled 2012-10-24: qty 3

## 2012-10-24 MED ORDER — IPRATROPIUM BROMIDE 0.02 % IN SOLN: 0.5000 mg | Freq: Four times a day (QID) | RESPIRATORY_TRACT | Status: DC

## 2012-10-24 NOTE — Progress Notes (Addendum)
[facility] PROGRESS NOTE  TAYLIE HELDER WJX:[id number] DOB: 1951-02-16 DOA: 10/15/2012 PCP: Nadean Corwin, MD  Assessment/Plan: Active Problems:   Chronic diastolic heart failure, NYHA class 1   HCAP (healthcare-associated pneumonia)   Acute and chronic respiratory failure with hypoxia   COPD, severe   Hyponatremia   Hypothyroidism   Diabetes mellitus, type II   Morbid obesity   Protein-calorie malnutrition, severe   Sepsis   Dehydration   Anorexia    1. Acute and chronic respiratory failure with hypoxia due to: Patient has chronic respiratory failure, and is on 3L/min O2 at baseline. She presented with marked SOB, non-productive cough, Oxygen saturation in the 80s, and ABG consistent with acute hypoxic respiratory failure. This was multi-factorial, secondary to COPD exacerbation and HCAP, in a setting of mild pulmonary HTN. For details, see below. Managed as described below, with significant clinical improvement. Now saturating at 94%-95% on 3L.  2. HCAP (healthcare-associated pneumonia)/Sepsis:  Chest CT scan demonstrated extensive right lung pneumonia mainly involving the right upper lobe and right middle lobe, reactive/hyperplastic mediastinal and hilar lymph nodes, as well as a small right pleural effusion. Wcc was elevated at 21.9. She was commenced on iv vancomycin, Cefepime, Levaquin and Metronidazole, as well as supportive treatment, and with clinical improvement, antibiotic therapy was rationalized to Levaquin (now day# 6)/Cefepime (Now day #10), as of 10/19/12. Clinically, she is feeling much better, is afebrile, respiratory status has improved, and wcc has trended down. CXR of 10/23/12 shows improved right upper lobe aeration. Have discontinued Cefepime today, and will continue Levaquin.  3. COPD, severe: Patient has known severe COPD/Emphysema, and this was exacerbated by HCAP. Managing with antibiotics, steroids, oxygen supplementation and  Bronchodilators.  Exacerbation appears clinically resolved today, and patient is now on oral steroid taper. On mycolytics. Have added Tessalon for cough.  4. Chronic diastolic heart failure, NYHA class 1: Patient has no clinical evidence of acute decompensation. Initially suspected to have volume overload especially with prior I>O and was challenged with Lasix but later suspected edema due to low albumin state (see below).  5. Hypothyroidism: On thyroxine replacement therapy. TSH low at 0.130 but due to euthyroid sick syndrome, since T3/T4 normal. 6. Diabetes mellitus, type II: This appears controlled. Patient denies prior history of DM, so probably steroid-induced. She did have hyperglycemia with steroid therapy, but CBGs have now normalized.  7. Dehydration/Anorexia: Patient had very poor PO intake since admission, but has improved. On nutritional supplements. 8. Fall:  Had fall on 10/19/12, which could have been due to orthostasis. Lasix discontinued on 10/21/12, and IVF continued at 50/hr  9. Hyponatremia: Patient had a very mild hyponatremia of 131 at presentation, likely due to dehydration and HCAP. Now resolved with ivi NS.  10. Deconditioning:  Patient is very deconditioned, due to recent acute illness. PT/OT recommends HHPT vs CIR.  11. Protein-calorie malnutrition, severe: Nutrition consult obtained. Managing as recommended.  12. Rheumatoid arthritis: Stable. Patient was on weekly Methotrexate at home. Last dose 10/21/12.  13. Oral thrush: This was an incidental finding on physical exam 10/24/12. Placed on 7-day course of Diflucan, to be concluded on 10/30/12.   Code Status: Full Code.  Family Communication:  Disposition Plan: To be determined.    Brief narrative: 62 year old female patient with O2-dependent COPD, rheumatoid arthritis, morbid obesity, hypothyroidism and diabetes. Presented to the emergency department with reports of shortness of breath and coughing. Recently hospitalized for 3 days in early April  for COPD exacerbation. She was discharged on doxycycline  and a prednisone taper which was completed. She returned to the pulmonology office on 10/06/2012 and at that time her usual Advair was discontinued because she was unable to obtain it, because of her insurance coverage. She was transitioned to budesonide nebulizer twice a day along with Proventil nebulizer. Lasix was increased to 40 mg daily for chronic lower extremity edema and chronic diastolic heart failure. On the date of this admission she returned to her primary care physician office because of progressive shortness of breath and worsening nonproductive cough with right-sided pleurisy, chills and posttussive vomiting. In the physician's office her oxygen saturation was 80% on 3 L of oxygen so she was instructed to report to the emergency department, where she was tachycardic with a heart rate in the 120s but was normotensive. On 3 L of nasal cannula oxygen she was saturating at 89%. EKG was unremarkable except for sinus tachycardia. Chest x-ray revealed diffuse bilateral infiltrates with a worsening infiltrate/consolidation in the right upper lobe and right perihilar region consistent with a worsening pneumonia. She had leukocytosis of 22,000. Upon evaluation of the patient admitting physician ordered an ABG was demonstrated a PO2 of 70 on 3 L of oxygen. Admitted for further management.    Consultants:  N/A.  Procedures:  CXR.  Chest CT scan.  MBS.   Antibiotics:  See Notes.   HPI/Subjective: Feels better, has frequent cough.   Objective: Vital signs in last 24 hours: Temp:  [98 F (36.7 C)-98.4 F (36.9 C)] 98.4 F (36.9 C) (05/11 0626) Pulse Rate:  [96-111] 102 (05/11 0626) Resp:  [18-21] 18 (05/11 0626) BP: (137-154)/(59-68) 154/68 mmHg (05/11 0626) SpO2:  [93 %-95 %] 95 % (05/11 0808) Weight:  [100.29 kg (221 lb 1.6 oz)] 100.29 kg (221 lb 1.6 oz) (05/11 0626) Weight change:  Last BM Date: 10/22/12  Intake/Output  from previous day: 05/10 0701 - 05/11 0700 In: -  Out: 3025 [Urine:3025]     Physical Exam: General: Comfortable, alert, communicative, fully oriented, not short of breath at rest.  HEENT:  Mild clinical pallor, no jaundice, no conjunctival injection or discharge. Has oral thrush.  NECK:  Supple, JVP not seen, no carotid bruits, no palpable lymphadenopathy, no palpable goiter. CHEST:  Bilateral expiratory wheeze, no crackles. HEART:  Sounds 1 and 2 heard, normal, regular, no murmurs. ABDOMEN:  Full, soft, non-tender, no palpable organomegaly, no palpable masses, normal bowel sounds. GENITALIA:  Not examined. LOWER EXTREMITIES:  Minimal pitting edema, palpable peripheral pulses. MUSCULOSKELETAL SYSTEM:  Generalized osteoarthritic/rheumatoid changes, otherwise, normal. CENTRAL NERVOUS SYSTEM:  No focal neurologic deficit on gross examination.  Lab Results:  Recent Labs  10/23/12 0500 10/24/12 0600  WBC 17.2* 18.8*  HGB 9.7* 9.6*  HCT 30.0* 29.6*  PLT 253 257    Recent Labs  10/23/12 0500 10/24/12 0600  NA 140 140  K 3.5 2.9*  CL 106 105  CO2 30 29  GLUCOSE 83 85  BUN 18 15  CREATININE 0.78 0.64  CALCIUM 8.3* 8.5   Recent Results (from the past 240 hour(s))  MRSA PCR SCREENING     Status: None   Collection Time    10/15/12  5:12 PM      Result Value Range Status   MRSA by PCR NEGATIVE  NEGATIVE Final   Comment:            The GeneXpert MRSA Assay (FDA     approved for NASAL specimens     only), is one component of a  comprehensive MRSA colonization     surveillance program. It is not     intended to diagnose MRSA     infection nor to guide or     monitor treatment for     MRSA infections.     Studies/Results: Dg Chest 2 View  10/23/2012  *RADIOLOGY REPORT*  Clinical Data: Shortness of breath and cough  CHEST - 2 VIEW  Comparison: 10/18/2012  Findings: Dense right upper lobe airspace disease has improved slightly.  Chronic pulmonary parenchymal changes  elsewhere are again noted with cystic bullous change of underlying emphysema. Heart size is normal.  No pleural effusion.  No acute osseous finding.  IMPRESSION: Improved right upper lobe aeration.  Radiographic resolution of pneumonia may take 4-6 weeks radiographically, with longer time intervals often seen in patients with underlying lung disease such as emphysema.   Original Report Authenticated By: Christiana Pellant, M.D.     Medications: Scheduled Meds: . allopurinol  100 mg Oral Daily  . antiseptic oral rinse  15 mL Mouth Rinse BID  . benzonatate  100 mg Oral TID  . budesonide  0.25 mg Nebulization BID  . ceFEPIme (MAXIPIME) 2 GM IVP  2 g Intravenous Q8H  . docusate sodium  100 mg Oral BID  . enoxaparin (LOVENOX) injection  40 mg Subcutaneous Q24H  . feeding supplement  1 Container Oral TID BM  . fluticasone  1 spray Each Nare Daily  . folic acid  2 mg Oral Daily  . guaiFENesin  600 mg Oral BID  . ipratropium  0.5 mg Nebulization TID  . levalbuterol  0.63 mg Nebulization TID  . levofloxacin  750 mg Oral Q24H  . levothyroxine  50 mcg Oral QAC breakfast  . loratadine  10 mg Oral QHS  . methotrexate  7.5 mg Oral Q Thu  . montelukast  10 mg Oral QHS  . multivitamin with minerals  1 tablet Oral Daily  . nystatin cream   Topical BID  . pantoprazole  40 mg Oral BID  . potassium chloride  40 mEq Oral Once  . [START ON 10/25/2012] predniSONE  20 mg Oral Q breakfast   Followed by  . [START ON 10/27/2012] predniSONE  10 mg Oral Q breakfast   Continuous Infusions: . sodium chloride 50 mL/hr at 10/23/12 2126   PRN Meds:.acetaminophen, acetaminophen, chlorpheniramine-HYDROcodone, levalbuterol, morphine injection, ondansetron (ZOFRAN) IV, ondansetron, oxyCODONE, polyethylene glycol, sodium chloride    LOS: 9 days   Sierrah Luevano,CHRISTOPHER  [facility] Pager [phone]. If 8PM-8AM, please contact night-coverage at [email or web address], password [facility] 10/24/2012, 12:25 PM  LOS: 9 days

## 2012-10-25 LAB — GLUCOSE, CAPILLARY

## 2012-10-25 LAB — BASIC METABOLIC PANEL
Calcium: 8.6 mg/dL (ref 8.4–10.5)
GFR calc Af Amer: >90 mL/min (ref >90)
GFR calc non Af Amer: >90 mL/min (ref >90)
Potassium: 3.3 mEq/L — ABNORMAL LOW (ref 3.5–5.1)
Sodium: 139 mEq/L (ref 135–145)

## 2012-10-25 LAB — CBC
MCH: 29.4 pg (ref 26.0–34.0)
MCHC: 32.2 g/dL (ref 30.0–36.0)
RDW: 16.7 % — ABNORMAL HIGH (ref 11.5–15.5)

## 2012-10-25 MED ORDER — POTASSIUM CHLORIDE CRYS ER 20 MEQ PO TBCR
40.0000 meq | EXTENDED_RELEASE_TABLET | Freq: Every day | ORAL | Status: DC
  Administered 2012-10-25 – 2012-10-26 (×2): 40 meq via ORAL
  Filled 2012-10-25 (×2): qty 2

## 2012-10-25 NOTE — Discharge Summary (Signed)
Physician Discharge Summary  Jacqueline Washington ZOX:[id number] DOB: 06/17/1950 DOA: 10/15/2012  PCP: Nadean Corwin, MD  Admit date: 10/15/2012 Discharge date: 10/26/2012  Time spent: 40 minutes  Recommendations for Outpatient Follow-up:  1. Follow up with PMD. 2. Follow up with SNF MD.   Discharge Diagnoses:  Active Problems:   Chronic diastolic heart failure, NYHA class 1   HCAP (healthcare-associated pneumonia)   Acute and chronic respiratory failure with hypoxia   COPD, severe   Hyponatremia   Hypothyroidism   Diabetes mellitus, type II   Morbid obesity   Protein-calorie malnutrition, severe   Sepsis   Dehydration   Anorexia   Oral thrush   Discharge Condition: Satisfactory.   Diet recommendation: Carbohydrate-modified.   Filed Weights   10/24/12 0626 10/25/12 0512 10/26/12 0500  Weight: 100.29 kg (221 lb 1.6 oz) 101.4 kg (223 lb 8.7 oz) 100.8 kg (222 lb 3.6 oz)    History of present illness:  62 year old female patient with O2-dependent COPD, rheumatoid arthritis, morbid obesity, hypothyroidism and diabetes. Presented to the emergency department with reports of shortness of breath and coughing. Recently hospitalized for 3 days in early April for COPD exacerbation. She was discharged on doxycycline and a prednisone taper which was completed. She returned to the pulmonology office on 10/06/2012 and at that time her usual Advair was discontinued because she was unable to obtain it, because of her insurance coverage. She was transitioned to budesonide nebulizer twice a day along with Proventil nebulizer. Lasix was increased to 40 mg daily for chronic lower extremity edema and chronic diastolic heart failure. On the date of this admission she returned to her primary care physician office because of progressive shortness of breath and worsening nonproductive cough with right-sided pleurisy, chills and posttussive vomiting. In the physician's office her oxygen saturation was 80%  on 3 L of oxygen so she was instructed to report to the emergency department, where she was tachycardic with a heart rate in the 120s but was normotensive. On 3 L of nasal cannula oxygen she was saturating at 89%. EKG was unremarkable except for sinus tachycardia. Chest x-ray revealed diffuse bilateral infiltrates with a worsening infiltrate/consolidation in the right upper lobe and right perihilar region consistent with a worsening pneumonia. She had leukocytosis of 22,000. Upon evaluation of the patient admitting physician ordered an ABG was demonstrated a PO2 of 70 on 3 L of oxygen. Admitted for further management.    Hospital Course:  1. Acute and chronic respiratory failure with hypoxia: Patient has chronic respiratory failure, and is on 3L/min O2 at baseline. She presented with marked SOB, non-productive cough, Oxygen saturation in the 80s, and ABG consistent with acute hypoxic respiratory failure. This was multi-factorial, secondary to COPD exacerbation and HCAP, in a setting of mild pulmonary HTN. For details, see below. Managed as described below, with significant clinical improvement. Now saturating at 94%-97% on 3L.  2. HCAP (healthcare-associated pneumonia)/Sepsis: Chest CT scan demonstrated extensive right lung pneumonia mainly involving the right upper lobe and right middle lobe, reactive/hyperplastic mediastinal and hilar lymph nodes, as well as a small right pleural effusion. Wcc was elevated at 21.9. She was commenced on iv Vancomycin, Cefepime, Levaquin and Metronidazole, as well as supportive treatment, with satisfactory clinical response. With clinical improvement, antibiotic therapy was rationalized to Levaquin/Cefepime, as of 10/19/12. Patient remained afebrile, respiratory status improved, and wcc trended down. CXR of 10/23/12 showed improved right upper lobe aeration. Cefepime was discontinued on 10/24/12 and Levaquin monotheray, will be completed on 09/27/12,  to conclude 14 days of  antibiotic therapy.  3. COPD, severe: Patient has known severe COPD/Emphysema, and this was exacerbated by HCAP. Managed with antibiotics, steroids, oxygen supplementation and bronchodilators. As of 10/23/12, exacerbation had clinically resolved, and patient is now on oral steroid taper, mucolytics and Tessalon.  4. Chronic diastolic heart failure, NYHA class 1: Patient had no clinical evidence of acute decompensation, during her hospitalization.She was initially suspected to have volume overload especially with prior I>O and was challenged with Lasix, but later felt to have edema due to low albumin state (see below).  5. Hypothyroidism: On thyroxine replacement therapy. TSH low at 0.130 but due to euthyroid sick syndrome, since T3/T4 normal.  6. Diabetes mellitus, type II: This appears controlled. Patient denies prior history of DM, so probably steroid-induced. She did have hyperglycemia with steroid therapy, but CBGs have since normalized.  7. Dehydration/Anorexia: Patient had very poor PO intake in the initial few days of admission, but has improved. On nutritional supplements.  8. Fall: Had fall on 10/19/12, which could have been due to orthostasis. Lasix was discontinued on 10/21/12, IVF continued at 50/hr, then as there was no recurrence, this was discontinued without deleterious effect.  9. Hyponatremia: Patient had a very mild hyponatremia of 131 at presentation, likely due to dehydration and HCAP. Now resolved with ivi NS.  10. Deconditioning: Patient is very deconditioned, due to recent acute illness. PT/OT evaluated, and recommended ST-SNF.  11. Protein-calorie malnutrition, severe: Nutrition consult obtained. Managed as recommended.  12. Rheumatoid arthritis: Stable. Patient was on weekly Methotrexate at home. Last dose 10/21/12. This will be continued on discharge. 13. Oral thrush: This was an incidental finding on physical exam 10/24/12. Treated with a 7-day course of Diflucan, to be concluded on  10/30/12.   Procedures:  See Below.   Consultations:  N/A.   Discharge Exam: Filed Vitals:   10/25/12 2121 10/26/12 0500 10/26/12 0610 10/26/12 0738  BP: 163/77  149/63   Pulse: 107  99   Temp: 98.2 F (36.8 C)  98.4 F (36.9 C)   TempSrc: Oral  Oral   Resp: 18  18   Height:      Weight:  100.8 kg (222 lb 3.6 oz)    SpO2: 97%  97% 98%    General: Comfortable, alert, communicative, fully oriented, not short of breath at rest.  HEENT: Mild clinical pallor, no jaundice, no conjunctival injection or discharge. Has oral thrush.  NECK: Supple, JVP not seen, no carotid bruits, no palpable lymphadenopathy, no palpable goiter.  CHEST: Few faint wheezes, no crackles.  HEART: Sounds 1 and 2 heard, normal, regular, no murmurs.  ABDOMEN: Full, soft, non-tender, no palpable organomegaly, no palpable masses, normal bowel sounds.  GENITALIA: Not examined.  LOWER EXTREMITIES: Minimal pitting edema, palpable peripheral pulses.  MUSCULOSKELETAL SYSTEM: Generalized osteoarthritic/rheumatoid changes, otherwise, normal.  CENTRAL NERVOUS SYSTEM: No focal neurologic deficit on gross examination.  Discharge Instructions      Discharge Orders   Future Appointments Provider Department Dept Phone   11/10/2012 3:45 PM Storm Frisk, MD [facility] [phone]   Future Orders Complete By Expires     Diet Carb Modified  As directed     Increase activity slowly  As directed         Medication List    STOP taking these medications       albuterol (2.5 MG/3ML) 0.083% nebulizer solution  Commonly known as:  PROVENTIL      TAKE these medications  allopurinol 100 MG tablet  Commonly known as:  ZYLOPRIM  Take 100 mg by mouth daily.     arformoterol 15 MCG/2ML Nebu  Commonly known as:  BROVANA  Take 2 mLs (15 mcg total) by nebulization 2 (two) times daily. Dx: 491.20     benzonatate 200 MG capsule  Commonly known as:  TESSALON  Take 1 capsule (200 mg total) by mouth 3  (three) times daily.     budesonide 0.25 MG/2ML nebulizer solution  Commonly known as:  PULMICORT  Take 2 mLs (0.25 mg total) by nebulization 2 (two) times daily. Dx: 491.20     cetirizine 10 MG tablet  Commonly known as:  ZYRTEC  Take 1 tablet (10 mg total) by mouth daily.     CYANOCOBALAMIN PO  Take 1 tablet by mouth every Monday, Wednesday, and Friday.     DSS 100 MG Caps  Take 100 mg by mouth 2 (two) times daily.     feeding supplement Liqd  Take 1 Container by mouth 3 (three) times daily between meals.     fluconazole 100 MG tablet  Commonly known as:  DIFLUCAN  Take 1 tablet (100 mg total) by mouth daily.     fluticasone 50 MCG/ACT nasal spray  Commonly known as:  FLONASE  Place 1 spray into both nostrils daily as needed for allergies.     folic acid 1 MG tablet  Commonly known as:  FOLVITE  Take 2 mg by mouth daily.     furosemide 40 MG tablet  Commonly known as:  LASIX  Take 40 mg by mouth daily.     guaiFENesin 600 MG 12 hr tablet  Commonly known as:  MUCINEX  Take 1 tablet (600 mg total) by mouth 2 (two) times daily.     levalbuterol 0.63 MG/3ML nebulizer solution  Commonly known as:  XOPENEX  Take 3 mLs (0.63 mg total) by nebulization every 4 (four) hours as needed for wheezing or shortness of breath.     levalbuterol 0.63 MG/3ML nebulizer solution  Commonly known as:  XOPENEX  Take 3 mLs (0.63 mg total) by nebulization every 6 (six) hours.     levofloxacin 750 MG tablet  Commonly known as:  LEVAQUIN  Take 1 tablet (750 mg total) by mouth daily.     levothyroxine 50 MCG tablet  Commonly known as:  SYNTHROID, LEVOTHROID  Take 50 mcg by mouth daily before breakfast.     MAGNESIUM-ZINC PO  Take 2 tablets by mouth daily.     methotrexate 2.5 MG tablet  Commonly known as:  RHEUMATREX  Take 3 tablets (7.5 mg total) by mouth once a week. Thursdays     montelukast 10 MG tablet  Commonly known as:  SINGULAIR  Take 1 tablet by mouth daily.      multivitamin capsule  Take 1 capsule by mouth daily.     nystatin cream  Commonly known as:  MYCOSTATIN  Apply 1 application topically daily as needed (under breasts).     oxyCODONE 5 MG immediate release tablet  Commonly known as:  Oxy IR/ROXICODONE  Take 1 tablet (5 mg total) by mouth every 4 (four) hours as needed.     pantoprazole 40 MG tablet  Commonly known as:  PROTONIX  Take 1 tablet (40 mg total) by mouth 2 (two) times daily.     potassium chloride SA 20 MEQ tablet  Commonly known as:  K-DUR,KLOR-CON  Take 2 tablets (40 mEq total) by mouth daily.  predniSONE 10 MG tablet  Commonly known as:  DELTASONE  Take 1 tablet (10 mg total) by mouth daily with breakfast.  Start taking on:  10/27/2012     sodium chloride 0.65 % nasal spray  Commonly known as:  OCEAN  Place 1 spray into both nostrils daily as needed for congestion.     STUDY MEDICATION  Take 150 mg by mouth daily. Vertrex 104 Study for rheumatoid arthritis     tiotropium 18 MCG inhalation capsule  Commonly known as:  SPIRIVA HANDIHALER  Place 1 capsule (18 mcg total) into inhaler and inhale daily.     vitamin C 1000 MG tablet  Take 1,000 mg by mouth daily.     Vitamin D-3 5000 UNITS Tabs  Take 5,000 Units by mouth daily.       Allergies  Allergen Reactions  . Penicillins Rash  . Sulfonamide Derivatives Rash  . Tetanus Toxoid Other (See Comments)    Knot on arm   Follow-up Information   Schedule an appointment as soon as possible for a visit with Nadean Corwin, MD.   Contact information:   [address] [place] [address] [phone]       Please follow up. (Follow up with SNF MD. )        The results of significant diagnostics from this hospitalization (including imaging, microbiology, ancillary and laboratory) are listed below for reference.    Significant Diagnostic Studies: Dg Chest 2 View  10/23/2012  *RADIOLOGY REPORT*  Clinical Data: Shortness of breath  and cough  CHEST - 2 VIEW  Comparison: 10/18/2012  Findings: Dense right upper lobe airspace disease has improved slightly.  Chronic pulmonary parenchymal changes elsewhere are again noted with cystic bullous change of underlying emphysema. Heart size is normal.  No pleural effusion.  No acute osseous finding.  IMPRESSION: Improved right upper lobe aeration.  Radiographic resolution of pneumonia may take 4-6 weeks radiographically, with longer time intervals often seen in patients with underlying lung disease such as emphysema.   Original Report Authenticated By: Christiana Pellant, M.D.    Dg Chest 2 View  10/15/2012  *RADIOLOGY REPORT*  Clinical Data: Pneumonia, dehydration  CHEST - 2 VIEW  Comparison: 10/06/2012  Findings: Cardiomediastinal silhouette is stable.  Diffuse bilateral infiltrates are again noted.  Worsening infiltrate/consolidation in the right upper lobe and right perihilar region.  Follow-up to resolution after treatment is recommended.  IMPRESSION: Diffuse bilateral infiltrates again noted.  Worsening infiltrates/consolidation in the right upper lobe and right perihilar region consistent with worsening pneumonia.  Follow-up to resolution after treatment is recommended.   Original Report Authenticated By: Natasha Mead, M.D.    Dg Chest 2 View  10/06/2012  *RADIOLOGY REPORT*  Clinical Data: Wheezing.  Cough and shortness of breath and chest pain.  COPD.  CHEST - 2 VIEW  Comparison: 09/20/2012, 09/14/2012 and 01/12/2012  Findings: The patient has developed extensive new infiltrates since 09/20/2012 in the upper lobes bilaterally, most prominently seen on the lateral view.  The heart size and vascularity are normal.  Markings are slightly increased the lung bases as well as compared the prior study.  No effusions.  IMPRESSION: New bilateral upper lobe infiltrates superimposed on chronic lung disease.   Original Report Authenticated By: Francene Boyers, M.D.    Ct Chest W Contrast  10/19/2012  *RADIOLOGY  REPORT*  Clinical Data: Worsening pneumonia.  CT CHEST WITH CONTRAST  Technique:  Multidetector CT imaging of the chest was performed following the standard protocol during bolus  administration of intravenous contrast.  Contrast:  100 ml Omnipaque-300  Comparison: CT scan 06/27/2011.  Findings: The chest wall is unremarkable.  No breast mass, supraclavicular or axillary lymphadenopathy.  The bony thorax is intact.  No destructive bone lesions or spinal canal compromise.  The heart is normal in size.  No pericardial effusion.  There are scattered borderline mediastinal and right hilar lymph nodes which are likely inflammatory/hyperplastic.  The aorta is normal in caliber.  No dissection.  The esophagus is grossly normal. Residual contrast noted in the esophagus.  There is dense airspace consolidation in the right upper lobe with underlying emphysematous changes.  Findings most consistent with lumbar pneumonia.  No obvious mass.  Air bronchograms noted throughout.  No definite abscess.  There is also infiltrate in the right middle lobe and patchy infiltrate in the right lower lobe. Significant underlying emphysema and areas of pulmonary fibrosis. The very small right pleural effusion is noted.  The upper abdomen is grossly normal.  IMPRESSION:  1.  Extensive right lung pneumonia mainly involving the right upper lobe and right middle lobe. 2.  Reactive/hyperplastic mediastinal and hilar lymph nodes. 3.  Small right pleural effusion.   Original Report Authenticated By: Rudie Meyer, M.D.    Dg Chest Port 1 View  10/18/2012  *RADIOLOGY REPORT*  Clinical Data: Pneumonia.  Pulmonary edema.  Cough.  PORTABLE CHEST - 1 VIEW  Comparison: 10/15/2012.  Findings: Compared to the prior exam, the right lung diffuse airspace disease with focal right upper lobe consolidation has worsened.  This may represent hydration affects of pneumonia. Chronic-appearing interstitial changes of the left chest appears similar to the prior exam.  Monitoring leads are projected over the chest. No pneumothorax.  Cardiomediastinal contours appear little changed.  IMPRESSION: Worsening right lung airspace disease with dense consolidation in the right upper lobe.  Findings are most compatible with pneumonia. Worsening density is probably due to hydration affects.   Original Report Authenticated By: Andreas Newport, M.D.    Dg Swallowing Func-speech Pathology  10/19/2012  Breck [place], CCC-SLP     10/19/2012  3:08 PM Objective Swallowing Evaluation: Modified Barium Swallowing Study   Patient Details  Name: Jacqueline Washington MRN: [id number] Date of Birth: 05-31-1951  Today's Date: 10/19/2012 Time: 1329-1340 SLP Time Calculation (min): 11 min  Past Medical History:  Past Medical History  Diagnosis Date  . Asthma   . Rheumatoid arthritis   . Emphysema     02 dependent 3L/min  . Emphysema   . Pneumonia   . Hypothyroidism   . Diabetes mellitus, type II   . Obesity   . Diastolic heart failure 09/22/2012    Grade 1   Past Surgical History:  Past Surgical History  Procedure Laterality Date  . Eye surgery  at age 30  . Tubal ligation    . Colonoscopy     HPI:  62 year old female admitted with HCAP. High suspicion for  aspiration PNA per MD notes as children mention she coughs  frequently during meals in addition to her chronic appearing  cough. PMH of severe O2 dependent COPD. Her chest x-ray reveals  diffuse bilateral infiltrates, worsening infiltrate/consolidation  in the right upper lobe and right perihilar region consistent  with worsening pneumonia.   MBS recommended     Assessment / Plan / Recommendation Clinical Impression  Dysphagia Diagnosis: Mild pharyngeal phase dysphagia Clinical impression: Pt.'s head of bed/chair was raised and she  became nauseated with increased SOB.  SLP allowed  pt. 5 minutes  in which her nausea subsided and respiration rate decreased  slightly.  She demonstrated mild pharyngeal dysphagia with  laryngeal penetration likely due to increased  respiratory rate  and decreased coordination of swallow and respiration.  Thin  liquids were penetration (flash with straw x 1, flash with cup x1  and minimal remained on anterior wall of vestibule).  Swallow  initiation was timely without significant residue (one instance  of lingual residue falling to pyriform sinuses post swallow.   Brief esophageal revealed possible min-mild stasis (no  radiologist present to confirm).  Recommend regular diet texture  and thin liquids.  SLP stressed the importance of not  eating/drinking when she is very SOB, taking small sips and bites  and no straws, pills whole in applesauce.  SLP will continue to  folllow.    Treatment Recommendation  Therapy as outlined in treatment plan below    Diet Recommendation Regular;Thin liquid   Liquid Administration via: Cup;No straw Medication Administration: Whole meds with puree Supervision: Patient able to self feed;Intermittent supervision  to cue for compensatory strategies Compensations: Slow rate;Small sips/bites Postural Changes and/or Swallow Maneuvers: Seated upright 90  degrees;Upright 30-60 min after meal    Other  Recommendations Oral Care Recommendations: Oral care BID   Follow Up Recommendations   (tba)    Frequency and Duration min 2x/week  1 week   Pertinent Vitals/Pain     SLP Swallow Goals Patient will utilize recommended strategies during swallow to  increase swallowing safety with: Supervision/safety      Reason for Referral Objectively evaluate swallowing function   Oral Phase Oral Preparation/Oral Phase Oral Phase: WFL   Pharyngeal Phase Pharyngeal Phase Pharyngeal Phase: Impaired Pharyngeal - Thin Pharyngeal - Thin Cup: Penetration/Aspiration during swallow Penetration/Aspiration details (thin cup): Material enters  airway, remains ABOVE vocal cords then ejected out;Material  enters airway, remains ABOVE vocal cords and not ejected out Pharyngeal - Thin Straw: Penetration/Aspiration during  swallow;Pharyngeal residue -  valleculae (mild lingual residue  spill into vallecuale) Penetration/Aspiration details (thin straw): Material enters  airway, remains ABOVE vocal cords then ejected out Pharyngeal - Solids Pharyngeal - Regular: Within functional limits  Cervical Esophageal Phase        Cervical Esophageal Phase Cervical Esophageal Phase: [facility]         Darrow Bussing.Ed CCC-SLP Pager [phone]  10/19/2012     Microbiology: No results found for this or any previous visit (from the past 240 hour(s)).   Labs: Basic Metabolic Panel:  Recent Labs Lab 10/22/12 0345 10/23/12 0500 10/24/12 0600 10/25/12 0633 10/26/12 0450  NA 140 140 140 139 138  K 5.1 3.5 2.9* 3.3* 4.1  CL 108 106 105 104 103  CO2 25 30 29 31 26   GLUCOSE 87 83 85 89 81  BUN 21 18 15 14 15   CREATININE 0.77 0.78 0.64 0.68 0.67  CALCIUM 8.5 8.3* 8.5 8.6 8.7  MG  --   --   --   --  2.1   Liver Function Tests:  Recent Labs Lab 10/24/12 0600  AST 28  ALT 14  ALKPHOS 78  BILITOT 0.2*  PROT 5.8*  ALBUMIN 1.7*   No results found for this basename: LIPASE, AMYLASE,  in the last 168 hours No results found for this basename: AMMONIA,  in the last 168 hours CBC:  Recent Labs Lab 10/21/12 0355 10/23/12 0500 10/24/12 0600 10/25/12 0633 10/26/12 0450  WBC 22.0* 17.2* 18.8* 17.9* 16.1*  HGB 9.8*  9.7* 9.6* 9.8* 10.3*  HCT 29.3* 30.0* 29.6* 30.4* 32.3*  MCV 88.5 90.9 91.4 91.3 92.3  PLT 276 253 257 257 235   Cardiac Enzymes: No results found for this basename: CKTOTAL, CKMB, CKMBINDEX, TROPONINI,  in the last 168 hours BNP: BNP (last 3 results)  Recent Labs  09/20/12 1316 10/15/12 1200  PROBNP 333.7* 327.5*   CBG:  Recent Labs Lab 10/23/12 2144 10/24/12 0741 10/25/12 0756 10/25/12 1936 10/26/12 0759  GLUCAP 158* 77 88 136* 102*       Signed:  Dhwani Venkatesh,CHRISTOPHER  [facility] 10/26/2012, 1:07 PM

## 2012-10-25 NOTE — Progress Notes (Addendum)
Occupational Therapy Treatment Patient Details Name: Jacqueline Washington MRN: [id number] DOB: 03/08/1951 Today's Date: 10/25/2012 Time: 0272-5366 OT Time Calculation (min): 35 min  OT Assessment / Plan / Recommendation Comments on Treatment Session Pt progressing towards goals. HR 126 with activity and O2 dropped to 86 with activity on 3 L of O2. Performed pursed lip breathing and O2 trended up and HR trended down. Educated pt on energy conservation techniques and gave handout to her.    Follow Up Recommendations  SNF    Barriers to Discharge       Equipment Recommendations  3 in 1 bedside comode    Recommendations for Other Services    Frequency Min 2X/week   Plan Discharge plan needs to be updated    Precautions / Restrictions Precautions Precautions: Fall Restrictions Weight Bearing Restrictions: No   Pertinent Vitals/Pain No pain reported. HR 126 with activity and O2 dropped to 86 with activity on 3 L of O2. Performed pursed lip breathing and O2 trended up and HR trended down.      ADL  Grooming: Wash/dry hands;Minimal assistance Where Assessed - Grooming: Supported standing Toilet Transfer: Hydrographic surveyor Method: Sit to Barista: Raised toilet seat with arms (or 3-in-1 over toilet) Toileting - Clothing Manipulation and Hygiene: Maximal assistance Where Assessed - Engineer, mining and Hygiene: Standing Equipment Used: Gait belt;Rolling walker Transfers/Ambulation Related to ADLs: Minguard for ambulation and transfers ADL Comments: Pt able to ambulate to bathroom and perform toilet transfer. Pt at Max A level for hygiene. Pt able to move gown before transfer. Pt able to wipe back to front but unable to clean self thoroughly from BM due to not being able to reach. Educated on using wipes and toilet tongs.  Min A for grooming at sink due to several vc's for pursed lip breathing and to pace self.     OT Diagnosis:    OT Problem  List:   OT Treatment Interventions:     OT Goals Acute Rehab OT Goals OT Goal Formulation: With patient/family Time For Goal Achievement: 11/04/12 Potential to Achieve Goals: Good ADL Goals Pt Will Perform Grooming: with min assist;Standing at sink ADL Goal: Grooming - Progress: Met Pt Will Perform Upper Body Bathing: with min assist;Sitting, chair;Sitting, edge of bed Pt Will Perform Lower Body Bathing: with mod assist;Sit to stand from chair;Sit to stand from bed Pt Will Perform Upper Body Dressing: with min assist;Sitting, chair;Sitting, bed Pt Will Perform Lower Body Dressing: with mod assist;Sit to stand from chair;Sit to stand from bed Pt Will Transfer to Toilet: with min assist;Ambulation;Comfort height toilet ADL Goal: Toilet Transfer - Progress: Met Pt Will Perform Toileting - Clothing Manipulation: with min assist;Standing ADL Goal: Toileting - Clothing Manipulation - Progress: Progressing toward goals Additional ADL Goal #1: Pt will incorporate energy conservation techniques into daily activities with min cues ADL Goal: Additional Goal #1 - Progress: Progressing toward goals Additional ADL Goal #2: Pt will participate in 20 mins therapeutic activity with DOE no greater than 3/4 and no more than 2 rest breaks ADL Goal: Additional Goal #2 - Progress: Progressing toward goals  Visit Information  Last OT Received On: 10/25/12 Assistance Needed: +1    Subjective Data      Prior Functioning       Cognition  Cognition Arousal/Alertness: Awake/alert Behavior During Therapy: WFL for tasks assessed/performed Overall Cognitive Status: Within Functional Limits for tasks assessed    Mobility  Bed Mobility Bed Mobility: Not assessed  Transfers Transfers: Sit to Stand;Stand to Sit Sit to Stand: 4: Min guard;With upper extremity assist;From chair/3-in-1 Stand to Sit: 4: Min guard;With upper extremity assist;To chair/3-in-1 Details for Transfer Assistance: minguard for safety      Exercises  Other Exercises Other Exercises: Pt performed 10 reps of shoulder flexion. No rest breaks needed.   Balance     End of Session OT - End of Session Equipment Utilized During Treatment: Gait belt Activity Tolerance: Patient limited by fatigue Patient left: in chair;with call bell/phone within reach;with family/visitor present  GO     Earlie Raveling OTR/L [phone] 10/25/2012, 4:25 PM

## 2012-10-25 NOTE — Progress Notes (Signed)
[facility] PROGRESS NOTE  Jacqueline Washington AVW:[id number] DOB: [birth year] DOA: 10/15/2012 PCP: Nadean Corwin, MD  Assessment/Plan: Active Problems:   Chronic diastolic heart failure, NYHA class 1   HCAP (healthcare-associated pneumonia)   Acute and chronic respiratory failure with hypoxia   COPD, severe   Hyponatremia   Hypothyroidism   Diabetes mellitus, type II   Morbid obesity   Protein-calorie malnutrition, severe   Sepsis   Dehydration   Anorexia   Oral thrush    1. Acute and chronic respiratory failure with hypoxia due to: Patient has chronic respiratory failure, and is on 3L/min O2 at baseline. She presented with marked SOB, non-productive cough, Oxygen saturation in the 80s, and ABG consistent with acute hypoxic respiratory failure. This was multi-factorial, secondary to COPD exacerbation and HCAP, in a setting of mild pulmonary HTN. For details, see below. Managed as described below, with significant clinical improvement. Now saturating at 94%-97% on 3L.  2. HCAP (healthcare-associated pneumonia)/Sepsis:  Chest CT scan demonstrated extensive right lung pneumonia mainly involving the right upper lobe and right middle lobe, reactive/hyperplastic mediastinal and hilar lymph nodes, as well as a small right pleural effusion. Wcc was elevated at 21.9. She was commenced on iv vancomycin, Cefepime, Levaquin and Metronidazole, as well as supportive treatment, and with clinical improvement, antibiotic therapy was rationalized to Levaquin (now day# 6)/Cefepime (Now day #10), as of 10/19/12. Clinically, she is feeling much better, is afebrile, respiratory status has improved, and wcc has trended down. CXR of 10/23/12 shows improved right upper lobe aeration. Have discontinued Cefepime on 10/24/12. Levaquin continues. Now day #12 antibiotics. Antibiotics will likely be discontinued on 10/27/12.  3. COPD, severe: Patient has known severe COPD/Emphysema, and this was exacerbated by HCAP.  Managing with antibiotics, steroids, oxygen supplementation and  Bronchodilators. Exacerbation appears clinically resolved as of 10/23/12, and patient is now on oral steroid taper. On mucolytics/Tessalon.  4. Chronic diastolic heart failure, NYHA class 1: Patient has no clinical evidence of acute decompensation. Initially suspected to have volume overload especially with prior I>O and was challenged with Lasix but later suspected edema due to low albumin state (see below).  5. Hypothyroidism: On thyroxine replacement therapy. TSH low at 0.130 but due to euthyroid sick syndrome, since T3/T4 normal. 6. Diabetes mellitus, type II: This appears controlled. Patient denies prior history of DM, so probably steroid-induced. She did have hyperglycemia with steroid therapy, but CBGs have now normalized.  7. Dehydration/Anorexia: Patient had very poor PO intake since admission, but has improved. On nutritional supplements. 8. Fall:  Had fall on 10/19/12, which could have been due to orthostasis. Lasix discontinued on 10/21/12, and IVF continued at 50/hr  9. Hyponatremia: Patient had a very mild hyponatremia of 131 at presentation, likely due to dehydration and HCAP. Now resolved with ivi NS.  10. Deconditioning:  Patient is very deconditioned, due to recent acute illness. PT/OT recommends HHPT vs CIR.  11. Protein-calorie malnutrition, severe: Nutrition consult obtained. Managing as recommended.  12. Rheumatoid arthritis: Stable. Patient was on weekly Methotrexate at home. Last dose 10/21/12.  13. Oral thrush: This was an incidental finding on physical exam 10/24/12. Placed on 7-day course of Diflucan, to be concluded on 10/30/12.   Code Status: Full Code.  Family Communication:  Disposition Plan: To be determined.    Brief narrative: 62 year old female patient with O2-dependent COPD, rheumatoid arthritis, morbid obesity, hypothyroidism and diabetes. Presented to the emergency department with reports of shortness of  breath and coughing. Recently hospitalized for 3 days  in early April for COPD exacerbation. She was discharged on doxycycline and a prednisone taper which was completed. She returned to the pulmonology office on 10/06/2012 and at that time her usual Advair was discontinued because she was unable to obtain it, because of her insurance coverage. She was transitioned to budesonide nebulizer twice a day along with Proventil nebulizer. Lasix was increased to 40 mg daily for chronic lower extremity edema and chronic diastolic heart failure. On the date of this admission she returned to her primary care physician office because of progressive shortness of breath and worsening nonproductive cough with right-sided pleurisy, chills and posttussive vomiting. In the physician's office her oxygen saturation was 80% on 3 L of oxygen so she was instructed to report to the emergency department, where she was tachycardic with a heart rate in the 120s but was normotensive. On 3 L of nasal cannula oxygen she was saturating at 89%. EKG was unremarkable except for sinus tachycardia. Chest x-ray revealed diffuse bilateral infiltrates with a worsening infiltrate/consolidation in the right upper lobe and right perihilar region consistent with a worsening pneumonia. She had leukocytosis of 22,000. Upon evaluation of the patient admitting physician ordered an ABG was demonstrated a PO2 of 70 on 3 L of oxygen. Admitted for further management.    Consultants:  N/A.  Procedures:  CXR.  Chest CT scan.  MBS.   Antibiotics:  See Notes.   HPI/Subjective: Feels better. No new issues.   Objective: Vital signs in last 24 hours: Temp:  [98.1 F (36.7 C)-98.6 F (37 C)] 98.2 F (36.8 C) (05/12 0512) Pulse Rate:  [93-109] 93 (05/12 0512) Resp:  [15-18] 18 (05/12 0512) BP: (133-156)/(59-64) 138/59 mmHg (05/12 0512) SpO2:  [94 %-97 %] 97 % (05/12 0951) Weight:  [101.4 kg (223 lb 8.7 oz)] 101.4 kg (223 lb 8.7 oz) (05/12  0512) Weight change: 1.11 kg (2 lb 7.1 oz) Last BM Date: 10/22/12  Intake/Output from previous day: 05/11 0701 - 05/12 0700 In: -  Out: 2800 [Urine:2800]     Physical Exam: General: Comfortable, alert, communicative, fully oriented, not short of breath at rest.  HEENT:  Mild clinical pallor, no jaundice, no conjunctival injection or discharge. Has oral thrush.  NECK:  Supple, JVP not seen, no carotid bruits, no palpable lymphadenopathy, no palpable goiter. CHEST:  Few faint wheezes, no crackles. HEART:  Sounds 1 and 2 heard, normal, regular, no murmurs. ABDOMEN:  Full, soft, non-tender, no palpable organomegaly, no palpable masses, normal bowel sounds. GENITALIA:  Not examined. LOWER EXTREMITIES:  Minimal pitting edema, palpable peripheral pulses. MUSCULOSKELETAL SYSTEM:  Generalized osteoarthritic/rheumatoid changes, otherwise, normal. CENTRAL NERVOUS SYSTEM:  No focal neurologic deficit on gross examination.  Lab Results:  Recent Labs  10/24/12 0600 10/25/12 0633  WBC 18.8* 17.9*  HGB 9.6* 9.8*  HCT 29.6* 30.4*  PLT 257 257    Recent Labs  10/24/12 0600 10/25/12 0633  NA 140 139  K 2.9* 3.3*  CL 105 104  CO2 29 31  GLUCOSE 85 89  BUN 15 14  CREATININE 0.64 0.68  CALCIUM 8.5 8.6   Recent Results (from the past 240 hour(s))  MRSA PCR SCREENING     Status: None   Collection Time    10/15/12  5:12 PM      Result Value Range Status   MRSA by PCR NEGATIVE  NEGATIVE Final   Comment:            The GeneXpert MRSA Assay (FDA  approved for NASAL specimens     only), is one component of a     comprehensive MRSA colonization     surveillance program. It is not     intended to diagnose MRSA     infection nor to guide or     monitor treatment for     MRSA infections.     Studies/Results: Dg Chest 2 View  10/23/2012  *RADIOLOGY REPORT*  Clinical Data: Shortness of breath and cough  CHEST - 2 VIEW  Comparison: 10/18/2012  Findings: Dense right upper lobe  airspace disease has improved slightly.  Chronic pulmonary parenchymal changes elsewhere are again noted with cystic bullous change of underlying emphysema. Heart size is normal.  No pleural effusion.  No acute osseous finding.  IMPRESSION: Improved right upper lobe aeration.  Radiographic resolution of pneumonia may take 4-6 weeks radiographically, with longer time intervals often seen in patients with underlying lung disease such as emphysema.   Original Report Authenticated By: Christiana Pellant, M.D.     Medications: Scheduled Meds: . allopurinol  100 mg Oral Daily  . antiseptic oral rinse  15 mL Mouth Rinse BID  . benzonatate  200 mg Oral TID  . budesonide  0.25 mg Nebulization BID  . docusate sodium  100 mg Oral BID  . enoxaparin (LOVENOX) injection  40 mg Subcutaneous Q24H  . feeding supplement  1 Container Oral TID BM  . fluconazole  100 mg Oral Q24H  . fluticasone  1 spray Each Nare Daily  . folic acid  2 mg Oral Daily  . guaiFENesin  600 mg Oral BID  . levalbuterol  0.63 mg Nebulization Q6H  . levofloxacin  750 mg Oral Q24H  . levothyroxine  50 mcg Oral QAC breakfast  . loratadine  10 mg Oral QHS  . methotrexate  7.5 mg Oral Q Thu  . montelukast  10 mg Oral QHS  . multivitamin with minerals  1 tablet Oral Daily  . nystatin cream   Topical BID  . pantoprazole  40 mg Oral BID  . potassium chloride  40 mEq Oral Daily  . predniSONE  20 mg Oral Q breakfast   Followed by  . [START ON 10/27/2012] predniSONE  10 mg Oral Q breakfast  . tiotropium  1 capsule Inhalation Daily   Continuous Infusions:   PRN Meds:.acetaminophen, acetaminophen, chlorpheniramine-HYDROcodone, levalbuterol, morphine injection, ondansetron (ZOFRAN) IV, ondansetron, oxyCODONE, polyethylene glycol, sodium chloride    LOS: 10 days   Hisayo Delossantos,CHRISTOPHER  [facility] Pager [phone]. If 8PM-8AM, please contact night-coverage at [email or web address], password [email or web address] 10/25/2012, 10:58 AM  LOS: 10 days

## 2012-10-25 NOTE — Progress Notes (Signed)
Physical Therapy Treatment Patient Details Name: Jacqueline Washington MRN: [id number] DOB: [birth year] Today's Date: 10/25/2012 Time: 4098-1191 PT Time Calculation (min): 19 min  PT Assessment / Plan / Recommendation Comments on Treatment Session  Pt continues to have limited activity tolerance.  Feel pt could benefit from ST-SNF to incr I and activity tolerance prior to return home.  Pt's husband works and I believe ST-SNF would allow pt to be safer when she returned home.    Follow Up Recommendations  SNF     Does the patient have the potential to tolerate intense rehabilitation     Barriers to Discharge        Equipment Recommendations  Other (comment) (rollator vs rolling walker)    Recommendations for Other Services    Frequency Min 3X/week   Plan Discharge plan needs to be updated;Frequency remains appropriate    Precautions / Restrictions Precautions Precautions: Fall Restrictions Weight Bearing Restrictions: No   Pertinent Vitals/Pain See flow sheet    Mobility  Bed Mobility Bed Mobility: Not assessed Transfers Sit to Stand: 4: Min guard;With upper extremity assist;With armrests;From chair/3-in-1 Stand to Sit: 4: Min guard;With upper extremity assist;With armrests;To chair/3-in-1 Details for Transfer Assistance: minguard for safety  Ambulation/Gait Ambulation/Gait Assistance: 4: Min assist Ambulation Distance (Feet): 75 Feet Assistive device: Rolling walker Ambulation/Gait Assistance Details: Pt required 1 standing rest break. Gait Pattern: Step-through pattern;Decreased stride length;Trunk flexed General Gait Details: verbal cues to stand more erect and to take standing resting break    Exercises Other Exercises Other Exercises: Pt performed 10 reps of shoulder flexion. No rest breaks needed.   PT Diagnosis:    PT Problem List:   PT Treatment Interventions:     PT Goals Acute Rehab PT Goals PT Goal: Stand - Progress: Progressing toward goal PT Goal:  Ambulate - Progress: Progressing toward goal  Visit Information  Last PT Received On: 10/25/12 Assistance Needed: +1    Subjective Data  Subjective: "I don't think I can take care of myself and my husband works," pt stated.   Cognition  Cognition Arousal/Alertness: Awake/alert Behavior During Therapy: WFL for tasks assessed/performed Overall Cognitive Status: Within Functional Limits for tasks assessed    Balance  Balance Balance Assessed: Yes Static Standing Balance Static Standing - Balance Support: Bilateral upper extremity supported Static Standing - Level of Assistance: 5: Stand by assistance  End of Session PT - End of Session Equipment Utilized During Treatment: Oxygen Activity Tolerance: Patient limited by fatigue Patient left: in chair;with call bell/phone within reach;with family/visitor present   GP     [facility] 10/25/2012, 4:47 PM  [facility] PT [phone]

## 2012-10-26 LAB — CBC
Hemoglobin: 10.3 g/dL — ABNORMAL LOW (ref 12.0–15.0)
MCH: 29.4 pg (ref 26.0–34.0)
MCV: 92.3 fL (ref 78.0–100.0)
Platelets: 235 10*3/uL (ref 150–400)
RBC: 3.5 MIL/uL — ABNORMAL LOW (ref 3.87–5.11)

## 2012-10-26 LAB — BASIC METABOLIC PANEL
CO2: 26 mEq/L (ref 19–32)
Calcium: 8.7 mg/dL (ref 8.4–10.5)
Creatinine, Ser: 0.67 mg/dL (ref 0.50–1.10)
Glucose, Bld: 81 mg/dL (ref 70–99)

## 2012-10-26 MED ORDER — LEVALBUTEROL HCL 0.63 MG/3ML IN NEBU: 0.6300 mg | INHALATION_SOLUTION | Freq: Four times a day (QID) | RESPIRATORY_TRACT | Status: DC

## 2012-10-26 MED ORDER — GUAIFENESIN ER 600 MG PO TB12: 600.0000 mg | ORAL_TABLET | Freq: Two times a day (BID) | ORAL | Status: DC

## 2012-10-26 MED ORDER — BENZONATATE 200 MG PO CAPS: 200.0000 mg | ORAL_CAPSULE | Freq: Three times a day (TID) | ORAL | Status: DC

## 2012-10-26 MED ORDER — FLUCONAZOLE 100 MG PO TABS: 100.0000 mg | ORAL_TABLET | ORAL | Status: AC

## 2012-10-26 MED ORDER — DSS 100 MG PO CAPS: 100.0000 mg | ORAL_CAPSULE | Freq: Two times a day (BID) | ORAL | Status: DC

## 2012-10-26 MED ORDER — METHOTREXATE 2.5 MG PO TABS: 7.5000 mg | ORAL_TABLET | ORAL | Status: DC

## 2012-10-26 MED ORDER — POTASSIUM CHLORIDE CRYS ER 20 MEQ PO TBCR: 40.0000 meq | EXTENDED_RELEASE_TABLET | Freq: Every day | ORAL | Status: DC

## 2012-10-26 MED ORDER — BOOST / RESOURCE BREEZE PO LIQD: 1.0000 | Freq: Three times a day (TID) | ORAL | Status: DC

## 2012-10-26 MED ORDER — OXYCODONE HCL 5 MG PO TABS: 5.0000 mg | ORAL_TABLET | ORAL | Status: DC | PRN

## 2012-10-26 MED ORDER — LEVOFLOXACIN 750 MG PO TABS: 750.0000 mg | ORAL_TABLET | ORAL | Status: AC

## 2012-10-26 MED ORDER — PREDNISONE 10 MG PO TABS: 10.0000 mg | ORAL_TABLET | Freq: Every day | ORAL | Status: DC

## 2012-10-26 MED ORDER — PANTOPRAZOLE SODIUM 40 MG PO TBEC: 40.0000 mg | DELAYED_RELEASE_TABLET | Freq: Two times a day (BID) | ORAL | Status: DC

## 2012-10-26 MED ORDER — LEVALBUTEROL HCL 0.63 MG/3ML IN NEBU: 0.6300 mg | INHALATION_SOLUTION | RESPIRATORY_TRACT | Status: DC | PRN

## 2012-10-26 NOTE — Clinical Social Work Note (Signed)
Clinical Social Worker facilitated discharge by contacting patient with family in room and facility regarding discharge today. Patient's discharge packet will be given to family member who will transport patient to facility. CSW will sign off, as social work intervention is no longer needed.   Jacqueline Washington MSW, [employer] [phone]

## 2012-10-26 NOTE — Clinical Social Work Placement (Addendum)
    Clinical Social Work Department CLINICAL SOCIAL WORK PLACEMENT NOTE 10/26/2012  Patient:  Jacqueline Washington, Jacqueline Washington  Account Number:  [id number] Admit date:  10/15/2012  Clinical Social Worker:  Hulan Fray  Date/time:  10/26/2012 11:03 AM  Clinical Social Work is seeking post-discharge placement for this patient at the following level of care:   SKILLED NURSING   (*CSW will update this form in Epic as items are completed)   10/26/2012  Patient/family provided with [facility] Department of Clinical Social Work's list of facilities offering this level of care within the geographic area requested by the patient (or if unable, by the patient's family).  10/26/2012  Patient/family informed of their freedom to choose among providers that offer the needed level of care, that participate in Medicare, Medicaid or managed care program needed by the patient, have an available bed and are willing to accept the patient.  10/26/2012  Patient/family informed of [facility]' ownership interest in [facility], as well as of the fact that they are under no obligation to receive care at this facility.  PASARR submitted to EDS on 10/26/2012 PASARR number received from [employer] on 10/26/2012  FL2 transmitted to all facilities in geographic area requested by pt/family on  10/26/2012 FL2 transmitted to all facilities within larger geographic area on   Patient informed that his/her managed care company has contracts with or will negotiate with  certain facilities, including the following:     Patient/family informed of bed offers received:  10-26-12 Patient chooses bed at [facility] Physician recommends and patient chooses bed at    Patient to be transferred to [facility] on 10-26-12   Patient to be transferred to facility by Private vehicle  The following physician request were entered in Epic:   Additional Comments:

## 2012-10-26 NOTE — Progress Notes (Signed)
Discharged to home

## 2012-10-26 NOTE — Clinical Social Work Psychosocial (Signed)
     Clinical Social Work Department BRIEF PSYCHOSOCIAL ASSESSMENT 10/26/2012  Patient:  Jacqueline Washington, Jacqueline Washington     Account Number:  [id number]     Admit date:  10/15/2012  Clinical Social Worker:  Hulan Fray  Date/Time:  10/26/2012 10:31 AM  Referred by:  RN  Date Referred:  10/26/2012 Referred for  SNF Placement   Other Referral:   Interview type:  Patient Other interview type:    PSYCHOSOCIAL DATA Living Status:  HUSBAND Admitted from facility:   Level of care:   Primary support name:  Sallyanne Kuster Primary support relationship to patient:  SPOUSE Degree of support available:   supportive    CURRENT CONCERNS Current Concerns  Post-Acute Placement   Other Concerns:    SOCIAL WORK ASSESSMENT / PLAN Clinical Social Worker received referral for need for short term SNF placement at discharge. CSW introduced self and explained reason for visit. Patient had children at bedside. CSW provided SNF list and explained SNF process. Patient reported that she is agreeable for SNF and prefers [facility].    CSW will complete FL2 for MD's signature and continue to follow and update patient when bed offers are made.   Assessment/plan status:  Psychosocial Support/Ongoing Assessment of Needs Other assessment/ plan:   Information/referral to community resources:   SNF packet    PATIENTS/FAMILYS RESPONSE TO PLAN OF CARE: Patient appeared agreeable for short term SNF placement and is hopeful for [facility]. Patient was appreciative of CSW"s visit and assistance.

## 2012-10-27 ENCOUNTER — Other Ambulatory Visit: Payer: Self-pay | Admitting: Geriatric Medicine

## 2012-10-27 MED ORDER — OXYCODONE HCL 5 MG PO TABS: 5.0000 mg | ORAL_TABLET | ORAL | Status: DC | PRN

## 2012-10-28 ENCOUNTER — Non-Acute Institutional Stay (SKILLED_NURSING_FACILITY): Payer: Medicare Other | Admitting: Internal Medicine

## 2012-10-28 DIAGNOSIS — B373 Candidiasis of vulva and vagina: Secondary | ICD-10-CM

## 2012-10-28 DIAGNOSIS — D72829 Elevated white blood cell count, unspecified: Secondary | ICD-10-CM

## 2012-10-28 DIAGNOSIS — I5032 Chronic diastolic (congestive) heart failure: Secondary | ICD-10-CM

## 2012-10-28 DIAGNOSIS — E039 Hypothyroidism, unspecified: Secondary | ICD-10-CM

## 2012-10-28 DIAGNOSIS — M069 Rheumatoid arthritis, unspecified: Secondary | ICD-10-CM

## 2012-10-28 DIAGNOSIS — J9620 Acute and chronic respiratory failure, unspecified whether with hypoxia or hypercapnia: Secondary | ICD-10-CM

## 2012-10-28 DIAGNOSIS — R5381 Other malaise: Secondary | ICD-10-CM

## 2012-10-28 DIAGNOSIS — J449 Chronic obstructive pulmonary disease, unspecified: Secondary | ICD-10-CM

## 2012-10-28 NOTE — Progress Notes (Signed)
Patient ID: Jacqueline Washington, female   DOB: [birth year], 62 y.o.   MRN: [id number]    PCP: Nadean Corwin, MD  Code Status: full code  Allergies  Allergen Reactions  . Penicillins Rash  . Sulfonamide Derivatives Rash  . Tetanus Toxoid Other (See Comments)    Knot on arm    Chief Complaint: new admit post hospitalization 10/15/12- 10/26/12  HPI:  History of present illness:   62 year old female patient with COPD on oxygen, rheumatoid arthritis, morbid obesity, hypothyroidism and diabetes is here for STR post hospital admission with SOB. She was treated for acute respiratory failure in setting of HCAP and severe copd. She was also treated for chf exacerbation. Chest x-ray revealed diffuse bilateral infiltrates with a worsening infiltrate/consolidation in the right upper lobe and right perihilar region consistent with a worsening pneumonia. She was sent to SNF for STR after clinical stabilization. Patient was seen in her room. She is in no acute distress. As per her family member, there has been blood in the urine. No other concers. She has not started working with PT/OT yet. Her appetite is fair. She feels weak and is willing to work with therapy team  Past Medical History  Diagnosis Date  . Asthma   . Rheumatoid arthritis   . Emphysema     02 dependent 3L/min  . Emphysema   . Pneumonia   . Hypothyroidism   . Diabetes mellitus, type II   . Obesity   . Diastolic heart failure 09/22/2012    Grade 1   Past Surgical History  Procedure Laterality Date  . Eye surgery  at age 89  . Tubal ligation    . Colonoscopy     Social History:   reports that she quit smoking about 5 years ago. Her smoking use included Cigarettes. She has a 129 pack-year smoking history. She has never used smokeless tobacco. She reports that  drinks alcohol. She reports that she does not use illicit drugs.  Family History  Problem Relation Age of Onset  . Asthma Mother   . Heart disease Mother   . Clotting  disorder Mother   . Colon cancer Neg Hx   . Heart attack Father     Medications: Patient's Medications  New Prescriptions   No medications on file  Previous Medications   ALLOPURINOL (ZYLOPRIM) 100 MG TABLET    Take 100 mg by mouth daily.   ARFORMOTEROL (BROVANA) 15 MCG/2ML NEBU    Take 2 mLs (15 mcg total) by nebulization 2 (two) times daily. Dx: 491.20   ASCORBIC ACID (VITAMIN C) 1000 MG TABLET    Take 1,000 mg by mouth daily.   BENZONATATE (TESSALON) 200 MG CAPSULE    Take 1 capsule (200 mg total) by mouth 3 (three) times daily.   BUDESONIDE (PULMICORT) 0.25 MG/2ML NEBULIZER SOLUTION    Take 2 mLs (0.25 mg total) by nebulization 2 (two) times daily. Dx: 491.20   CETIRIZINE (ZYRTEC) 10 MG TABLET    Take 1 tablet (10 mg total) by mouth daily.   CHOLECALCIFEROL (VITAMIN D-3) 5000 UNITS TABS    Take 5,000 Units by mouth daily.   CYANOCOBALAMIN PO    Take 1 tablet by mouth every Monday, Wednesday, and Friday.   DOCUSATE SODIUM 100 MG CAPS    Take 100 mg by mouth 2 (two) times daily.   FEEDING SUPPLEMENT (RESOURCE BREEZE) LIQD    Take 1 Container by mouth 3 (three) times daily between meals.   FLUCONAZOLE (DIFLUCAN) 100  MG TABLET    Take 1 tablet (100 mg total) by mouth daily.   FLUTICASONE (FLONASE) 50 MCG/ACT NASAL SPRAY    Place 1 spray into both nostrils daily as needed for allergies.   FOLIC ACID (FOLVITE) 1 MG TABLET    Take 2 mg by mouth daily.    FUROSEMIDE (LASIX) 40 MG TABLET    Take 40 mg by mouth daily.    GUAIFENESIN (MUCINEX) 600 MG 12 HR TABLET    Take 1 tablet (600 mg total) by mouth 2 (two) times daily.   LEVALBUTEROL (XOPENEX) 0.63 MG/3ML NEBULIZER SOLUTION    Take 3 mLs (0.63 mg total) by nebulization every 4 (four) hours as needed for wheezing or shortness of breath.   LEVALBUTEROL (XOPENEX) 0.63 MG/3ML NEBULIZER SOLUTION    Take 3 mLs (0.63 mg total) by nebulization every 6 (six) hours.   LEVOTHYROXINE (SYNTHROID, LEVOTHROID) 50 MCG TABLET    Take 50 mcg by mouth daily  before breakfast.   MAGNESIUM-ZINC PO    Take 2 tablets by mouth daily.    METHOTREXATE (RHEUMATREX) 2.5 MG TABLET    Take 3 tablets (7.5 mg total) by mouth once a week. Thursdays   MONTELUKAST (SINGULAIR) 10 MG TABLET    Take 1 tablet by mouth daily.   MULTIPLE VITAMIN (MULTIVITAMIN) CAPSULE    Take 1 capsule by mouth daily.   NYSTATIN CREAM (MYCOSTATIN)    Apply 1 application topically daily as needed (under breasts).   OXYCODONE (OXY IR/ROXICODONE) 5 MG IMMEDIATE RELEASE TABLET    Take 1 tablet (5 mg total) by mouth every 4 (four) hours as needed.   PANTOPRAZOLE (PROTONIX) 40 MG TABLET    Take 1 tablet (40 mg total) by mouth 2 (two) times daily.   POTASSIUM CHLORIDE SA (K-DUR,KLOR-CON) 20 MEQ TABLET    Take 2 tablets (40 mEq total) by mouth daily.   PREDNISONE (DELTASONE) 10 MG TABLET    Take 1 tablet (10 mg total) by mouth daily with breakfast.   SODIUM CHLORIDE (OCEAN) 0.65 % NASAL SPRAY    Place 1 spray into both nostrils daily as needed for congestion.   STUDY MEDICATION    Take 150 mg by mouth daily. Vertrex 104 Study for rheumatoid arthritis   TIOTROPIUM (SPIRIVA HANDIHALER) 18 MCG INHALATION CAPSULE    Place 1 capsule (18 mcg total) into inhaler and inhale daily.  Modified Medications   No medications on file  Discontinued Medications   No medications on file    Physical Exam: Filed Vitals:   10/28/12 0758  BP: 137/66  Pulse: 60  Resp: 20  Height: 5\' 4"  (1.626 m)  Weight: 211 lb (95.709 kg)  SpO2: 96%   General: elderly, Comfortable, alert, communicative  HEENT: Mild clinical pallor, no jaundice, no conjunctival injection or discharge NECK:  no carotid bruits, no palpable lymphadenopathy, no palpable goiter, no jvd CHEST: occassional wheeze present, no crackles.  on o2 by nasal canula HEART: Sounds 1 and 2 heard, normal, regular, no murmurs.   ABDOMEN: Full, soft, non-tender, no palpable organomegaly, no palpable masses, normal bowel sounds.   GENITALIA: dryness with  erythema present in vaginal, white discharge present, some old blood clot noted in the urethral area LOWER EXTREMITIES: Minimal pitting edema, palpable peripheral pulses.   MUSCULOSKELETAL SYSTEM: Generalized osteoarthritic/rheumatoid changes, otherwise, normal.   CENTRAL NERVOUS SYSTEM: No focal neurologic deficit on gross examination.  Labs reviewed: Basic Metabolic Panel:  Recent Labs  84/13/24 0413 10/19/12 0500  10/24/12 0600 10/25/12  1610 10/26/12 0450  NA 136 132*  < > 140 139 138  K 2.7* 3.1*  < > 2.9* 3.3* 4.1  CL 98 96  < > 105 104 103  CO2 30 24  < > 29 31 26   GLUCOSE 91 85  < > 85 89 81  BUN 10 10  < > 15 14 15   CREATININE 0.84 0.81  < > 0.64 0.68 0.67  CALCIUM 8.5 8.7  < > 8.5 8.6 8.7  MG  --  1.5  --   --   --  2.1  < > = values in this interval not displayed. Liver Function Tests:  Recent Labs  09/21/12 0441 10/16/12 0640 10/24/12 0600  AST 30 39* 28  ALT 47* 17 14  ALKPHOS 97 123* 78  BILITOT 0.1* 0.3 0.2*  PROT 6.2 6.4 5.8*  ALBUMIN 2.6* 1.5* 1.7*    Recent Labs  10/15/12 1452  LIPASE 17   No results found for this basename: AMMONIA,  in the last 8760 hours CBC:  Recent Labs  10/19/12 0744  10/24/12 0600 10/25/12 0633 10/26/12 0450  WBC 22.0*  < > 18.8* 17.9* 16.1*  NEUTROABS 16.6*  --   --   --   --   HGB 10.2*  < > 9.6* 9.8* 10.3*  HCT 31.0*  < > 29.6* 30.4* 32.3*  MCV 89.1  < > 91.4 91.3 92.3  PLT 265  < > 257 257 235  < > = values in this interval not displayed. Cardiac Enzymes:  Recent Labs  09/20/12 1314  TROPONINI <0.30   BNP: No components found with this basename: POCBNP,  CBG:  Recent Labs  10/25/12 0756 10/25/12 1936 10/26/12 0759  GLUCAP 88 136* 102*   10/27/12 wbc 18.4,hb 10.6, hct 32, plt 295, na 139, k 4.3, glu 89, bun 17, cr 0.70, ca 8.6, mg 1.8  Radiological Exams: Significant Diagnostic Studies: Dg Chest 2 View  10/23/2012  *RADIOLOGY REPORT*  Clinical Data: Shortness of breath and cough  CHEST - 2  VIEW  Comparison: 10/18/2012  Findings: Dense right upper lobe airspace disease has improved slightly.  Chronic pulmonary parenchymal changes elsewhere are again noted with cystic bullous change of underlying emphysema. Heart size is normal.  No pleural effusion.  No acute osseous finding.  IMPRESSION: Improved right upper lobe aeration.  Radiographic resolution of pneumonia may take 4-6 weeks radiographically, with longer time intervals often seen in patients with underlying lung disease such as emphysema.   Original Report Authenticated By: Christiana Pellant, M.D.    Dg Chest 2 View  10/15/2012  *RADIOLOGY REPORT*  Clinical Data: Pneumonia, dehydration  CHEST - 2 VIEW  Comparison: 10/06/2012  Findings: Cardiomediastinal silhouette is stable.  Diffuse bilateral infiltrates are again noted.  Worsening infiltrate/consolidation in the right upper lobe and right perihilar region.  Follow-up to resolution after treatment is recommended.  IMPRESSION: Diffuse bilateral infiltrates again noted.  Worsening infiltrates/consolidation in the right upper lobe and right perihilar region consistent with worsening pneumonia.  Follow-up to resolution after treatment is recommended.   Original Report Authenticated By: Natasha Mead, M.D.    Dg Chest 2 View  10/06/2012  *RADIOLOGY REPORT*  Clinical Data: Wheezing.  Cough and shortness of breath and chest pain.  COPD.  CHEST - 2 VIEW  Comparison: 09/20/2012, 09/14/2012 and 01/12/2012  Findings: The patient has developed extensive new infiltrates since 09/20/2012 in the upper lobes bilaterally, most prominently seen on the lateral view.  The heart size  and vascularity are normal.  Markings are slightly increased the lung bases as well as compared the prior study.  No effusions.  IMPRESSION: New bilateral upper lobe infiltrates superimposed on chronic lung disease.   Original Report Authenticated By: Francene Boyers, M.D.    Ct Chest W Contrast  10/19/2012  *RADIOLOGY REPORT*  Clinical  Data: Worsening pneumonia.  CT CHEST WITH CONTRAST  Technique:  Multidetector CT imaging of the chest was performed following the standard protocol during bolus administration of intravenous contrast.  Contrast:  100 ml Omnipaque-300  Comparison: CT scan 06/27/2011.  Findings: The chest wall is unremarkable.  No breast mass, supraclavicular or axillary lymphadenopathy.  The bony thorax is intact.  No destructive bone lesions or spinal canal compromise.  The heart is normal in size.  No pericardial effusion.  There are scattered borderline mediastinal and right hilar lymph nodes which are likely inflammatory/hyperplastic.  The aorta is normal in caliber.  No dissection.  The esophagus is grossly normal. Residual contrast noted in the esophagus.  There is dense airspace consolidation in the right upper lobe with underlying emphysematous changes.  Findings most consistent with lumbar pneumonia.  No obvious mass.  Air bronchograms noted throughout.  No definite abscess.  There is also infiltrate in the right middle lobe and patchy infiltrate in the right lower lobe. Significant underlying emphysema and areas of pulmonary fibrosis. The very small right pleural effusion is noted.  The upper abdomen is grossly normal.  IMPRESSION:  1.  Extensive right lung pneumonia mainly involving the right upper lobe and right middle lobe. 2.  Reactive/hyperplastic mediastinal and hilar lymph nodes. 3.  Small right pleural effusion.  Original Report Authenticated By: Rudie Meyer, M.D.    Dg Chest Port 1 View  10/18/2012  *RADIOLOGY REPORT*  Clinical Data: Pneumonia.  Pulmonary edema.  Cough.  PORTABLE CHEST - 1 VIEW  Comparison: 10/15/2012.  Findings: Compared to the prior exam, the right lung diffuse airspace disease with focal right upper lobe consolidation has worsened.  This may represent hydration affects of pneumonia. Chronic-appearing interstitial changes of the left chest appears similar to the prior exam. Monitoring leads  are projected over the chest. No pneumothorax.  Cardiomediastinal contours appear little changed.  IMPRESSION: Worsening right lung airspace disease with dense consolidation in the right upper lobe.  Findings are most compatible with pneumonia. Worsening density is probably due to hydration affects.   Original Report Authenticated By: Andreas Newport, M.D.    Dg Swallowing Func-speech Pathology  10/19/2012  Breck Coons [place], CCC-SLP     10/19/2012  3:08 PM Objective Swallowing Evaluation: Modified Barium Swallowing Study   Patient Details  Name: Jacqueline Washington MRN: [id number] Date of Birth: [birth year]  Today's Date: 10/19/2012 Time: 1329-1340 SLP Time Calculation (min): 11 min  Past Medical History:  Past Medical History  Diagnosis Date  . Asthma   . Rheumatoid arthritis   . Emphysema     02 dependent 3L/min  . Emphysema   . Pneumonia   . Hypothyroidism   . Diabetes mellitus, type II   . Obesity   . Diastolic heart failure 09/22/2012    Grade 1   Past Surgical History:  Past Surgical History  Procedure Laterality Date  . Eye surgery  at age 54  . Tubal ligation    . Colonoscopy     HPI:  62 year old female admitted with HCAP. High suspicion for  aspiration PNA per MD notes as children mention she coughs  frequently  during meals in addition to her chronic appearing  cough. PMH of severe O2 dependent COPD. Her chest x-ray reveals  diffuse bilateral infiltrates, worsening infiltrate/consolidation  in the right upper lobe and right perihilar region consistent  with worsening pneumonia.   MBS recommended     Assessment / Plan / Recommendation Clinical Impression  Dysphagia Diagnosis: Mild pharyngeal phase dysphagia Clinical impression: Pt.'s head of bed/chair was raised and she  became nauseated with increased SOB.  SLP allowed pt. 5 minutes  in which her nausea subsided and respiration rate decreased  slightly.  She demonstrated mild pharyngeal dysphagia with  laryngeal penetration likely due to increased respiratory rate   and decreased coordination of swallow and respiration.  Thin  liquids were penetration (flash with straw x 1, flash with cup x1  and minimal remained on anterior wall of vestibule).  Swallow  initiation was timely without significant residue (one instance  of lingual residue falling to pyriform sinuses post swallow.   Brief esophageal revealed possible min-mild stasis (no  radiologist present to confirm).  Recommend regular diet texture  and thin liquids.  SLP stressed the importance of not  eating/drinking when she is very SOB, taking small sips and bites  and no straws, pills whole in applesauce.  SLP will continue to  folllow.    Treatment Recommendation  Therapy as outlined in treatment plan below    Diet Recommendation Regular;Thin liquid  Liquid Administration via: Cup;No straw Medication Administration: Whole meds with puree Supervision: Patient able to self feed;Intermittent supervision  to cue for compensatory strategies Compensations: Slow rate;Small sips/bites Postural Changes and/or Swallow Maneuvers: Seated upright 90  degrees;Upright 30-60 min after meal    Other  Recommendations Oral Care Recommendations: Oral care BID   Follow Up Recommendations   (tba)    Frequency and Duration min 2x/week  1 week   Pertinent Vitals/Pain     SLP Swallow Goals Patient will utilize recommended strategies during swallow to  increase swallowing safety with: Supervision/safety      Reason for Referral Objectively evaluate swallowing function   Oral Phase Oral Preparation/Oral Phase Oral Phase: WFL   Pharyngeal Phase Pharyngeal Phase Pharyngeal Phase: Impaired Pharyngeal - Thin Pharyngeal - Thin Cup: Penetration/Aspiration during swallow Penetration/Aspiration details (thin cup): Material enters  airway, remains ABOVE vocal cords then ejected out;Material  enters airway, remains ABOVE vocal cords and not ejected out Pharyngeal - Thin Straw: Penetration/Aspiration during  swallow;Pharyngeal residue - valleculae (mild  lingual residue  spill into vallecuale) Penetration/Aspiration details (thin straw): Material enters  airway, remains ABOVE vocal cords then ejected out Pharyngeal - Solids Pharyngeal - Regular: Within functional limits  Cervical Esophageal Phase        Cervical Esophageal Phase Cervical Esophageal Phase: [facility]        Darrow Bussing.Ed CCC-SLP Pager [phone]  10/19/2012     Assessment/Plan  Acute on chronic respiratory failure with hypoxia- Patient has chronic respiratory failure, and is on 3L/min O2 at baseline.she had HCAP and CHF exacerbation. She has completed antibiotic treatment. Will continue breathing treatment and oxygen   COPD, severe-  Patient had recent exacerbation in setting of pneumonia and has completed antibiotics. Continue steroids, oxygen supplementation and bronchodilators. To complete steroid tapering course    Chronic diastolic heart failure, NYHA class 1- stable currently. Monitor clinically. Weight monitor  Anorexia- Patient had very poor PO intake in the initial few days of admission, but has been improving since. Continue supplements and monitor clinically  Deconditioning- Patient  is deconditioned, due to recent acute illness. Need to work with PT/OT, fall precautions. Continue nutritional supplements  Candidiasis- she is on prednisone and this could be contributing to it. Will have her on fluconazole 150 mg daily for 3 days and will also provide premarin cream. Hematuria likely from urethral trauma during foley removal in hospital. Monitor for now  RA- continue home medication- methotrexate with folic acid, no recent flare up  Leukocytosis- is on prednisone which could be causing elevated wbc count. Monitor clinically with temperature curve and change of mental status. Also recheck cbc in 1 week  hypothyroidisim- continue levothyroxine and monitor  Family/ staff Communication: discussed care plan with patient, family and nursing supervisor   Goals of care:  completion of STR and return home   Labs/tests ordered- cbc, cmp, prealbumin

## 2012-11-04 ENCOUNTER — Non-Acute Institutional Stay (SKILLED_NURSING_FACILITY): Payer: Medicare Other | Admitting: Internal Medicine

## 2012-11-04 DIAGNOSIS — R05 Cough: Secondary | ICD-10-CM

## 2012-11-04 DIAGNOSIS — J449 Chronic obstructive pulmonary disease, unspecified: Secondary | ICD-10-CM

## 2012-11-04 DIAGNOSIS — K219 Gastro-esophageal reflux disease without esophagitis: Secondary | ICD-10-CM

## 2012-11-04 NOTE — Progress Notes (Signed)
Patient ID: Jacqueline Washington, female   DOB: May 16, 1951, 62 y.o.   MRN: [id number]  CC-  Cough  HPI- 62 y/o female patient with history of copd on o2, morbid obesity, RA and diabetes among others is here for STR post hospital admission for chf exacerbation and pneumonia. She was seen today with complaints of bouts of cough. She mentions this to have started since being in the hospital. Warm weather triggers it and then with frequent bouts, she has vomiting. Cool air provides relief. Cough is dry and denies any heaves. No other uri symptoms present  ROS- No fever or chills No runny nose, ear ache or sore throat No chest pian  breathing comfortable with current oxygen and breathing treatment Appetite is good Working with PT/OT  Past Medical History  Diagnosis Date  . Asthma   . Rheumatoid arthritis   . Emphysema     02 dependent 3L/min  . Emphysema   . Pneumonia   . Hypothyroidism   . Diabetes mellitus, type II   . Obesity   . Diastolic heart failure 09/22/2012    Grade 1   BP 110/50  Pulse 88  Temp(Src) 98 F (36.7 C)  Resp 18  Ht 5\' 4"  (1.626 m)  Wt 210 lb (95.255 kg)  BMI 36.03 kg/m2  SpO2 95%  General: Comfortable, in no distress  HEENT: Mild clinical pallor,no conjunctival injection or discharge NECK: Supple, no carotid bruits or JVD, no LAD RESPI: b/l decreased air entry, wheeze present, no rhonchi HEART: normla s1,s2, regular, no murmurs  ABDOMEN: soft, non-tender, no palpable organomegaly and normal bowel sounds.   LOWER EXTREMITIES: Minimal pitting edema, palpable peripheral pulses.   MUSCULOSKELETAL SYSTEM: Generalized osteoarthritic/rheumatoid changes, otherwise, normal.   CNS: no focal deficit  Medications- reviewed  LABS- 10/29/12 Wbc 12.5, hb 9.2, hct 26.8, plt 245, neutrophil 76, na 139, k 3.9, cl 102, co2 29, glu 83, bun 11, cr 0.64, prealb 12.5, sgot 68, sgpt 66, t.pro 5.8, alb 2.4  ASSESSMENT/PLAN-  Cough- persistent, triggered by warm weather and  wheezing raises concerns for cough variant asthma. It comes in mainly with meals raising concern for reflux as well. She has been a smoker and has known hx of copd. Also has hx of gerd. Upper respiratory cough syndrome is less likely with all other uri symptoms being absent. Pt has also been on cetirizine without any help. Not on any ACEI. Continue levalbuterol prn with budesonide and tiotropium and she is already on monteleukast. Has upcoming pulmonary appointment on 5.28.14. Also on rx for GERD. Will add metoclopramide 10 mg tid before meals for a week and reassess to see if this helps with non acidic reflux symptoms here

## 2012-11-08 DIAGNOSIS — B373 Candidiasis of vulva and vagina: Secondary | ICD-10-CM | POA: Insufficient documentation

## 2012-11-08 DIAGNOSIS — B3731 Acute candidiasis of vulva and vagina: Secondary | ICD-10-CM | POA: Insufficient documentation

## 2012-11-08 DIAGNOSIS — M069 Rheumatoid arthritis, unspecified: Secondary | ICD-10-CM | POA: Insufficient documentation

## 2012-11-08 DIAGNOSIS — R5381 Other malaise: Secondary | ICD-10-CM | POA: Insufficient documentation

## 2012-11-10 ENCOUNTER — Encounter: Payer: Self-pay | Admitting: Critical Care Medicine

## 2012-11-10 ENCOUNTER — Ambulatory Visit (INDEPENDENT_AMBULATORY_CARE_PROVIDER_SITE_OTHER): Payer: Medicare Other | Admitting: Critical Care Medicine

## 2012-11-10 ENCOUNTER — Ambulatory Visit (INDEPENDENT_AMBULATORY_CARE_PROVIDER_SITE_OTHER)
Admission: RE | Admit: 2012-11-10 | Discharge: 2012-11-10 | Disposition: A | Discharge status: Home / Self Care | Payer: Medicare Other | Source: Ambulatory Visit | Attending: Critical Care Medicine | Admitting: Critical Care Medicine

## 2012-11-10 ENCOUNTER — Other Ambulatory Visit (INDEPENDENT_AMBULATORY_CARE_PROVIDER_SITE_OTHER): Payer: Medicare Other

## 2012-11-10 VITALS — BP 110/72 | HR 120 | Temp 98.1°F | Ht 64.0 in | Wt 205.6 lb

## 2012-11-10 DIAGNOSIS — J189 Pneumonia, unspecified organism: Secondary | ICD-10-CM

## 2012-11-10 DIAGNOSIS — E876 Hypokalemia: Secondary | ICD-10-CM

## 2012-11-10 DIAGNOSIS — J449 Chronic obstructive pulmonary disease, unspecified: Secondary | ICD-10-CM

## 2012-11-10 LAB — COMPREHENSIVE METABOLIC PANEL
Albumin: 2.3 g/dL — ABNORMAL LOW (ref 3.5–5.2)
CO2: 29 mEq/L (ref 19–32)
Calcium: 8.7 mg/dL (ref 8.4–10.5)
GFR: 78.37 mL/min (ref >60.00)
Glucose, Bld: 94 mg/dL (ref 70–99)
Potassium: 2.8 mEq/L — CL (ref 3.5–5.1)
Sodium: 133 mEq/L — ABNORMAL LOW (ref 135–145)
Total Protein: 7.7 g/dL (ref 6.0–8.3)

## 2012-11-10 LAB — CBC
RDW: 16.2 % — ABNORMAL HIGH (ref 11.5–14.6)
WBC: 10.7 10*3/uL — ABNORMAL HIGH (ref 4.5–10.5)

## 2012-11-10 NOTE — Progress Notes (Signed)
Quick Note:  Notify the patient her potassium level is low  Call the [facility] nursing home to give potassium once  Then continue current dosing ______

## 2012-11-10 NOTE — Progress Notes (Signed)
Quick Note:  lmomtcb on pt's home # for her tcb  [facility] at [phone]. Spoke with Arloa Koh, director of nursing. Informed of lab results and gave VO for below K order per PW. Toniann Fail verbalized understanding. States no rx is needed. She will be able to get K for pt. Toniann Fail verbalized understanding and voiced no further questions or concerns at this time. ______

## 2012-11-10 NOTE — Patient Instructions (Signed)
Chest xray today  Labs today

## 2012-11-10 NOTE — Progress Notes (Signed)
Subjective:    Patient ID: Jacqueline Washington, female    DOB: 06-Dec-1950, 62 y.o.   MRN: [id number]  HPI  11/10/2012 Pt in hospital .  5/2-5/13   HCAP  Prior adm 09/2012 Hx of RUL PNA. Adm 09/2012 d/c only to return shortly thereafter 5/2- 10/26/12. Pt with resp failure on bipap. Pt rx further with steroids/ABX. Now is better. Pt now in snf rehab.  Notes low grade temp, minimal mucus. Dyspnea seems back to baseline. Temp never any higher than 99.  Review of Systems  Constitutional:   No  weight loss, night sweats,  Fevers, chills,  +fatigue, or  lassitude.  HEENT:   No headaches,  Difficulty swallowing,  Tooth/dental problems, or  Sore throat,                No sneezing, itching, ear ache,  +nasal congestion, post nasal drip,   CV:  No chest pain,  Orthopnea, PND, swelling in lower extremities, anasarca, dizziness, palpitations, syncope.   GI  No heartburn, indigestion, abdominal pain, nausea, vomiting, diarrhea, change in bowel habits, loss of appetite, bloody stools.   Resp:   No wheezing.  No chest wall deformity  Skin: no rash or lesions.  GU: no dysuria, change in color of urine, no urgency or frequency.  No flank pain, no hematuria   MS:    No decreased range of motion.  No back pain.  Psych:  No change in mood or affect. No depression or anxiety.  No memory loss.      Objective:   Physical Exam BP 110/72  Pulse 120  Temp(Src) 98.1 F (36.7 C) (Oral)  Ht 5\' 4"  (1.626 m)  Wt 205 lb 9.6 oz (93.26 kg)  BMI 35.27 kg/m2  SpO2 95%  GEN: A/Ox3; pleasant , NAD   HEENT:  [place]/AT,  EACs-clear, TMs-wnl, NOSE-clear, THROAT-clear, no lesions, no postnasal drip or exudate noted.   NECK:  Supple w/ fair ROM; no JVD; normal carotid impulses w/o bruits; no thyromegaly or nodules palpated; no lymphadenopathy.  RESP: Few rhonchi  w/o, wheezes/ rales/ or rhonchi.no accessory muscle use, no dullness to percussion  CARD:  RRR, no m/r/g  , 1+  peripheral edema, pulses intact, no cyanosis  or clubbing.  GI:   Soft & nt; nml bowel sounds; no organomegaly or masses detected.  Musco: Warm bil   Neuro: alert, no focal deficits noted.    Skin: Warm, no lesions or rashes   Recent Labs Lab 11/10/12 1641  NA 133*  K 2.8*  CL 98  CO2 29  GLUCOSE 94  BUN 7  CREATININE 0.8  CALCIUM 8.7    Dg Chest 2 View  11/10/2012   *RADIOLOGY REPORT*  Clinical Data: Evaluate for pneumonia  CHEST - 2 VIEW  Comparison: 10/23/2012  Findings: Heart size appears normal.  There is chronic interstitial coarsening identified throughout both lungs compatible with chronic lung disease.  Dense airspace consolidation within the right upper lobe is again identified.  This appears slightly improved in aeration to previous exam.  IMPRESSION:  1.  Dense right upper lobe airspace consolidation.  This is slightly improved from previous exam. 2.  Chronic lung disease.   Original Report Authenticated By: Signa Kell, M.D.       Assessment & Plan:   Pneumonia RUL PNA  Dx 09/2012, very slow to resolve radiographically on 11/11/2012 Volume loss in RUL, concern re poss CA in pt with exsmoking and COPD Plan FOB in one week after  d/c from SNF rehab No further ABX for now  Hypokalemia Potassium 2.8  Plan Repletion   OBSTRUCTIVE CHRONIC BRONCHITIS WITHOUT EXACERBAT Asthmatic bronchitis with copd elements Plan Cont inhaled meds as Rx    Updated Medication List Outpatient Encounter Prescriptions as of 11/10/2012  Medication Sig Dispense Refill  . allopurinol (ZYLOPRIM) 100 MG tablet Take 100 mg by mouth daily.      Marland Kitchen arformoterol (BROVANA) 15 MCG/2ML NEBU Take 2 mLs (15 mcg total) by nebulization 2 (two) times daily. Dx: 491.20  120 mL  6  . Ascorbic Acid (VITAMIN C) 1000 MG tablet Take 1,000 mg by mouth daily.      . benzonatate (TESSALON) 200 MG capsule Take 1 capsule (200 mg total) by mouth 3 (three) times daily.  21 capsule  0  . budesonide (PULMICORT) 0.25 MG/2ML nebulizer solution Take 2 mLs  (0.25 mg total) by nebulization 2 (two) times daily. Dx: 491.20  120 mL  6  . cetirizine (ZYRTEC) 10 MG tablet Take 1 tablet (10 mg total) by mouth daily.  30 tablet  0  . Cholecalciferol (VITAMIN D-3) 5000 UNITS TABS Take 5,000 Units by mouth daily.      . CYANOCOBALAMIN PO Take 1 tablet by mouth every Monday, Wednesday, and Friday.      . docusate sodium 100 MG CAPS Take 100 mg by mouth 2 (two) times daily.  10 capsule  0  . feeding supplement (RESOURCE BREEZE) LIQD Take 1 Container by mouth 3 (three) times daily between meals.  90 Container  0  . fluticasone (FLONASE) 50 MCG/ACT nasal spray Place 1 spray into both nostrils daily as needed for allergies.      . folic acid (FOLVITE) 1 MG tablet Take 2 mg by mouth daily.       . furosemide (LASIX) 40 MG tablet Take 40 mg by mouth daily.       Marland Kitchen guaiFENesin (MUCINEX) 600 MG 12 hr tablet Take 1 tablet (600 mg total) by mouth 2 (two) times daily.  14 tablet  0  . levalbuterol (XOPENEX) 0.63 MG/3ML nebulizer solution Take 3 mLs (0.63 mg total) by nebulization every 4 (four) hours as needed for wheezing or shortness of breath.  3 mL  0  . levalbuterol (XOPENEX) 0.63 MG/3ML nebulizer solution Take 3 mLs (0.63 mg total) by nebulization every 6 (six) hours.  3 mL  0  . levothyroxine (SYNTHROID, LEVOTHROID) 50 MCG tablet Take 50 mcg by mouth daily before breakfast.      . MAGNESIUM-ZINC PO Take 2 tablets by mouth daily.       . methotrexate (RHEUMATREX) 2.5 MG tablet Take 3 tablets (7.5 mg total) by mouth once a week. Thursdays  4 tablet  0  . montelukast (SINGULAIR) 10 MG tablet Take 1 tablet by mouth daily.      . Multiple Vitamin (MULTIVITAMIN) capsule Take 1 capsule by mouth daily.      Marland Kitchen nystatin cream (MYCOSTATIN) Apply 1 application topically daily as needed (under breasts).      . pantoprazole (PROTONIX) 40 MG tablet Take 1 tablet (40 mg total) by mouth 2 (two) times daily.  60 tablet  0  . potassium chloride SA (K-DUR,KLOR-CON) 20 MEQ tablet Take  2 tablets (40 mEq total) by mouth daily.  30 tablet  0  . predniSONE (DELTASONE) 10 MG tablet Take 1 tablet (10 mg total) by mouth daily with breakfast.  3 tablet  0  . sodium chloride (OCEAN) 0.65 % nasal spray  Place 1 spray into both nostrils daily as needed for congestion.      Marland Kitchen tiotropium (SPIRIVA HANDIHALER) 18 MCG inhalation capsule Place 1 capsule (18 mcg total) into inhaler and inhale daily.  30 capsule  11  . oxyCODONE (OXY IR/ROXICODONE) 5 MG immediate release tablet Take 1 tablet (5 mg total) by mouth every 4 (four) hours as needed.  180 tablet  0  . STUDY MEDICATION Take 150 mg by mouth daily. Vertrex 104 Study for rheumatoid arthritis       No facility-administered encounter medications on file as of 11/10/2012.

## 2012-11-11 ENCOUNTER — Telehealth: Payer: Self-pay | Admitting: *Deleted

## 2012-11-11 ENCOUNTER — Telehealth: Payer: Self-pay | Admitting: Critical Care Medicine

## 2012-11-11 ENCOUNTER — Non-Acute Institutional Stay (SKILLED_NURSING_FACILITY): Payer: Medicare Other | Admitting: Internal Medicine

## 2012-11-11 DIAGNOSIS — E876 Hypokalemia: Secondary | ICD-10-CM

## 2012-11-11 DIAGNOSIS — E039 Hypothyroidism, unspecified: Secondary | ICD-10-CM

## 2012-11-11 DIAGNOSIS — K219 Gastro-esophageal reflux disease without esophagitis: Secondary | ICD-10-CM

## 2012-11-11 DIAGNOSIS — R5381 Other malaise: Secondary | ICD-10-CM

## 2012-11-11 DIAGNOSIS — J449 Chronic obstructive pulmonary disease, unspecified: Secondary | ICD-10-CM

## 2012-11-11 DIAGNOSIS — M069 Rheumatoid arthritis, unspecified: Secondary | ICD-10-CM

## 2012-11-11 NOTE — Progress Notes (Signed)
Patient ID: Jacqueline Washington, female   DOB: 04-07-1951, 62 y.o.   MRN: [id number]  CC- discharge visit  Allergies  Allergen Reactions  . Penicillins Rash  . Sulfonamide Derivatives Rash  . Tetanus Toxoid Other (See Comments)    Knot on arm   HPI- 62 y/o female patient with COPD on oxygen, rheumatoid arthritis, morbid obesity, hypothyroidism and diabetes is here for STR post hospital admission with HCAP and copd exacerbation. She has worked well with PT/OT and is now stable to be discharged home. She mentions about her potassium level being checked at pulmonary clinic yesterday was 2.8. She denies any muscle weakness or cramps. Her vaginal discharge has resolved and no further hematuria  Review of Systems  Constitutional: Negative for fever and chills.  HENT: Negative for congestion.   Eyes: Negative for blurred vision.  Respiratory: Positive for shortness of breath. Negative for cough.   Cardiovascular: Negative for chest pain, palpitations and leg swelling.  Gastrointestinal: Negative for nausea, vomiting and abdominal pain.  Genitourinary: Negative for dysuria.  Skin: Negative for rash.  Neurological: Negative for dizziness, focal weakness, weakness and headaches.  Psychiatric/Behavioral: Negative for depression.   Past Medical History  Diagnosis Date  . Asthma   . Rheumatoid arthritis(714.0)   . Emphysema     02 dependent 3L/min  . Emphysema   . Pneumonia   . Hypothyroidism   . Diabetes mellitus, type II   . Obesity   . Diastolic heart failure 09/22/2012    Grade 1   Past Surgical History  Procedure Laterality Date  . Eye surgery  at age 38  . Tubal ligation    . Colonoscopy     MAR reviewed  BP 121/76  Pulse 90  Temp(Src) 97.9 F (36.6 C)  Resp 18  Wt 203 lb (92.08 kg)  BMI 34.83 kg/m2  SpO2 92%  General: elderly, Comfortable, alert, communicative, obese HEENT: Mild clinical pallor, no jaundice, no conjunctival injection or discharge NECK:  no carotid bruits,  no palpable lymphadenopathy, no palpable goiter, no jvd CHEST: occassional wheeze present, no crackles.  on o2 by nasal canula HEART: Sounds 1 and 2 heard, normal, regular, no murmurs.   ABDOMEN: Full, soft, non-tender, no palpable organomegaly, no palpable masses, normal bowel sounds.   LOWER EXTREMITIES: Minimal pitting edema, palpable peripheral pulses.   MUSCULOSKELETAL SYSTEM: Generalized osteoarthritic/rheumatoid changes, otherwise, normal.   CENTRAL NERVOUS SYSTEM: No focal neurologic deficit on gross examination.  LABS- CMP     Component Value Date/Time   NA 133* 11/10/2012 1641   K 2.8* 11/10/2012 1641   CL 98 11/10/2012 1641   CO2 29 11/10/2012 1641   GLUCOSE 94 11/10/2012 1641   BUN 7 11/10/2012 1641   CREATININE 0.8 11/10/2012 1641   CALCIUM 8.7 11/10/2012 1641   PROT 7.7 11/10/2012 1641   ALBUMIN 2.3* 11/10/2012 1641   AST 44* 11/10/2012 1641   ALT 24 11/10/2012 1641   ALKPHOS 73 11/10/2012 1641   BILITOT 0.7 11/10/2012 1641   GFRNONAA >90 10/26/2012 0450   GFRAA >90 10/26/2012 0450   CBC    Component Value Date/Time   WBC 10.7* 11/10/2012 1641   RBC 3.72* 11/10/2012 1641   HGB 10.7* 11/10/2012 1641   HCT 33.0* 11/10/2012 1641   PLT 274.0 11/10/2012 1641   MCV 88.7 11/10/2012 1641   MCH 29.4 10/26/2012 0450   MCHC 32.5 11/10/2012 1641   RDW 16.2* 11/10/2012 1641   LYMPHSABS 1.5 10/19/2012 0744   MONOABS 3.3* 10/19/2012  0744   EOSABS 0.2 10/19/2012 0744   BASOSABS 0.4* 10/19/2012 0744   Current Outpatient Prescriptions on File Prior to Visit  Medication Sig Dispense Refill  . allopurinol (ZYLOPRIM) 100 MG tablet Take 100 mg by mouth daily.      Marland Kitchen arformoterol (BROVANA) 15 MCG/2ML NEBU Take 2 mLs (15 mcg total) by nebulization 2 (two) times daily. Dx: 491.20  120 mL  6  . Ascorbic Acid (VITAMIN C) 1000 MG tablet Take 1,000 mg by mouth daily.      . budesonide (PULMICORT) 0.25 MG/2ML nebulizer solution Take 2 mLs (0.25 mg total) by nebulization 2 (two) times daily. Dx: 491.20  120 mL  6   . Cholecalciferol (VITAMIN D-3) 5000 UNITS TABS Take 5,000 Units by mouth daily.      . CYANOCOBALAMIN PO Take 1 tablet by mouth every Monday, Wednesday, and Friday.      . fluticasone (FLONASE) 50 MCG/ACT nasal spray Place 1 spray into both nostrils daily as needed for allergies.      . folic acid (FOLVITE) 1 MG tablet Take 2 mg by mouth daily.       . furosemide (LASIX) 40 MG tablet Take 40 mg by mouth daily.       Marland Kitchen levothyroxine (SYNTHROID, LEVOTHROID) 50 MCG tablet Take 50 mcg by mouth daily before breakfast.      . MAGNESIUM-ZINC PO Take 2 tablets by mouth daily.       . methotrexate (RHEUMATREX) 2.5 MG tablet Take 3 tablets (7.5 mg total) by mouth once a week. Thursdays  4 tablet  0  . montelukast (SINGULAIR) 10 MG tablet Take 1 tablet by mouth daily.      . Multiple Vitamin (MULTIVITAMIN) capsule Take 1 capsule by mouth daily.      Marland Kitchen nystatin cream (MYCOSTATIN) Apply 1 application topically daily as needed (under breasts).      Marland Kitchen oxyCODONE (OXY IR/ROXICODONE) 5 MG immediate release tablet Take 1 tablet (5 mg total) by mouth every 4 (four) hours as needed.  180 tablet  0  . potassium chloride SA (K-DUR,KLOR-CON) 20 MEQ tablet Take 2 tablets (40 mEq total) by mouth daily.  30 tablet  0  . tiotropium (SPIRIVA HANDIHALER) 18 MCG inhalation capsule Place 1 capsule (18 mcg total) into inhaler and inhale daily.  30 capsule  11  . benzonatate (TESSALON) 200 MG capsule Take 1 capsule (200 mg total) by mouth 3 (three) times daily.  21 capsule  0  . cetirizine (ZYRTEC) 10 MG tablet Take 1 tablet (10 mg total) by mouth daily.  30 tablet  0  . docusate sodium 100 MG CAPS Take 100 mg by mouth 2 (two) times daily.  10 capsule  0  . feeding supplement (RESOURCE BREEZE) LIQD Take 1 Container by mouth 3 (three) times daily between meals.  90 Container  0  . guaiFENesin (MUCINEX) 600 MG 12 hr tablet Take 1 tablet (600 mg total) by mouth 2 (two) times daily.  14 tablet  0  . pantoprazole (PROTONIX) 40 MG  tablet Take 1 tablet (40 mg total) by mouth 2 (two) times daily.  60 tablet  0  . STUDY MEDICATION Take 150 mg by mouth daily. Vertrex 104 Study for rheumatoid arthritis       No current facility-administered medications on file prior to visit.   Assessment/Plan    COPD, severe-  Patient had recent exacerbation in setting of pneumonia and has completed antibiotics and steroids. Continue oxygen supplementation and bronchodilators.  To follow with pulmonary    Chronic diastolic heart failure, NYHA class 1- stable currently. Monitor clinically. Weight monitor  RA- continue home medication- methotrexate with folic acid, no recent flare up  Leukocytosis-improved with wbc declining  hypothyroidisim- continue levothyroxine and monitor  Hypokalemia- will have her on kcl supplement 40 meq po daily. To have bmp checked in 1 week as outpatient  gerd- symptoms under control. Will not change meds  Will stop the premarin cream for now with improvement in her symptoms. Has completed all antibiotics, antifungal and steroid  Pt has made improvement and will be going home with script for a month on prescribed medication. She will need rollator walker on discharge. She will need home PT/OT and home health services. She will also need nebulizer machine

## 2012-11-11 NOTE — Assessment & Plan Note (Signed)
Asthmatic bronchitis with copd elements Plan Cont inhaled meds as Rx

## 2012-11-11 NOTE — Telephone Encounter (Signed)
Pt returned call & asked to be reached at [phone].  Jacqueline Washington

## 2012-11-11 NOTE — Telephone Encounter (Signed)
Per Dr. Delford Field: 1.  Pt's K was 2.8 yesterday.  I have already given VO to [place] at [facility] for K supplement on yesterday (see labs results from yesterday).  We need to call [facility] to inform them of pt's K level and have them f/u on this. 2.  Pt had CXR done yesterday with a right upper lobe consolidation.  Pt will be d/c'd from [facility] on Sunday.  Per PW: he will need to do a bronch sometime next week once pt is back at home.  We will need to try to get in touch with pt's husband or to pt to let them know this and also to have them call office at the beginning of the week to let us know a few days that would work.   ------  I called pt's home # - lmomtcb for pt's husband or for pt.  I called [employer] at [phone].  I had to lmomtcb for the nurse.  I left the [facility] office # for today so I could speak with her directly.

## 2012-11-11 NOTE — Assessment & Plan Note (Signed)
Potassium 2.8  Plan Repletion

## 2012-11-11 NOTE — Telephone Encounter (Signed)
Pt's lab results are as follows:  Notes Recorded by Storm Frisk, MD on 11/10/2012 at 5:40 PM Notify the patient her potassium level is low  Call the [facility] to give potassium once  Then continue current dosing -------  Called, spoke with pt.  I informed her of lab results and recs per Dr. Delford Field.  She is aware she will need to have labs redone and I have called [facility] to see if they can have this arranged. 1.  Pt states at home she was on 60 meq of K and is currently getting 2 tablets of this per day but is unsure of the strength she is now receiving.  Advised I would clarify this with the nurse.  She verbalized understanding. 2.  Pt states she can do the bronch any day next week that is good for PW.  She does prefer to have it in the morning.    I spoke with Shermine with [facility].  Was advised pt is on K 40 meq once daily.  I spoke with Dr. Delford Field about this and pt's home K dose.  Per PW:  Have pt to take an additional dose of 40 meq of K for a total of 80 meq today, then resume the 40 meq daily.  Shermine aware of this and verbalized understanding.  Shermine stated they draw labs themselves and we can give VO for this - no need to go through [employer].  Therefore, I gave VO to recheck pt's K on Saturday and fax results to [phone] Atnn: Dr. Shan Levans. Shermine verbalized understanding of these orders and voiced no further questions or concerns at this time.    Regarding Bronch:  I spoke with Dr. Delford Field.  Bronch is scheduled for Thursday, June 5 at 7:30 am at [facility].  Pt will need to arrive at 6:15 am to the Admitting Dept and be NPO after midnight.    I lmomtcb on [phone] for pt to inform her of bronch information and explain to her lab will be rechecked on Saturday and she will have 1 additional dose of K.

## 2012-11-11 NOTE — Progress Notes (Signed)
Quick Note:  Pt is aware of lab results and recs. Pls see phone msg from 11/11/12 for additional information. ______

## 2012-11-11 NOTE — Assessment & Plan Note (Signed)
RUL PNA  Dx 09/2012, very slow to resolve radiographically on 11/11/2012 Volume loss in RUL, concern re poss CA in pt with exsmoking and COPD Plan FOB in one week after d/c from SNF rehab No further ABX for now

## 2012-11-11 NOTE — Telephone Encounter (Signed)
Pls see the other phone msg from today for information.

## 2012-11-12 NOTE — Telephone Encounter (Signed)
Called, spoke with pt. Informed her of below regarding K, labs on Saturday, and bronch.   She verbalized understanding of all of instructions and voiced no further questions or concerns at this time.

## 2012-11-15 ENCOUNTER — Telehealth: Payer: Self-pay | Admitting: Critical Care Medicine

## 2012-11-15 MED ORDER — TIOTROPIUM BROMIDE MONOHYDRATE 18 MCG IN CAPS: 1.0000 | ORAL_CAPSULE | Freq: Every day | RESPIRATORY_TRACT | Status: DC

## 2012-11-15 MED ORDER — FLUTICASONE-SALMETEROL 500-50 MCG/DOSE IN AEPB: 1.0000 | INHALATION_SPRAY | Freq: Two times a day (BID) | RESPIRATORY_TRACT | Status: DC

## 2012-11-15 NOTE — Telephone Encounter (Signed)
Spoke with pt and notified of recs per PW She verbalized understanding and denied any questions  She prefers to go back to advair and spiriva  Rxs were sent to her pharm per her request

## 2012-11-15 NOTE — Telephone Encounter (Signed)
Only thing cheaper is to use albuterol in nebulizer 4 times daily I am ok with continuing inhalers

## 2012-11-15 NOTE — Telephone Encounter (Signed)
I spoke with the pt and she states that the nebulizer medications brovana and pulmicort are costing her $69 each a month. She states this is more expensive then the advair and spiriva which was costing her $45 each.  Pt is receiving these meds through [facility]. Pt is also taking albuterol neb twice daily. Pt wants to know if there are any cheaper neb options or can she go back on inhalers. Please advise. Carron Curie, CMA

## 2012-11-17 ENCOUNTER — Encounter (HOSPITAL_COMMUNITY): Payer: Self-pay

## 2012-11-18 ENCOUNTER — Encounter (HOSPITAL_COMMUNITY): Payer: Self-pay | Admitting: Critical Care Medicine

## 2012-11-18 ENCOUNTER — Ambulatory Visit (HOSPITAL_COMMUNITY)
Admission: RE | Admit: 2012-11-18 | Discharge: 2012-11-18 | Disposition: A | Discharge status: Home / Self Care | Payer: Medicare Other | Source: Ambulatory Visit | Attending: Critical Care Medicine | Admitting: Critical Care Medicine

## 2012-11-18 ENCOUNTER — Telehealth: Payer: Self-pay | Admitting: Critical Care Medicine

## 2012-11-18 ENCOUNTER — Encounter (HOSPITAL_COMMUNITY)
Admission: RE | Disposition: A | Discharge status: Home / Self Care | Payer: Self-pay | Source: Ambulatory Visit | Attending: Critical Care Medicine

## 2012-11-18 DIAGNOSIS — T17808A Unspecified foreign body in other parts of respiratory tract causing other injury, initial encounter: Secondary | ICD-10-CM | POA: Insufficient documentation

## 2012-11-18 DIAGNOSIS — Z79899 Other long term (current) drug therapy: Secondary | ICD-10-CM | POA: Insufficient documentation

## 2012-11-18 DIAGNOSIS — J189 Pneumonia, unspecified organism: Secondary | ICD-10-CM

## 2012-11-18 DIAGNOSIS — J449 Chronic obstructive pulmonary disease, unspecified: Secondary | ICD-10-CM | POA: Insufficient documentation

## 2012-11-18 DIAGNOSIS — J4489 Other specified chronic obstructive pulmonary disease: Secondary | ICD-10-CM | POA: Insufficient documentation

## 2012-11-18 DIAGNOSIS — T17408A Unspecified foreign body in trachea causing other injury, initial encounter: Secondary | ICD-10-CM | POA: Insufficient documentation

## 2012-11-18 DIAGNOSIS — J9819 Other pulmonary collapse: Secondary | ICD-10-CM | POA: Insufficient documentation

## 2012-11-18 DIAGNOSIS — Z8701 Personal history of pneumonia (recurrent): Secondary | ICD-10-CM | POA: Insufficient documentation

## 2012-11-18 DIAGNOSIS — IMO0002 Reserved for concepts with insufficient information to code with codable children: Secondary | ICD-10-CM | POA: Insufficient documentation

## 2012-11-18 HISTORY — PX: VIDEO BRONCHOSCOPY: SHX5072

## 2012-11-18 SURGERY — BRONCHOSCOPY, WITH FLUOROSCOPY
Anesthesia: Moderate Sedation

## 2012-11-18 MED ORDER — FENTANYL CITRATE 0.05 MG/ML IJ SOLN
INTRAMUSCULAR | Status: DC | PRN
  Administered 2012-11-18 (×2): 20 ug via INTRAVENOUS

## 2012-11-18 MED ORDER — SODIUM CHLORIDE 0.9 % IV SOLN
INTRAVENOUS | Status: DC
  Administered 2012-11-18: 08:00:00 via INTRAVENOUS

## 2012-11-18 MED ORDER — LIDOCAINE HCL 2 % EX GEL: Freq: Once | CUTANEOUS | Status: DC

## 2012-11-18 MED ORDER — LEVALBUTEROL HCL 0.63 MG/3ML IN NEBU
0.6300 mg | INHALATION_SOLUTION | Freq: Once | RESPIRATORY_TRACT | Status: DC
  Filled 2012-11-18: qty 3

## 2012-11-18 MED ORDER — PHENYLEPHRINE HCL 0.25 % NA SOLN: 1.0000 | Freq: Four times a day (QID) | NASAL | Status: DC | PRN

## 2012-11-18 MED ORDER — PHENYLEPHRINE HCL 0.25 % NA SOLN
NASAL | Status: DC | PRN
  Administered 2012-11-18: 1 via NASAL

## 2012-11-18 MED ORDER — LIDOCAINE HCL 2 % EX GEL
CUTANEOUS | Status: DC | PRN
  Administered 2012-11-18: 1

## 2012-11-18 MED ORDER — BUTAMBEN-TETRACAINE-BENZOCAINE 2-2-14 % EX AERO: 1.0000 | INHALATION_SPRAY | Freq: Once | CUTANEOUS | Status: DC

## 2012-11-18 MED ORDER — FENTANYL CITRATE 0.05 MG/ML IJ SOLN
INTRAMUSCULAR | Status: AC
  Filled 2012-11-18: qty 4

## 2012-11-18 MED ORDER — MIDAZOLAM HCL 10 MG/2ML IJ SOLN
INTRAMUSCULAR | Status: DC | PRN
  Administered 2012-11-18 (×3): 1 mg via INTRAVENOUS

## 2012-11-18 MED ORDER — LEVALBUTEROL HCL 1.25 MG/0.5ML IN NEBU
1.2500 mg | INHALATION_SOLUTION | Freq: Once | RESPIRATORY_TRACT | Status: AC
  Administered 2012-11-18: 1.25 mg via RESPIRATORY_TRACT

## 2012-11-18 MED ORDER — MIDAZOLAM HCL 10 MG/2ML IJ SOLN
INTRAMUSCULAR | Status: AC
  Filled 2012-11-18: qty 4

## 2012-11-18 MED ORDER — LIDOCAINE HCL 1 % IJ SOLN
INTRAMUSCULAR | Status: DC | PRN
  Administered 2012-11-18: 6 mL via RESPIRATORY_TRACT

## 2012-11-18 NOTE — Interval H&P Note (Signed)
There have been no interval changes in this patients medical hx or Px.  The pt is prepared and ready for FOB.  Jacqueline Washington Beeper  [phone]  Cell  [phone]  If no response or cell goes to voicemail, call beeper [phone]

## 2012-11-18 NOTE — Progress Notes (Addendum)
Video Bronchoscopy done  Intervention Bronchial washing done  Procedure tolerated well  I agree with above Luisa Hart WrightMD

## 2012-11-18 NOTE — Telephone Encounter (Signed)
Per PW: ok for pt to wait until the following wk.  We can work her in on June 17 at 1:30 pm at [facility].  Pls have pt call if anything is needed prior to this.  Thank you.

## 2012-11-18 NOTE — H&P (View-Only) (Signed)
Subjective:    Patient ID: Jacqueline Washington, female    DOB: 12/30/1950, 62 y.o.   MRN: [id number]  HPI  11/10/2012 Pt in hospital .  5/2-5/13   HCAP  Prior adm 09/2012 Hx of RUL PNA. Adm 09/2012 d/c only to return shortly thereafter 5/2- 10/26/12. Pt with resp failure on bipap. Pt rx further with steroids/ABX. Now is better. Pt now in snf rehab.  Notes low grade temp, minimal mucus. Dyspnea seems back to baseline. Temp never any higher than 99.  Review of Systems  Constitutional:   No  weight loss, night sweats,  Fevers, chills,  +fatigue, or  lassitude.  HEENT:   No headaches,  Difficulty swallowing,  Tooth/dental problems, or  Sore throat,                No sneezing, itching, ear ache,  +nasal congestion, post nasal drip,   CV:  No chest pain,  Orthopnea, PND, swelling in lower extremities, anasarca, dizziness, palpitations, syncope.   GI  No heartburn, indigestion, abdominal pain, nausea, vomiting, diarrhea, change in bowel habits, loss of appetite, bloody stools.   Resp:   No wheezing.  No chest wall deformity  Skin: no rash or lesions.  GU: no dysuria, change in color of urine, no urgency or frequency.  No flank pain, no hematuria   MS:    No decreased range of motion.  No back pain.  Psych:  No change in mood or affect. No depression or anxiety.  No memory loss.      Objective:   Physical Exam BP 110/72  Pulse 120  Temp(Src) 98.1 F (36.7 C) (Oral)  Ht 5' 4" (1.626 m)  Wt 205 lb 9.6 oz (93.26 kg)  BMI 35.27 kg/m2  SpO2 95%  GEN: A/Ox3; pleasant , NAD   HEENT:  [place]/AT,  EACs-clear, TMs-wnl, NOSE-clear, THROAT-clear, no lesions, no postnasal drip or exudate noted.   NECK:  Supple w/ fair ROM; no JVD; normal carotid impulses w/o bruits; no thyromegaly or nodules palpated; no lymphadenopathy.  RESP: Few rhonchi  w/o, wheezes/ rales/ or rhonchi.no accessory muscle use, no dullness to percussion  CARD:  RRR, no m/r/g  , 1+  peripheral edema, pulses intact, no cyanosis  or clubbing.  GI:   Soft & nt; nml bowel sounds; no organomegaly or masses detected.  Musco: Warm bil   Neuro: alert, no focal deficits noted.    Skin: Warm, no lesions or rashes   Recent Labs Lab 11/10/12 1641  NA 133*  K 2.8*  CL 98  CO2 29  GLUCOSE 94  BUN 7  CREATININE 0.8  CALCIUM 8.7    Dg Chest 2 View  11/10/2012   *RADIOLOGY REPORT*  Clinical Data: Evaluate for pneumonia  CHEST - 2 VIEW  Comparison: 10/23/2012  Findings: Heart size appears normal.  There is chronic interstitial coarsening identified throughout both lungs compatible with chronic lung disease.  Dense airspace consolidation within the right upper lobe is again identified.  This appears slightly improved in aeration to previous exam.  IMPRESSION:  1.  Dense right upper lobe airspace consolidation.  This is slightly improved from previous exam. 2.  Chronic lung disease.   Original Report Authenticated By: Taylor Stroud, M.D.       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 allopurinol (ZYLOPRIM) 100 MG tablet Take 100 mg by mouth daily.      . arformoterol (BROVANA) 15 MCG/2ML NEBU Take 2 mLs (15 mcg total) by nebulization 2 (two) times daily. Dx: 491.20  120 mL  6  . Ascorbic Acid (VITAMIN C) 1000 MG tablet Take 1,000 mg by mouth daily.      . benzonatate (TESSALON) 200 MG capsule Take 1 capsule (200 mg total) by mouth 3 (three) times daily.  21 capsule  0  . budesonide (PULMICORT) 0.25 MG/2ML nebulizer solution Take 2 mLs  (0.25 mg total) by nebulization 2 (two) times daily. Dx: 491.20  120 mL  6  . cetirizine (ZYRTEC) 10 MG tablet Take 1 tablet (10 mg total) by mouth daily.  30 tablet  0  . Cholecalciferol (VITAMIN D-3) 5000 UNITS TABS Take 5,000 Units by mouth daily.      . CYANOCOBALAMIN PO Take 1 tablet by mouth every Monday, Wednesday, and Friday.      . docusate sodium 100 MG CAPS Take 100 mg by mouth 2 (two) times daily.  10 capsule  0  . feeding supplement (RESOURCE BREEZE) LIQD Take 1 Container by mouth 3 (three) times daily between meals.  90 Container  0  . fluticasone (FLONASE) 50 MCG/ACT nasal spray Place 1 spray into both nostrils daily as needed for allergies.      . folic acid (FOLVITE) 1 MG tablet Take 2 mg by mouth daily.       . furosemide (LASIX) 40 MG tablet Take 40 mg by mouth daily.       . guaiFENesin (MUCINEX) 600 MG 12 hr tablet Take 1 tablet (600 mg total) by mouth 2 (two) times daily.  14 tablet  0  . levalbuterol (XOPENEX) 0.63 MG/3ML nebulizer solution Take 3 mLs (0.63 mg total) by nebulization every 4 (four) hours as needed for wheezing or shortness of breath.  3 mL  0  . levalbuterol (XOPENEX) 0.63 MG/3ML nebulizer solution Take 3 mLs (0.63 mg total) by nebulization every 6 (six) hours.  3 mL  0  . levothyroxine (SYNTHROID, LEVOTHROID) 50 MCG tablet Take 50 mcg by mouth daily before breakfast.      . MAGNESIUM-ZINC PO Take 2 tablets by mouth daily.       . methotrexate (RHEUMATREX) 2.5 MG tablet Take 3 tablets (7.5 mg total) by mouth once a week. Thursdays  4 tablet  0  . montelukast (SINGULAIR) 10 MG tablet Take 1 tablet by mouth daily.      . Multiple Vitamin (MULTIVITAMIN) capsule Take 1 capsule by mouth daily.      . nystatin cream (MYCOSTATIN) Apply 1 application topically daily as needed (under breasts).      . pantoprazole (PROTONIX) 40 MG tablet Take 1 tablet (40 mg total) by mouth 2 (two) times daily.  60 tablet  0  . potassium chloride SA (K-DUR,KLOR-CON) 20 MEQ tablet Take  2 tablets (40 mEq total) by mouth daily.  30 tablet  0  . predniSONE (DELTASONE) 10 MG tablet Take 1 tablet (10 mg total) by mouth daily with breakfast.  3 tablet  0  . sodium chloride (OCEAN) 0.65 % nasal spray   Place 1 spray into both nostrils daily as needed for congestion.      . tiotropium (SPIRIVA HANDIHALER) 18 MCG inhalation capsule Place 1 capsule (18 mcg total) into inhaler and inhale daily.  30 capsule  11  . oxyCODONE (OXY IR/ROXICODONE) 5 MG immediate release tablet Take 1 tablet (5 mg total) by mouth every 4 (four) hours as needed.  180 tablet  0  . STUDY MEDICATION Take 150 mg by mouth daily. Vertrex 104 Study for rheumatoid arthritis       No facility-administered encounter medications on file as of 11/10/2012.      

## 2012-11-18 NOTE — Telephone Encounter (Signed)
I spoke with pt and appt scheduled. Nothing further was needed

## 2012-11-18 NOTE — Op Note (Signed)
Bronchoscopy Procedure Note  Date of Operation: 11/18/2012  Pre-op Diagnosis: RUL collapse, PNA recurrent  Post-op Diagnosis: Same, mucus plugging RUL  Surgeon: Shan Levans  Anesthesia: Monitored Local Anesthesia with Sedation  Operation: Flexible fiberoptic bronchoscopy, diagnostic   Findings: Mucus plugging RUL  Specimen: Bronchial washings  Estimated Blood Loss: None  Complications: none  Indications and History: The patient is a 62 y.o. female with RUL PNA recurrent and collapse.  The risks, benefits, complications, treatment options and expected outcomes were discussed with the patient.  The possibilities of reaction to medication, pulmonary aspiration, perforation of a viscus, bleeding, failure to diagnose a condition and creating a complication requiring transfusion or operation were discussed with the patient who freely signed the consent.    Description of Procedure: The patient was re-examined in the bronchoscopy suite and the site of surgery properly noted/marked.  The patient was identified as Jacqueline Washington and the procedure verified as Flexible Fiberoptic Bronchoscopy.  A Time Out was held and the above information confirmed.   After the induction of topical nasopharyngeal anesthesia, the patient was positioned  and the bronchoscope was passed through the R nares. The vocal cords were visualized and  1% buffered lidocaine 5 ml was topically placed onto the cords. The cords were normal. The scope was then passed into the trachea.  1% buffered lidocaine 5 ml was used topically on the carina.  Careful inspection of the tracheal lumen was accomplished. The scope was sequentially passed into the left main and then left upper and lower bronchi and segmental bronchi.     The scope was then withdrawn and advanced into the right main bronchus and then into the RUL, RML, and RLL bronchi and segmental bronchi.   Bronchial washings RUL  was done and there was one specimen.    Endobronchial findings: Mucus plugging RUL, no endobronchial lesions. Extensive tracheobronchitis Trachea: Edematous mucosa Carina: Edematous mucosa Right main bronchus: edematous mucosa Right upper lobe bronchus: Collapse with mucus plugging Right middle lobe bronchus: Edematous mucosa Right lower lobe bronchus: Edematous mucosa Left main bronchus: Edematous mucosa Left upper lobe bronchus: Edematous mucosa Left lower lobe bronchus: Edematous mucosa  The Patient was taken to the Endoscopy Recovery area in satisfactory condition.  Attestation: I performed the procedure.  Dorcas Carrow Beeper  [phone]  Cell  [phone]  If no response or cell goes to voicemail, call beeper [phone]

## 2012-11-21 LAB — CULTURE, RESPIRATORY W GRAM STAIN: Culture: NORMAL

## 2012-11-22 ENCOUNTER — Encounter (HOSPITAL_COMMUNITY): Payer: Self-pay | Admitting: Critical Care Medicine

## 2012-11-22 NOTE — Telephone Encounter (Signed)
K results received from [facility].  K was 3.7 on 11/13/12.  Results placed in PWs folder to review when he returns to office next week.

## 2012-11-22 NOTE — Progress Notes (Signed)
Quick Note:  Call pt and tell her all cultures from bronch last Thursday are normal No CAncer, no need for additional antibiotics ______

## 2012-11-23 NOTE — Progress Notes (Signed)
Quick Note:  Called, spoke with pt. Informed her of bronch culture results and recs per Dr. Delford Field. She verbalized understanding of this and voiced no further questions or concerns at this time. ______

## 2012-11-24 LAB — LEGIONELLA CULTURE

## 2012-11-30 ENCOUNTER — Other Ambulatory Visit: Payer: Medicare Other

## 2012-11-30 ENCOUNTER — Ambulatory Visit (INDEPENDENT_AMBULATORY_CARE_PROVIDER_SITE_OTHER)
Admission: RE | Admit: 2012-11-30 | Discharge: 2012-11-30 | Disposition: A | Discharge status: Home / Self Care | Payer: Medicare Other | Source: Ambulatory Visit | Attending: Critical Care Medicine | Admitting: Critical Care Medicine

## 2012-11-30 ENCOUNTER — Ambulatory Visit (INDEPENDENT_AMBULATORY_CARE_PROVIDER_SITE_OTHER): Payer: Medicare Other | Admitting: Critical Care Medicine

## 2012-11-30 ENCOUNTER — Encounter: Payer: Self-pay | Admitting: Critical Care Medicine

## 2012-11-30 VITALS — BP 122/72 | HR 105 | Temp 98.3°F | Ht 64.0 in | Wt 199.0 lb

## 2012-11-30 DIAGNOSIS — J189 Pneumonia, unspecified organism: Secondary | ICD-10-CM

## 2012-11-30 MED ORDER — ONDANSETRON HCL 4 MG PO TABS: 4.0000 mg | ORAL_TABLET | Freq: Three times a day (TID) | ORAL | Status: DC | PRN

## 2012-11-30 MED ORDER — TIOTROPIUM BROMIDE MONOHYDRATE 18 MCG IN CAPS: ORAL_CAPSULE | RESPIRATORY_TRACT | Status: DC

## 2012-11-30 MED ORDER — ALBUTEROL SULFATE (2.5 MG/3ML) 0.083% IN NEBU: 2.5000 mg | INHALATION_SOLUTION | RESPIRATORY_TRACT | Status: DC | PRN

## 2012-11-30 MED ORDER — OMEPRAZOLE 20 MG PO CPDR: 20.0000 mg | DELAYED_RELEASE_CAPSULE | Freq: Every day | ORAL | Status: DC

## 2012-11-30 MED ORDER — FLUTICASONE-SALMETEROL 250-50 MCG/DOSE IN AEPB: INHALATION_SPRAY | RESPIRATORY_TRACT | Status: DC

## 2012-11-30 NOTE — Progress Notes (Signed)
Subjective:    Patient ID: Jacqueline Washington, female    DOB: March 11, 1951, 62 y.o.   MRN: [id number]  HPI  11/30/2012 Coughed up blood this am with mucus.  Mucus dark material No real chest pain. Runs low grade temp. SOme pain in RUL area.   Dyspnea about the same.  Pt is ambulatory at home   Review of Systems  Constitutional:   No  weight loss, night sweats,  Fevers, chills,  +fatigue, or  lassitude.  HEENT:   No headaches,  Difficulty swallowing,  Tooth/dental problems, or  Sore throat,                No sneezing, itching, ear ache,  +nasal congestion, post nasal drip,   CV:  No chest pain,  Orthopnea, PND, swelling in lower extremities, anasarca, dizziness, palpitations, syncope.   GI  No heartburn, indigestion, abdominal pain, nausea, vomiting, diarrhea, change in bowel habits, loss of appetite, bloody stools.   Resp:   No wheezing.  No chest wall deformity  Skin: no rash or lesions.  GU: no dysuria, change in color of urine, no urgency or frequency.  No flank pain, no hematuria   MS:    No decreased range of motion.  No back pain.  Psych:  No change in mood or affect. No depression or anxiety.  No memory loss.      Objective:   Physical Exam BP 122/72  Pulse 105  Temp(Src) 98.3 F (36.8 C) (Oral)  Ht 5\' 4"  (1.626 m)  Wt 199 lb (90.266 kg)  BMI 34.14 kg/m2  SpO2 97%  GEN: A/Ox3; pleasant , NAD   HEENT:  [place]/AT,  EACs-clear, TMs-wnl, NOSE-clear, THROAT-clear, no lesions, no postnasal drip or exudate noted.   NECK:  Supple w/ fair ROM; no JVD; normal carotid impulses w/o bruits; no thyromegaly or nodules palpated; no lymphadenopathy.  RESP: Few rhonchi  w/o, wheezes/ rales/ or rhonchi.no accessory muscle use, no dullness to percussion  CARD:  RRR, no m/r/g  , 1+  peripheral edema, pulses intact, no cyanosis or clubbing.  GI:   Soft & nt; nml bowel sounds; no organomegaly or masses detected.  Musco: Warm bil   Neuro: alert, no focal deficits noted.    Skin:  Warm, no lesions or rashes  No results found for this basename: NA, K, CL, CO2, GLUCOSE, BUN, CREATININE, CALCIUM, MG, PHOS,  in the last 168 hours  Dg Chest 2 View  11/30/2012   *RADIOLOGY REPORT*  Clinical Data: Follow up for pneumonia.  CHEST - 2 VIEW  Comparison: Multiple previous studies including the examinations of 10/23/2012 and 11/10/2012.  Findings: The heart remains normal in size.  Interstitial changes in the left lung persist and are stable.  There has been no real change in the appearance of the right lung.  There is a diffuse parenchymal consolidative opacity involving the right upper lobe with upward retraction of the minor fissure and overlying pleural thickening.  Strand-like densities extend in a linear fashion toward the right hemidiaphragm which appears slightly elevated.  In the region of the right hilum, the densities have somewhat rounded or convex margins laterally and there is evident elevation of the right main bronchus.  Overall little change has occurred from the prior study.  There are no acute bony changes.  IMPRESSION: Little change compared to preceding study. Since the first, abnormal chest x-ray there has been considerable clearing of the right lower lobe; however, the right hilum and right upper lobe  showed persistent consolidated changes with progressive volume loss in the right upper lobe. This volume loss has remained stable over the last two chest x-rays.  This may be due to a generalized scarring.  Other causes for such a pneumonia with cicritization should also be considered including tuberculosis.  I would repeat the CT examination as well to exclude an endobronchial lesion, abscess formation or other complicating features   Original Report Authenticated By: Sander Radon, M.D.       Assessment & Plan:   Pneumonia Slow to resolve right upper lobe pneumonia diagnosed April 2014 Chest x-ray shows gradual improvement on current visit Continued airway  obstruction  The patient has undergone bronchoscopy which did not show endobronchial lesion right upper lobe  Plan Continued observation of right upper lobe When current Brovana and Budesonide supply runs out , stop USe albuterol as needed  Resume Advair twice daily and Spiriva daily when off nebulizer medications Omeprazole 20mg  daily  Zofran 4mg  every 6 hours as needed for nausea Return 4 weeks     Updated Medication List Outpatient Encounter Prescriptions as of 11/30/2012  Medication Sig Dispense Refill  . albuterol (PROVENTIL) (2.5 MG/3ML) 0.083% nebulizer solution Take 3 mLs (2.5 mg total) by nebulization every 4 (four) hours as needed for wheezing or shortness of breath.  75 mL    . allopurinol (ZYLOPRIM) 100 MG tablet Take 100 mg by mouth daily.      Marland Kitchen arformoterol (BROVANA) 15 MCG/2ML NEBU Take 15 mcg by nebulization 2 (two) times daily.      . Ascorbic Acid (VITAMIN C) 1000 MG tablet Take 1,000 mg by mouth daily.      . benzonatate (TESSALON) 200 MG capsule Take 1 capsule (200 mg total) by mouth 3 (three) times daily.  21 capsule  0  . budesonide (PULMICORT) 0.25 MG/2ML nebulizer solution Take 0.25 mg by nebulization 2 (two) times daily.      . cetirizine (ZYRTEC) 10 MG tablet Take 1 tablet (10 mg total) by mouth daily.  30 tablet  0  . Cholecalciferol (VITAMIN D-3) 5000 UNITS TABS Take 5,000 Units by mouth daily.      . CYANOCOBALAMIN PO Take 1 tablet by mouth every Monday, Wednesday, and Friday.      . docusate sodium 100 MG CAPS Take 100 mg by mouth 2 (two) times daily.  10 capsule  0  . feeding supplement (RESOURCE BREEZE) LIQD Take 1 Container by mouth 3 (three) times daily between meals.  90 Container  0  . fluticasone (FLONASE) 50 MCG/ACT nasal spray Place 1 spray into both nostrils daily as needed for allergies.      . folic acid (FOLVITE) 1 MG tablet Take 2 mg by mouth daily.       . furosemide (LASIX) 40 MG tablet Take 40 mg by mouth daily.       Marland Kitchen guaiFENesin  (MUCINEX) 600 MG 12 hr tablet Take 1 tablet (600 mg total) by mouth 2 (two) times daily.  14 tablet  0  . levothyroxine (SYNTHROID, LEVOTHROID) 50 MCG tablet Take 50 mcg by mouth daily before breakfast.      . MAGNESIUM-ZINC PO Take 2 tablets by mouth daily.       . montelukast (SINGULAIR) 10 MG tablet Take 1 tablet by mouth daily.      . Multiple Vitamin (MULTIVITAMIN) capsule Take 1 capsule by mouth daily.      Marland Kitchen nystatin cream (MYCOSTATIN) Apply 1 application topically daily as needed (under breasts).      Marland Kitchen  potassium chloride SA (K-DUR,KLOR-CON) 20 MEQ tablet Take 60 mEq by mouth daily.      . [DISCONTINUED] albuterol (PROVENTIL) (2.5 MG/3ML) 0.083% nebulizer solution Take 2.5 mg by nebulization 4 (four) times daily.      . [DISCONTINUED] levalbuterol (XOPENEX) 0.63 MG/3ML nebulizer solution Take 0.63 mg by nebulization every 4 (four) hours as needed.      . [DISCONTINUED] levalbuterol (XOPENEX) 0.63 MG/3ML nebulizer solution Take 0.63 mg by nebulization 4 (four) times daily. And every 4 hour as needed      . [DISCONTINUED] metoCLOPramide (REGLAN) 10 MG tablet Take 10 mg by mouth 2 (two) times daily.      . [DISCONTINUED] omeprazole (PRILOSEC) 20 MG capsule Take 20 mg by mouth 2 (two) times daily.      . [DISCONTINUED] oxyCODONE (OXY IR/ROXICODONE) 5 MG immediate release tablet Take 1 tablet (5 mg total) by mouth every 4 (four) hours as needed.  180 tablet  0  . [DISCONTINUED] pantoprazole (PROTONIX) 40 MG tablet Take 1 tablet (40 mg total) by mouth 2 (two) times daily.  60 tablet  0  . [DISCONTINUED] potassium chloride SA (K-DUR,KLOR-CON) 20 MEQ tablet Take 2 tablets (40 mEq total) by mouth daily.  30 tablet  0  . [DISCONTINUED] STUDY MEDICATION Take 150 mg by mouth daily. Vertrex 104 Study for rheumatoid arthritis      . Fluticasone-Salmeterol (ADVAIR) 250-50 MCG/DOSE AEPB Resume when brovana and budesonide run out  60 each    . omeprazole (PRILOSEC) 20 MG capsule Take 1 capsule (20 mg total)  by mouth daily.  30 capsule  4  . ondansetron (ZOFRAN) 4 MG tablet Take 1 tablet (4 mg total) by mouth every 8 (eight) hours as needed for nausea.  20 tablet  0  . tiotropium (SPIRIVA HANDIHALER) 18 MCG inhalation capsule Resume  30 capsule  5  . [DISCONTINUED] Fluticasone-Salmeterol (ADVAIR DISKUS) 500-50 MCG/DOSE AEPB Inhale 1 puff into the lungs 2 (two) times daily.  60 each  5  . [DISCONTINUED] Fluticasone-Salmeterol (ADVAIR) 250-50 MCG/DOSE AEPB Inhale 1 puff into the lungs every 12 (twelve) hours.      . [DISCONTINUED] tiotropium (SPIRIVA HANDIHALER) 18 MCG inhalation capsule Place 1 capsule (18 mcg total) into inhaler and inhale daily.  30 capsule  5   No facility-administered encounter medications on file as of 11/30/2012.

## 2012-11-30 NOTE — Assessment & Plan Note (Signed)
Slow to resolve right upper lobe pneumonia diagnosed April 2014 Chest x-ray shows gradual improvement on current visit Continued airway obstruction Plan When current Brovana and Budesonide supply runs out , stop USe albuterol as needed  Resume Advair twice daily and Spiriva daily when off nebulizer medications Omeprazole 20mg  daily  Zofran 4mg  every 6 hours as needed for nausea Return 4 weeks

## 2012-11-30 NOTE — Patient Instructions (Addendum)
When current Brovana and Budesonide supply runs out , stop USe albuterol as needed  Resume Advair twice daily and Spiriva daily when off nebulizer medications Omeprazole 20mg  daily  Zofran 4mg  every 6 hours as needed for nausea Labs today and Chest xray today Return 4 weeks

## 2012-12-01 NOTE — Progress Notes (Signed)
Quick Note:  Notify the patient that the Xray is stable and pneumonia is slowly resolving. No change in medications are recommended. Continue current meds as prescribed at last office visit ______

## 2012-12-01 NOTE — Progress Notes (Signed)
Quick Note:  Called, spoke with pt. Informed her of cxr results and recs per Dr. Wright. She verbalized understanding and voiced no further questions or concerns at this time. ______ 

## 2012-12-10 ENCOUNTER — Other Ambulatory Visit (HOSPITAL_COMMUNITY): Payer: Self-pay | Admitting: Critical Care Medicine

## 2012-12-20 ENCOUNTER — Ambulatory Visit (INDEPENDENT_AMBULATORY_CARE_PROVIDER_SITE_OTHER): Payer: Medicare Other | Admitting: Critical Care Medicine

## 2012-12-20 ENCOUNTER — Encounter: Payer: Self-pay | Admitting: Critical Care Medicine

## 2012-12-20 VITALS — BP 106/64 | HR 99 | Temp 98.4°F | Ht 64.0 in | Wt 194.2 lb

## 2012-12-20 DIAGNOSIS — J449 Chronic obstructive pulmonary disease, unspecified: Secondary | ICD-10-CM

## 2012-12-20 MED ORDER — BUDESONIDE 0.25 MG/2ML IN SUSP: RESPIRATORY_TRACT | Status: DC

## 2012-12-20 MED ORDER — ARFORMOTEROL TARTRATE 15 MCG/2ML IN NEBU: INHALATION_SOLUTION | RESPIRATORY_TRACT | Status: DC

## 2012-12-20 MED ORDER — FLUTICASONE-SALMETEROL 500-50 MCG/DOSE IN AEPB: 1.0000 | INHALATION_SPRAY | Freq: Two times a day (BID) | RESPIRATORY_TRACT | Status: DC

## 2012-12-20 MED ORDER — TIOTROPIUM BROMIDE MONOHYDRATE 18 MCG IN CAPS: ORAL_CAPSULE | RESPIRATORY_TRACT | Status: DC

## 2012-12-20 NOTE — Progress Notes (Signed)
Subjective:    Patient ID: Jacqueline Washington, female    DOB: 01-30-1951, 62 y.o.   MRN: [id number]  HPI  12/20/2012 Chief Complaint  Patient presents with  . 1 month follow up    Breathing has improved some but still has SOB when moving, hacky cough, and vomiting at times from coughing.  No wheezing.  no real chest pain. Still notes cough and dyspnea.  No real fever.   No wheezing.  Doing some walking  Stays tired   Past Medical History  Diagnosis Date  . Asthma   . Rheumatoid arthritis(714.0)   . Emphysema     02 dependent 3L/min  . Emphysema   . Pneumonia   . Hypothyroidism   . Diabetes mellitus, type II   . Obesity   . Diastolic heart failure 09/22/2012    Grade 1     Family History  Problem Relation Age of Onset  . Asthma Mother   . Heart disease Mother   . Clotting disorder Mother   . Colon cancer Neg Hx   . Heart attack Father      History   Social History  . Marital Status: Married    Spouse Name: N/A    Number of Children: 2  . Years of Education: N/A   Occupational History  .     Social History Main Topics  . Smoking status: Former Smoker -- 3.00 packs/day for 43 years    Types: Cigarettes    Quit date: 04/24/2007  . Smokeless tobacco: Never Used     Comment: electronic cigarette daily  . Alcohol Use: Yes     Comment: occasional  . Drug Use: No  . Sexually Active: No   Other Topics Concern  . Not on file   Social History Narrative  . No narrative on file     Allergies  Allergen Reactions  . Penicillins Rash  . Sulfonamide Derivatives Rash  . Tetanus Toxoid Other (See Comments)    Knot on arm     Outpatient Prescriptions Prior to Visit  Medication Sig Dispense Refill  . albuterol (PROVENTIL) (2.5 MG/3ML) 0.083% nebulizer solution Take 3 mLs (2.5 mg total) by nebulization every 4 (four) hours as needed for wheezing or shortness of breath.  75 mL    . allopurinol (ZYLOPRIM) 100 MG tablet Take 100 mg by mouth daily.      . Ascorbic Acid  (VITAMIN C) 1000 MG tablet Take 1,000 mg by mouth daily.      . cetirizine (ZYRTEC) 10 MG tablet TAKE 1 TABLET EVERY DAY  30 tablet  0  . Cholecalciferol (VITAMIN D-3) 5000 UNITS TABS Take 5,000 Units by mouth daily.      . CYANOCOBALAMIN PO Take 1 tablet by mouth every Monday, Wednesday, and Friday.      . docusate sodium 100 MG CAPS Take 100 mg by mouth 2 (two) times daily.  10 capsule  0  . fluticasone (FLONASE) 50 MCG/ACT nasal spray Place 1 spray into both nostrils daily as needed for allergies.      . folic acid (FOLVITE) 1 MG tablet Take 2 mg by mouth daily.       . furosemide (LASIX) 40 MG tablet Take 40 mg by mouth daily.       Marland Kitchen guaiFENesin (MUCINEX) 600 MG 12 hr tablet Take 1 tablet (600 mg total) by mouth 2 (two) times daily.  14 tablet  0  . levothyroxine (SYNTHROID, LEVOTHROID) 50 MCG tablet Take 50  mcg by mouth daily before breakfast.      . MAGNESIUM-ZINC PO Take 2 tablets by mouth daily.       . montelukast (SINGULAIR) 10 MG tablet Take 1 tablet by mouth daily.      . Multiple Vitamin (MULTIVITAMIN) capsule Take 1 capsule by mouth daily.      Marland Kitchen nystatin cream (MYCOSTATIN) Apply 1 application topically daily as needed (under breasts).      Marland Kitchen omeprazole (PRILOSEC) 20 MG capsule Take 1 capsule (20 mg total) by mouth daily.  30 capsule  4  . ondansetron (ZOFRAN) 4 MG tablet Take 1 tablet (4 mg total) by mouth every 8 (eight) hours as needed for nausea.  20 tablet  0  . potassium chloride SA (K-DUR,KLOR-CON) 20 MEQ tablet Take 60 mEq by mouth daily.      Marland Kitchen arformoterol (BROVANA) 15 MCG/2ML NEBU Take 15 mcg by nebulization 2 (two) times daily.      . budesonide (PULMICORT) 0.25 MG/2ML nebulizer solution Take 0.25 mg by nebulization 2 (two) times daily.      . benzonatate (TESSALON) 200 MG capsule Take 1 capsule (200 mg total) by mouth 3 (three) times daily.  21 capsule  0  . feeding supplement (RESOURCE BREEZE) LIQD Take 1 Container by mouth 3 (three) times daily between meals.  90  Container  0  . Fluticasone-Salmeterol (ADVAIR) 250-50 MCG/DOSE AEPB Resume when brovana and budesonide run out  60 each    . tiotropium (SPIRIVA HANDIHALER) 18 MCG inhalation capsule Resume  30 capsule  5   No facility-administered medications prior to visit.     Review of Systems  Constitutional:   No  weight loss, night sweats,  Fevers, chills,  +fatigue, or  lassitude.  HEENT:   No headaches,  Difficulty swallowing,  Tooth/dental problems, or  Sore throat,                No sneezing, itching, ear ache,  +nasal congestion, post nasal drip,   CV:  No chest pain,  Orthopnea, PND, swelling in lower extremities, anasarca, dizziness, palpitations, syncope.   GI  No heartburn, indigestion, abdominal pain, nausea, vomiting, diarrhea, change in bowel habits, loss of appetite, bloody stools.   Resp:   No wheezing.  No chest wall deformity  Skin: no rash or lesions.  GU: no dysuria, change in color of urine, no urgency or frequency.  No flank pain, no hematuria   MS:    No decreased range of motion.  No back pain.  Psych:  No change in mood or affect. No depression or anxiety.  No memory loss.      Objective:   Physical Exam BP 106/64  Pulse 99  Temp(Src) 98.4 F (36.9 C) (Oral)  Ht 5\' 4"  (1.626 m)  Wt 88.089 kg (194 lb 3.2 oz)  BMI 33.32 kg/m2  SpO2 94%  GEN: A/Ox3; pleasant , NAD   HEENT:  [place]/AT,  EACs-clear, TMs-wnl, NOSE-clear, THROAT-clear, no lesions, no postnasal drip or exudate noted.   NECK:  Supple w/ fair ROM; no JVD; normal carotid impulses w/o bruits; no thyromegaly or nodules palpated; no lymphadenopathy.  RESP: Few rhonchi  w/o, wheezes/ rales/ or rhonchi.no accessory muscle use, no dullness to percussion Consolidative BS RUL  CARD:  RRR, no m/r/g  , 1+  peripheral edema, pulses intact, no cyanosis or clubbing.  GI:   Soft & nt; nml bowel sounds; no organomegaly or masses detected.  Musco: Warm bil   Neuro: alert,  no focal deficits noted.    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   Chronic Atelectasis RUL s/p HCAP Plan When current supply of brovana and budesonide run out start Advair 500 one puff twice daily and Spiriva daily Stay on oxygen Use albuterol as needed No further ABX Return 3 months    Updated Medication List Outpatient Encounter Prescriptions as of 12/20/2012  Medication Sig Dispense Refill  . albuterol (PROVENTIL) (2.5 MG/3ML) 0.083% nebulizer solution Take 3 mLs (2.5 mg total) by nebulization every 4 (four) hours as needed for wheezing or shortness of breath.  75 mL    . allopurinol (ZYLOPRIM) 100 MG tablet Take 100 mg by mouth daily.      Marland Kitchen arformoterol (BROVANA) 15 MCG/2ML NEBU Stop when current supply runs out  120 mL    . Ascorbic Acid (VITAMIN C) 1000 MG tablet Take 1,000 mg by mouth daily.      . budesonide (PULMICORT) 0.25 MG/2ML nebulizer solution Stop when current supply runs out  60 mL    . cetirizine (ZYRTEC) 10 MG tablet TAKE 1 TABLET EVERY DAY  30 tablet  0  . Cholecalciferol (VITAMIN D-3) 5000 UNITS TABS Take 5,000 Units by mouth daily.      . CYANOCOBALAMIN PO Take 1 tablet by mouth every Monday, Wednesday, and Friday.      . docusate sodium 100 MG CAPS Take 100 mg by mouth 2 (two) times daily.  10 capsule  0  . fluticasone (FLONASE) 50 MCG/ACT nasal spray Place 1 spray into both nostrils daily as needed for allergies.      . folic acid (FOLVITE) 1 MG tablet Take 2 mg by mouth daily.       . furosemide (LASIX) 40 MG tablet Take 40 mg by mouth daily.       Marland Kitchen guaiFENesin (MUCINEX) 600 MG 12 hr tablet Take 1 tablet (600 mg total) by mouth 2 (two) times daily.  14 tablet  0  . levothyroxine (SYNTHROID, LEVOTHROID) 50 MCG tablet Take 50 mcg by mouth daily before breakfast.      . MAGNESIUM-ZINC PO Take 2 tablets by mouth daily.       . montelukast (SINGULAIR) 10 MG tablet Take 1 tablet by mouth daily.      . Multiple  Vitamin (MULTIVITAMIN) capsule Take 1 capsule by mouth daily.      Marland Kitchen nystatin cream (MYCOSTATIN) Apply 1 application topically daily as needed (under breasts).      Marland Kitchen omeprazole (PRILOSEC) 20 MG capsule Take 1 capsule (20 mg total) by mouth daily.  30 capsule  4  . ondansetron (ZOFRAN) 4 MG tablet Take 1 tablet (4 mg total) by mouth every 8 (eight) hours as needed for nausea.  20 tablet  0  . potassium chloride SA (K-DUR,KLOR-CON) 20 MEQ tablet Take 60 mEq by mouth daily.      . [DISCONTINUED] arformoterol (BROVANA) 15 MCG/2ML NEBU Take 15 mcg by nebulization 2 (two) times daily.      . [DISCONTINUED] budesonide (PULMICORT) 0.25 MG/2ML nebulizer solution Take 0.25 mg by nebulization 2 (two) times daily.      . benzonatate (TESSALON) 200 MG capsule Take 1 capsule (200 mg total) by mouth 3 (three) times daily.  21 capsule  0  . feeding supplement (RESOURCE BREEZE) LIQD Take 1 Container by mouth 3 (three) times daily between meals.  90  Container  0  . Fluticasone-Salmeterol (ADVAIR DISKUS) 500-50 MCG/DOSE AEPB Inhale 1 puff into the lungs 2 (two) times daily.  60 each  11  . tiotropium (SPIRIVA HANDIHALER) 18 MCG inhalation capsule Resume  30 capsule  5  . [DISCONTINUED] Fluticasone-Salmeterol (ADVAIR) 250-50 MCG/DOSE AEPB Resume when brovana and budesonide run out  60 each    . [DISCONTINUED] tiotropium (SPIRIVA HANDIHALER) 18 MCG inhalation capsule Resume  30 capsule  5   No facility-administered encounter medications on file as of 12/20/2012.

## 2012-12-20 NOTE — Patient Instructions (Addendum)
When current supply of brovana and budesonide run out start Advair 500 one puff twice daily and Spiriva daily Stay on oxygen Use albuterol as needed Return 3 months

## 2012-12-21 LAB — FUNGUS CULTURE W SMEAR: Special Requests: NORMAL

## 2012-12-21 NOTE — Assessment & Plan Note (Addendum)
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   Chronic Atelectasis RUL s/p HCAP Plan When current supply of brovana and budesonide run out start Advair 500 one puff twice daily and Spiriva daily Stay on oxygen Use albuterol as needed No further ABX Return 3 months

## 2013-01-02 LAB — AFB CULTURE WITH SMEAR (NOT AT ARMC)
Acid Fast Smear: NONE SEEN
Special Requests: NORMAL

## 2013-03-01 ENCOUNTER — Ambulatory Visit (HOSPITAL_COMMUNITY)
Admission: RE | Admit: 2013-03-01 | Discharge: 2013-03-01 | Disposition: A | Discharge status: Home / Self Care | Payer: Medicare Other | Source: Ambulatory Visit | Attending: Internal Medicine | Admitting: Internal Medicine

## 2013-03-01 ENCOUNTER — Other Ambulatory Visit (HOSPITAL_COMMUNITY): Payer: Self-pay | Admitting: Internal Medicine

## 2013-03-01 DIAGNOSIS — R059 Cough, unspecified: Secondary | ICD-10-CM | POA: Insufficient documentation

## 2013-03-01 DIAGNOSIS — Z8701 Personal history of pneumonia (recurrent): Secondary | ICD-10-CM | POA: Insufficient documentation

## 2013-03-01 DIAGNOSIS — R05 Cough: Secondary | ICD-10-CM

## 2013-03-01 DIAGNOSIS — J984 Other disorders of lung: Secondary | ICD-10-CM | POA: Insufficient documentation

## 2013-03-04 ENCOUNTER — Other Ambulatory Visit: Payer: Self-pay | Admitting: Internal Medicine

## 2013-03-04 DIAGNOSIS — R9389 Abnormal findings on diagnostic imaging of other specified body structures: Secondary | ICD-10-CM

## 2013-03-04 DIAGNOSIS — R05 Cough: Secondary | ICD-10-CM

## 2013-03-09 ENCOUNTER — Ambulatory Visit
Admission: RE | Admit: 2013-03-09 | Discharge: 2013-03-09 | Disposition: A | Discharge status: Home / Self Care | Payer: Medicare Other | Source: Ambulatory Visit | Attending: Internal Medicine | Admitting: Internal Medicine

## 2013-03-09 DIAGNOSIS — R9389 Abnormal findings on diagnostic imaging of other specified body structures: Secondary | ICD-10-CM

## 2013-03-09 DIAGNOSIS — R05 Cough: Secondary | ICD-10-CM

## 2013-03-21 ENCOUNTER — Telehealth: Payer: Self-pay | Admitting: Critical Care Medicine

## 2013-03-21 ENCOUNTER — Encounter: Payer: Self-pay | Admitting: Critical Care Medicine

## 2013-03-21 NOTE — Telephone Encounter (Signed)
Pt needs appt to review CT Chest results Next avail ok

## 2013-03-21 NOTE — Telephone Encounter (Signed)
lmomtcb for pt to schedule f/u with PW to discuss CT Chest results.

## 2013-03-22 ENCOUNTER — Encounter (HOSPITAL_COMMUNITY): Payer: Self-pay | Admitting: Nurse Practitioner

## 2013-03-22 ENCOUNTER — Emergency Department (HOSPITAL_COMMUNITY): Payer: Medicare Other

## 2013-03-22 ENCOUNTER — Inpatient Hospital Stay (HOSPITAL_COMMUNITY)
Admission: EM | Admit: 2013-03-22 | Discharge: 2013-03-25 | DRG: 193 | Disposition: A | Discharge status: Home / Self Care | Payer: Medicare Other | Attending: Pulmonary Disease | Admitting: Pulmonary Disease

## 2013-03-22 DIAGNOSIS — E039 Hypothyroidism, unspecified: Secondary | ICD-10-CM | POA: Diagnosis present

## 2013-03-22 DIAGNOSIS — D899 Disorder involving the immune mechanism, unspecified: Secondary | ICD-10-CM | POA: Diagnosis present

## 2013-03-22 DIAGNOSIS — I509 Heart failure, unspecified: Secondary | ICD-10-CM | POA: Diagnosis present

## 2013-03-22 DIAGNOSIS — J9620 Acute and chronic respiratory failure, unspecified whether with hypoxia or hypercapnia: Secondary | ICD-10-CM

## 2013-03-22 DIAGNOSIS — M069 Rheumatoid arthritis, unspecified: Secondary | ICD-10-CM

## 2013-03-22 DIAGNOSIS — J189 Pneumonia, unspecified organism: Principal | ICD-10-CM

## 2013-03-22 DIAGNOSIS — A419 Sepsis, unspecified organism: Secondary | ICD-10-CM

## 2013-03-22 DIAGNOSIS — J441 Chronic obstructive pulmonary disease with (acute) exacerbation: Secondary | ICD-10-CM

## 2013-03-22 DIAGNOSIS — E119 Type 2 diabetes mellitus without complications: Secondary | ICD-10-CM | POA: Diagnosis present

## 2013-03-22 DIAGNOSIS — R739 Hyperglycemia, unspecified: Secondary | ICD-10-CM

## 2013-03-22 DIAGNOSIS — I5032 Chronic diastolic (congestive) heart failure: Secondary | ICD-10-CM

## 2013-03-22 DIAGNOSIS — E669 Obesity, unspecified: Secondary | ICD-10-CM | POA: Diagnosis present

## 2013-03-22 DIAGNOSIS — Z8701 Personal history of pneumonia (recurrent): Secondary | ICD-10-CM

## 2013-03-22 LAB — CBC
Platelets: 247 10*3/uL (ref 150–400)
RBC: 4.67 MIL/uL (ref 3.87–5.11)
RDW: 15.2 % (ref 11.5–15.5)
WBC: 31.2 10*3/uL — ABNORMAL HIGH (ref 4.0–10.5)

## 2013-03-22 LAB — PROCALCITONIN: Procalcitonin: 1.53 ng/mL

## 2013-03-22 LAB — PRO B NATRIURETIC PEPTIDE: Pro B Natriuretic peptide (BNP): 462.9 pg/mL — ABNORMAL HIGH (ref 0–125)

## 2013-03-22 LAB — POCT I-STAT 3, ART BLOOD GAS (G3+)
O2 Saturation: 99 %
Patient temperature: 98.6
TCO2: 29 mmol/L (ref 0–100)
pCO2 arterial: 38.2 mmHg (ref 35.0–45.0)
pH, Arterial: 7.464 — ABNORMAL HIGH (ref 7.350–7.450)

## 2013-03-22 LAB — BASIC METABOLIC PANEL
CO2: 27 mEq/L (ref 19–32)
Chloride: 101 mEq/L (ref 96–112)
GFR calc Af Amer: 79 mL/min — ABNORMAL LOW (ref >90)
Glucose, Bld: 119 mg/dL — ABNORMAL HIGH (ref 70–99)
Potassium: 4 mEq/L (ref 3.5–5.1)
Sodium: 138 mEq/L (ref 135–145)

## 2013-03-22 LAB — POCT I-STAT TROPONIN I: Troponin i, poc: 0 ng/mL (ref 0.00–0.08)

## 2013-03-22 MED ORDER — DEXTROSE 5 % IV SOLN
2.0000 g | Freq: Three times a day (TID) | INTRAVENOUS | Status: DC
  Administered 2013-03-23 – 2013-03-25 (×5): 2 g via INTRAVENOUS
  Filled 2013-03-22 (×9): qty 2

## 2013-03-22 MED ORDER — MONTELUKAST SODIUM 10 MG PO TABS
10.0000 mg | ORAL_TABLET | Freq: Every day | ORAL | Status: DC
  Administered 2013-03-23 – 2013-03-25 (×3): 10 mg via ORAL
  Filled 2013-03-22 (×3): qty 1

## 2013-03-22 MED ORDER — BOOST / RESOURCE BREEZE PO LIQD
1.0000 | Freq: Three times a day (TID) | ORAL | Status: DC
  Administered 2013-03-23 – 2013-03-25 (×6): 1 via ORAL

## 2013-03-22 MED ORDER — MAGNESIUM OXIDE 400 (241.3 MG) MG PO TABS
200.0000 mg | ORAL_TABLET | Freq: Every day | ORAL | Status: DC
  Administered 2013-03-23 – 2013-03-25 (×3): 200 mg via ORAL
  Filled 2013-03-22 (×3): qty 0.5

## 2013-03-22 MED ORDER — SODIUM CHLORIDE 0.9 % IV BOLUS (SEPSIS)
1000.0000 mL | Freq: Once | INTRAVENOUS | Status: AC
  Administered 2013-03-22: 1000 mL via INTRAVENOUS

## 2013-03-22 MED ORDER — LEVOTHYROXINE SODIUM 50 MCG PO TABS
50.0000 ug | ORAL_TABLET | Freq: Every day | ORAL | Status: DC
  Administered 2013-03-23 – 2013-03-25 (×3): 50 ug via ORAL
  Filled 2013-03-22 (×6): qty 1

## 2013-03-22 MED ORDER — MULTIVITAMINS PO CAPS: 1.0000 | ORAL_CAPSULE | Freq: Every day | ORAL | Status: DC

## 2013-03-22 MED ORDER — DOCUSATE SODIUM 100 MG PO CAPS
100.0000 mg | ORAL_CAPSULE | Freq: Two times a day (BID) | ORAL | Status: DC
Start: 2013-03-22 — End: 2013-03-25
  Administered 2013-03-22 – 2013-03-25 (×6): 100 mg via ORAL
  Filled 2013-03-22 (×7): qty 1

## 2013-03-22 MED ORDER — SODIUM CHLORIDE 0.9 % IV BOLUS (SEPSIS): 1000.0000 mL | Freq: Once | INTRAVENOUS | Status: DC

## 2013-03-22 MED ORDER — ALBUTEROL SULFATE (5 MG/ML) 0.5% IN NEBU: 2.5000 mg | INHALATION_SOLUTION | RESPIRATORY_TRACT | Status: DC | PRN

## 2013-03-22 MED ORDER — LORATADINE 10 MG PO TABS
10.0000 mg | ORAL_TABLET | Freq: Every day | ORAL | Status: DC
  Administered 2013-03-23 – 2013-03-25 (×3): 10 mg via ORAL
  Filled 2013-03-22 (×3): qty 1

## 2013-03-22 MED ORDER — ALBUTEROL (5 MG/ML) CONTINUOUS INHALATION SOLN
10.0000 mg/h | INHALATION_SOLUTION | Freq: Once | RESPIRATORY_TRACT | Status: AC
  Administered 2013-03-22: 10 mg/h via RESPIRATORY_TRACT
  Filled 2013-03-22: qty 20

## 2013-03-22 MED ORDER — ALBUTEROL SULFATE (5 MG/ML) 0.5% IN NEBU
2.5000 mg | INHALATION_SOLUTION | Freq: Once | RESPIRATORY_TRACT | Status: DC
  Filled 2013-03-22: qty 0.5

## 2013-03-22 MED ORDER — LEFLUNOMIDE 10 MG PO TABS: 10.0000 mg | ORAL_TABLET | Freq: Every day | ORAL | Status: DC

## 2013-03-22 MED ORDER — PREDNISONE 20 MG PO TABS
60.0000 mg | ORAL_TABLET | Freq: Once | ORAL | Status: AC
  Administered 2013-03-22: 60 mg via ORAL
  Filled 2013-03-22: qty 3

## 2013-03-22 MED ORDER — POTASSIUM CHLORIDE CRYS ER 20 MEQ PO TBCR
60.0000 meq | EXTENDED_RELEASE_TABLET | Freq: Every day | ORAL | Status: DC
  Administered 2013-03-23 – 2013-03-25 (×3): 60 meq via ORAL
  Filled 2013-03-22 (×4): qty 3

## 2013-03-22 MED ORDER — ALBUTEROL SULFATE (5 MG/ML) 0.5% IN NEBU
2.5000 mg | INHALATION_SOLUTION | RESPIRATORY_TRACT | Status: DC
  Administered 2013-03-22 – 2013-03-23 (×3): 2.5 mg via RESPIRATORY_TRACT
  Filled 2013-03-22 (×3): qty 0.5

## 2013-03-22 MED ORDER — GUAIFENESIN ER 600 MG PO TB12
600.0000 mg | ORAL_TABLET | Freq: Two times a day (BID) | ORAL | Status: DC
Start: 2013-03-22 — End: 2013-03-25
  Administered 2013-03-22 – 2013-03-25 (×6): 600 mg via ORAL
  Filled 2013-03-22 (×8): qty 1

## 2013-03-22 MED ORDER — PANTOPRAZOLE SODIUM 40 MG PO TBEC
40.0000 mg | DELAYED_RELEASE_TABLET | Freq: Every day | ORAL | Status: DC
  Administered 2013-03-23 – 2013-03-24 (×2): 40 mg via ORAL
  Filled 2013-03-22 (×3): qty 1

## 2013-03-22 MED ORDER — HEPARIN SODIUM (PORCINE) 5000 UNIT/ML IJ SOLN
5000.0000 [IU] | Freq: Three times a day (TID) | INTRAMUSCULAR | Status: DC
  Administered 2013-03-22 – 2013-03-25 (×8): 5000 [IU] via SUBCUTANEOUS
  Filled 2013-03-22 (×12): qty 1

## 2013-03-22 MED ORDER — DEXTROSE 5 % IV SOLN
500.0000 mg | INTRAVENOUS | Status: DC
  Administered 2013-03-23 (×2): 500 mg via INTRAVENOUS
  Filled 2013-03-22 (×3): qty 500

## 2013-03-22 MED ORDER — FOLIC ACID 1 MG PO TABS
2.0000 mg | ORAL_TABLET | Freq: Every day | ORAL | Status: DC
  Filled 2013-03-22 (×3): qty 2

## 2013-03-22 MED ORDER — MAGNESIUM-ZINC 133.33-5 MG PO TABS: 2.0000 | ORAL_TABLET | Freq: Every day | ORAL | Status: DC

## 2013-03-22 MED ORDER — VITAMIN D 1000 UNITS PO TABS
5000.0000 [IU] | ORAL_TABLET | Freq: Every day | ORAL | Status: DC
  Administered 2013-03-23 – 2013-03-25 (×3): 5000 [IU] via ORAL
  Filled 2013-03-22 (×3): qty 5

## 2013-03-22 MED ORDER — NYSTATIN 100000 UNIT/GM EX CREA
1.0000 "application " | TOPICAL_CREAM | Freq: Every day | CUTANEOUS | Status: DC | PRN
  Filled 2013-03-22: qty 15

## 2013-03-22 MED ORDER — ALLOPURINOL 100 MG PO TABS
100.0000 mg | ORAL_TABLET | Freq: Every day | ORAL | Status: DC
  Administered 2013-03-23 – 2013-03-25 (×3): 100 mg via ORAL
  Filled 2013-03-22 (×3): qty 1

## 2013-03-22 MED ORDER — BUDESONIDE 0.25 MG/2ML IN SUSP: 0.2500 mg | Freq: Every day | RESPIRATORY_TRACT | Status: DC

## 2013-03-22 MED ORDER — ONDANSETRON HCL 4 MG PO TABS: 4.0000 mg | ORAL_TABLET | Freq: Three times a day (TID) | ORAL | Status: DC | PRN

## 2013-03-22 MED ORDER — DEXTROSE 5 % IV SOLN
1.0000 g | Freq: Once | INTRAVENOUS | Status: AC
  Administered 2013-03-22: 1 g via INTRAVENOUS
  Filled 2013-03-22: qty 10

## 2013-03-22 MED ORDER — ADULT MULTIVITAMIN W/MINERALS CH
1.0000 | ORAL_TABLET | Freq: Every day | ORAL | Status: DC
  Administered 2013-03-23 – 2013-03-25 (×3): 1 via ORAL
  Filled 2013-03-22 (×3): qty 1

## 2013-03-22 MED ORDER — IPRATROPIUM BROMIDE 0.02 % IN SOLN
0.5000 mg | Freq: Once | RESPIRATORY_TRACT | Status: AC
  Administered 2013-03-22: 0.5 mg via RESPIRATORY_TRACT
  Filled 2013-03-22 (×2): qty 2.5

## 2013-03-22 MED ORDER — MOMETASONE FURO-FORMOTEROL FUM 200-5 MCG/ACT IN AERO
2.0000 | INHALATION_SPRAY | Freq: Two times a day (BID) | RESPIRATORY_TRACT | Status: DC
  Administered 2013-03-23 – 2013-03-24 (×2): 2 via RESPIRATORY_TRACT
  Filled 2013-03-22 (×2): qty 8.8

## 2013-03-22 MED ORDER — METHYLPREDNISOLONE SODIUM SUCC 125 MG IJ SOLR
80.0000 mg | Freq: Three times a day (TID) | INTRAMUSCULAR | Status: DC
  Administered 2013-03-22: via INTRAVENOUS
  Administered 2013-03-23: 80 mg via INTRAVENOUS
  Filled 2013-03-22 (×5): qty 1.28
  Filled 2013-03-22 (×2): qty 2

## 2013-03-22 MED ORDER — FUROSEMIDE 40 MG PO TABS
40.0000 mg | ORAL_TABLET | Freq: Every day | ORAL | Status: DC
  Administered 2013-03-23 – 2013-03-25 (×3): 40 mg via ORAL
  Filled 2013-03-22 (×3): qty 1

## 2013-03-22 MED ORDER — TIOTROPIUM BROMIDE MONOHYDRATE 18 MCG IN CAPS
18.0000 ug | ORAL_CAPSULE | Freq: Every day | RESPIRATORY_TRACT | Status: DC
  Administered 2013-03-23 – 2013-03-24 (×2): 18 ug via RESPIRATORY_TRACT
  Filled 2013-03-22: qty 5

## 2013-03-22 MED ORDER — VANCOMYCIN HCL IN DEXTROSE 1-5 GM/200ML-% IV SOLN
1000.0000 mg | Freq: Once | INTRAVENOUS | Status: DC
  Filled 2013-03-22: qty 200

## 2013-03-22 MED ORDER — VITAMIN C 500 MG PO TABS
1000.0000 mg | ORAL_TABLET | Freq: Every day | ORAL | Status: DC
  Administered 2013-03-23 – 2013-03-25 (×3): 1000 mg via ORAL
  Filled 2013-03-22 (×3): qty 2

## 2013-03-22 MED ORDER — ARFORMOTEROL TARTRATE 15 MCG/2ML IN NEBU: 15.0000 ug | INHALATION_SOLUTION | Freq: Two times a day (BID) | RESPIRATORY_TRACT | Status: DC

## 2013-03-22 MED ORDER — DEXTROSE 5 % IV SOLN
500.0000 mg | Freq: Once | INTRAVENOUS | Status: AC
  Administered 2013-03-22: 500 mg via INTRAVENOUS
  Filled 2013-03-22: qty 500

## 2013-03-22 MED ORDER — LORAZEPAM 2 MG/ML IJ SOLN
1.0000 mg | Freq: Once | INTRAMUSCULAR | Status: AC
  Administered 2013-03-22: 1 mg via INTRAVENOUS
  Filled 2013-03-22: qty 1

## 2013-03-22 MED ORDER — CYANOCOBALAMIN 250 MCG PO TABS
250.0000 ug | ORAL_TABLET | ORAL | Status: DC
  Administered 2013-03-25: 250 ug via ORAL
  Filled 2013-03-22 (×2): qty 1

## 2013-03-22 MED ORDER — FLUTICASONE PROPIONATE 50 MCG/ACT NA SUSP
1.0000 | Freq: Every day | NASAL | Status: DC | PRN
Start: 2013-03-22 — End: 2013-03-25
  Filled 2013-03-22: qty 16

## 2013-03-22 MED ORDER — VANCOMYCIN HCL IN DEXTROSE 1-5 GM/200ML-% IV SOLN
1000.0000 mg | Freq: Once | INTRAVENOUS | Status: AC
  Administered 2013-03-22: 1000 mg via INTRAVENOUS
  Filled 2013-03-22: qty 200

## 2013-03-22 NOTE — Care Management (Signed)
Pt taken off BiPap per Dr Herma Carson, Placed on 9 lt CAT.

## 2013-03-22 NOTE — Telephone Encounter (Signed)
Called, spoke with pt.  We have scheduled her to see PW on 03/31/13 to discuss CT Chest results at 9:15 am at [facility].  Pt aware.    I cancelled the 04/12/13 appt with PW per pt's request d/t coming in on 10/16.  Pt aware.

## 2013-03-22 NOTE — ED Notes (Signed)
Iv attempted X 2. IV team called.

## 2013-03-22 NOTE — ED Notes (Signed)
Pt sent from PCP for further eval of acute on chronic respiratory distress. Family reports her O2 sats have been low at home and shes been coughing yellow mucous. Pt is a&ox4, able to speak in short phrases. C/o L sided CP with cough

## 2013-03-22 NOTE — ED Notes (Signed)
MD at Bedside.

## 2013-03-22 NOTE — ED Provider Notes (Signed)
62 year old female, patient of Dr. Delford Field with pulmonary critical care, recently on steroids within the last 7 days because of ongoing cough and shortness of breath. She was admitted to the hospital in May of 2014 with significant pneumonia, suffered a spontaneous pneumothorax that according to the patient did not require thoracostomy. She states that over last several days she's had increased shortness of breath with cough productive of yellow sputum, she denies fevers, she does have left-sided chest pain associated with her shortness of breath and increased respiratory effort. She does have baseline nasal cannula 3 L which was increased from 2 L in the last couple of weeks, she is requiring more than that today.  On exam the patient is dyspneic, she has increased respiratory effort, speaks in short sentences, has diffuse expiratory wheezing but decreased lung sounds on the left. There is no peripheral edema or asymmetry of the legs, oxygen levels are approximately 91-92% on increased nasal cannula supportive oxygen. She is tachycardic to 120 and appears critically ill.  Shown a chest x-ray, labs, continuous nebulizer therapy and I anticipate admission to the hospital to a high level of care including step down or intensive care unit depending on the findings and her clinical course. At this time she does not need to be admitted.  Phlebotomy with difficulty finding IV access for blood draw, I personally placed the peripheral IV, 20-gauge in the left decubital fossa under ultrasound guidance. Images are not archived. Blood draw with 20 cc of dark red blood, flushes without difficulty, patient's respiratory status is worsening and may require bipap. She is still able to speak and mentating appropriately, blood pressure is remaining in a normal range.  I have personally seen and interpreted the anterior is to report will upright chest x-ray which shows that the patient has any increased opacification of the  left upper lobe of the lung consistent with a recurrent pneumonia. The patient has recently been admitted to the hospital back in June but given her underlying lung disease we'll cover her with broad-spectrum antibiotics including Rocephin, Zithromax, vancomycin. We'll discuss with critical care  WBC severely elevated, c/w a sepsis syndrome.  Results for orders placed during the hospital encounter of 03/22/13  CBC      Result Value Range   WBC 31.2 (*) 4.0 - 10.5 K/uL   RBC 4.67  3.87 - 5.11 MIL/uL   Hemoglobin 13.0  12.0 - 15.0 g/dL   HCT 16.1  09.6 - 04.5 %   MCV 82.7  78.0 - 100.0 fL   MCH 27.8  26.0 - 34.0 pg   MCHC 33.7  30.0 - 36.0 g/dL   RDW 40.9  81.1 - 91.4 %   Platelets 247  150 - 400 K/uL  BASIC METABOLIC PANEL      Result Value Range   Sodium 138  135 - 145 mEq/L   Potassium 4.0  3.5 - 5.1 mEq/L   Chloride 101  96 - 112 mEq/L   CO2 27  19 - 32 mEq/L   Glucose, Bld 119 (*) 70 - 99 mg/dL   BUN 15  6 - 23 mg/dL   Creatinine, Ser 7.82  0.50 - 1.10 mg/dL   Calcium 9.0  8.4 - 95.6 mg/dL   GFR calc non Af Amer 68 (*) >90 mL/min   GFR calc Af Amer 79 (*) >90 mL/min  PRO B NATRIURETIC PEPTIDE      Result Value Range   Pro B Natriuretic peptide (BNP) 462.9 (*) 0 -  125 pg/mL  POCT I-STAT TROPONIN I      Result Value Range   Troponin i, poc 0.00  0.00 - 0.08 ng/mL   Comment 3           POCT I-STAT 3, BLOOD GAS (G3+)      Result Value Range   pH, Arterial 7.464 (*) 7.350 - 7.450   pCO2 arterial 38.2  35.0 - 45.0 mmHg   pO2, Arterial 125.0 (*) 80.0 - 100.0 mmHg   Bicarbonate 27.4 (*) 20.0 - 24.0 mEq/L   TCO2 29  0 - 100 mmol/L   O2 Saturation 99.0     Acid-Base Excess 4.0 (*) 0.0 - 2.0 mmol/L   Patient temperature 98.6 F     Collection site RADIAL, ALLEN'S TEST ACCEPTABLE     Drawn by Operator     Sample type ARTERIAL      Dg Chest Portable 1 View  03/22/2013   CLINICAL DATA:  Initial encounter for current episode of shortness of breath.  EXAM: PORTABLE CHEST - 1 VIEW   COMPARISON:  CT chest 03/09/2013. Two-view chest x-ray 03/01/2013, 11/30/2012, 11/02/2012.  FINDINGS: Cardiac silhouette upper normal in size, unchanged. Pleuroparenchymal scarring in the right apex with retraction of the hilum superiorly and deviation of the trachea to the right, unchanged. New airspace opacities in the left upper lobe, superimposed upon severe emphysematous changes and pleuroparenchymal scarring at the left apex. No new pulmonary parenchymal abnormalities elsewhere.  IMPRESSION: Pneumonia involving the left upper lobe, superimposed upon pleuroparenchymal scarring and emphysema. Stable dense pleuroparenchymal scarring in the right upper lobe with retraction of the right hilum superiorly and deviation of the trachea to the right.   Electronically Signed   By: Hulan Saas M.D.   On: 03/22/2013 18:36       I saw and evaluated the patient, reviewed the resident's note and I agree with the findings and plan.  CRITICAL CARE Performed by: Vida Roller Total critical care time: 45 Critical care time was exclusive of separately billable procedures and treating other patients. Critical care was necessary to treat or prevent imminent or life-threatening deterioration. Critical care was time spent personally by me on the following activities: development of treatment plan with patient and/or surrogate as well as nursing, discussions with consultants, evaluation of patient's response to treatment, examination of patient, obtaining history from patient or surrogate, ordering and performing treatments and interventions, ordering and review of laboratory studies, ordering and review of radiographic studies, pulse oximetry and re-evaluation of patient's condition.  Diagnosis   #1  acute respiratory failure #2 Sepsis #3  left upper lobe pneumonia, community acquired #4 severe COPD   I personally interpreted the EKG as well as the resident and agree with the interpretation on the  resident's chart.    Vida Roller, MD 03/22/13 [phone]

## 2013-03-22 NOTE — Progress Notes (Signed)
ANTIBIOTIC CONSULT NOTE - INITIAL  Pharmacy Consult for vancomycin/aztreonam Indication: pneumonia  Allergies  Allergen Reactions  . Penicillins Rash  . Sulfonamide Derivatives Rash  . Tetanus Toxoid Other (See Comments)    Knot on arm    Patient Measurements: Height: 5\' 4"  (162.6 cm) Weight: 260 lb 6.4 oz (118.117 kg) IBW/kg (Calculated) : 54.7   Vital Signs: Temp: 98.4 F (36.9 C) (10/07 1808) Temp src: Oral (10/07 1808) BP: 114/71 mmHg (10/07 2102) Pulse Rate: 127 (10/07 2001) Intake/Output from previous day:   Intake/Output from this shift: Total I/O In: 1250 [I.V.:1250] Out: -   Labs:  Recent Labs  03/22/13 1903  WBC 31.2*  HGB 13.0  PLT 247  CREATININE 0.89   Estimated Creatinine Clearance: 82.9 ml/min (by C-G formula based on Cr of 0.89). No results found for this basename: VANCOTROUGH, VANCOPEAK, VANCORANDOM, GENTTROUGH, GENTPEAK, GENTRANDOM, TOBRATROUGH, TOBRAPEAK, TOBRARND, AMIKACINPEAK, AMIKACINTROU, AMIKACIN,  in the last 72 hours   Microbiology: No results found for this or any previous visit (from the past 720 hour(s)).  Medical History: Past Medical History  Diagnosis Date  . Asthma   . Rheumatoid arthritis(714.0)   . Emphysema     02 dependent 3L/min  . Emphysema   . Pneumonia   . Hypothyroidism   . Diabetes mellitus, type II   . Obesity   . Diastolic heart failure 09/22/2012    Grade 1  . Emphysema    Assessment: 62 year old female with progressive shortness of breath, cxr shows pneumonia, community acquired. Multiple abx allergies noted, ceftriaxone/vanc given in ED, orders to continue vancomycin and aztreonam. No fevers noted however she does have significant leukocytosis with wbc at 31.2. Renal function appears normal.  Goal of Therapy:  Vancomycin trough level 15-20 mcg/ml  Plan:  Measure antibiotic drug levels at steady state Follow up culture results Vancomycin 2g load total then 1g IV q12 Aztreonam 2g IV q8 hours  Sheppard Coil PharmD., BCPS Clinical Pharmacist Pager [phone] 03/22/2013 9:37 PM

## 2013-03-22 NOTE — ED Provider Notes (Signed)
CSN: [id number]     Arrival date & time 03/22/13  1749 History   First MD Initiated Contact with Patient 03/22/13 1801     Chief Complaint  Patient presents with  . Shortness of Breath   (Consider location/radiation/quality/duration/timing/severity/associated sxs/prior Treatment) Patient is a 62 y.o. female presenting with shortness of breath. The history is provided by the patient. No language interpreter was used.  Shortness of Breath Severity:  Severe Onset quality:  Gradual Duration:  2 days Timing:  Constant Progression:  Worsening Chronicity:  Recurrent Context comment:  Cough productive of yellow sputum Relieved by:  Oxygen Worsened by:  Exertion and movement Ineffective treatments:  Inhaler Associated symptoms: chest pain (left lateral pain with cough or deep inspiration), cough and sputum production   Associated symptoms: no abdominal pain, no fever, no hemoptysis, no sore throat and no vomiting   Risk factors: tobacco use (former, quit in 2008)   Risk factors: no hx of cancer, no hx of PE/DVT, no oral contraceptive use, no prolonged immobilization and no recent surgery     Past Medical History  Diagnosis Date  . Asthma   . Rheumatoid arthritis(714.0)   . Emphysema     02 dependent 3L/min  . Emphysema   . Pneumonia   . Hypothyroidism   . Diabetes mellitus, type II   . Obesity   . Diastolic heart failure 09/22/2012    Grade 1  . Emphysema    Past Surgical History  Procedure Laterality Date  . Eye surgery  at age 10  . Tubal ligation    . Colonoscopy    . Video bronchoscopy N/A 11/18/2012    Procedure: VIDEO BRONCHOSCOPY WITH FLUORO;  Surgeon: Storm Frisk, MD;  Location: [facility];  Service: Cardiopulmonary;  Laterality: N/A;   Family History  Problem Relation Age of Onset  . Asthma Mother   . Heart disease Mother   . Clotting disorder Mother   . Colon cancer Neg Hx   . Heart attack Father    History  Substance Use Topics  . Smoking status: Former  Smoker -- 3.00 packs/day for 43 years    Types: Cigarettes    Quit date: 04/24/2007  . Smokeless tobacco: Never Used     Comment: electronic cigarette daily  . Alcohol Use: Yes     Comment: occasional   OB History   Grav Para Term Preterm Abortions TAB SAB Ect Mult Living                 Review of Systems  Constitutional: Negative for fever.  HENT: Negative for sore throat and trouble swallowing.   Respiratory: Positive for cough, sputum production, chest tightness and shortness of breath. Negative for hemoptysis.   Cardiovascular: Positive for chest pain (left lateral pain with cough or deep inspiration). Negative for palpitations and leg swelling.  Gastrointestinal: Negative for nausea, vomiting, abdominal pain and diarrhea.  Genitourinary: Negative for dysuria.  All other systems reviewed and are negative.    Allergies  Penicillins; Sulfonamide derivatives; and Tetanus toxoid  Home Medications   Current Outpatient Rx  Name  Route  Sig  Dispense  Refill  . albuterol (PROVENTIL) (2.5 MG/3ML) 0.083% nebulizer solution   Nebulization   Take 3 mLs (2.5 mg total) by nebulization every 4 (four) hours as needed for wheezing or shortness of breath.   75 mL      . allopurinol (ZYLOPRIM) 100 MG tablet   Oral   Take 100 mg by mouth daily.         Marland Kitchen  arformoterol (BROVANA) 15 MCG/2ML NEBU      Stop when current supply runs out   120 mL      . Ascorbic Acid (VITAMIN C) 1000 MG tablet   Oral   Take 1,000 mg by mouth daily.         . benzonatate (TESSALON) 200 MG capsule   Oral   Take 1 capsule (200 mg total) by mouth 3 (three) times daily.   21 capsule   0   . budesonide (PULMICORT) 0.25 MG/2ML nebulizer solution      Stop when current supply runs out   60 mL      . cetirizine (ZYRTEC) 10 MG tablet      TAKE 1 TABLET EVERY DAY   30 tablet   0   . Cholecalciferol (VITAMIN D-3) 5000 UNITS TABS   Oral   Take 5,000 Units by mouth daily.         .  CYANOCOBALAMIN PO   Oral   Take 1 tablet by mouth every Monday, Wednesday, and Friday.         . docusate sodium 100 MG CAPS   Oral   Take 100 mg by mouth 2 (two) times daily.   10 capsule   0   . feeding supplement (RESOURCE BREEZE) LIQD   Oral   Take 1 Container by mouth 3 (three) times daily between meals.   90 Container   0   . fluticasone (FLONASE) 50 MCG/ACT nasal spray   Each Nare   Place 1 spray into both nostrils daily as needed for allergies.         . Fluticasone-Salmeterol (ADVAIR DISKUS) 500-50 MCG/DOSE AEPB   Inhalation   Inhale 1 puff into the lungs 2 (two) times daily.   60 each   11   . folic acid (FOLVITE) 1 MG tablet   Oral   Take 2 mg by mouth daily.          . furosemide (LASIX) 40 MG tablet   Oral   Take 40 mg by mouth daily.          Marland Kitchen guaiFENesin (MUCINEX) 600 MG 12 hr tablet   Oral   Take 1 tablet (600 mg total) by mouth 2 (two) times daily.   14 tablet   0   . levothyroxine (SYNTHROID, LEVOTHROID) 50 MCG tablet   Oral   Take 50 mcg by mouth daily before breakfast.         . MAGNESIUM-ZINC PO   Oral   Take 2 tablets by mouth daily.          . montelukast (SINGULAIR) 10 MG tablet   Oral   Take 1 tablet by mouth daily.         . Multiple Vitamin (MULTIVITAMIN) capsule   Oral   Take 1 capsule by mouth daily.         Marland Kitchen nystatin cream (MYCOSTATIN)   Topical   Apply 1 application topically daily as needed (under breasts).         Marland Kitchen omeprazole (PRILOSEC) 20 MG capsule   Oral   Take 1 capsule (20 mg total) by mouth daily.   30 capsule   4   . ondansetron (ZOFRAN) 4 MG tablet   Oral   Take 1 tablet (4 mg total) by mouth every 8 (eight) hours as needed for nausea.   20 tablet   0   . potassium chloride SA (K-DUR,KLOR-CON) 20 MEQ tablet  Oral   Take 60 mEq by mouth daily.         Marland Kitchen tiotropium (SPIRIVA HANDIHALER) 18 MCG inhalation capsule      Resume   30 capsule   5    BP 129/66  Pulse 127   Temp(Src) 98.4 F (36.9 C) (Oral)  Resp 22  Ht 5\' 4"  (1.626 m)  Wt 260 lb 6.4 oz (118.117 kg)  BMI 44.68 kg/m2  SpO2 93% Physical Exam  Vitals reviewed. Constitutional: She is oriented to person, place, and time. She appears well-developed and well-nourished.  HENT:  Head: Atraumatic.  Neck: No tracheal deviation present.  Cardiovascular: Regular rhythm, normal heart sounds and intact distal pulses.  Tachycardia present.   Pulmonary/Chest: No stridor. Tachypnea noted. She has decreased breath sounds (left lung fields). She has wheezes. She has rhonchi in the right middle field and the right lower field. She exhibits tenderness (left lateral ).  Able to speak short sentences. On 4 L [place], sats 91%  Abdominal: Soft. Bowel sounds are normal. She exhibits no distension. There is no tenderness.  Musculoskeletal: She exhibits no tenderness.  Neurological: She is alert and oriented to person, place, and time.  Skin: Skin is warm and dry.    ED Course  Procedures (including critical care time) Labs Review Labs Reviewed  CBC - Abnormal; Notable for the following:    WBC 31.2 (*)    All other components within normal limits  BASIC METABOLIC PANEL - Abnormal; Notable for the following:    Glucose, Bld 119 (*)    GFR calc non Af Amer 68 (*)    GFR calc Af Amer 79 (*)    All other components within normal limits  PRO B NATRIURETIC PEPTIDE - Abnormal; Notable for the following:    Pro B Natriuretic peptide (BNP) 462.9 (*)    All other components within normal limits  POCT I-STAT 3, BLOOD GAS (G3+) - Abnormal; Notable for the following:    pH, Arterial 7.464 (*)    pO2, Arterial 125.0 (*)    Bicarbonate 27.4 (*)    Acid-Base Excess 4.0 (*)    All other components within normal limits  PROCALCITONIN  CBC  BASIC METABOLIC PANEL  IGG, IGA, IGM  STREP PNEUMONIAE URINARY ANTIGEN  LEGIONELLA ANTIGEN, URINE  POCT I-STAT TROPONIN I   Imaging Review Dg Chest Portable 1 View  03/22/2013    CLINICAL DATA:  Initial encounter for current episode of shortness of breath.  EXAM: PORTABLE CHEST - 1 VIEW  COMPARISON:  CT chest 03/09/2013. Two-view chest x-ray 03/01/2013, 11/30/2012, 11/02/2012.  FINDINGS: Cardiac silhouette upper normal in size, unchanged. Pleuroparenchymal scarring in the right apex with retraction of the hilum superiorly and deviation of the trachea to the right, unchanged. New airspace opacities in the left upper lobe, superimposed upon severe emphysematous changes and pleuroparenchymal scarring at the left apex. No new pulmonary parenchymal abnormalities elsewhere.  IMPRESSION: Pneumonia involving the left upper lobe, superimposed upon pleuroparenchymal scarring and emphysema. Stable dense pleuroparenchymal scarring in the right upper lobe with retraction of the right hilum superiorly and deviation of the trachea to the right.   Electronically Signed   By: Hulan Saas M.D.   On: 03/22/2013 18:36    Date: 03/22/2013  Rate: 126  Rhythm: sinus tachycardia  QRS Axis: normal  Intervals: normal  ST/T Wave abnormalities: normal  Conduction Disutrbances:none  Narrative Interpretation: sinus tachycardia, no ischemic changes  Old EKG Reviewed: unchanged   MDM  No diagnosis  found.  62 y/o female with history of chronic respiratory failure 2/2 emphysema, DM, CHF presenting with SOB and productive cough x 2 days. Associated with left lateral chest wall pain made worse with cough and deep inspiration. No fevers. On 3 L [place] at home with increase requirement during illness. History of admission for pneumonia in May with spontaneous pneumothorax.  Tachycardic 120, hypoxic, and tachypneic. Decrease left breath sounds with wheezing and rhonchi on right. Sats 92% on 4 L [place]. No evidence of DVT on exam and PE less likely given patients presentation and history. Likely PNA with acute on chronic respiratory failure. Will get labs, CXR, EKG. Albuterol continuous nebs, atrovent, and prednisone.  Anticipate admission.   CXR with new left upper lobe opacity. Leukocytosis 31, BNP 462. Blood gas without hypercarbia. No recent hospitalization. Rocephin, Azithromycin, Vancomycin given. Patient transitioned to BiPAP due to work of breathing. Blood gas showed no hypercarbia. Critical care to admit.   Labs and imaging reviewed in my medical decision making if ordered. Patient discussed with my attending, Dr. Hyacinth Meeker.    Abagail Kitchens, MD 03/23/13 [phone]

## 2013-03-22 NOTE — H&P (Signed)
PULMONARY  / CRITICAL CARE MEDICINE  Name: Jacqueline Washington MRN: [id number] DOB: Feb 04, 1951    ADMISSION DATE:  03/22/2013 CONSULTATION DATE:  03/22/2013  REFERRING MD :  EDP PRIMARY SERVICE:  PCCM  CHIEF COMPLAINT:  Dyspnea  BRIEF PATIENT DESCRIPTION: 62 y/o immunocompromised (Leflunomide for RA) with emphysema (Dr. Lynelle Doctor office patient) and recurrent pneumonia admitted with increased dyspnea, subjective fevers and new pulmonary infiltrates.  Patient reports having systems for one week. She developed progressive worsening of dyspnea as well as her cough became productive for euro sputum. She has subjective fever but she did not check her temperature. She had increased oxygen requirements since several weeks ago. She reports having acute admission to the hospital in May of 2014 for pneumonia and spontaneous pneumothorax. She also reports having pneumonia in January of 2014 for which she did not seek medical attention. Overall she describes deterioration of her respiratory status since about 2 years ago.  SIGNIFICANT EVENTS / STUDIES:   LINE / TUBES:  CULTURES:  ANTIBIOTICS: Ceftriaxone 10/7 x 1 Aztreonam 10/7 >>> Vancomycin 10/7 >>> Azithromycin 10/7 >>>  HISTORY OF PRESENT ILLNESS:  62 y/o immunocompromised (Leflunomide for RA) with emphysema (Dr. Lynelle Doctor office patient) and recurrent pneumonia admitted with increased dyspnea, subjective fevers and new pulmonary infiltrates. Patient reports having symptoms for one week. She developed progressive worsening of dyspnea as well as her cough became productive for yellow sputum. She had subjective fever but she did not check her temperature. She had increased oxygen requirements since several weeks ago. She was treated with antibiotics and systemic steroids without significant improvement. She reports having an acute admission to the hospital in May of 2014 for pneumonia and spontaneous pneumothorax. She also reports having pneumonia in January  of 2014 for which she did not seek medical attention. Overall she describes deterioration of her respiratory status since about 2 years ago. She denies any chest pain or palpitations.  PAST MEDICAL HISTORY :  Past Medical History  Diagnosis Date  . Asthma   . Rheumatoid arthritis(714.0)   . Emphysema     02 dependent 3L/min  . Emphysema   . Pneumonia   . Hypothyroidism   . Diabetes mellitus, type II   . Obesity   . Diastolic heart failure 09/22/2012    Grade 1  . Emphysema    Past Surgical History  Procedure Laterality Date  . Eye surgery  at age 7  . Tubal ligation    . Colonoscopy    . Video bronchoscopy N/A 11/18/2012    Procedure: VIDEO BRONCHOSCOPY WITH FLUORO;  Surgeon: Storm Frisk, MD;  Location: [facility];  Service: Cardiopulmonary;  Laterality: N/A;   Prior to Admission medications   Medication Sig Start Date End Date Taking? Authorizing Provider  albuterol (PROVENTIL) (2.5 MG/3ML) 0.083% nebulizer solution Take 3 mLs (2.5 mg total) by nebulization every 4 (four) hours as needed for wheezing or shortness of breath. 11/30/12  Yes Storm Frisk, MD  allopurinol (ZYLOPRIM) 100 MG tablet Take 100 mg by mouth daily.   Yes Historical Provider, MD  arformoterol (BROVANA) 15 MCG/2ML NEBU Take 15 mcg by nebulization 2 (two) times daily.   Yes Historical Provider, MD  Ascorbic Acid (VITAMIN C) 1000 MG tablet Take 1,000 mg by mouth daily.   Yes Historical Provider, MD  budesonide (PULMICORT) 0.25 MG/2ML nebulizer solution Take 0.25 mg by nebulization daily.   Yes Historical Provider, MD  cetirizine (ZYRTEC) 10 MG tablet Take 10 mg by mouth daily.  Yes Historical Provider, MD  Cholecalciferol (VITAMIN D-3) 5000 UNITS TABS Take 5,000 Units by mouth daily.   Yes Historical Provider, MD  CYANOCOBALAMIN PO Take 1 tablet by mouth every Monday, Wednesday, and Friday.   Yes Historical Provider, MD  docusate sodium 100 MG CAPS Take 100 mg by mouth 2 (two) times daily. 10/26/12  Yes  Laveda Norman, MD  feeding supplement (RESOURCE BREEZE) LIQD Take 1 Container by mouth 3 (three) times daily between meals. 10/26/12  Yes Laveda Norman, MD  fluticasone (FLONASE) 50 MCG/ACT nasal spray Place 1 spray into both nostrils daily as needed for allergies.   Yes Historical Provider, MD  Fluticasone-Salmeterol (ADVAIR DISKUS) 500-50 MCG/DOSE AEPB Inhale 1 puff into the lungs 2 (two) times daily. 12/20/12  Yes Storm Frisk, MD  folic acid (FOLVITE) 1 MG tablet Take 2 mg by mouth daily.    Yes Historical Provider, MD  furosemide (LASIX) 40 MG tablet Take 40 mg by mouth daily.    Yes Historical Provider, MD  guaiFENesin (MUCINEX) 600 MG 12 hr tablet Take 1 tablet (600 mg total) by mouth 2 (two) times daily. 10/26/12  Yes Laveda Norman, MD  leflunomide (ARAVA) 10 MG tablet Take 10 mg by mouth daily. 03/04/13  Yes Historical Provider, MD  levothyroxine (SYNTHROID, LEVOTHROID) 50 MCG tablet Take 50 mcg by mouth daily before breakfast.   Yes Historical Provider, MD  MAGNESIUM-ZINC PO Take 2 tablets by mouth daily.    Yes Historical Provider, MD  montelukast (SINGULAIR) 10 MG tablet Take 1 tablet by mouth daily.   Yes Historical Provider, MD  Multiple Vitamin (MULTIVITAMIN) capsule Take 1 capsule by mouth daily.   Yes Historical Provider, MD  nystatin cream (MYCOSTATIN) Apply 1 application topically daily as needed (under breasts).   Yes Historical Provider, MD  omeprazole (PRILOSEC) 20 MG capsule Take 1 capsule (20 mg total) by mouth daily. 11/30/12  Yes Storm Frisk, MD  ondansetron (ZOFRAN) 4 MG tablet Take 1 tablet (4 mg total) by mouth every 8 (eight) hours as needed for nausea. 11/30/12  Yes Storm Frisk, MD  potassium chloride SA (K-DUR,KLOR-CON) 20 MEQ tablet Take 60 mEq by mouth daily. 10/26/12  Yes Laveda Norman, MD  tiotropium (SPIRIVA) 18 MCG inhalation capsule Place 18 mcg into inhaler and inhale daily.   Yes Historical Provider, MD   Allergies  Allergen Reactions  . Penicillins Rash  .  Sulfonamide Derivatives Rash  . Tetanus Toxoid Other (See Comments)    Knot on arm   FAMILY HISTORY:  Family History  Problem Relation Age of Onset  . Asthma Mother   . Heart disease Mother   . Clotting disorder Mother   . Colon cancer Neg Hx   . Heart attack Father    SOCIAL HISTORY:  reports that she quit smoking about 5 years ago. Her smoking use included Cigarettes. She has a 129 pack-year smoking history. She has never used smokeless tobacco. She reports that  drinks alcohol. She reports that she does not use illicit drugs.  REVIEW OF SYSTEMS:   Constitutional: Negative for chills, weight loss, and diaphoresis. Positive for malaise, fatigue and subjective fever. HENT: Negative for hearing loss, ear pain, nosebleeds, congestion, sore throat, neck pain, tinnitus and ear discharge.   Eyes: Negative for blurred vision, double vision, photophobia, pain, discharge and redness.  Respiratory: Negative hemoptysis, and stridor.  Positive for shortness of breath and productive cough. Cardiovascular: Negative for chest pain, palpitations, orthopnea, claudication and  PND. Positive for intermittent leg swelling. Gastrointestinal: Negative for heartburn, nausea, vomiting, abdominal pain, diarrhea, constipation, blood in stool and melena.  Genitourinary: Negative for dysuria, urgency, frequency, hematuria and flank pain.  Musculoskeletal: Negative for myalgias, back pain, and falls. Positive for join pains. Skin: Negative for itching and rash.  Neurological: Negative for dizziness, tingling, tremors, sensory change, speech change, focal weakness, seizures, loss of consciousness, weakness and headaches.  Endo/Heme/Allergies: Negative for environmental allergies and polydipsia. Does not bruise/bleed easily.  SUBJECTIVE:   VITAL SIGNS: Temp:  [98.4 F (36.9 C)] 98.4 F (36.9 C) (10/07 1808) Pulse Rate:  [116-127] 127 (10/07 2001) Resp:  [22-34] 32 (10/07 2001) BP: (102-129)/(44-71) 117/69 mmHg  (10/07 1900) SpO2:  [85 %-98 %] 97 % (10/07 2001) Weight:  [118.117 kg (260 lb 6.4 oz)] 118.117 kg (260 lb 6.4 oz) (10/07 1756)  PHYSICAL EXAMINATION: General:  Obese, anxcious Neuro:  Awake, alert, cooperative with examination, non-focal HEENT:  NCAT, PERRL, moist membranes Neck:  Soft, no bruits, no lymphadenopathy, no carotid btuits Cardiovascular:  Somewhat tachycardic, regular Lungs:  Bilateral diminished air entry, expiratory wheezes Abdomen:  Obese, nontender, bowel sounds present, no organomegaly Musculoskeletal:  Moves all extremities, no edema, no digital clubbing Skin:  Intact, no rash  Recent Labs Lab 03/22/13 1903  NA 138  K 4.0  CL 101  CO2 27  BUN 15  CREATININE 0.89  GLUCOSE 119*    Recent Labs Lab 03/22/13 1903  HGB 13.0  HCT 38.6  WBC 31.2*  PLT 247   CXR:  10/7 >>> Emphysematous changes, old scarring, new left upper lung zone airspace disease  ASSESSMENT / PLAN:  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, acute exacerbation Acute on chronic respiratory failure Rheumatoid arthritis Hypothyroidism Chronic diastolic heart failure without exacerbation Morbid obesity  -->  Admit to SDU -->  Aztreonam / Vancomycin / Azithromycin -->  Dulera / Spiriva / Albuterol -->  Solu-Medrol -->  Supplemental oxygen -->  PCT, IgA, IgG, IgM, urine Strep Ag, urine Legionella Ag -->  Continue preadmission rx -->  Repeat labs / CXR in AM -->  Heparin for DVT Px  I have personally obtained history, examined patient, evaluated and interpreted laboratory and imaging results, reviewed medical records, formulated assessment / plan and placed orders.  Lonia Farber, MD Pulmonary and Critical Care Medicine [facility] Pager: [phone]  03/22/2013, 9:01 PM

## 2013-03-23 ENCOUNTER — Inpatient Hospital Stay (HOSPITAL_COMMUNITY): Payer: Medicare Other

## 2013-03-23 DIAGNOSIS — J9620 Acute and chronic respiratory failure, unspecified whether with hypoxia or hypercapnia: Secondary | ICD-10-CM

## 2013-03-23 DIAGNOSIS — J441 Chronic obstructive pulmonary disease with (acute) exacerbation: Secondary | ICD-10-CM

## 2013-03-23 DIAGNOSIS — A419 Sepsis, unspecified organism: Secondary | ICD-10-CM

## 2013-03-23 LAB — BASIC METABOLIC PANEL
BUN: 15 mg/dL (ref 6–23)
Calcium: 8.6 mg/dL (ref 8.4–10.5)
Chloride: 105 mEq/L (ref 96–112)
Creatinine, Ser: 0.92 mg/dL (ref 0.50–1.10)
GFR calc non Af Amer: 65 mL/min — ABNORMAL LOW (ref >90)
Glucose, Bld: 201 mg/dL — ABNORMAL HIGH (ref 70–99)
Sodium: 139 mEq/L (ref 135–145)

## 2013-03-23 LAB — CBC
Hemoglobin: 11.1 g/dL — ABNORMAL LOW (ref 12.0–15.0)
MCH: 26.6 pg (ref 26.0–34.0)
MCHC: 32.4 g/dL (ref 30.0–36.0)

## 2013-03-23 LAB — IGG, IGA, IGM: IgA: 107 mg/dL (ref 69–380)

## 2013-03-23 LAB — GLUCOSE, CAPILLARY

## 2013-03-23 LAB — MRSA PCR SCREENING: MRSA by PCR: NEGATIVE

## 2013-03-23 MED ORDER — INSULIN ASPART 100 UNIT/ML ~~LOC~~ SOLN: 0.0000 [IU] | Freq: Every day | SUBCUTANEOUS | Status: DC

## 2013-03-23 MED ORDER — METHYLPREDNISOLONE SODIUM SUCC 125 MG IJ SOLR
60.0000 mg | Freq: Three times a day (TID) | INTRAMUSCULAR | Status: DC
  Administered 2013-03-23 – 2013-03-25 (×6): 60 mg via INTRAVENOUS
  Filled 2013-03-23 (×10): qty 0.96

## 2013-03-23 MED ORDER — VANCOMYCIN HCL 10 G IV SOLR
2000.0000 mg | INTRAVENOUS | Status: DC
  Administered 2013-03-23: 2000 mg via INTRAVENOUS
  Filled 2013-03-23: qty 2000

## 2013-03-23 MED ORDER — VANCOMYCIN HCL IN DEXTROSE 1-5 GM/200ML-% IV SOLN
1000.0000 mg | Freq: Two times a day (BID) | INTRAVENOUS | Status: DC
  Filled 2013-03-23: qty 200

## 2013-03-23 MED ORDER — INSULIN ASPART 100 UNIT/ML ~~LOC~~ SOLN
0.0000 [IU] | Freq: Three times a day (TID) | SUBCUTANEOUS | Status: DC
  Administered 2013-03-24 (×2): 3 [IU] via SUBCUTANEOUS

## 2013-03-23 NOTE — Progress Notes (Signed)
ANTIBIOTIC CONSULT NOTE - Follow-up  Pharmacy Consult for vancomycin/aztreonam Indication: pneumonia  Allergies  Allergen Reactions  . Penicillins Rash  . Sulfonamide Derivatives Rash  . Tetanus Toxoid Other (See Comments)    Knot on arm    Patient Measurements: Height: 5\' 4"  (162.6 cm) Weight: 260 lb 6.4 oz (118.117 kg) IBW/kg (Calculated) : 54.7   Vital Signs: Temp: 98.1 F (36.7 C) (10/08 0700) Temp src: Oral (10/08 0700) BP: 103/61 mmHg (10/08 0811) Pulse Rate: 86 (10/08 0811) Intake/Output from previous day: 10/07 0701 - 10/08 0700 In: 1250 [I.V.:1250] Out: 250 [Urine:250] Intake/Output from this shift:    Labs:  Recent Labs  03/22/13 1903 03/23/13 0505  WBC 31.2* 17.5*  HGB 13.0 11.1*  PLT 247 210  CREATININE 0.89 0.92   Estimated Creatinine Clearance: 80.2 ml/min (by C-G formula based on Cr of 0.92). No results found for this basename: VANCOTROUGH, Leodis Binet, VANCORANDOM, GENTTROUGH, GENTPEAK, GENTRANDOM, TOBRATROUGH, TOBRAPEAK, TOBRARND, AMIKACINPEAK, AMIKACINTROU, AMIKACIN,  in the last 72 hours   Microbiology: Recent Results (from the past 720 hour(s))  MRSA PCR SCREENING     Status: None   Collection Time    03/23/13  6:30 AM      Result Value Range Status   MRSA by PCR NEGATIVE  NEGATIVE Final   Comment:            The GeneXpert MRSA Assay (FDA     approved for NASAL specimens     only), is one component of a     comprehensive MRSA colonization     surveillance program. It is not     intended to diagnose MRSA     infection nor to guide or     monitor treatment for     MRSA infections.    Assessment: 62 year old female on Vancomycin, Aztreonam, Azithromycin Day #2 for PNA in immunocompromised pt. H/o recurrent PNA - last May 2014. Pt with multiple antibiotic allergies. Afeb. Wbc down to 17.5. Renal function appears normal. Pt only received Vancomycin 1gm last night in ED - another 1 gm ordered to complete load but RN charted not given due  to duplicate. No cx done yet. PCT 1.53.  10/7 vanc>> 10/7 aztreonam>> 10/7 azith>>  Goal of Therapy:  Vancomycin trough level 15-20 mcg/ml  Plan:  Reload Vanc 2gm IV now then 1gm IV q12h. Aztreonam 2g IV q8 hr Will f/u micro data, pt's renal function Consider trough at Css in obese pt  Christoper Fabian, PharmD, BCPS Clinical pharmacist, pager [phone] 03/23/2013 10:02 AM

## 2013-03-23 NOTE — Progress Notes (Addendum)
PULMONARY  / CRITICAL CARE MEDICINE  Name: Jacqueline Washington MRN: [id number] DOB: August 06, 1950    ADMISSION DATE:  03/22/2013 CONSULTATION DATE:  03/22/2013  REFERRING MD :  EDP PRIMARY SERVICE:  PCCM  CHIEF COMPLAINT:  Dyspnea  BRIEF PATIENT DESCRIPTION: 62 y/o immunocompromised (Leflunomide for RA) with emphysema (Dr. Lynelle Washington office patient) and recurrent pneumonia admitted with increased dyspnea, subjective fevers and new pulmonary infiltrates.   SIGNIFICANT EVENTS / STUDIES:   LINE / TUBES:  CULTURES: U strep 10/7>>> U legionella 10/7>>> mrsa swab 10/7: neg   ANTIBIOTICS: Ceftriaxone 10/7 x 1 Aztreonam 10/7 >>> Vancomycin 10/7 >>>10/8 Azithromycin 10/7 >>>   SUBJECTIVE:  Feels close to baseline   VITAL SIGNS: Temp:  [98 F (36.7 C)-98.6 F (37 C)] 98.1 F (36.7 C) (10/08 0700) Pulse Rate:  [86-127] 86 (10/08 0811) Resp:  [22-34] 22 (10/08 0811) BP: (102-129)/(44-71) 103/61 mmHg (10/08 0811) SpO2:  [85 %-98 %] 96 % (10/08 1014) Weight:  [118.117 kg (260 lb 6.4 oz)] 118.117 kg (260 lb 6.4 oz) (10/07 1756)  PHYSICAL EXAMINATION: General:  Obese, anxious  Neuro:  Awake, alert, cooperative with examination, non-focal HEENT:  NCAT, PERRL, moist membranes Neck:  Soft, no bruits, no lymphadenopathy, no carotid btuits Cardiovascular:  Tachycardic, regular Lungs: Shallow, rapid (20's) breathing, diminsihed bilaterally, expiratory wheezes Abdomen:  Obese, nontender, bowel sounds present, no organomegaly Musculoskeletal:  Moves all extremities, no edema, no digital clubbing Skin:  Intact, no rash  Recent Labs Lab 03/22/13 1903 03/23/13 0505  NA 138 139  K 4.0 3.8  CL 101 105  CO2 27 22  BUN 15 15  CREATININE 0.89 0.92  GLUCOSE 119* 201*    Recent Labs Lab 03/22/13 1903 03/23/13 0505  HGB 13.0 11.1*  HCT 38.6 34.3*  WBC 31.2* 17.5*  PLT 247 210   CBG (last 3)  No results found for this basename: GLUCAP,  in the last 72 hours   CXR:  10/7 >>>  Emphysematous changes, old scarring, new left upper lung zone airspace disease  ASSESSMENT / PLAN:    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 This is likely due to pulse steroids.  P: Check a1c Cut pulse steroid does  Add ac/Hs ssi   ID PNA in immune compromissed host -continue aztreonam Consider change azithro to levofloxacin, follow clinical course May need repeat CT chst for new left infiltrates Assess quantof gold  Will mover her to Jacqueline Washington S,NP-S Pulmonary and Critical Care Medicine [facility] Pager: [phone]  03/23/2013, 11:02 AM  I have fully examined this patient and agree with above findings.    And edited in full  Jacqueline Washington. Jacqueline Alias, MD, FACP Pgr: [phone] [facility]

## 2013-03-23 NOTE — Progress Notes (Signed)
Utilization review completed.  

## 2013-03-23 NOTE — Progress Notes (Signed)
Patient refuses to wear BiPAP. She says she does not wear it at home. Patient says she tried it last night and couldn't breath while on BiPAP.

## 2013-03-23 NOTE — Progress Notes (Signed)
Pt still NPO and is in need for insulin coverage and a diet order. Anders Simmonds, NP on the unit and has been made aware. Will continue to monitor.

## 2013-03-23 NOTE — Progress Notes (Signed)
NURSING PROGRESS NOTE  ALEIRA DEITER [id number] Transfer Data: 03/23/2013 4:20 PM Attending Provider: Lonia Farber* WUJ:[id number],WGNFAOZ DAVID, MD Code Status: full  Jacqueline Washington is a 62 y.o. female patient transferred from 3S  -No acute distress noted.  -No complaints of shortness of breath.  -No complaints of chest pain.   Cardiac Monitoring: Box # 9 in place. Cardiac monitor yields:normal sinus rhythm.  Blood pressure 107/59, pulse 107, temperature 97.9 F (36.6 C), temperature source Oral, resp. rate 27, height 5\' 4"  (1.626 m), weight 118.117 kg (260 lb 6.4 oz), SpO2 92.00%.   IV Fluids:  IV in place, occlusive dsg intact without redness, IV cath antecubital left, condition patent and no redness normal saline.   Allergies:  Penicillins; Sulfonamide derivatives; and Tetanus toxoid  Past Medical History:   has a past medical history of Asthma; Rheumatoid arthritis(714.0); Emphysema; Emphysema; Pneumonia; Hypothyroidism; Diabetes mellitus, type II; Obesity; Diastolic heart failure (09/22/2012); and Emphysema.  Past Surgical History:   has past surgical history that includes Eye surgery (at age 62); Tubal ligation; Colonoscopy; and Video bronchoscopy (N/A, 11/18/2012).  Social History:   reports that she quit smoking about 5 years ago. Her smoking use included Cigarettes. She has a 129 pack-year smoking history. She has never used smokeless tobacco. She reports that she drinks alcohol. She reports that she does not use illicit drugs.  Skin: NSI  Patient/Family orientated to room. Information packet given to patient/family. Admission inpatient armband information verified with patient/family to include name and date of birth and placed on patient arm. Side rails up x 2, fall assessment and education completed with patient/family. Patient/family able to verbalize understanding of risk associated with falls and verbalized understanding to call for assistance before getting out  of bed. Call light within reach. Patient/family able to voice and demonstrate understanding of unit orientation instructions.    Will continue to evaluate and treat per MD orders.

## 2013-03-23 NOTE — ED Provider Notes (Signed)
I saw and evaluated the patient, reviewed the resident's note and I agree with the findings and plan.  Please see my separate note regarding my evaluation of the patient.     Jacqueline Roller, MD 03/23/13 [phone]

## 2013-03-24 ENCOUNTER — Inpatient Hospital Stay (HOSPITAL_COMMUNITY): Payer: Medicare Other

## 2013-03-24 LAB — CBC WITH DIFFERENTIAL/PLATELET
Basophils Absolute: 0 10*3/uL (ref 0.0–0.1)
Eosinophils Relative: 0 % (ref 0–5)
HCT: 34.8 % — ABNORMAL LOW (ref 36.0–46.0)
Hemoglobin: 11.4 g/dL — ABNORMAL LOW (ref 12.0–15.0)
Lymphocytes Relative: 2 % — ABNORMAL LOW (ref 12–46)
Lymphs Abs: 0.4 10*3/uL — ABNORMAL LOW (ref 0.7–4.0)
MCV: 82.3 fL (ref 78.0–100.0)
Monocytes Absolute: 1.5 10*3/uL — ABNORMAL HIGH (ref 0.1–1.0)
Monocytes Relative: 7 % (ref 3–12)
Neutro Abs: 20 10*3/uL — ABNORMAL HIGH (ref 1.7–7.7)
RBC: 4.23 MIL/uL (ref 3.87–5.11)
RDW: 15.5 % (ref 11.5–15.5)
WBC: 22 10*3/uL — ABNORMAL HIGH (ref 4.0–10.5)

## 2013-03-24 LAB — CBC
HCT: 31.8 % — ABNORMAL LOW (ref 36.0–46.0)
MCH: 26.3 pg (ref 26.0–34.0)
MCV: 81.3 fL (ref 78.0–100.0)
Platelets: 231 10*3/uL (ref 150–400)
RDW: 15.3 % (ref 11.5–15.5)
WBC: 22.4 10*3/uL — ABNORMAL HIGH (ref 4.0–10.5)

## 2013-03-24 LAB — BASIC METABOLIC PANEL
BUN: 22 mg/dL (ref 6–23)
CO2: 20 mEq/L (ref 19–32)
Calcium: 8.5 mg/dL (ref 8.4–10.5)
Chloride: 110 mEq/L (ref 96–112)
Creatinine, Ser: 0.76 mg/dL (ref 0.50–1.10)
GFR calc non Af Amer: 89 mL/min — ABNORMAL LOW (ref >90)
Glucose, Bld: 123 mg/dL — ABNORMAL HIGH (ref 70–99)

## 2013-03-24 LAB — LEGIONELLA ANTIGEN, URINE: Legionella Antigen, Urine: NEGATIVE

## 2013-03-24 LAB — GLUCOSE, CAPILLARY
Glucose-Capillary: 170 mg/dL — ABNORMAL HIGH (ref 70–99)
Glucose-Capillary: 165 mg/dL — ABNORMAL HIGH (ref 70–99)
Glucose-Capillary: 140 mg/dL — ABNORMAL HIGH (ref 70–99)

## 2013-03-24 LAB — PROCALCITONIN: Procalcitonin: 1.59 ng/mL

## 2013-03-24 MED ORDER — IPRATROPIUM BROMIDE 0.02 % IN SOLN
0.5000 mg | RESPIRATORY_TRACT | Status: DC
  Administered 2013-03-24 – 2013-03-25 (×4): 0.5 mg via RESPIRATORY_TRACT
  Filled 2013-03-24 (×4): qty 2.5

## 2013-03-24 MED ORDER — ALBUTEROL SULFATE (5 MG/ML) 0.5% IN NEBU
2.5000 mg | INHALATION_SOLUTION | RESPIRATORY_TRACT | Status: DC
  Administered 2013-03-24 – 2013-03-25 (×4): 2.5 mg via RESPIRATORY_TRACT
  Filled 2013-03-24 (×4): qty 0.5

## 2013-03-24 MED ORDER — LEVOFLOXACIN 500 MG PO TABS
500.0000 mg | ORAL_TABLET | ORAL | Status: DC
  Administered 2013-03-24: 14:00:00 500 mg via ORAL
  Filled 2013-03-24 (×2): qty 1

## 2013-03-24 NOTE — Progress Notes (Addendum)
PULMONARY  / CRITICAL CARE MEDICINE  Name: Jacqueline Washington MRN: [id number] DOB: 06-Jan-1951    ADMISSION DATE:  03/22/2013 CONSULTATION DATE:  03/22/2013  REFERRING MD :  EDP PRIMARY SERVICE:  PCCM  CHIEF COMPLAINT:  Dyspnea  BRIEF PATIENT DESCRIPTION: 62 y/o immunocompromised (Leflunomide for RA) with emphysema (Dr. Lynelle Doctor office patient) and recurrent pneumonia admitted with increased dyspnea, subjective fevers and new pulmonary infiltrates.   SIGNIFICANT EVENTS / STUDIES:   LINE / TUBES: PIV x 1  CULTURES: U strep 10/7>>>neg U legionella 10/7>>>neg mrsa swab 10/7: neg   ANTIBIOTICS: Ceftriaxone 10/7 x 1 Aztreonam 10/7 >>> Vancomycin 10/7 >>>10/8 Azithromycin 10/7 >>>   SUBJECTIVE:  03/24/13: Obese female sitting up in bed, daughter at bedside. 3L O2 nasal canula in place, 94% O2 sat.   Pt states she feels better, still coughing up white sputum. Severe dyspnea on exertion. Unable to transport to restroom without O2 supplement. Now uses bedpan for urination. Report regular BM this AM.   STAFF Note: Still wheezing but says she wants to go home and feels well enough to go home. [name] scoffed at the idea she is well enough to go home  VITAL SIGNS: Temp:  [97.6 F (36.4 C)-98.7 F (37.1 C)] 97.6 F (36.4 C) (10/09 0507) Pulse Rate:  [83-101] 83 (10/09 0507) Resp:  [22-26] 22 (10/09 0507) BP: (102-111)/(60-71) 102/60 mmHg (10/09 0507) SpO2:  [95 %-97 %] 97 % (10/09 0507)  PHYSICAL EXAMINATION: General:  Obese female sitting up in bed, O2 nasal canula in place Neuro:  Awake, alert, cooperative with examination, non-focal HEENT:  NCAT, PERRL, moist membranes Neck:  Soft, no bruits, no lymphadenopathy, no carotid btuits Cardiovascular:  Tachycardic, regular Lungs: Shallow, diminsihed bilaterally, inspiratory crackles left >>right (STAFF NOTE: Bilateral diffuse wheeze, sounds tight) Abdomen:  Obese, nontender, bowel sounds present, no organomegaly Musculoskeletal:   Moves all extremities, no edema, no digital clubbing Skin:  Intact, no rash  PULMONARY  Recent Labs Lab 03/22/13 1948  PHART 7.464*  PCO2ART 38.2  PO2ART 125.0*  HCO3 27.4*  TCO2 29  O2SAT 99.0    CBC  Recent Labs Lab 03/22/13 1903 03/23/13 0505 03/24/13 0530  HGB 13.0 11.1* 10.3*  HCT 38.6 34.3* 31.8*  WBC 31.2* 17.5* 22.4*  PLT 247 210 231    COAGULATION No results found for this basename: INR,  in the last 168 hours  CARDIAC  No results found for this basename: TROPONINI,  in the last 168 hours  Recent Labs Lab 03/22/13 1903  PROBNP 462.9*     CHEMISTRY  Recent Labs Lab 03/22/13 1903 03/23/13 0505 03/24/13 0530  NA 138 139 142  K 4.0 3.8 4.4  CL 101 105 110  CO2 27 22 20   GLUCOSE 119* 201* 123*  BUN 15 15 22   CREATININE 0.89 0.92 0.76  CALCIUM 9.0 8.6 8.5   Estimated Creatinine Clearance: 92.2 ml/min (by C-G formula based on Cr of 0.76).   LIVER No results found for this basename: AST, ALT, ALKPHOS, BILITOT, PROT, ALBUMIN, INR,  in the last 168 hours   INFECTIOUS  Recent Labs Lab 03/22/13 2225 03/24/13 0530  PROCALCITON 1.53 1.59     ENDOCRINE CBG (last 3)   Recent Labs  03/23/13 1719 03/23/13 2022 03/24/13 0753  GLUCAP 126* 144* 138*         IMAGING x48h  Dg Chest Port 1 View  03/23/2013   CLINICAL DATA:  Intubation, history diabetes, emphysema, asthma, rheumatoid arthritis  EXAM: PORTABLE CHEST - 1  VIEW  COMPARISON:  Portable exam 0657 hr compared to 03/22/2013  FINDINGS: Stable heart size and mediastinal contours.  Right upper lobe collapse/volume loss/scarring unchanged.  Extensive infiltrate greatest in left upper lobe little changed.  Slight decrease lung volumes.  No gross pleural effusion or pneumothorax.  IMPRESSION: Persistent pulmonary infiltrates greatest in left upper lobe.  Underlying right upper lobe collapse/scarring.   Electronically Signed   By: Ulyses Southward M.D.   On: 03/23/2013 08:20   Dg Chest  Portable 1 View  03/22/2013   CLINICAL DATA:  Initial encounter for current episode of shortness of breath.  EXAM: PORTABLE CHEST - 1 VIEW  COMPARISON:  CT chest 03/09/2013. Two-view chest x-ray 03/01/2013, 11/30/2012, 11/02/2012.  FINDINGS: Cardiac silhouette upper normal in size, unchanged. Pleuroparenchymal scarring in the right apex with retraction of the hilum superiorly and deviation of the trachea to the right, unchanged. New airspace opacities in the left upper lobe, superimposed upon severe emphysematous changes and pleuroparenchymal scarring at the left apex. No new pulmonary parenchymal abnormalities elsewhere.  IMPRESSION: Pneumonia involving the left upper lobe, superimposed upon pleuroparenchymal scarring and emphysema. Stable dense pleuroparenchymal scarring in the right upper lobe with retraction of the right hilum superiorly and deviation of the trachea to the right.   Electronically Signed   By: Hulan Saas M.D.   On: 03/22/2013 18:36        ASSESSMENT / PLAN:  Pulmonary A: Recurrent pneumonia in immunocompromised host-Urine Strep and Legionella Ag negative 10/8 COPD, acute exacerbation Acute on chronic respiratory failure  03/24/13: still wheezing though subjectively improved  P:  - Continue ABX, see ID -dc mdi Dulera / Spiriva / Albuterol - start NEB  -  Solu-Medrol - Supplemental oxygen - GET CT chest and compare to Sept 2014  Cardiovascular A: Chronic diastolic heart failure without exacerbation P: - Heparin for DVT Px  - Continue Lasix therapy  =- check troponin and if normal dc tele 03/25/13  A: Rheumatoid arthritis,  P:  - Hold Avara while acutely ill  - Maintain hand hygiene   Endocrine A: Hypothyroidism Fasting hyperglycemia. This is likely due to pulse steroids.  P:  - Continue levothyroxine  - Check a1c - SSI  GI A: No acute issues P: - Carb modified diet - SSI  ID A:  PNA in immune compromissed host P: -continue  aztreonam - change azithro to levofloxacin, follow clinical course, pt afebrile with increasing WBC, will await sputum culture (staff note) -repeat CT chst for new left infiltrates -No need to check quantiferon TB gold; not helpful in acute setting but more for LTBI    Anders Simmonds NP and Cephus Shelling, PA-student, [facility] Pulmonary and Critical Care Medicine [facility] Pager: [phone]  03/24/2013, 11:54 AM     STAFF NOTE See staff comments   Dr. Kalman Shan, M.D., [facility].C.P Pulmonary and Critical Care Medicine Staff Physician [facility] Pager: [phone], If no answer or between  15:00h - 7:00h: call [phone]  03/24/2013 12:04 PM

## 2013-03-25 DIAGNOSIS — R739 Hyperglycemia, unspecified: Secondary | ICD-10-CM

## 2013-03-25 LAB — BASIC METABOLIC PANEL
Calcium: 8.7 mg/dL (ref 8.4–10.5)
Chloride: 108 mEq/L (ref 96–112)
GFR calc Af Amer: 83 mL/min — ABNORMAL LOW (ref >90)
GFR calc non Af Amer: 72 mL/min — ABNORMAL LOW (ref >90)
Glucose, Bld: 141 mg/dL — ABNORMAL HIGH (ref 70–99)
Potassium: 3.7 mEq/L (ref 3.5–5.1)
Sodium: 144 mEq/L (ref 135–145)

## 2013-03-25 LAB — MAGNESIUM: Magnesium: 2.2 mg/dL (ref 1.5–2.5)

## 2013-03-25 LAB — LEGIONELLA ANTIGEN, URINE

## 2013-03-25 LAB — CBC
HCT: 29.8 % — ABNORMAL LOW (ref 36.0–46.0)
Hemoglobin: 9.6 g/dL — ABNORMAL LOW (ref 12.0–15.0)
MCHC: 32.2 g/dL (ref 30.0–36.0)
RBC: 3.66 MIL/uL — ABNORMAL LOW (ref 3.87–5.11)
WBC: 16 10*3/uL — ABNORMAL HIGH (ref 4.0–10.5)

## 2013-03-25 LAB — GLUCOSE, CAPILLARY: Glucose-Capillary: 138 mg/dL — ABNORMAL HIGH (ref 70–99)

## 2013-03-25 LAB — PRO B NATRIURETIC PEPTIDE: Pro B Natriuretic peptide (BNP): 1150 pg/mL — ABNORMAL HIGH (ref 0–125)

## 2013-03-25 MED ORDER — ALBUTEROL SULFATE (5 MG/ML) 0.5% IN NEBU: 2.5000 mg | INHALATION_SOLUTION | Freq: Four times a day (QID) | RESPIRATORY_TRACT | Status: DC

## 2013-03-25 MED ORDER — INSULIN ASPART 100 UNIT/ML ~~LOC~~ SOLN
0.0000 [IU] | Freq: Three times a day (TID) | SUBCUTANEOUS | Status: DC
  Administered 2013-03-25: 09:00:00 2 [IU] via SUBCUTANEOUS

## 2013-03-25 MED ORDER — CLINDAMYCIN HCL 300 MG PO CAPS: 300.0000 mg | ORAL_CAPSULE | Freq: Three times a day (TID) | ORAL | Status: DC

## 2013-03-25 MED ORDER — IPRATROPIUM BROMIDE 0.02 % IN SOLN: 0.5000 mg | Freq: Four times a day (QID) | RESPIRATORY_TRACT | Status: DC

## 2013-03-25 MED ORDER — CIPROFLOXACIN HCL 500 MG PO TABS: 500.0000 mg | ORAL_TABLET | Freq: Two times a day (BID) | ORAL | Status: DC

## 2013-03-25 MED ORDER — HYDROCODONE-ACETAMINOPHEN 5-325 MG PO TABS: 1.0000 | ORAL_TABLET | Freq: Four times a day (QID) | ORAL | Status: DC | PRN

## 2013-03-25 MED ORDER — PREDNISONE 10 MG PO TABS: ORAL_TABLET | ORAL | Status: DC

## 2013-03-25 MED ORDER — BECLOMETHASONE DIPROPIONATE 80 MCG/ACT IN AERS: 2.0000 | INHALATION_SPRAY | Freq: Two times a day (BID) | RESPIRATORY_TRACT | Status: DC

## 2013-03-25 NOTE — Progress Notes (Signed)
ANTIBIOTIC CONSULT NOTE - FOLLOW UP  Pharmacy Consult for Azactam Indication: pneumonia  Allergies  Allergen Reactions  . Penicillins Rash  . Sulfonamide Derivatives Rash  . Tetanus Toxoid Other (See Comments)    Knot on arm    Patient Measurements: Height: 5\' 4"  (162.6 cm) Weight: 209 lb 7 oz (95 kg) IBW/kg (Calculated) : 54.7 Adjusted Body Weight:    Vital Signs: Temp: 97.8 F (36.6 C) (10/10 0605) Temp src: Oral (10/10 0605) BP: 146/67 mmHg (10/10 0605) Pulse Rate: 86 (10/10 0605) Intake/Output from previous day: 10/09 0701 - 10/10 0700 In: 760 [P.O.:560; IV Piggyback:200] Out: 200 [Urine:200] Intake/Output from this shift:    Labs:  Recent Labs  03/23/13 0505 03/24/13 0530 03/24/13 1157 03/25/13 0100 03/25/13 0601  WBC 17.5* 22.4* 22.0* 16.0*  --   HGB 11.1* 10.3* 11.4* 9.6*  --   PLT 210 231 236 216  --   CREATININE 0.92 0.76  --   --  0.85   Estimated Creatinine Clearance: 76.7 ml/min (by C-G formula based on Cr of 0.85). No results found for this basename: VANCOTROUGH, Leodis Binet, VANCORANDOM, GENTTROUGH, GENTPEAK, GENTRANDOM, TOBRATROUGH, TOBRAPEAK, TOBRARND, AMIKACINPEAK, AMIKACINTROU, AMIKACIN,  in the last 72 hours   Microbiology: Recent Results (from the past 720 hour(s))  MRSA PCR SCREENING     Status: None   Collection Time    03/23/13  6:30 AM      Result Value Range Status   MRSA by PCR NEGATIVE  NEGATIVE Final   Comment:            The GeneXpert MRSA Assay (FDA     approved for NASAL specimens     only), is one component of a     comprehensive MRSA colonization     surveillance program. It is not     intended to diagnose MRSA     infection nor to guide or     monitor treatment for     MRSA infections.    Anti-infectives   Start     Dose/Rate Route Frequency Ordered Stop   03/24/13 1400  levofloxacin (LEVAQUIN) tablet 500 mg     500 mg Oral Every 24 hours 03/24/13 1204     03/23/13 2300  aztreonam (AZACTAM) 2 g in dextrose 5 % 50  mL IVPB     2 g 100 mL/hr over 30 Minutes Intravenous 3 times per day 03/22/13 2131     03/23/13 2200  vancomycin (VANCOCIN) IVPB 1000 mg/200 mL premix  Status:  Discontinued     1,000 mg 200 mL/hr over 60 Minutes Intravenous Every 12 hours 03/23/13 0950 03/23/13 1206   03/23/13 1000  vancomycin (VANCOCIN) 2,000 mg in sodium chloride 0.9 % 500 mL IVPB  Status:  Discontinued     2,000 mg 250 mL/hr over 120 Minutes Intravenous NOW 03/23/13 0950 03/23/13 1206   03/23/13 0000  azithromycin (ZITHROMAX) 500 mg in dextrose 5 % 250 mL IVPB  Status:  Discontinued     500 mg 250 mL/hr over 60 Minutes Intravenous Every 24 hours 03/22/13 2045 03/24/13 1204   03/22/13 2200  vancomycin (VANCOCIN) IVPB 1000 mg/200 mL premix  Status:  Discontinued     1,000 mg 200 mL/hr over 60 Minutes Intravenous  Once 03/22/13 2131 03/23/13 1002   03/22/13 1915  cefTRIAXone (ROCEPHIN) 1 g in dextrose 5 % 50 mL IVPB     1 g 100 mL/hr over 30 Minutes Intravenous  Once 03/22/13 1905 03/22/13 2055   03/22/13 1915  azithromycin (ZITHROMAX) 500 mg in dextrose 5 % 250 mL IVPB     500 mg 250 mL/hr over 60 Minutes Intravenous  Once 03/22/13 1905 03/22/13 2004   03/22/13 1915  vancomycin (VANCOCIN) IVPB 1000 mg/200 mL premix     1,000 mg 200 mL/hr over 60 Minutes Intravenous  Once 03/22/13 1905 03/22/13 2055      Assessment: progressive SOB  Anticoagulation: None PTA - sq heparin for dvt px. Hgb down to 9.6.  Infectious Disease: Aztreonam - for recurrent PNA, COPD exac in immunocompromised pt; multiple antibiotic allergies; Afeb. WBC down to 16, no cxs (CCM requesting sputum cx); PCT 1.53 -->1.59  10/7 vanc>>10/8 10/7 aztreonam>> 10/7 azith>>10/8 LQ 10/8>>  Strep Pneumo Ag (-)  Cardiovascular: hx HTN, HTN; VSS On lasix, K+, Magox  Endocrinology: hypothyroidism on home Synthroid, Gout on allopurinol; DM2 - CBGs 138-170, on SSI ,no DM meds PTA. Tapering solumedrol  Gastrointestinal / Nutrition: Obesity. Vit C, Vit  D, Resource, FA, MVI, PO PPI, B12  Neurology: RA. Holding Arava  Nephrology: SCr 0.85(stable), Crcl 77; lytes wnl. Lytes ok. On Mgox and K scheduled as PTA.  Pulmonary: 3L [place]. emphysema, asthma, COPD. Solumedrol 80 TID for COPD exac - tapering On Claritin, alb/ipratr, montelukast, tiotropium, guaifenesin. Still c/o dyspnea/wheezing  Hematology / Oncology: H/H trending down, plts stable  PTA Medication Issues: Leflunamide for RA  Best Practices: SQ hep, po PPI   Plan:  1. Continue Aztreonam 2g IV q8h dose ok 2. Azith changed to LQ PO 500/24h. Dose ok 3. Monitor renal function, cultures, clinical course and adjust as indicated   Milliani Herrada S. Merilynn Finland, PharmD, [facility] Clinical Staff Pharmacist Pager [phone]  Misty Stanley Stillinger 03/25/2013,9:46 AM

## 2013-03-25 NOTE — Care Management Note (Signed)
    Page 1 of 1   03/25/2013     12:48:24 PM   CARE MANAGEMENT NOTE 03/25/2013  Patient:  Jacqueline Washington, Jacqueline Washington   Account Number:  [id number]  Date Initiated:  03/23/2013  Documentation initiated by:  Donn Pierini  Subjective/Objective Assessment:   Pt admitted with recurrent PNA- resp distress     Action/Plan:   PTA pt lived at home with spouse- NCM to follow for d/c needs   Anticipated DC Date:  03/25/2013   Anticipated DC Plan:  HOME W HOME HEALTH SERVICES      DC Planning Services  CM consult      Choice offered to / List presented to:             Status of service:  Completed, signed off Medicare Important Message given?   (If response is "NO", the following Medicare IM given date fields will be blank) Date Medicare IM given:   Date Additional Medicare IM given:    Discharge Disposition:  HOME/SELF CARE  Per UR Regulation:  Reviewed for med. necessity/level of care/duration of stay  If discussed at Long Length of Stay Meetings, dates discussed:    Comments:  03/25/13 12:47 Letha Cape RN, BSN [phone] patient dc to home today, no needs anticipated.

## 2013-03-25 NOTE — Discharge Summary (Signed)
Physician Discharge Summary     Patient ID: Jacqueline Washington MRN: [id number] DOB/AGE: May 08, 1951 62 y.o.  Admit date: 03/22/2013 Discharge date: 03/25/2013  Discharge Diagnoses:  Patient Active Problem List   Diagnosis Date Noted  . Hyperglycemia 03/25/2013  . Acute-on-chronic respiratory failure 03/23/2013  . COPD exacerbation 03/23/2013  . Recurrent pneumonia 03/23/2013  . Chronic diastolic CHF (congestive heart failure) 03/23/2013  . Rheumatoid arthritis(714.0) 11/08/2012  . Chronic diastolic heart failure, NYHA class 1 09/22/2012  . Copd Gold C  04/22/2007   Detailed Hospital Course:   62 y/o immunocompromised (Leflunomide for RA) with emphysema (Dr. Lynelle Doctor office patient) and recurrent pneumonia admitted with increased dyspnea, subjective fevers and new pulmonary infiltrates. Patient reported having symptoms for one week. She developed progressive worsening of dyspnea as well as her cough became productive for yellow sputum. She had subjective fever but she did not check her temperature. She had increased oxygen requirements since several weeks ago. She was treated with antibiotics and systemic steroids without significant improvement. She reports having an acute admission to the hospital in May of 2014 for pneumonia and spontaneous pneumothorax. She also reports having pneumonia in January of 2014 for which she did not seek medical attention. Overall she describes deterioration of her respiratory status since about 2 years ago. She denies any chest pain or palpitations.She was found to have new left upper lobe airspace disease and still chronic RUL PNA. She was admitted for PNA in immunosuppressed host.   She was admitted initially to the step down unit setting. Therapeutic interventions included: empiric antibiotics, systemic steroids, scheduled bronchodilators and supplemental oxygen. We repeated a CT of chest on 10/9, this showed some improvement in the RUL PNA and verified the new  airspace disease on the left. Her respiratory status was slow but progressively improved. On 10/9 we notes she still had significant wheeze. We were concerned that she may not be getting adequate effect from her advair and spiriva. Because of this we placed her on a trial of albuterol and atrovent. Her symptoms and wheezes seemed to do better with this. She was subjectively  80% baseline respiratory status on time of d/c and was eager to go home. Her d/c plan will be as described below.     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  COPD, acute exacerbation  P:  - home on 14 days of clinda and Cipro (necortizing PNA and pt addiment that she does not respond to Levaquin) - d/c MDIs and send her home on duoneb and QVAR  - slow pred taper  - Home on oxygen 3 liters - Supplemental oxygen  - will need frequent 2 week f/u in the out-pt setting  - will hold avara   Chronic diastolic heart failure without exacerbation  P:  - Heparin for DVT Px  - Continue Lasix therapy   Rheumatoid arthritis  P:  - hold Avara d/t concern about immunosuppression  - Maintain hand hygiene   Hyperglycemia  P: Taper steroids    Significant Hospital tests/ studies/ interventions and procedures  10/7 IgG (Immunoglobin G), Serum 690 - 1700 mg/dL  295 (L)   IgA 69 - 284 mg/dL  132   IgM, Serum 52 - 322 mg/dL  71    44/0: CT chest: 1. Interval mild left upper lobe pneumonia. 2. Stable dense right upper lobe pneumonia/chronic atelectasis. 3. Mildly improved focal probable pneumonia in the right lower lobe. 4. Mildly improved  bilateral nodularity, compatible with a mildly improving infectious process. 5. Stable mild mediastinal adenopathy, most likely reactive.  6. Stable extensive changes of COPD and cylindrical bronchiectasis. 7. Small left pleural effusion with a minimal increase in amount. 8. Results right pleural effusion.   Discharge  Exam: BP 146/67  Pulse 86  Temp(Src) 97.8 F (36.6 C) (Oral)  Resp 24  Ht 5\' 4"  (1.626 m)  Wt 95 kg (209 lb 7 oz)  BMI 35.93 kg/m2  SpO2 95% 3 liters General: Obese female sitting up in bed, O2 nasal canula in place  Neuro: Awake, alert, cooperative with examination, non-focal  HEENT: NCAT, PERRL, moist membranes  Neck: Soft, no bruits, no lymphadenopathy, no carotid btuits  Cardiovascular: Tachycardic, regular  Lungs: Shallow, diminsihed bilaterally, inspiratory crackles left >>right  Abdomen: Obese, nontender, bowel sounds present, no organomegaly  Musculoskeletal: Moves all extremities, no edema, no digital clubbing  Skin: Intact, no rash   Labs at discharge Lab Results  Component Value Date   CREATININE 0.85 03/25/2013   BUN 28* 03/25/2013   NA 144 03/25/2013   K 3.7 03/25/2013   CL 108 03/25/2013   CO2 24 03/25/2013   Lab Results  Component Value Date   WBC 16.0* 03/25/2013   HGB 9.6* 03/25/2013   HCT 29.8* 03/25/2013   MCV 81.4 03/25/2013   PLT 216 03/25/2013   Lab Results  Component Value Date   ALT 24 11/10/2012   AST 44* 11/10/2012   ALKPHOS 73 11/10/2012   BILITOT 0.7 11/10/2012   No results found for this basename: INR,  PROTIME   CBG (last 3)   Recent Labs  03/24/13 1726 03/24/13 2113 03/25/13 0756  GLUCAP 165* 140* 138*    Current radiology studies Ct Chest Wo Contrast  03/24/2013   CLINICAL DATA:  Recurrent pneumonia. Increased dyspnea.  EXAM: CT CHEST WITHOUT CONTRAST  TECHNIQUE: Multidetector CT imaging of the chest was performed following the standard protocol without IV contrast.  COMPARISON:  Portable chest obtained yesterday and chest CT dated 03/09/2013.  FINDINGS: No significant change in dense airspace consolidation in the right upper lobe with bullous changes and cylindrical bronchiectasis. Interval smaller area of confluent opacification in the anterior aspect of the left upper lobe in an area of bullous change and cylindrical  bronchiectasis.  Extensive bullous changes are again demonstrated bilaterally. There is a small area of dense consolidation in the posterior aspect of the right lower lobe. Interval mild decrease in size of multiple small nodules in both lungs. Small left pleural effusion with a minimal increase in amount. No pleural fluid is seen on the right at this time.  Previously demonstrated 10 mm short axis low right peritracheal lymph node on the right has an 11 mm short axis on image 15. A more superior no located right anterior paratracheal lymph node has a short axis diameter of 10 mm on image number 12, previously measuring 12 mm on image 13. No significant change in other mildly prominent mediastinal lymph nodes.  Mild thoracic spine degenerative changes. Unremarkable upper abdomen.  IMPRESSION: 1. Interval mild left upper lobe pneumonia. 2. Stable dense right upper lobe pneumonia/chronic atelectasis. 3. Mildly improved focal probable pneumonia in the right lower lobe. 4. Mildly improved bilateral nodularity, compatible with a mildly improving infectious process. 5. Stable mild mediastinal adenopathy, most likely reactive. 6. Stable extensive changes of COPD and cylindrical bronchiectasis. 7. Small left pleural effusion with a minimal increase in amount. 8. Results right pleural effusion.   Electronically  Signed   By: Gordan Payment M.D.   On: 03/24/2013 19:48    Disposition:  01-Home or Self Care      Discharge Orders   Future Appointments Provider Department Dept Phone   03/31/2013 9:15 AM Storm Frisk, MD [facility] [phone]   Future Orders Complete By Expires   Diet - low sodium heart healthy  As directed    Discharge instructions  As directed    Comments:     Do Not use: Spiriva, budesonide, brovana or  advair (hold on to what you have in case Dr Delford Field wants to re-trial them)  Use the albuterol and ipratropium 4 times a day: morning, lunch, dinner and bedtime. Use QVAR 2 puffs  in morning and 2 puffs before bed-->rinse out your mouth after.   Take the antibiotics as prescribed and complete the course (clindamycin and cipro)  Be sure to alert our office if you develop frequent diarrhea (very important)  Recommend at least 2 servings of yogurt daily while on antibiotics and continue for at least a week after. (this helps avoid secondary infections)   Increase activity slowly  As directed        Medication List    STOP taking these medications       arformoterol 15 MCG/2ML Nebu  Commonly known as:  BROVANA     budesonide 0.25 MG/2ML nebulizer solution  Commonly known as:  PULMICORT     Fluticasone-Salmeterol 500-50 MCG/DOSE Aepb  Commonly known as:  ADVAIR DISKUS     leflunomide 10 MG tablet  Commonly known as:  ARAVA     tiotropium 18 MCG inhalation capsule  Commonly known as:  SPIRIVA      TAKE these medications       albuterol (2.5 MG/3ML) 0.083% nebulizer solution  Commonly known as:  PROVENTIL  Take 3 mLs (2.5 mg total) by nebulization every 4 (four) hours as needed for wheezing or shortness of breath.     albuterol (5 MG/ML) 0.5% nebulizer solution  Commonly known as:  PROVENTIL  Take 0.5 mLs (2.5 mg total) by nebulization 4 (four) times daily.     allopurinol 100 MG tablet  Commonly known as:  ZYLOPRIM  Take 100 mg by mouth daily.     beclomethasone 80 MCG/ACT inhaler  Commonly known as:  QVAR  Inhale 2 puffs into the lungs 2 (two) times daily.     cetirizine 10 MG tablet  Commonly known as:  ZYRTEC  Take 10 mg by mouth daily.     ciprofloxacin 500 MG tablet  Commonly known as:  CIPRO  Take 1 tablet (500 mg total) by mouth 2 (two) times daily.     clindamycin 300 MG capsule  Commonly known as:  CLEOCIN  Take 1 capsule (300 mg total) by mouth 3 (three) times daily.     CYANOCOBALAMIN PO  Take 1 tablet by mouth every Monday, Wednesday, and Friday.     DSS 100 MG Caps  Take 100 mg by mouth 2 (two) times daily.     feeding  supplement (RESOURCE BREEZE) Liqd  Take 1 Container by mouth 3 (three) times daily between meals.     fluticasone 50 MCG/ACT nasal spray  Commonly known as:  FLONASE  Place 1 spray into both nostrils daily as needed for allergies.     folic acid 1 MG tablet  Commonly known as:  FOLVITE  Take 2 mg by mouth daily.     furosemide 40 MG  tablet  Commonly known as:  LASIX  Take 40 mg by mouth daily.     guaiFENesin 600 MG 12 hr tablet  Commonly known as:  MUCINEX  Take 1 tablet (600 mg total) by mouth 2 (two) times daily.     HYDROcodone-acetaminophen 5-325 MG per tablet  Commonly known as:  NORCO/VICODIN  Take 1 tablet by mouth every 6 (six) hours as needed for pain (pain or cough).     ipratropium 0.02 % nebulizer solution  Commonly known as:  ATROVENT  Take 2.5 mLs (0.5 mg total) by nebulization 4 (four) times daily.     levothyroxine 50 MCG tablet  Commonly known as:  SYNTHROID, LEVOTHROID  Take 50 mcg by mouth daily before breakfast.     MAGNESIUM-ZINC PO  Take 2 tablets by mouth daily.     montelukast 10 MG tablet  Commonly known as:  SINGULAIR  Take 1 tablet by mouth daily.     multivitamin capsule  Take 1 capsule by mouth daily.     nystatin cream  Commonly known as:  MYCOSTATIN  Apply 1 application topically daily as needed (under breasts).     omeprazole 20 MG capsule  Commonly known as:  PRILOSEC  Take 1 capsule (20 mg total) by mouth daily.     ondansetron 4 MG tablet  Commonly known as:  ZOFRAN  Take 1 tablet (4 mg total) by mouth every 8 (eight) hours as needed for nausea.     potassium chloride SA 20 MEQ tablet  Commonly known as:  K-DUR,KLOR-CON  Take 60 mEq by mouth daily.     predniSONE 10 MG tablet  Commonly known as:  DELTASONE  Take 4 tabs  daily with food x 4 days, then 3 tabs daily x 4 days, then 2 tabs daily x 4 days, then 1 tab daily x4 days then stop. #40     vitamin C 1000 MG tablet  Take 1,000 mg by mouth daily.     Vitamin D-3  5000 UNITS Tabs  Take 5,000 Units by mouth daily.       Follow-up Information   Follow up with PARRETT,TAMMY, NP On 04/14/2013. (10 am )    Specialty:  Nurse Practitioner   Contact information:   [address] [place] [address] [phone]       Discharged Condition: fair, reports she is at 80% baseline status   Physician Statement:   The Patient was personally examined, the discharge assessment and plan has been personally reviewed and I agree with ACNP Babcock's assessment and plan. > 30 minutes of time have been dedicated to discharge assessment, planning and discharge instructions.   Signed: BABCOCK,PETE 03/25/2013, 10:16 AM   Staff Dr. Kalman Shan, M.D., F.C.C.P Pulmonary and Critical Care Medicine Staff Physician [facility] Pager: [phone], If no answer or between  15:00h - 7:00h: call [phone]  03/29/2013 10:16 PM

## 2013-03-25 NOTE — Progress Notes (Signed)
NURSING PROGRESS NOTE  MERANDA DECHAINE [id number] Discharge Data: 03/25/2013 11:35 AM Attending Provider: Lonia Farber* XLK:[id number] DAVID, MD     Elta Guadeloupe to be D/C'd Home per MD order.  Discussed with the patient the After Visit Summary and all questions fully answered. All IV's discontinued with no bleeding noted. All belongings returned to patient for patient to take home.  Pt refused flu vac stated she would get shot at MD office after d/c.  Last Vital Signs:  Blood pressure 146/67, pulse 86, temperature 97.8 F (36.6 C), temperature source Oral, resp. rate 24, height 5\' 4"  (1.626 m), weight 95 kg (209 lb 7 oz), SpO2 95.00%.  Discharge Medication List   Medication List    STOP taking these medications       arformoterol 15 MCG/2ML Nebu  Commonly known as:  BROVANA     budesonide 0.25 MG/2ML nebulizer solution  Commonly known as:  PULMICORT     Fluticasone-Salmeterol 500-50 MCG/DOSE Aepb  Commonly known as:  ADVAIR DISKUS     leflunomide 10 MG tablet  Commonly known as:  ARAVA     tiotropium 18 MCG inhalation capsule  Commonly known as:  SPIRIVA      TAKE these medications       albuterol (2.5 MG/3ML) 0.083% nebulizer solution  Commonly known as:  PROVENTIL  Take 3 mLs (2.5 mg total) by nebulization every 4 (four) hours as needed for wheezing or shortness of breath.     albuterol (5 MG/ML) 0.5% nebulizer solution  Commonly known as:  PROVENTIL  Take 0.5 mLs (2.5 mg total) by nebulization 4 (four) times daily.     allopurinol 100 MG tablet  Commonly known as:  ZYLOPRIM  Take 100 mg by mouth daily.     beclomethasone 80 MCG/ACT inhaler  Commonly known as:  QVAR  Inhale 2 puffs into the lungs 2 (two) times daily.     cetirizine 10 MG tablet  Commonly known as:  ZYRTEC  Take 10 mg by mouth daily.     ciprofloxacin 500 MG tablet  Commonly known as:  CIPRO  Take 1 tablet (500 mg total) by mouth 2 (two) times daily.     clindamycin  300 MG capsule  Commonly known as:  CLEOCIN  Take 1 capsule (300 mg total) by mouth 3 (three) times daily.     CYANOCOBALAMIN PO  Take 1 tablet by mouth every Monday, Wednesday, and Friday.     DSS 100 MG Caps  Take 100 mg by mouth 2 (two) times daily.     feeding supplement (RESOURCE BREEZE) Liqd  Take 1 Container by mouth 3 (three) times daily between meals.     fluticasone 50 MCG/ACT nasal spray  Commonly known as:  FLONASE  Place 1 spray into both nostrils daily as needed for allergies.     folic acid 1 MG tablet  Commonly known as:  FOLVITE  Take 2 mg by mouth daily.     furosemide 40 MG tablet  Commonly known as:  LASIX  Take 40 mg by mouth daily.     guaiFENesin 600 MG 12 hr tablet  Commonly known as:  MUCINEX  Take 1 tablet (600 mg total) by mouth 2 (two) times daily.     HYDROcodone-acetaminophen 5-325 MG per tablet  Commonly known as:  NORCO/VICODIN  Take 1 tablet by mouth every 6 (six) hours as needed for pain (pain or cough).     ipratropium 0.02 % nebulizer solution  Commonly known as:  ATROVENT  Take 2.5 mLs (0.5 mg total) by nebulization 4 (four) times daily.     levothyroxine 50 MCG tablet  Commonly known as:  SYNTHROID, LEVOTHROID  Take 50 mcg by mouth daily before breakfast.     MAGNESIUM-ZINC PO  Take 2 tablets by mouth daily.     montelukast 10 MG tablet  Commonly known as:  SINGULAIR  Take 1 tablet by mouth daily.     multivitamin capsule  Take 1 capsule by mouth daily.     nystatin cream  Commonly known as:  MYCOSTATIN  Apply 1 application topically daily as needed (under breasts).     omeprazole 20 MG capsule  Commonly known as:  PRILOSEC  Take 1 capsule (20 mg total) by mouth daily.     ondansetron 4 MG tablet  Commonly known as:  ZOFRAN  Take 1 tablet (4 mg total) by mouth every 8 (eight) hours as needed for nausea.     potassium chloride SA 20 MEQ tablet  Commonly known as:  K-DUR,KLOR-CON  Take 60 mEq by mouth daily.      predniSONE 10 MG tablet  Commonly known as:  DELTASONE  Take 4 tabs  daily with food x 4 days, then 3 tabs daily x 4 days, then 2 tabs daily x 4 days, then 1 tab daily x4 days then stop. #40     vitamin C 1000 MG tablet  Take 1,000 mg by mouth daily.     Vitamin D-3 5000 UNITS Tabs  Take 5,000 Units by mouth daily.

## 2013-03-31 ENCOUNTER — Other Ambulatory Visit (INDEPENDENT_AMBULATORY_CARE_PROVIDER_SITE_OTHER): Payer: Medicare Other

## 2013-03-31 ENCOUNTER — Ambulatory Visit (INDEPENDENT_AMBULATORY_CARE_PROVIDER_SITE_OTHER)
Admission: RE | Admit: 2013-03-31 | Discharge: 2013-03-31 | Disposition: A | Discharge status: Home / Self Care | Payer: Medicare Other | Source: Ambulatory Visit | Attending: Critical Care Medicine | Admitting: Critical Care Medicine

## 2013-03-31 ENCOUNTER — Encounter: Payer: Self-pay | Admitting: Critical Care Medicine

## 2013-03-31 ENCOUNTER — Ambulatory Visit (INDEPENDENT_AMBULATORY_CARE_PROVIDER_SITE_OTHER): Payer: Medicare Other | Admitting: Critical Care Medicine

## 2013-03-31 VITALS — BP 118/76 | HR 77 | Temp 98.0°F | Ht 64.0 in | Wt 204.8 lb

## 2013-03-31 DIAGNOSIS — J9610 Chronic respiratory failure, unspecified whether with hypoxia or hypercapnia: Secondary | ICD-10-CM

## 2013-03-31 DIAGNOSIS — Z23 Encounter for immunization: Secondary | ICD-10-CM

## 2013-03-31 DIAGNOSIS — J209 Acute bronchitis, unspecified: Secondary | ICD-10-CM

## 2013-03-31 DIAGNOSIS — J189 Pneumonia, unspecified organism: Secondary | ICD-10-CM

## 2013-03-31 DIAGNOSIS — J440 Chronic obstructive pulmonary disease with acute lower respiratory infection: Secondary | ICD-10-CM

## 2013-03-31 DIAGNOSIS — E876 Hypokalemia: Secondary | ICD-10-CM

## 2013-03-31 DIAGNOSIS — J9620 Acute and chronic respiratory failure, unspecified whether with hypoxia or hypercapnia: Secondary | ICD-10-CM | POA: Insufficient documentation

## 2013-03-31 LAB — CBC WITH DIFFERENTIAL/PLATELET
Basophils Absolute: 0 10*3/uL (ref 0.0–0.1)
Basophils Relative: 0 % (ref 0.0–3.0)
Eosinophils Absolute: 0 10*3/uL (ref 0.0–0.7)
Eosinophils Relative: 0.3 % (ref 0.0–5.0)
Hemoglobin: 11.1 g/dL — ABNORMAL LOW (ref 12.0–15.0)
Lymphocytes Relative: 3.9 % — ABNORMAL LOW (ref 12.0–46.0)
Lymphs Abs: 0.5 10*3/uL — ABNORMAL LOW (ref 0.7–4.0)
MCHC: 32.5 g/dL (ref 30.0–36.0)
MCV: 80.2 fl (ref 78.0–100.0)
Monocytes Relative: 8.4 % (ref 3.0–12.0)
Neutro Abs: 11.8 10*3/uL — ABNORMAL HIGH (ref 1.4–7.7)
Platelets: 233 10*3/uL (ref 150.0–400.0)
RDW: 16.6 % — ABNORMAL HIGH (ref 11.5–14.6)
WBC: 13.5 10*3/uL — ABNORMAL HIGH (ref 4.5–10.5)

## 2013-03-31 LAB — BASIC METABOLIC PANEL
BUN: 15 mg/dL (ref 6–23)
CO2: 29 mEq/L (ref 19–32)
Calcium: 8.7 mg/dL (ref 8.4–10.5)
GFR: 73.93 mL/min (ref >60.00)
Glucose, Bld: 97 mg/dL (ref 70–99)

## 2013-03-31 MED ORDER — FLUTTER DEVI: Status: DC

## 2013-03-31 NOTE — Progress Notes (Signed)
Quick Note:  Call pt and tell her labs are ok, No change in medications Potassium level normal ______

## 2013-03-31 NOTE — Patient Instructions (Signed)
Finish cipro and cleocin Finish prednisone Labs today Chest xray today Use flutter valve three times daily Stay off arava Flu vaccine was given You should get a shingles vaccine Return 2 months

## 2013-03-31 NOTE — Progress Notes (Signed)
Subjective:    Patient ID: Jacqueline Washington, female    DOB: 10-27-1950, 62 y.o.   MRN: [id number]  HPI  03/31/2013 Chief Complaint  Patient presents with  . Post Hospital Follow up    Was d/c's from [facility] on 10/10. Breathing has improved since d/c but not back to baseline.  Has left side pain when inhaling.  Prod cough with white to yellow mucus.    Pt in hosp again 03/2013: Detailed Hospital Course:  62 y/o immunocompromised (Leflunomide for RA) with emphysema (Dr. Lynelle Doctor office patient) and recurrent pneumonia admitted with increased dyspnea, subjective fevers and new pulmonary infiltrates. Patient reported having symptoms for one week. She developed progressive worsening of dyspnea as well as her cough became productive for yellow sputum. She had subjective fever but she did not check her temperature. She had increased oxygen requirements since several weeks ago. She was treated with antibiotics and systemic steroids without significant improvement. She reports having an acute admission to the hospital in May of 2014 for pneumonia and spontaneous pneumothorax. She also reports having pneumonia in January of 2014 for which she did not seek medical attention. Overall she describes deterioration of her respiratory status since about 2 years ago. She denies any chest pain or palpitations.She was found to have new left upper lobe airspace disease and still chronic RUL PNA. She was admitted for PNA in immunosuppressed host.  She was admitted initially to the step down unit setting. Therapeutic interventions included: empiric antibiotics, systemic steroids, scheduled bronchodilators and supplemental oxygen. We repeated a CT of chest on 10/9, this showed some improvement in the RUL PNA and verified the new airspace disease on the left. Her respiratory status was slow but progressively improved. On 10/9 we notes she still had significant wheeze. We were concerned that she may not be getting adequate effect  from her advair and spiriva. Because of this we placed her on a trial of albuterol and atrovent. Her symptoms and wheezes seemed to do better with this. She was subjectively 80% baseline respiratory status on time of d/c and was eager to go home. Pt sent out on cipro/cleocin  CT chest RUL chronic pna and LUL pna new, improved over time  Now is better, cough is less, yellow to white. No blood.  Pt in left side worse with a deep breath.  No flutter valve. Has an Incentive spirometer.  Past Medical History  Diagnosis Date  . Asthma   . Rheumatoid arthritis(714.0)   . Emphysema     02 dependent 3L/min  . Emphysema   . Pneumonia   . Hypothyroidism   . Diabetes mellitus, type II   . Obesity   . Diastolic heart failure 09/22/2012    Grade 1  . Emphysema      Family History  Problem Relation Age of Onset  . Asthma Mother   . Heart disease Mother   . Clotting disorder Mother   . Colon cancer Neg Hx   . Heart attack Father      History   Social History  . Marital Status: Married    Spouse Name: N/A    Number of Children: 2  . Years of Education: N/A   Occupational History  .     Social History Main Topics  . Smoking status: Former Smoker -- 3.00 packs/day for 43 years    Types: Cigarettes    Quit date: 04/24/2007  . Smokeless tobacco: Never Used     Comment: electronic cigarette daily  .  Alcohol Use: Yes     Comment: occasional  . Drug Use: No  . Sexual Activity: No   Other Topics Concern  . Not on file   Social History Narrative  . No narrative on file     Allergies  Allergen Reactions  . Penicillins Rash  . Sulfonamide Derivatives Rash  . Tetanus Toxoid Other (See Comments)    Knot on arm     Outpatient Prescriptions Prior to Visit  Medication Sig Dispense Refill  . allopurinol (ZYLOPRIM) 100 MG tablet Take 100 mg by mouth daily.      . Ascorbic Acid (VITAMIN C) 1000 MG tablet Take 1,000 mg by mouth daily.      . beclomethasone (QVAR) 80 MCG/ACT inhaler  Inhale 2 puffs into the lungs 2 (two) times daily.  1 Inhaler  12  . cetirizine (ZYRTEC) 10 MG tablet Take 10 mg by mouth daily.      . Cholecalciferol (VITAMIN D-3) 5000 UNITS TABS Take 5,000 Units by mouth daily.      . ciprofloxacin (CIPRO) 500 MG tablet Take 1 tablet (500 mg total) by mouth 2 (two) times daily.  28 tablet  0  . clindamycin (CLEOCIN) 300 MG capsule Take 1 capsule (300 mg total) by mouth 3 (three) times daily.  42 capsule  0  . furosemide (LASIX) 40 MG tablet Take 40 mg by mouth daily.       Marland Kitchen HYDROcodone-acetaminophen (NORCO/VICODIN) 5-325 MG per tablet Take 1 tablet by mouth every 6 (six) hours as needed for pain (pain or cough).  30 tablet  0  . ipratropium (ATROVENT) 0.02 % nebulizer solution Take 2.5 mLs (0.5 mg total) by nebulization 4 (four) times daily.  75 mL  12  . levothyroxine (SYNTHROID, LEVOTHROID) 50 MCG tablet Take 50 mcg by mouth daily before breakfast.      . montelukast (SINGULAIR) 10 MG tablet Take 1 tablet by mouth daily.      . Multiple Vitamin (MULTIVITAMIN) capsule Take 1 capsule by mouth daily.      Marland Kitchen nystatin cream (MYCOSTATIN) Apply 1 application topically daily as needed (under breasts).      . ondansetron (ZOFRAN) 4 MG tablet Take 1 tablet (4 mg total) by mouth every 8 (eight) hours as needed for nausea.  20 tablet  0  . potassium chloride SA (K-DUR,KLOR-CON) 20 MEQ tablet Take 60 mEq by mouth daily.      . predniSONE (DELTASONE) 10 MG tablet Take 4 tabs  daily with food x 4 days, then 3 tabs daily x 4 days, then 2 tabs daily x 4 days, then 1 tab daily x4 days then stop. #40  40 tablet  0  . albuterol (PROVENTIL) (2.5 MG/3ML) 0.083% nebulizer solution Take 3 mLs (2.5 mg total) by nebulization every 4 (four) hours as needed for wheezing or shortness of breath.  75 mL    . fluticasone (FLONASE) 50 MCG/ACT nasal spray Place 1 spray into both nostrils daily as needed for allergies.      Marland Kitchen guaiFENesin (MUCINEX) 600 MG 12 hr tablet Take 1 tablet (600 mg  total) by mouth 2 (two) times daily.  14 tablet  0  . omeprazole (PRILOSEC) 20 MG capsule Take 1 capsule (20 mg total) by mouth daily.  30 capsule  4  . albuterol (PROVENTIL) (5 MG/ML) 0.5% nebulizer solution Take 0.5 mLs (2.5 mg total) by nebulization 4 (four) times daily.  20 mL  12  . CYANOCOBALAMIN PO Take 1 tablet by  mouth every Monday, Wednesday, and Friday.      . docusate sodium 100 MG CAPS Take 100 mg by mouth 2 (two) times daily.  10 capsule  0  . feeding supplement (RESOURCE BREEZE) LIQD Take 1 Container by mouth 3 (three) times daily between meals.  90 Container  0  . folic acid (FOLVITE) 1 MG tablet Take 2 mg by mouth daily.       Marland Kitchen MAGNESIUM-ZINC PO Take 2 tablets by mouth daily.        No facility-administered medications prior to visit.     Review of Systems  Constitutional:   No  weight loss, night sweats,  Fevers, chills,  +fatigue, or  lassitude.  HEENT:   No headaches,  Difficulty swallowing,  Tooth/dental problems, or  Sore throat,                No sneezing, itching, ear ache,  +nasal congestion, post nasal drip,   CV:  No chest pain,  Orthopnea, PND, swelling in lower extremities, anasarca, dizziness, palpitations, syncope.   GI  No heartburn, indigestion, abdominal pain, nausea, vomiting, diarrhea, change in bowel habits, loss of appetite, bloody stools.   Resp:   No wheezing.  No chest wall deformity  Skin: no rash or lesions.  GU: no dysuria, change in color of urine, no urgency or frequency.  No flank pain, no hematuria   MS:    No decreased range of motion.  No back pain.  Psych:  No change in mood or affect. No depression or anxiety.  No memory loss.      Objective:   Physical Exam BP 118/76  Pulse 77  Temp(Src) 98 F (36.7 C)  Ht 5\' 4"  (1.626 m)  Wt 204 lb 12.8 oz (92.897 kg)  BMI 35.14 kg/m2  SpO2 93%  GEN: A/Ox3; pleasant , NAD   HEENT:  [place]/AT,  EACs-clear, TMs-wnl, NOSE-clear, THROAT-clear, no lesions, no postnasal drip or exudate  noted.   NECK:  Supple w/ fair ROM; no JVD; normal carotid impulses w/o bruits; no thyromegaly or nodules palpated; no lymphadenopathy.  RESP: Few rhonchi  w/o, wheezes/ rales/ or rhonchi.no accessory muscle use, no dullness to percussion Consolidative BS RUL  CARD:  RRR, no m/r/g  , 1+  peripheral edema, pulses intact, no cyanosis or clubbing.  GI:   Soft & nt; nml bowel sounds; no organomegaly or masses detected.  Musco: Warm bil   Neuro: alert, no focal deficits noted.    Skin: Warm, no lesions or rashes  Dg Chest 2 View  03/31/2013   CLINICAL DATA:  Followup pneumonia  EXAM: CHEST  2 VIEW  COMPARISON:  03/23/2013  FINDINGS: The cardiac shadow is stable. Persistent bilateral upper lobe changes are noted. No significant interval change is noted. No new focal infiltrate is seen. No acute bony abnormality is noted.  IMPRESSION: Biapical changes stable from the prior exam.   Electronically Signed   By: Alcide Clever M.D.   On: 03/31/2013 10:50       Assessment & Plan:   Recurrent pneumonia Recurrent pneumonia RUL and LUL , Hx of  evident immunosuppression with ARava use.  Now off ARAVA.   Pt with mucus plugging  Neg FOB in the past .  ? Micro aspiration ? Plan Finish cipro and cleocin Finish prednisone Use flutter valve three times daily Stay off ARAVA Stay off arava Flu vaccine was given You should get a shingles vaccine Return 2 months  Updated Medication List Outpatient Encounter Prescriptions as of 03/31/2013  Medication Sig Dispense Refill  . albuterol (PROVENTIL) (2.5 MG/3ML) 0.083% nebulizer solution Take 2.5 mg by nebulization 4 (four) times daily.      Marland Kitchen allopurinol (ZYLOPRIM) 100 MG tablet Take 100 mg by mouth daily.      . Ascorbic Acid (VITAMIN C) 1000 MG tablet Take 1,000 mg by mouth daily.      . beclomethasone (QVAR) 80 MCG/ACT inhaler Inhale 2 puffs into the lungs 2 (two) times daily.  1 Inhaler  12  . cetirizine (ZYRTEC) 10 MG tablet Take 10 mg by  mouth daily.      . Cholecalciferol (VITAMIN D-3) 5000 UNITS TABS Take 5,000 Units by mouth daily.      . ciprofloxacin (CIPRO) 500 MG tablet Take 1 tablet (500 mg total) by mouth 2 (two) times daily.  28 tablet  0  . clindamycin (CLEOCIN) 300 MG capsule Take 1 capsule (300 mg total) by mouth 3 (three) times daily.  42 capsule  0  . furosemide (LASIX) 40 MG tablet Take 40 mg by mouth daily.       Marland Kitchen HYDROcodone-acetaminophen (NORCO/VICODIN) 5-325 MG per tablet Take 1 tablet by mouth every 6 (six) hours as needed for pain (pain or cough).  30 tablet  0  . ipratropium (ATROVENT) 0.02 % nebulizer solution Take 2.5 mLs (0.5 mg total) by nebulization 4 (four) times daily.  75 mL  12  . levothyroxine (SYNTHROID, LEVOTHROID) 50 MCG tablet Take 50 mcg by mouth daily before breakfast.      . montelukast (SINGULAIR) 10 MG tablet Take 1 tablet by mouth daily.      . Multiple Vitamin (MULTIVITAMIN) capsule Take 1 capsule by mouth daily.      Marland Kitchen nystatin cream (MYCOSTATIN) Apply 1 application topically daily as needed (under breasts).      . ondansetron (ZOFRAN) 4 MG tablet Take 1 tablet (4 mg total) by mouth every 8 (eight) hours as needed for nausea.  20 tablet  0  . potassium chloride SA (K-DUR,KLOR-CON) 20 MEQ tablet Take 60 mEq by mouth daily.      . predniSONE (DELTASONE) 10 MG tablet Take 4 tabs  daily with food x 4 days, then 3 tabs daily x 4 days, then 2 tabs daily x 4 days, then 1 tab daily x4 days then stop. #40  40 tablet  0  . [DISCONTINUED] albuterol (PROVENTIL) (2.5 MG/3ML) 0.083% nebulizer solution Take 3 mLs (2.5 mg total) by nebulization every 4 (four) hours as needed for wheezing or shortness of breath.  75 mL    . fluconazole (DIFLUCAN) 150 MG tablet Use as directed      . fluticasone (FLONASE) 50 MCG/ACT nasal spray Place 1 spray into both nostrils daily as needed for allergies.      Marland Kitchen guaiFENesin (MUCINEX) 600 MG 12 hr tablet Take 1 tablet (600 mg total) by mouth 2 (two) times daily.  14  tablet  0  . omeprazole (PRILOSEC) 20 MG capsule Take 1 capsule (20 mg total) by mouth daily.  30 capsule  4  . Respiratory Therapy Supplies (FLUTTER) DEVI Use three times daily  1 each  0  . [DISCONTINUED] albuterol (PROVENTIL) (5 MG/ML) 0.5% nebulizer solution Take 0.5 mLs (2.5 mg total) by nebulization 4 (four) times daily.  20 mL  12  . [DISCONTINUED] CYANOCOBALAMIN PO Take 1 tablet by mouth every Monday, Wednesday, and Friday.      . [DISCONTINUED] docusate sodium  100 MG CAPS Take 100 mg by mouth 2 (two) times daily.  10 capsule  0  . [DISCONTINUED] feeding supplement (RESOURCE BREEZE) LIQD Take 1 Container by mouth 3 (three) times daily between meals.  90 Container  0  . [DISCONTINUED] folic acid (FOLVITE) 1 MG tablet Take 2 mg by mouth daily.       . [DISCONTINUED] MAGNESIUM-ZINC PO Take 2 tablets by mouth daily.        No facility-administered encounter medications on file as of 03/31/2013.

## 2013-04-01 NOTE — Progress Notes (Signed)
Quick Note:  Called, spoke with pt. Informed her of lab results and recs per Dr. Wright. She verbalized understanding and voiced no further questions or concerns at this time. ______ 

## 2013-04-01 NOTE — Assessment & Plan Note (Signed)
Recurrent pneumonia RUL and LUL , Hx of  evident immunosuppression with ARava use.  Now off ARAVA.   Pt with mucus plugging  Neg FOB in the past .  ? Micro aspiration ? Plan Finish cipro and cleocin Finish prednisone Use flutter valve three times daily Stay off ARAVA Stay off arava Flu vaccine was given You should get a shingles vaccine Return 2 months

## 2013-04-11 ENCOUNTER — Telehealth: Payer: Self-pay | Admitting: Critical Care Medicine

## 2013-04-11 MED ORDER — IPRATROPIUM BROMIDE 0.02 % IN SOLN: 0.5000 mg | Freq: Four times a day (QID) | RESPIRATORY_TRACT | Status: DC

## 2013-04-11 NOTE — Telephone Encounter (Signed)
I spoke with pt. RX for the ipratropium was not sent in enough for a month supply. I advised pt will do so. RX has been sent. Nothing further needed

## 2013-04-12 ENCOUNTER — Ambulatory Visit: Payer: Medicare Other | Admitting: Critical Care Medicine

## 2013-04-14 ENCOUNTER — Telehealth: Payer: Self-pay | Admitting: Critical Care Medicine

## 2013-04-14 ENCOUNTER — Inpatient Hospital Stay (HOSPITAL_COMMUNITY): Payer: Medicare Other

## 2013-04-14 ENCOUNTER — Ambulatory Visit (INDEPENDENT_AMBULATORY_CARE_PROVIDER_SITE_OTHER): Payer: Medicare Other | Admitting: Adult Health

## 2013-04-14 ENCOUNTER — Encounter: Payer: Self-pay | Admitting: Adult Health

## 2013-04-14 ENCOUNTER — Encounter (HOSPITAL_COMMUNITY): Payer: Self-pay

## 2013-04-14 ENCOUNTER — Inpatient Hospital Stay (HOSPITAL_COMMUNITY)
Admission: AD | Admit: 2013-04-14 | Discharge: 2013-04-19 | DRG: 196 | Disposition: A | Discharge status: Home Health | Payer: Medicare Other | Source: Ambulatory Visit | Attending: Critical Care Medicine | Admitting: Critical Care Medicine

## 2013-04-14 VITALS — BP 94/62 | HR 124 | Temp 100.3°F | Ht 64.0 in | Wt 207.0 lb

## 2013-04-14 DIAGNOSIS — T380X5A Adverse effect of glucocorticoids and synthetic analogues, initial encounter: Secondary | ICD-10-CM | POA: Diagnosis present

## 2013-04-14 DIAGNOSIS — J449 Chronic obstructive pulmonary disease, unspecified: Secondary | ICD-10-CM

## 2013-04-14 DIAGNOSIS — J189 Pneumonia, unspecified organism: Secondary | ICD-10-CM

## 2013-04-14 DIAGNOSIS — I509 Heart failure, unspecified: Secondary | ICD-10-CM | POA: Diagnosis present

## 2013-04-14 DIAGNOSIS — Z880 Allergy status to penicillin: Secondary | ICD-10-CM

## 2013-04-14 DIAGNOSIS — J9610 Chronic respiratory failure, unspecified whether with hypoxia or hypercapnia: Secondary | ICD-10-CM

## 2013-04-14 DIAGNOSIS — K219 Gastro-esophageal reflux disease without esophagitis: Secondary | ICD-10-CM | POA: Diagnosis present

## 2013-04-14 DIAGNOSIS — M069 Rheumatoid arthritis, unspecified: Secondary | ICD-10-CM | POA: Diagnosis present

## 2013-04-14 DIAGNOSIS — Z9981 Dependence on supplemental oxygen: Secondary | ICD-10-CM

## 2013-04-14 DIAGNOSIS — I5033 Acute on chronic diastolic (congestive) heart failure: Secondary | ICD-10-CM | POA: Diagnosis present

## 2013-04-14 DIAGNOSIS — Z6836 Body mass index (BMI) 36.0-36.9, adult: Secondary | ICD-10-CM

## 2013-04-14 DIAGNOSIS — Z8701 Personal history of pneumonia (recurrent): Secondary | ICD-10-CM

## 2013-04-14 DIAGNOSIS — J9620 Acute and chronic respiratory failure, unspecified whether with hypoxia or hypercapnia: Secondary | ICD-10-CM | POA: Diagnosis present

## 2013-04-14 DIAGNOSIS — J479 Bronchiectasis, uncomplicated: Secondary | ICD-10-CM | POA: Diagnosis present

## 2013-04-14 DIAGNOSIS — R600 Localized edema: Secondary | ICD-10-CM

## 2013-04-14 DIAGNOSIS — B9789 Other viral agents as the cause of diseases classified elsewhere: Secondary | ICD-10-CM | POA: Diagnosis present

## 2013-04-14 DIAGNOSIS — Z79899 Other long term (current) drug therapy: Secondary | ICD-10-CM

## 2013-04-14 DIAGNOSIS — J4489 Other specified chronic obstructive pulmonary disease: Secondary | ICD-10-CM | POA: Diagnosis present

## 2013-04-14 DIAGNOSIS — R5381 Other malaise: Secondary | ICD-10-CM | POA: Diagnosis present

## 2013-04-14 DIAGNOSIS — N898 Other specified noninflammatory disorders of vagina: Secondary | ICD-10-CM | POA: Diagnosis present

## 2013-04-14 DIAGNOSIS — M0510 Rheumatoid lung disease with rheumatoid arthritis of unspecified site: Principal | ICD-10-CM | POA: Diagnosis present

## 2013-04-14 DIAGNOSIS — E43 Unspecified severe protein-calorie malnutrition: Secondary | ICD-10-CM | POA: Diagnosis present

## 2013-04-14 DIAGNOSIS — E119 Type 2 diabetes mellitus without complications: Secondary | ICD-10-CM | POA: Diagnosis present

## 2013-04-14 DIAGNOSIS — R918 Other nonspecific abnormal finding of lung field: Secondary | ICD-10-CM

## 2013-04-14 DIAGNOSIS — E1129 Type 2 diabetes mellitus with other diabetic kidney complication: Secondary | ICD-10-CM | POA: Diagnosis present

## 2013-04-14 DIAGNOSIS — I5030 Unspecified diastolic (congestive) heart failure: Secondary | ICD-10-CM | POA: Diagnosis present

## 2013-04-14 DIAGNOSIS — E039 Hypothyroidism, unspecified: Secondary | ICD-10-CM | POA: Diagnosis present

## 2013-04-14 DIAGNOSIS — I5032 Chronic diastolic (congestive) heart failure: Secondary | ICD-10-CM | POA: Diagnosis present

## 2013-04-14 LAB — COMPREHENSIVE METABOLIC PANEL
ALT: 13 U/L (ref 0–35)
AST: 20 U/L (ref 0–37)
Albumin: 2 g/dL — ABNORMAL LOW (ref 3.5–5.2)
Alkaline Phosphatase: 76 U/L (ref 39–117)
CO2: 25 mEq/L (ref 19–32)
Chloride: 96 mEq/L (ref 96–112)
GFR calc non Af Amer: 75 mL/min — ABNORMAL LOW (ref >90)
Sodium: 132 mEq/L — ABNORMAL LOW (ref 135–145)
Total Bilirubin: 0.5 mg/dL (ref 0.3–1.2)

## 2013-04-14 LAB — URINALYSIS, ROUTINE W REFLEX MICROSCOPIC
Bilirubin Urine: NEGATIVE
Glucose, UA: NEGATIVE mg/dL
Hgb urine dipstick: NEGATIVE
Leukocytes, UA: NEGATIVE
Nitrite: NEGATIVE
Specific Gravity, Urine: 1.006 (ref 1.005–1.030)
pH: 7.5 (ref 5.0–8.0)

## 2013-04-14 LAB — CBC
HCT: 31.6 % — ABNORMAL LOW (ref 36.0–46.0)
Hemoglobin: 9.9 g/dL — ABNORMAL LOW (ref 12.0–15.0)
MCH: 25.6 pg — ABNORMAL LOW (ref 26.0–34.0)
MCV: 81.7 fL (ref 78.0–100.0)
Platelets: 219 10*3/uL (ref 150–400)
RBC: 3.87 MIL/uL (ref 3.87–5.11)
WBC: 8.1 10*3/uL (ref 4.0–10.5)

## 2013-04-14 LAB — LACTIC ACID, PLASMA: Lactic Acid, Venous: 1 mmol/L (ref 0.5–2.2)

## 2013-04-14 LAB — EXPECTORATED SPUTUM ASSESSMENT W GRAM STAIN, RFLX TO RESP C

## 2013-04-14 LAB — PRO B NATRIURETIC PEPTIDE: Pro B Natriuretic peptide (BNP): 301.6 pg/mL — ABNORMAL HIGH (ref 0–125)

## 2013-04-14 LAB — PROTIME-INR: INR: 1.17 (ref 0.00–1.49)

## 2013-04-14 LAB — APTT: aPTT: 35 seconds (ref 24–37)

## 2013-04-14 MED ORDER — PANTOPRAZOLE SODIUM 40 MG IV SOLR
40.0000 mg | Freq: Every day | INTRAVENOUS | Status: DC
  Administered 2013-04-14 (×2): 40 mg via INTRAVENOUS
  Filled 2013-04-14 (×3): qty 40

## 2013-04-14 MED ORDER — SODIUM CHLORIDE 0.9 % IV SOLN
500.0000 mg | Freq: Four times a day (QID) | INTRAVENOUS | Status: DC
  Administered 2013-04-14 – 2013-04-19 (×19): 500 mg via INTRAVENOUS
  Filled 2013-04-14 (×24): qty 500

## 2013-04-14 MED ORDER — VANCOMYCIN HCL IN DEXTROSE 1-5 GM/200ML-% IV SOLN
1000.0000 mg | Freq: Three times a day (TID) | INTRAVENOUS | Status: DC
  Administered 2013-04-14: 1000 mg via INTRAVENOUS
  Filled 2013-04-14 (×2): qty 200

## 2013-04-14 MED ORDER — SODIUM CHLORIDE 0.9 % IV BOLUS (SEPSIS)
500.0000 mL | Freq: Once | INTRAVENOUS | Status: AC
  Administered 2013-04-14: 500 mL via INTRAVENOUS

## 2013-04-14 MED ORDER — IPRATROPIUM BROMIDE 0.02 % IN SOLN
0.5000 mg | Freq: Four times a day (QID) | RESPIRATORY_TRACT | Status: DC
  Administered 2013-04-14 – 2013-04-18 (×13): 0.5 mg via RESPIRATORY_TRACT
  Filled 2013-04-14 (×15): qty 2.5

## 2013-04-14 MED ORDER — HEPARIN SODIUM (PORCINE) 5000 UNIT/ML IJ SOLN
5000.0000 [IU] | Freq: Three times a day (TID) | INTRAMUSCULAR | Status: DC
  Administered 2013-04-14 – 2013-04-18 (×10): 5000 [IU] via SUBCUTANEOUS
  Filled 2013-04-14 (×15): qty 1

## 2013-04-14 MED ORDER — VANCOMYCIN HCL 10 G IV SOLR
1250.0000 mg | Freq: Two times a day (BID) | INTRAVENOUS | Status: DC
  Administered 2013-04-15 – 2013-04-16 (×3): 1250 mg via INTRAVENOUS
  Filled 2013-04-14 (×5): qty 1250

## 2013-04-14 MED ORDER — OSELTAMIVIR PHOSPHATE 75 MG PO CAPS
75.0000 mg | ORAL_CAPSULE | Freq: Two times a day (BID) | ORAL | Status: DC
  Administered 2013-04-14 – 2013-04-16 (×4): 75 mg via ORAL
  Filled 2013-04-14 (×6): qty 1

## 2013-04-14 MED ORDER — ONDANSETRON HCL 4 MG/2ML IJ SOLN
4.0000 mg | Freq: Four times a day (QID) | INTRAMUSCULAR | Status: DC | PRN
  Administered 2013-04-14: 4 mg via INTRAVENOUS
  Filled 2013-04-14: qty 2

## 2013-04-14 MED ORDER — ONDANSETRON HCL 4 MG/2ML IJ SOLN: 4.0000 mg | Freq: Four times a day (QID) | INTRAMUSCULAR | Status: DC

## 2013-04-14 MED ORDER — ALBUTEROL SULFATE (5 MG/ML) 0.5% IN NEBU
2.5000 mg | INHALATION_SOLUTION | Freq: Four times a day (QID) | RESPIRATORY_TRACT | Status: DC
  Administered 2013-04-14 – 2013-04-18 (×13): 2.5 mg via RESPIRATORY_TRACT
  Filled 2013-04-14 (×15): qty 0.5

## 2013-04-14 MED ORDER — SODIUM CHLORIDE 0.9 % IV SOLN
INTRAVENOUS | Status: DC
  Administered 2013-04-14: 14:00:00 via INTRAVENOUS
  Administered 2013-04-15: 50 mL/h via INTRAVENOUS
  Administered 2013-04-16 – 2013-04-18 (×4): via INTRAVENOUS

## 2013-04-14 MED ORDER — METHYLPREDNISOLONE SODIUM SUCC 125 MG IJ SOLR
60.0000 mg | Freq: Three times a day (TID) | INTRAMUSCULAR | Status: DC
  Administered 2013-04-14 – 2013-04-15 (×3): 60 mg via INTRAVENOUS
  Filled 2013-04-14 (×6): qty 0.96

## 2013-04-14 MED ORDER — VANCOMYCIN HCL IN DEXTROSE 1-5 GM/200ML-% IV SOLN
1000.0000 mg | Freq: Once | INTRAVENOUS | Status: AC
  Administered 2013-04-14: 1000 mg via INTRAVENOUS
  Filled 2013-04-14: qty 200

## 2013-04-14 NOTE — Telephone Encounter (Signed)
Spoke with daughter Lelon Mast) in regards to pt health.  Pt admitted to [facility] for ? Recurent PNA and Aspiration w/ n/v Seen in office with TP this morning. Daughter asking that I let Dr Delford Field know what is going on with her Mother--in hopes that Dr Delford Field will be able to follow-up on patient while in hospital. Explained to her that Tammy Parrett would communicate with Dr Delford Field on the status of the pt and today's visit. Daughter upset and crying while on phone--not sure what all is going on with pt, Step-Father brought patient into our office today Lelon Mast unable to make appt).   FYI sent to Dr Delford Field on status of patient.

## 2013-04-14 NOTE — Progress Notes (Signed)
Subjective:    Patient ID: Jacqueline Washington, female    DOB: January 10, 1951, 62 y.o.   MRN: [id number]  HPI  03/31/2013 Chief Complaint  Patient presents with  . Post Hospital Follow up    Was d/c's from [facility] on 10/10. Breathing has improved since d/c but not back to baseline.  Has left side pain when inhaling.  Prod cough with white to yellow mucus.    Pt in hosp again 03/2013: Detailed Hospital Course:  62 y/o immunocompromised (Leflunomide for RA) with emphysema (Dr. Lynelle Doctor office patient) and recurrent pneumonia admitted with increased dyspnea, subjective fevers and new pulmonary infiltrates. Patient reported having symptoms for one week. She developed progressive worsening of dyspnea as well as her cough became productive for yellow sputum. She had subjective fever but she did not check her temperature. She had increased oxygen requirements since several weeks ago. She was treated with antibiotics and systemic steroids without significant improvement. She reports having an acute admission to the hospital in May of 2014 for pneumonia and spontaneous pneumothorax. She also reports having pneumonia in January of 2014 for which she did not seek medical attention. Overall she describes deterioration of her respiratory status since about 2 years ago. She denies any chest pain or palpitations.She was found to have new left upper lobe airspace disease and still chronic RUL PNA. She was admitted for PNA in immunosuppressed host.  She was admitted initially to the step down unit setting. Therapeutic interventions included: empiric antibiotics, systemic steroids, scheduled bronchodilators and supplemental oxygen. We repeated a CT of chest on 10/9, this showed some improvement in the RUL PNA and verified the new airspace disease on the left. Her respiratory status was slow but progressively improved. On 10/9 we notes she still had significant wheeze. We were concerned that she may not be getting adequate effect  from her advair and spiriva. Because of this we placed her on a trial of albuterol and atrovent. Her symptoms and wheezes seemed to do better with this. She was subjectively 80% baseline respiratory status on time of d/c and was eager to go home. Pt sent out on cipro/cleocin  CT chest RUL chronic pna and LUL pna new, improved over time  Now is better, cough is less, yellow to white. No blood.  Pt in left side worse with a deep breath.  No flutter valve. Has an Incentive spirometer.   04/14/2013 Acute OV  Complains of increased SOB, soreness in chest, increased fatigue/sleepiness, prod cough with green/white mucus x4-5days. Pt recently admitted for AECOPD , recurrent PNA .Tx w/ prolonged abx with cipro and cleocin. Says started having worseing breathing for 5 days with increased cough , congesiton with thick green mucus. Vomited this am. Fevers over last 3 days. In office, HR ~125-145 , vomited several times, b/p 94/62. Increased WOB. Sats 86-90% on 2 L. Suppose to be on 3 l/m . Sats improved on 3 l/m at 92%.  Says she has been sleeping a lot over last few days. No energy .  Pt recommended for admission with EMS transport. Pt refused adamantly EMS transport.  She has a hx of RA on Arava- Arava is on hold due to recent PNA. Wt is up 3 lbs over last 2 weeks. Sleeps in recliner every night.     Past Medical History  Diagnosis Date  . Asthma   . Rheumatoid arthritis(714.0)   . Emphysema     02 dependent 3L/min  . Emphysema   . Pneumonia   .  Hypothyroidism   . Diabetes mellitus, type II   . Obesity   . Diastolic heart failure 09/22/2012    Grade 1  . Emphysema      Family History  Problem Relation Age of Onset  . Asthma Mother   . Heart disease Mother   . Clotting disorder Mother   . Colon cancer Neg Hx   . Heart attack Father      History   Social History  . Marital Status: Married    Spouse Name: N/A    Number of Children: 2  . Years of Education: N/A   Occupational History   .     Social History Main Topics  . Smoking status: Former Smoker -- 3.00 packs/day for 43 years    Types: Cigarettes    Quit date: 04/24/2007  . Smokeless tobacco: Never Used     Comment: electronic cigarette daily  . Alcohol Use: Yes     Comment: occasional  . Drug Use: No  . Sexual Activity: No   Other Topics Concern  . Not on file   Social History Narrative  . No narrative on file     Allergies  Allergen Reactions  . Penicillins Rash  . Sulfonamide Derivatives Rash  . Tetanus Toxoid Other (See Comments)    Knot on arm     Outpatient Prescriptions Prior to Visit  Medication Sig Dispense Refill  . albuterol (PROVENTIL) (2.5 MG/3ML) 0.083% nebulizer solution Take 2.5 mg by nebulization 4 (four) times daily.      Marland Kitchen allopurinol (ZYLOPRIM) 100 MG tablet Take 100 mg by mouth daily.      . Ascorbic Acid (VITAMIN C) 1000 MG tablet Take 1,000 mg by mouth daily.      . beclomethasone (QVAR) 80 MCG/ACT inhaler Inhale 2 puffs into the lungs 2 (two) times daily.  1 Inhaler  12  . cetirizine (ZYRTEC) 10 MG tablet Take 10 mg by mouth daily.      . Cholecalciferol (VITAMIN D-3) 5000 UNITS TABS Take 5,000 Units by mouth daily.      . fluticasone (FLONASE) 50 MCG/ACT nasal spray Place 1 spray into both nostrils daily as needed for allergies.      . furosemide (LASIX) 40 MG tablet Take 40 mg by mouth daily.       Marland Kitchen guaiFENesin (MUCINEX) 600 MG 12 hr tablet Take 1 tablet (600 mg total) by mouth 2 (two) times daily.  14 tablet  0  . HYDROcodone-acetaminophen (NORCO/VICODIN) 5-325 MG per tablet Take 1 tablet by mouth every 6 (six) hours as needed for pain (pain or cough).  30 tablet  0  . ipratropium (ATROVENT) 0.02 % nebulizer solution Take 2.5 mLs (0.5 mg total) by nebulization 4 (four) times daily. Dx 496  300 mL  12  . levothyroxine (SYNTHROID, LEVOTHROID) 50 MCG tablet Take 50 mcg by mouth daily before breakfast.      . montelukast (SINGULAIR) 10 MG tablet Take 1 tablet by mouth daily.       . Multiple Vitamin (MULTIVITAMIN) capsule Take 1 capsule by mouth daily.      Marland Kitchen nystatin cream (MYCOSTATIN) Apply 1 application topically daily as needed (under breasts).      Marland Kitchen omeprazole (PRILOSEC) 20 MG capsule Take 1 capsule (20 mg total) by mouth daily.  30 capsule  4  . ondansetron (ZOFRAN) 4 MG tablet Take 1 tablet (4 mg total) by mouth every 8 (eight) hours as needed for nausea.  20 tablet  0  .  potassium chloride SA (K-DUR,KLOR-CON) 20 MEQ tablet Take 60 mEq by mouth daily.      Marland Kitchen Respiratory Therapy Supplies (FLUTTER) DEVI Use three times daily  1 each  0  . ciprofloxacin (CIPRO) 500 MG tablet Take 1 tablet (500 mg total) by mouth 2 (two) times daily.  28 tablet  0  . clindamycin (CLEOCIN) 300 MG capsule Take 1 capsule (300 mg total) by mouth 3 (three) times daily.  42 capsule  0  . fluconazole (DIFLUCAN) 150 MG tablet Use as directed      . predniSONE (DELTASONE) 10 MG tablet Take 4 tabs  daily with food x 4 days, then 3 tabs daily x 4 days, then 2 tabs daily x 4 days, then 1 tab daily x4 days then stop. #40  40 tablet  0   No facility-administered medications prior to visit.     Review of Systems  Constitutional:   No  weight loss, night sweats,   +++Fevers, chills,  +fatigue, or  lassitude.  HEENT:   No headaches,  Difficulty swallowing,  Tooth/dental problems, or  Sore throat,                No sneezing, itching, ear ache,  +nasal congestion, post nasal drip,   CV:  No chest pain,  + Orthopnea, PND,  +swelling in lower extremities,  NO anasarca, dizziness, palpitations, syncope.   GI  No heartburn, indigestion, abdominal pain, nausea, vomiting, diarrhea, change in bowel habits, loss of appetite, bloody stools.   Resp:   No wheezing.  No chest wall deformity  Skin: no rash or lesions.  GU: no dysuria, change in color of urine, no urgency or frequency.  No flank pain, no hematuria   MS:    No decreased range of motion.  No back pain.  Psych:  No change in mood  or affect. No depression or anxiety.  No memory loss.      Objective:   Physical Exam BP 94/62  Pulse 124  Temp(Src) 100.3 F (37.9 C) (Oral)  Ht 5\' 4"  (1.626 m)  Wt 207 lb (93.895 kg)  BMI 35.51 kg/m2  SpO2 93%  GEN: A/Ox3; morbidly obese  HEENT:  [place]/AT, , TMs-wnl, NOSE-clear, THROAT-clear, no lesions, no postnasal drip or exudate noted.   NECK:  Supple w/ fair ROM; no JVD; normal carotid impulses w/o bruits; no thyromegaly or nodules palpated; no lymphadenopathy.  RESP: scattered rhonchi , few wheezes .no accessory muscle use, no dullness to percussion, increased WOB    CARD: ST , no m/r/g  , tr-1+  peripheral edema, pulses intact, no cyanosis or clubbing.  GI:   Soft & nt; nml bowel sounds; no organomegaly or masses detected.  Musco: Warm bil   Neuro: alert, no focal deficits noted.    Skin: Warm, no lesions or rashes  No results found.     Assessment & Plan:   No problem-specific assessment & plan notes found for this encounter.   Updated Medication List Outpatient Encounter Prescriptions as of 04/14/2013  Medication Sig Dispense Refill  . albuterol (PROVENTIL) (2.5 MG/3ML) 0.083% nebulizer solution Take 2.5 mg by nebulization 4 (four) times daily.      Marland Kitchen allopurinol (ZYLOPRIM) 100 MG tablet Take 100 mg by mouth daily.      . Ascorbic Acid (VITAMIN C) 1000 MG tablet Take 1,000 mg by mouth daily.      . beclomethasone (QVAR) 80 MCG/ACT inhaler Inhale 2 puffs into the lungs 2 (two) times daily.  1 Inhaler  12  . cetirizine (ZYRTEC) 10 MG tablet Take 10 mg by mouth daily.      . Cholecalciferol (VITAMIN D-3) 5000 UNITS TABS Take 5,000 Units by mouth daily.      . fluticasone (FLONASE) 50 MCG/ACT nasal spray Place 1 spray into both nostrils daily as needed for allergies.      . furosemide (LASIX) 40 MG tablet Take 40 mg by mouth daily.       Marland Kitchen guaiFENesin (MUCINEX) 600 MG 12 hr tablet Take 1 tablet (600 mg total) by mouth 2 (two) times daily.  14 tablet  0  .  HYDROcodone-acetaminophen (NORCO/VICODIN) 5-325 MG per tablet Take 1 tablet by mouth every 6 (six) hours as needed for pain (pain or cough).  30 tablet  0  . ipratropium (ATROVENT) 0.02 % nebulizer solution Take 2.5 mLs (0.5 mg total) by nebulization 4 (four) times daily. Dx 496  300 mL  12  . levothyroxine (SYNTHROID, LEVOTHROID) 50 MCG tablet Take 50 mcg by mouth daily before breakfast.      . montelukast (SINGULAIR) 10 MG tablet Take 1 tablet by mouth daily.      . Multiple Vitamin (MULTIVITAMIN) capsule Take 1 capsule by mouth daily.      Marland Kitchen nystatin cream (MYCOSTATIN) Apply 1 application topically daily as needed (under breasts).      Marland Kitchen omeprazole (PRILOSEC) 20 MG capsule Take 1 capsule (20 mg total) by mouth daily.  30 capsule  4  . ondansetron (ZOFRAN) 4 MG tablet Take 1 tablet (4 mg total) by mouth every 8 (eight) hours as needed for nausea.  20 tablet  0  . potassium chloride SA (K-DUR,KLOR-CON) 20 MEQ tablet Take 60 mEq by mouth daily.      Marland Kitchen Respiratory Therapy Supplies (FLUTTER) DEVI Use three times daily  1 each  0  . [DISCONTINUED] ciprofloxacin (CIPRO) 500 MG tablet Take 1 tablet (500 mg total) by mouth 2 (two) times daily.  28 tablet  0  . [DISCONTINUED] clindamycin (CLEOCIN) 300 MG capsule Take 1 capsule (300 mg total) by mouth 3 (three) times daily.  42 capsule  0  . [DISCONTINUED] fluconazole (DIFLUCAN) 150 MG tablet Use as directed      . [DISCONTINUED] predniSONE (DELTASONE) 10 MG tablet Take 4 tabs  daily with food x 4 days, then 3 tabs daily x 4 days, then 2 tabs daily x 4 days, then 1 tab daily x4 days then stop. #40  40 tablet  0   No facility-administered encounter medications on file as of 04/14/2013.

## 2013-04-14 NOTE — Progress Notes (Signed)
[facility] physician notified that PICC will not be inserted until tomorrow.

## 2013-04-14 NOTE — Telephone Encounter (Signed)
Spoke with Daughter--Samantha Aware that PW out of office until next Wed 04/20/13 (out of town) Aware that PW partners will be handling pt care while he is out.   Advised daughter that she can contact [facility] and speak with Nurse about pts current condition and for further information.

## 2013-04-14 NOTE — Progress Notes (Addendum)
ANTIBIOTIC CONSULT NOTE - INITIAL  Pharmacy Consult for Vancomycin, Primaxin Indication: rule out pneumonia  Allergies  Allergen Reactions  . Penicillins Rash  . Sulfonamide Derivatives Rash  . Tetanus Toxoid Other (See Comments)    Knot on arm    Patient Measurements: Height: 5\' 4"  (162.6 cm) Weight: 208 lb 1.8 oz (94.4 kg) IBW/kg (Calculated) : 54.7  Vital Signs: Temp: 100.4 F (38 C) (10/30 1100) Temp src: Oral (10/30 1100) BP: 116/62 mmHg (10/30 1100) Pulse Rate: 108 (10/30 1100)  Labs: No results found for this basename: WBC, HGB, PLT, LABCREA, CREATININE,  in the last 72 hours Estimated Creatinine Clearance: 81.3 ml/min (by C-G formula based on Cr of 0.8).  Microbiology:  Medical History: Past Medical History  Diagnosis Date  . Asthma   . Rheumatoid arthritis(714.0)   . Emphysema     02 dependent 3L/min  . Emphysema   . Pneumonia   . Hypothyroidism   . Diabetes mellitus, type II   . Obesity   . Diastolic heart failure 09/22/2012    Grade 1  . Emphysema     Medications:  Anti-infectives   Start     Dose/Rate Route Frequency Ordered Stop   04/14/13 1230  oseltamivir (TAMIFLU) capsule 75 mg     75 mg Oral 2 times daily 04/14/13 1151 04/19/13 0959     Assessment: 62 yo F admitted 10/30 with possible recurrent pneumonia.  She presented to outpatient MD office with c/o vomiting, cough, SOB, and fever after recent hospitalization at [facility] for pneumonia from 10/7 - 10/10; IV antibiotics used while inpatient, but discharged on cipro/clinda PO.  Now, concerned for recurrent pneumonia and/or viral process.  Pharmacy is consulted to dose vancomycin and Primaxin.  Previously noted as immunocompromised d/t leflunomide use for RA - med rec is pending, but not currently listed as an active medication.  Tmax: 100.4  Labs remain pending collection.  RN reports that phlebotomy was not able to draw blood and PICC line has not been placed yet.  Plan is to get PICC placed by  IR as soon as possible.  Cultures pending.  First doses of vancomycin and Primaxin dosed based on weight 94 kg, SCr 0.8 (03/31/13) for CrCl ~ 81 ml/min.  Goal of Therapy:  Vancomycin trough level 15-20 mcg/ml Appropriate abx dosing, eradication of infection.   Plan:   Primaxin 500mg  IV x1 dose  Vancomycin 1g IV x1 dose  Follow up further vancomycin and Primaxin dosing when labs become available.  Measure Vanc trough at steady state.  Follow up renal fxn and culture results.   Lynann Beaver PharmD, BCPS Pager [phone] 04/14/2013 12:10 PM     Addendum: 04/14/13 BMET    Component Value Date/Time   NA 132* 04/14/2013 1420   K 4.0 04/14/2013 1420   CL 96 04/14/2013 1420   CO2 25 04/14/2013 1420   GLUCOSE 87 04/14/2013 1420   BUN 10 04/14/2013 1420   CREATININE 0.82 04/14/2013 1420   CALCIUM 8.8 04/14/2013 1420   GFRNONAA 75* 04/14/2013 1420   GFRAA 87* 04/14/2013 1420  CrCl ~ 79 ml/min  Plan:   Primaxin 500mg  IV q6h.  Vancomycin 1g IV x1 dose + Vancomycin 1g IV x1 dose for total loading dose of 2g (21 mg/kg).   Vancomycin 1250mg  IV q12h.  Measure Vanc trough at steady state.  Follow up renal fxn and culture results.  Lynann Beaver PharmD, BCPS Pager [phone] 04/14/2013 3:17 PM

## 2013-04-14 NOTE — Progress Notes (Signed)
Assessed patient for PICC line placement by ultrasound.  No sites noted on right, one site noted on left but is too small with catheter occupying >50%.  Bedside RN updated as well as Devra Dopp, NP.  I was able to place PIV with ultrasound in forearm.  Will need to go to radiology if PICC line is still desired.  Thank you.  Mikah Poss, Lajean Manes, RN IV team.

## 2013-04-14 NOTE — H&P (Signed)
PULMONARY  / CRITICAL CARE MEDICINE  Name: Jacqueline Washington MRN: [id number] DOB: [birth year]    ADMISSION DATE:  04/14/2013    PRIMARY SERVICE:PCM  CHIEF COMPLAINT:  Vomiting/cough/SOB  BRIEF PATIENT DESCRIPTION:  62 yo wf with a history of rheumatoid arthritis, recent pneumonia's x 3, suspected aspiration, who was just discharged from hospital 03-25-13 with recurrent pneumonia. She completed cipro/.clinda  10/24 and prednisone 10/28.  She presents 10-30 with chills, white sputum productive, fever 100.5 and vomiting to the [place] office. She was evaluated and sent to [facility] SDU for further evaluation and treatment. We cannot rule out recurrent pneumonia and/or a viral process therefore we will treat both till further testing allow narrowing of treatment. She may need IV abx at home therefore a PICC will be placed. SIGNIFICANT EVENTS / STUDIES:  10-30 readmitted  LINES / TUBES: 10-30 PICC ordered>>  CULTURES: 10-30 bc x 2>> 10-30 sputum>> 10-30 uc>> 10-30 flu panel>>  ANTIBIOTICS/Antivirals: 10-30 vanc>> 10-30 primaxin>> 10-30 tamiflu>>  HISTORY OF PRESENT ILLNESS:   62 yo wf with a history of rheumatoid arthritis, recent pneumonia's x 3, suspected aspiration, who was just discharged from hospital 03-25-13 with recurrent pneumonia. She completed cipro/.clinda  10/24 and prednisone 10/28.  She presents 10-30 with chills, white sputum productive, fever 100.5 and vomiting to the [place] office. She was evaluated and sent to [facility] SDU for further evaluation and treatment. We cannot rule out recurrent pneumonia and/or a viral process therefore we will treat both till further testing allow narrowing of treatment. She may need IV abx at home therefore a PICC will be placed.  PAST MEDICAL HISTORY :  Past Medical History  Diagnosis Date  . Asthma   . Rheumatoid arthritis(714.0)   . Emphysema     02 dependent 3L/min  . Emphysema   . Pneumonia   . Hypothyroidism   . Diabetes mellitus, type  II   . Obesity   . Diastolic heart failure 09/22/2012    Grade 1  . Emphysema    Past Surgical History  Procedure Laterality Date  . Eye surgery  at age 66  . Tubal ligation    . Colonoscopy    . Video bronchoscopy N/A 11/18/2012    Procedure: VIDEO BRONCHOSCOPY WITH FLUORO;  Surgeon: Storm Frisk, MD;  Location: [facility] ENDOSCOPY;  Service: Cardiopulmonary;  Laterality: N/A;   Prior to Admission medications   Medication Sig Start Date End Date Taking? Authorizing Provider  albuterol (PROVENTIL) (2.5 MG/3ML) 0.083% nebulizer solution Take 2.5 mg by nebulization 4 (four) times daily. 11/30/12   Storm Frisk, MD  allopurinol (ZYLOPRIM) 100 MG tablet Take 100 mg by mouth daily.    Historical Provider, MD  Ascorbic Acid (VITAMIN C) 1000 MG tablet Take 1,000 mg by mouth daily.    Historical Provider, MD  beclomethasone (QVAR) 80 MCG/ACT inhaler Inhale 2 puffs into the lungs 2 (two) times daily. 03/25/13   Simonne Martinet, NP  cetirizine (ZYRTEC) 10 MG tablet Take 10 mg by mouth daily.    Historical Provider, MD  Cholecalciferol (VITAMIN D-3) 5000 UNITS TABS Take 5,000 Units by mouth daily.    Historical Provider, MD  fluticasone (FLONASE) 50 MCG/ACT nasal spray Place 1 spray into both nostrils daily as needed for allergies.    Historical Provider, MD  furosemide (LASIX) 40 MG tablet Take 40 mg by mouth daily.     Historical Provider, MD  guaiFENesin (MUCINEX) 600 MG 12 hr tablet Take 1 tablet (600 mg total) by  mouth 2 (two) times daily. 10/26/12   Laveda Norman, MD  HYDROcodone-acetaminophen (NORCO/VICODIN) 5-325 MG per tablet Take 1 tablet by mouth every 6 (six) hours as needed for pain (pain or cough). 03/25/13   Simonne Martinet, NP  ipratropium (ATROVENT) 0.02 % nebulizer solution Take 2.5 mLs (0.5 mg total) by nebulization 4 (four) times daily. Dx 496 04/11/13   Storm Frisk, MD  levothyroxine (SYNTHROID, LEVOTHROID) 50 MCG tablet Take 50 mcg by mouth daily before breakfast.    Historical  Provider, MD  montelukast (SINGULAIR) 10 MG tablet Take 1 tablet by mouth daily.    Historical Provider, MD  Multiple Vitamin (MULTIVITAMIN) capsule Take 1 capsule by mouth daily.    Historical Provider, MD  nystatin cream (MYCOSTATIN) Apply 1 application topically daily as needed (under breasts).    Historical Provider, MD  omeprazole (PRILOSEC) 20 MG capsule Take 1 capsule (20 mg total) by mouth daily. 11/30/12   Storm Frisk, MD  ondansetron (ZOFRAN) 4 MG tablet Take 1 tablet (4 mg total) by mouth every 8 (eight) hours as needed for nausea. 11/30/12   Storm Frisk, MD  potassium chloride SA (K-DUR,KLOR-CON) 20 MEQ tablet Take 60 mEq by mouth daily. 10/26/12   Laveda Norman, MD  Respiratory Therapy Supplies (FLUTTER) DEVI Use three times daily 03/31/13   Storm Frisk, MD   Allergies  Allergen Reactions  . Penicillins Rash  . Sulfonamide Derivatives Rash  . Tetanus Toxoid Other (See Comments)    Knot on arm    FAMILY HISTORY:  Family History  Problem Relation Age of Onset  . Asthma Mother   . Heart disease Mother   . Clotting disorder Mother   . Colon cancer Neg Hx   . Heart attack Father    SOCIAL HISTORY:  reports that she quit smoking about 5 years ago. Her smoking use included Cigarettes. She has a 129 pack-year smoking history. She has never used smokeless tobacco. She reports that she drinks alcohol. She reports that she does not use illicit drugs.  REVIEW OF SYSTEMS:   10 point review of system taken, please see HPI for positives and negatives.   SUBJECTIVE:   VITAL SIGNS: Temp:  [100.3 F (37.9 C)] 100.3 F (37.9 C) (10/30 1000) Pulse Rate:  [124] 124 (10/30 1000) BP: (94)/(62) 94/62 mmHg (10/30 1000) SpO2:  [93 %] 93 % (10/30 1000) Weight:  [207 lb (93.895 kg)] 207 lb (93.895 kg) (10/30 1000) HEMODYNAMICS:   VENTILATOR SETTINGS:   INTAKE / OUTPUT: Intake/Output   None     PHYSICAL EXAMINATION: General:  MO WF NAD @rest  Neuro:  Intact HEENT:  NO  JVD/LAN Cardiovascular:  HSR RRR Lungs:  Decreased in bases,+exp wheeze Abdomen:  Obese, tender +bs Musculoskeletal:  Intact Skin:  warm  LABS:  CBC No results found for this basename: WBC, HGB, HCT, PLT,  in the last 72 hours Coag's No results found for this basename: APTT, INR,  in the last 72 hours BMET No results found for this basename: NA, K, CL, CO2, BUN, CREATININE, GLUCOSE,  in the last 72 hours Electrolytes No results found for this basename: CALCIUM, MG, PHOS,  in the last 72 hours Sepsis Markers No results found for this basename: LACTICACIDVEN, PROCALCITON, O2SATVEN,  in the last 72 hours ABG No results found for this basename: PHART, PCO2ART, PO2ART,  in the last 72 hours Liver Enzymes No results found for this basename: AST, ALT, ALKPHOS, BILITOT, ALBUMIN,  in the  last 72 hours Cardiac Enzymes No results found for this basename: TROPONINI, PROBNP,  in the last 72 hours Glucose No results found for this basename: GLUCAP,  in the last 72 hours  Imaging No results found.   CXR: See above  ASSESSMENT / PLAN:  PULMONARY A:COPD/Recent pna/suspected aspiration/ Asthma   P:   O2 as needed Abxs/antivirals as per ID section IV steroids BD's Swallow evaluation  CARDIOVASCULAR A:CHF P:  Check Pro-BNP 12 lead I/O  RENAL A:  No acute issue P:     GASTROINTESTINAL A: Vomiting with a hx of GERD P:   IV PPI NPO x cept ice chips Antiemetic  Check KUB for completeness  HEMATOLOGIC No results found for this basename: HGB,  in the last 72 hours  A:  No acute issue P:    INFECTIOUS A:  Presumed non responsive pna and /or viral syndrome P:   -#0 vanc/primaxin/tamiflu Pan culture include viral panels  ENDOCRINE A:  DM 2   P:   -SSI  NEUROLOGIC A:  No Acute Issue P:     TODAY'S SUMMARY:  62 yo wf with a history of rheumatoid arthritis, recent pneumonia's x 3, suspected aspiration, who was just discharged from hospital 03-25-13 with  recurrent pneumonia. She completed cipro/.clinda  10/24 and prednisone 10/28.  She presents 10-30 with chills, white sputum productive, fever 100.5 and vomiting to the [place] office. She was evaluated and sent to [facility] SDU for further evaluation and treatment. We cannot rule out recurrent pneumonia and/or a viral process therefore we will treat both till further testing allow narrowing of treatment. She may need IV abx at home therefore a PICC will be placed.  Brett Canales Minor ACNP [facility] PCCM Pager [phone] till 3 pm If no answer page [phone] 04/14/2013, 11:27 AM  Levy Pupa, MD, PhD 04/14/2013, 5:28 PM [facility] [phone] or if no answer [phone]

## 2013-04-14 NOTE — Assessment & Plan Note (Signed)
Recurrent PNA ? Aspiration w/ n/v  ? SIRS   Plan  Admit to [facility] SDU  Pt refuses EMS transport, advised of dangers to husband/pt.  Pt to go directly to [facility] admitting for SDU bed awaiting  PCCM to admit .

## 2013-04-14 NOTE — Progress Notes (Signed)
Pt's BP 80/43 (50). HR 115, lab still unable to draw blood work.  Pt c/o chest discomfort says it is from reflux. EKG with ST, nonspec ST abnormality.   Brett Canales Minor ACNP called, will place order for PICC in IR and give 500cc NS bolus.

## 2013-04-14 NOTE — Telephone Encounter (Signed)
Let daughter know I am aware of situation but out of town until next wed.  My partners in hosp will help to care for her mother

## 2013-04-15 ENCOUNTER — Inpatient Hospital Stay (HOSPITAL_COMMUNITY): Payer: Medicare Other

## 2013-04-15 LAB — BASIC METABOLIC PANEL
BUN: 14 mg/dL (ref 6–23)
Chloride: 103 mEq/L (ref 96–112)
Creatinine, Ser: 0.83 mg/dL (ref 0.50–1.10)
GFR calc Af Amer: 86 mL/min — ABNORMAL LOW (ref >90)
GFR calc non Af Amer: 74 mL/min — ABNORMAL LOW (ref >90)
Potassium: 4 mEq/L (ref 3.5–5.1)

## 2013-04-15 LAB — LEGIONELLA ANTIGEN, URINE: Legionella Antigen, Urine: NEGATIVE

## 2013-04-15 LAB — CBC
HCT: 32.2 % — ABNORMAL LOW (ref 36.0–46.0)
Hemoglobin: 9.9 g/dL — ABNORMAL LOW (ref 12.0–15.0)
MCH: 25.1 pg — ABNORMAL LOW (ref 26.0–34.0)
MCHC: 30.7 g/dL (ref 30.0–36.0)
Platelets: 207 10*3/uL (ref 150–400)
RDW: 16.1 % — ABNORMAL HIGH (ref 11.5–15.5)

## 2013-04-15 LAB — URINE CULTURE: Colony Count: NO GROWTH

## 2013-04-15 LAB — STREP PNEUMONIAE URINARY ANTIGEN: Strep Pneumo Urinary Antigen: NEGATIVE

## 2013-04-15 LAB — INFLUENZA PANEL BY PCR (TYPE A & B): H1N1 flu by pcr: NOT DETECTED

## 2013-04-15 MED ORDER — LIDOCAINE HCL 1 % IJ SOLN
INTRAMUSCULAR | Status: AC
  Filled 2013-04-15: qty 20

## 2013-04-15 MED ORDER — PANTOPRAZOLE SODIUM 40 MG PO TBEC
40.0000 mg | DELAYED_RELEASE_TABLET | Freq: Every day | ORAL | Status: DC
  Administered 2013-04-15 – 2013-04-18 (×4): 40 mg via ORAL
  Filled 2013-04-15 (×5): qty 1

## 2013-04-15 MED ORDER — PREDNISONE 20 MG PO TABS
40.0000 mg | ORAL_TABLET | Freq: Two times a day (BID) | ORAL | Status: DC
  Administered 2013-04-15 – 2013-04-19 (×8): 40 mg via ORAL
  Filled 2013-04-15 (×12): qty 2

## 2013-04-15 NOTE — Progress Notes (Signed)
Given COPD GOLD CARD # [id number] and benefits explained

## 2013-04-15 NOTE — Evaluation (Signed)
Clinical/Bedside Swallow Evaluation Patient Details  Name: Jacqueline Washington MRN: [id number] Date of Birth: 1950/06/26  Today's Date: 04/15/2013 Time: 1302-1318 SLP Time Calculation (min): 16 min  Past Medical History:  Past Medical History  Diagnosis Date  . Asthma   . Rheumatoid arthritis(714.0)   . Emphysema     02 dependent 3L/min  . Emphysema   . Pneumonia   . Hypothyroidism   . Diabetes mellitus, type II   . Obesity   . Diastolic heart failure 09/22/2012    Grade 1  . Emphysema    Past Surgical History:  Past Surgical History  Procedure Laterality Date  . Eye surgery  at age 37  . Tubal ligation    . Colonoscopy    . Video bronchoscopy N/A 11/18/2012    Procedure: VIDEO BRONCHOSCOPY WITH FLUORO;  Surgeon: Storm Frisk, MD;  Location: [facility];  Service: Cardiopulmonary;  Laterality: N/A;   HPI:  62 yo wf with a history of rheumatoid arthritis, recent pneumonia's x 3, suspected aspiration, who was just discharged from hospital 03-25-13 with recurrent pneumonia. She completed cipro/.clinda  10/24 and prednisone 10/28.  She presents 10-30 with chills, white sputum productive, fever 100.5 and vomiting to the [place] office. She was evaluated and sent to [facility] SDU for further evaluation and treatment.  MBS on 10/18/12 with findings of a mild pharyngeal deficit with penetration of thin liquids into larynx ; no aspiration.  Assessment / Plan / Recommendation Clinical Impression  Pt presents with a functional oropharyngeal swallow:  there is swift, adequate mastication, timely swallow response, and no indications of airway compromise associated with PO intake.  RR compatible with resp/swallow sequence; pt exhales after 100% of liquid trials.  Phonation  and cough are strong.  Doubt prandial aspiration as contributor to respiratory issues.  Rec continued regular diet, thin liquids.  SLP to sign off.            Diet Recommendation Regular;Thin liquid   Liquid Administration via:  Cup;Straw Medication Administration: Whole meds with liquid Supervision: Patient able to self feed                      Swallow Study Prior Functional Status       General Date of Onset: 04/14/13 HPI: 62 yo wf with a history of rheumatoid arthritis, recent pneumonia's x 3, suspected aspiration, who was just discharged from hospital 03-25-13 with recurrent pneumonia. She completed cipro/.clinda  10/24 and prednisone 10/28.  She presents 10-30 with chills, white sputum productive, fever 100.5 and vomiting to the [place] office. She was evaluated and sent to [facility] SDU for further evaluation and treatment.  MBS on 10/18/12 with findings of a mild pharyngeal deficit with penetration of thin liquids into larynx Type of Study: Bedside swallow evaluation Previous Swallow Assessment: 10/18/12 MBS  Diet Prior to this Study: Regular;Thin liquids Temperature Spikes Noted: No Respiratory Status: Nasal cannula Behavior/Cognition: Alert;Cooperative;Pleasant mood Oral Cavity - Dentition: Dentures, top Self-Feeding Abilities: Able to feed self Patient Positioning: Upright in chair Baseline Vocal Quality: Clear Volitional Cough: Strong Volitional Swallow: Able to elicit    Oral/Motor/Sensory Function Overall Oral Motor/Sensory Function: Appears within functional limits for tasks assessed   Ice Chips Ice chips: Within functional limits Presentation: Spoon   Thin Liquid Thin Liquid: Within functional limits Presentation: Cup    Nectar Thick Nectar Thick Liquid: Not tested   Honey Thick Honey Thick Liquid: Not tested   Puree Puree: Within functional limits  Solid       Solid: Within functional limits Presentation: Self Fed       Blenda Mounts Laurice 04/15/2013,1:23 PM

## 2013-04-15 NOTE — Plan of Care (Signed)
Problem: Consults Goal: COPD Patient Education (See Patient Education Module for education specifics.)  Outcome: Completed/Met Date Met:  04/15/13 Education done by Lillia Abed, rn on 4th floor.

## 2013-04-15 NOTE — Progress Notes (Signed)
Pt received in room 1516. Alert and oriented. Vitals wnl. Pt placed on droplet precautions and aoriented to room. Vwilliams, rn.

## 2013-04-15 NOTE — Progress Notes (Signed)
PULMONARY  / CRITICAL CARE MEDICINE  Name: Jacqueline Washington MRN: [id number] DOB: 02/15/1951    ADMISSION DATE:  04/14/2013    PRIMARY SERVICE:PCM  CHIEF COMPLAINT:  Vomiting/cough/SOB  BRIEF PATIENT DESCRIPTION:  62 yo wf with a history of rheumatoid arthritis, recent pneumonia's x 3, suspected aspiration, who was just discharged from hospital 03-25-13 with recurrent pneumonia. She completed cipro/.clinda  10/24 and prednisone 10/28.  She presents 10-30 with chills, white sputum productive, fever 100.5 and vomiting to the [place] office. She was evaluated and sent to [facility] SDU for further evaluation and treatment. We cannot rule out recurrent pneumonia and/or a viral process therefore we will treat both till further testing allow narrowing of treatment. She may need IV abx at home therefore a PICC will be placed. SIGNIFICANT EVENTS / STUDIES:  10-30 readmitted  LINES / TUBES: 10-31 PICC >>  CULTURES: 10-30 bc x 2>> 10-30 sputum>> 10-30 uc>> 10-30 flu panel>>  ANTIBIOTICS/Antivirals: 10-30 vanc>> 10-30 primaxin>> 10-30 tamiflu>>  HISTORY OF PRESENT ILLNESS:   62 yo wf with a history of rheumatoid arthritis, recent pneumonia's x 3, suspected aspiration, who was just discharged from hospital 03-25-13 with recurrent pneumonia. She completed cipro/.clinda  10/24 and prednisone 10/28.  She presents 10-30 with chills, white sputum productive, fever 100.5 and vomiting to the [place] office. She was evaluated and sent to [facility] SDU for further evaluation and treatment. We cannot rule out recurrent pneumonia and/or a viral process therefore we will treat both till further testing allow narrowing of treatment. She may need IV abx at home therefore a PICC will be placed.   SUBJECTIVE:   VITAL SIGNS: Temp:  [97.5 F (36.4 C)-100.4 F (38 C)] 97.5 F (36.4 C) (10/31 0800) Pulse Rate:  [65-124] 65 (10/31 0800) Resp:  [18-32] 19 (10/31 0800) BP: (80-116)/(34-66) 113/58 mmHg (10/31 0800) SpO2:   [93 %-100 %] 95 % (10/31 0800) Weight:  [204 lb 2.3 oz (92.6 kg)-208 lb 1.8 oz (94.4 kg)] 204 lb 2.3 oz (92.6 kg) (10/31 0500) HEMODYNAMICS:   VENTILATOR SETTINGS:   INTAKE / OUTPUT: Intake/Output     10/30 0701 - 10/31 0700 10/31 0701 - 11/01 0700   I.V. (mL/kg) 600 (6.5) 50 (0.5)   Other 580    IV Piggyback 650 100   Total Intake(mL/kg) 1830 (19.8) 150 (1.6)   Urine (mL/kg/hr) 1600    Total Output 1600     Net +230 +150          PHYSICAL EXAMINATION: General:  MO WF NAD @rest , looks better Neuro:  Intact HEENT:  NO JVD/LAN Cardiovascular:  HSR RRR Lungs:  Decreased in bases Abdomen:  Obese, tender +bs Musculoskeletal:  Intact Skin:  warm  LABS:  CBC Recent Labs     04/14/13  2300  04/15/13  0340  WBC  8.1  6.8  HGB  9.9*  9.9*  HCT  31.6*  32.2*  PLT  219  207   Coag's Recent Labs     04/14/13  2300  APTT  35  INR  1.17   BMET Recent Labs     04/14/13  1420  04/15/13  0340  NA  132*  138  K  4.0  4.0  CL  96  103  CO2  25  25  BUN  10  14  CREATININE  0.82  0.83  GLUCOSE  87  164*   Electrolytes Recent Labs     04/14/13  1420  04/14/13  2300  04/15/13  0340  CALCIUM  8.8   --   9.4  MG  1.6   --    --   PHOS   --   3.1   --    Sepsis Markers Recent Labs     04/14/13  2300  PROCALCITON  0.24   ABG No results found for this basename: PHART, PCO2ART, PO2ART,  in the last 72 hours Liver Enzymes Recent Labs     04/14/13  1420  AST  20  ALT  13  ALKPHOS  76  BILITOT  0.5  ALBUMIN  2.0*   Cardiac Enzymes Recent Labs     04/14/13  2300  PROBNP  301.6*   Glucose No results found for this basename: GLUCAP,  in the last 72 hours  Imaging Ir Fluoro Guide Cv Line Right  04/15/2013   CLINICAL DATA:  Pneumonia, access for IV antibiotics  EXAM: Right upper extremity PICC LINE PLACEMENT WITH ULTRASOUND AND FLUOROSCOPIC GUIDANCE  FLUOROSCOPY TIME:  6 seconds  PROCEDURE: The patient was advised of the possible risks andcomplications  and agreed to undergo the procedure. The patient was then brought to the angiographic suite for the procedure.  The right arm was prepped with chlorhexidine, drapedin the usual sterile fashion using maximum barrier technique (cap and mask, sterile gown, sterile gloves, large sterile sheet, hand hygiene and cutaneous antisepsis) and infiltrated locally with 1% Lidocaine.  Ultrasound demonstrated patency of the right basilic vein, and this was documented with an image. Under real-time ultrasound guidance, this vein was accessed with a 21 gauge micropuncture needle and image documentation was performed. A 0.018 wire was introduced in to the vein. Over this, a 5 [place] double lumen power injectable PICC was advanced to the lower SVC/right atrial junction. Fluoroscopy during the procedure and fluoro spot radiograph confirms appropriate catheter position. The catheter was flushed and covered with asterile dressing.  Complications: No immediate  IMPRESSION: Successful right arm power PICC line placement with ultrasound and fluoroscopic guidance. The catheter is ready for use.   Electronically Signed   By: Ruel Favors M.D.   On: 04/15/2013 09:35   Ir US Guide Vasc Access Right  04/15/2013   CLINICAL DATA:  Pneumonia, access for IV antibiotics  EXAM: Right upper extremity PICC LINE PLACEMENT WITH ULTRASOUND AND FLUOROSCOPIC GUIDANCE  FLUOROSCOPY TIME:  6 seconds  PROCEDURE: The patient was advised of the possible risks andcomplications and agreed to undergo the procedure. The patient was then brought to the angiographic suite for the procedure.  The right arm was prepped with chlorhexidine, drapedin the usual sterile fashion using maximum barrier technique (cap and mask, sterile gown, sterile gloves, large sterile sheet, hand hygiene and cutaneous antisepsis) and infiltrated locally with 1% Lidocaine.  Ultrasound demonstrated patency of the right basilic vein, and this was documented with an image. Under real-time  ultrasound guidance, this vein was accessed with a 21 gauge micropuncture needle and image documentation was performed. A 0.018 wire was introduced in to the vein. Over this, a 5 [place] double lumen power injectable PICC was advanced to the lower SVC/right atrial junction. Fluoroscopy during the procedure and fluoro spot radiograph confirms appropriate catheter position. The catheter was flushed and covered with asterile dressing.  Complications: No immediate  IMPRESSION: Successful right arm power PICC line placement with ultrasound and fluoroscopic guidance. The catheter is ready for use.   Electronically Signed   By: Ruel Favors M.D.   On: 04/15/2013 09:35   Dg Chest Port 1  View  04/15/2013   CLINICAL DATA:  Pneumonia.  EXAM: PORTABLE CHEST - 1 VIEW  COMPARISON:  03/31/2013  FINDINGS: There is biapical scarring with volume loss and elevation of the hila. No calcification seen. Interval increased ill-defined density at the left apex.  No effusion or pneumothorax. No acute osseous findings.  IMPRESSION: 1. Increased density at the left apex, concerning for pneumonia. 2. Biapical lung scarring, pattern favoring sarcoidosis, remote atypical infection, or pneumoconiosis.   Electronically Signed   By: Tiburcio Pea M.D.   On: 04/15/2013 05:30   Dg Abd Portable 1v  04/14/2013   CLINICAL DATA:  abd distention  EXAM: PORTABLE ABDOMEN - 1 VIEW  COMPARISON:  Chest radiograph 03/31/2013  FINDINGS: There are no dilated loops of large or small bowel. No pathologic calcifications. Exam is suboptimal due to patient body habitus. No aggressive osseous lesion  IMPRESSION: No acute abdominal findings.   Electronically Signed   By: Genevive Bi M.D.   On: 04/14/2013 13:39     CXR: See above  ASSESSMENT / PLAN:  PULMONARY A:COPD B apical airspace disease, ? Lung necrosis / chronic PNA's Chronic cough (predates these PNA's) P:   O2 as needed Abxs/antivirals as per ID section; suspect will require long  course IV steroids > change to prednisone 10-31 BD's Swallow evaluation ordered 10-31  CARDIOVASCULAR A:CHF, BNP 301 P:  Tight I/O  RENAL A:  No acute issue P:     GASTROINTESTINAL A: Vomiting with a hx of GERD, KUB negative P:   IV PPI 10-31 advance diet Antiemetic prn  HEMATOLOGIC  Recent Labs  04/14/13 2300 04/15/13 0340  HGB 9.9* 9.9*    A:  No acute issue P:    INFECTIOUS A:  Presumed non responsive pna and /or viral syndrome P:   -#2 vanc/primaxin/tamiflu Pan culture include viral panels She may need home abx or prolonged course of po abx. Continue current regimen for now 10-31.   ENDOCRINE CBG (last 3)  No results found for this basename: GLUCAP,  in the last 72 hours   A:  DM 2   P:   -SSI  NEUROLOGIC A:  No Acute Issue P:     TODAY'S SUMMARY:  10-31 much improved on IV abx and resumption of steroids. We will change to PO prednisone, continue current IV abx + antivirals, transfer to floor. Swallow evaluation and viral panel are pending. Vomiting is a chronic issue and we continue antiemetics. ? Whether she will need long-term abx course, possibly IV at home (PICC placed)  Brett Canales Minor ACNP [facility] P[facility] Pager [phone] till 3 pm If no answer page [phone] 04/15/2013, 9:47 AM  Levy Pupa, MD, PhD 04/15/2013, 10:41 AM [facility] [phone] or if no answer [phone]

## 2013-04-15 NOTE — Progress Notes (Signed)
At about 1515  patient assisted to use the bathroom. Noted on incontinence pad was an area saturated with slightly dark brown discoloration. To rule out the possibility of it being blood, a sanitary path was applied. At this time no bleeding noted on pad. MD paged x 1; but no response. Will continue to monitor patient for bleeding. Jacqueline Washington,.

## 2013-04-15 NOTE — Procedures (Signed)
Successful RUE DL POWER PICC TIP SVC/RA NO COMP STABLE READY FOR USE  

## 2013-04-16 DIAGNOSIS — R5381 Other malaise: Secondary | ICD-10-CM

## 2013-04-16 DIAGNOSIS — I5032 Chronic diastolic (congestive) heart failure: Secondary | ICD-10-CM

## 2013-04-16 DIAGNOSIS — E119 Type 2 diabetes mellitus without complications: Secondary | ICD-10-CM

## 2013-04-16 DIAGNOSIS — E43 Unspecified severe protein-calorie malnutrition: Secondary | ICD-10-CM

## 2013-04-16 DIAGNOSIS — M069 Rheumatoid arthritis, unspecified: Secondary | ICD-10-CM

## 2013-04-16 DIAGNOSIS — K219 Gastro-esophageal reflux disease without esophagitis: Secondary | ICD-10-CM

## 2013-04-16 LAB — BASIC METABOLIC PANEL
BUN: 18 mg/dL (ref 6–23)
CO2: 26 mEq/L (ref 19–32)
Chloride: 107 mEq/L (ref 96–112)
Glucose, Bld: 141 mg/dL — ABNORMAL HIGH (ref 70–99)
Potassium: 3.9 mEq/L (ref 3.5–5.1)
Sodium: 140 mEq/L (ref 135–145)

## 2013-04-16 LAB — VANCOMYCIN, TROUGH: Vancomycin Tr: 20.9 ug/mL — ABNORMAL HIGH (ref 10.0–20.0)

## 2013-04-16 MED ORDER — SODIUM CHLORIDE 0.9 % IJ SOLN
10.0000 mL | INTRAMUSCULAR | Status: DC | PRN
  Administered 2013-04-16: 10 mL
  Administered 2013-04-16: 20 mL
  Administered 2013-04-17 – 2013-04-19 (×4): 10 mL

## 2013-04-16 MED ORDER — VANCOMYCIN HCL IN DEXTROSE 1-5 GM/200ML-% IV SOLN
1000.0000 mg | Freq: Once | INTRAVENOUS | Status: AC
  Administered 2013-04-16: 1000 mg via INTRAVENOUS
  Filled 2013-04-16: qty 200

## 2013-04-16 MED ORDER — VANCOMYCIN HCL IN DEXTROSE 1-5 GM/200ML-% IV SOLN
1000.0000 mg | Freq: Two times a day (BID) | INTRAVENOUS | Status: DC
  Administered 2013-04-17 – 2013-04-19 (×5): 1000 mg via INTRAVENOUS
  Filled 2013-04-16 (×7): qty 200

## 2013-04-16 NOTE — Progress Notes (Signed)
PULMONARY  / CRITICAL CARE MEDICINE  Name: Jacqueline Washington MRN: [id number] DOB: 07/07/1950    ADMISSION DATE:  04/14/2013    PRIMARY SERVICE:PCM  CHIEF COMPLAINT:  Vomiting/cough/SOB  BRIEF PATIENT DESCRIPTION:  62 yo wf with a history of rheumatoid arthritis, recent pneumonia's x 3, suspected aspiration, who was just discharged from hospital 03-25-13 with recurrent pneumonia. She completed cipro/.clinda  10/24 and prednisone 10/28.  She presents 10-30 with chills, white sputum productive, fever 100.5 and vomiting to the [place] office. She was evaluated and sent to [facility] SDU for further evaluation and treatment. We cannot rule out recurrent pneumonia and/or a viral process therefore we will treat both till further testing allow narrowing of treatment. She may need IV abx at home therefore a PICC will be placed.  SIGNIFICANT EVENTS / STUDIES:  10-30 readmitted  LINES / TUBES: 10-31 PICC >>  CULTURES:  PCT=0.24 10-30 bc x 2>> neg 10-30 sputum>> not acceptable 10-30 uc>> neg 10-30 flu panel>> NEG 10/30 Strep UAg>> neg 10/30 Legionella>> neg  ANTIBIOTICS/Antivirals: 10-30 vanc>> 10-30 primaxin>> 10-30 tamiflu>> stop 11/1  . albuterol  2.5 mg Nebulization Q6H   And  . ipratropium  0.5 mg Nebulization Q6H  . heparin  5,000 Units Subcutaneous Q8H  . imipenem-cilastatin  500 mg Intravenous Q6H  . oseltamivir  75 mg Oral BID  . pantoprazole  40 mg Oral Q1200  . predniSONE  40 mg Oral BID WC  . vancomycin  1,250 mg Intravenous Q12H    SUBJECTIVE:   VITAL SIGNS: Temp:  [98.2 F (36.8 C)-98.3 F (36.8 C)] 98.3 F (36.8 C) (11/01 0600) Pulse Rate:  [79-82] 79 (11/01 0600) Resp:  [19-22] 20 (11/01 0600) BP: (105-111)/(58-66) 111/66 mmHg (11/01 0600) SpO2:  [95 %-97 %] 97 % (11/01 0831) Weight:  [93.305 kg (205 lb 11.2 oz)] 93.305 kg (205 lb 11.2 oz) (11/01 0600) HEMODYNAMICS:   VENTILATOR SETTINGS:   INTAKE / OUTPUT: Intake/Output     10/31 0701 - 11/01 0700 11/01 0701  - 11/02 0700   P.O. 480 240   I.V. (mL/kg) 1093.3 (11.7)    Other     IV Piggyback 900    Total Intake(mL/kg) 2473.3 (26.5) 240 (2.6)   Urine (mL/kg/hr) 800 (0.4) 300 (0.7)   Total Output 800 300   Net +1673.3 -60        Stool Occurrence  1 x    PHYSICAL EXAMINATION: General:  MO WF NAD @rest , looks better Neuro:  Intact HEENT:  NO JVD/LAN Cardiovascular:  HSR RRR Lungs:  Decreased in bases Abdomen:  Obese, tender +bs Musculoskeletal:  Intact Skin:  warm  LABS:  CBC Recent Labs     04/14/13  2300  04/15/13  0340  WBC  8.1  6.8  HGB  9.9*  9.9*  HCT  31.6*  32.2*  PLT  219  207   Coag's Recent Labs     04/14/13  2300  APTT  35  INR  1.17   BMET Recent Labs     04/14/13  1420  04/15/13  0340  04/16/13  0610  NA  132*  138  140  K  4.0  4.0  3.9  CL  96  103  107  CO2  25  25  26   BUN  10  14  18   CREATININE  0.82  0.83  0.75  GLUCOSE  87  164*  141*   Electrolytes Recent Labs     04/14/13  1420  04/14/13  2300  04/15/13  0340  04/16/13  0610  CALCIUM  8.8   --   9.4  9.1  MG  1.6   --    --    --   PHOS   --   3.1   --    --    Sepsis Markers Recent Labs     04/14/13  2300  PROCALCITON  0.24   ABG No results found for this basename: PHART, PCO2ART, PO2ART,  in the last 72 hours Liver Enzymes Recent Labs     04/14/13  1420  AST  20  ALT  13  ALKPHOS  76  BILITOT  0.5  ALBUMIN  2.0*   Cardiac Enzymes Recent Labs     04/14/13  2300  PROBNP  301.6*   Glucose Recent Labs     04/16/13  0753  GLUCAP  136*    Imaging:  CXR 10/31:  IMPRESSION:  1. Increased density at the left apex, concerning for pneumonia.  2. Biapical lung scarring, pattern favoring sarcoidosis, remote atypical infection, or pneumoconiosis.  CTChest 10/9:  IMPRESSION:  1. Interval mild left upper lobe pneumonia.  2. Stable dense right upper lobe pneumonia/chronic atelectasis.  3. Mildly improved focal probable pneumonia in the right lower lobe.  4.  Mildly improved bilateral nodularity, compatible with a mildly improving infectious process.  5. Stable mild mediastinal adenopathy, most likely reactive.  6. Stable extensive changes of COPD and cylindrical bronchiectasis.  7. Small left pleural effusion with a minimal increase in amount.  8. Results right pleural effusion.   ASSESSMENT / PLAN:  PULMONARY A:COPD B apical airspace disease, ? Lung necrosis / chronic PNA's Chronic cough (predates these PNA's) P:   O2 as needed Abxs/antivirals as per ID section; suspect will require long course IV steroids > change to prednisone 10-31 & improved BD's Swallow evaluation ordered   CARDIOVASCULAR A:CHF, BNP 301 P:  Tight I/O  RENAL A:  No acute issue P:    GASTROINTESTINAL A: Vomiting with a hx of GERD, KUB negative P:   IV PPI 10-31 advance diet Antiemetic prn  HEMATOLOGIC  Recent Labs  04/14/13 2300 04/15/13 0340  HGB 9.9* 9.9*  A:  No acute issue P:    INFECTIOUS A:  Presumed non responsive pna and /or viral syndrome P:   #3 vanc/primaxin/tamiflu => DC tamiflu 11/1 w/ neg Viral panel Pan culture include viral panels She may need home abx or prolonged course of po abx. Continue current regimen for now 10-31.   ENDOCRINE CBG (last 3)   Recent Labs  04/16/13 0753  GLUCAP 136*  A:  DM 2   P:   -SSI  NEUROLOGIC A:  No Acute Issue P:     TODAY'S SUMMARY:  10-31 much improved on IV abx and resumption of steroids. We will change to PO prednisone, continue current IV abx + antivirals, transfer to floor. Swallow evaluation and viral panel are pending. Vomiting is a chronic issue and we continue antiemetics. ? Whether she will need long-term abx course, possibly IV at home (PICC placed) 11/1 DC Tamiflu w/ neg viral panel (& she had the 2014 Flu shot)   [employer]. Kriste Basque, MD  04/16/2013, 11:22 AM [facility]

## 2013-04-16 NOTE — Progress Notes (Signed)
ANTIBIOTIC CONSULT NOTE - Follow Up  Pharmacy Consult for Vancomycin Indication: rule out pneumonia  Allergies  Allergen Reactions  . Penicillins Rash  . Sulfonamide Derivatives Rash  . Tetanus Toxoid Other (See Comments)    Knot on arm    Patient Measurements: Height: 5\' 4"  (162.6 cm) Weight: 205 lb 11.2 oz (93.305 kg) IBW/kg (Calculated) : 54.7  Vital Signs: Temp: 97.7 F (36.5 C) (11/01 1556) Temp src: Oral (11/01 1556) BP: 117/74 mmHg (11/01 1556) Pulse Rate: 103 (11/01 1556)  Labs:  Recent Labs  04/14/13 1420 04/14/13 2300 04/15/13 0340 04/16/13 0610  WBC  --  8.1 6.8  --   HGB  --  9.9* 9.9*  --   PLT  --  219 207  --   CREATININE 0.82  --  0.83 0.75   Estimated Creatinine Clearance: 80.7 ml/min (by C-G formula based on Cr of 0.75).  Microbiology: 10/30 Blood x2: ngtd 10/30 UA/ Urine: NGF 10/30 sputum: reincubating 10/30 Strep pneumo Ur Ag (-) 10/30 Legionella Ur Ag (-) 10/30 Influenza PCR panel (-)   Assessment: 62 yo F admitted 10/30 with possible recurrent pneumonia.  She presented to outpatient MD office with c/o vomiting, cough, SOB, and fever after recent hospitalization at [facility] for pneumonia from 10/7 - 10/10; IV antibiotics used while inpatient, but discharged on cipro/clinda PO.  Now, concerned for recurrent pneumonia and/or viral process.  Pharmacy is consulted to dose vancomycin and Primaxin.  Day #3 Vancomycin 1250 mg IV q12h and Primaxin 500 mg IV q6h.  Oseltamivir discontinued today following negative viral panel results.  Vancomycin trough 20.9  Afebrile, WBC WNL  SCr 0.75, CrCl~ 87 ml/min/1.65m2 (normalized), >100 ml/min (CG)  Goal of Therapy:  Vancomycin trough level 15-20 mcg/ml Appropriate abx dosing, eradication of infection.   Plan:   Reduce vancomycin dose to 1g IV q12h.  VT just above therapeutic goal and want to avoid further accumulation above goal.  Primaxin dose remains appropriate.  Pharmacy will continue to  follow.  Clance Boll, PharmD, BCPS Pager: [phone] 04/16/2013 5:59 PM

## 2013-04-17 LAB — URINALYSIS, ROUTINE W REFLEX MICROSCOPIC
Glucose, UA: NEGATIVE mg/dL
Hgb urine dipstick: NEGATIVE
Leukocytes, UA: NEGATIVE
Protein, ur: NEGATIVE mg/dL
Specific Gravity, Urine: 1.017 (ref 1.005–1.030)
pH: 5.5 (ref 5.0–8.0)

## 2013-04-17 LAB — CULTURE, RESPIRATORY W GRAM STAIN

## 2013-04-17 MED ORDER — HYDROCODONE-ACETAMINOPHEN 5-325 MG PO TABS
1.0000 | ORAL_TABLET | Freq: Four times a day (QID) | ORAL | Status: DC | PRN
  Administered 2013-04-17 – 2013-04-18 (×2): 1 via ORAL
  Filled 2013-04-17 (×2): qty 1

## 2013-04-17 NOTE — Progress Notes (Signed)
eLink Physician-Brief Progress Note Patient Name: Jacqueline Washington DOB: [birth year] MRN: [id number]  Date of Service  04/17/2013   HPI/Events of Note  Call from nurse stating patient is requesting pain medication.  Takes Norco at home prn pain.  Is HD stable.   eICU Interventions  Plan: Order for Norco 5/325 1 po q6 hours prn pain.   Intervention Category Intermediate Interventions: Pain - evaluation and management  Bilan Tedesco 04/17/2013, 11:28 PM

## 2013-04-17 NOTE — Progress Notes (Signed)
Pt seeing and asked if aware of patient's complaint of vaginal bleed. Verbal order given for UA and culture with in and out cath. Vwilliams,rn.

## 2013-04-17 NOTE — Progress Notes (Signed)
PULMONARY  / CRITICAL CARE MEDICINE  Name: Jacqueline Washington MRN: [id number] DOB: August 15, 1950    ADMISSION DATE:  04/14/2013   PRIMARY SERVICE:PCM  CHIEF COMPLAINT:  Vomiting/cough/SOB  BRIEF PATIENT DESCRIPTION:  62 yo wf with a history of rheumatoid arthritis, recent pneumonia's x 3, suspected aspiration, who was just discharged from hospital 03-25-13 with recurrent pneumonia. She completed cipro/.clinda  10/24 and prednisone 10/28.  She presents 10-30 with chills, white sputum productive, fever 100.5 and vomiting to the [place] office. She was evaluated and sent to [facility] SDU for further evaluation and treatment. We cannot rule out recurrent pneumonia and/or a viral process therefore we will treat both till further testing allow narrowing of treatment. She may need IV abx at home therefore a PICC will be placed.  SIGNIFICANT EVENTS / STUDIES:  10-30 readmitted  LINES / TUBES: 10-31 PICC >>  CULTURES:  PCT=0.24 10-30 bc x 2>> neg 10-30 sputum>> not acceptable 10-30 uc>> neg 10-30 flu panel>> NEG 10/30 Strep UAg>> neg 10/30 Legionella>> neg  ANTIBIOTICS/Antivirals: 10-30 vanc>> 10-30 primaxin>> 10-30 tamiflu>> stop 11/1  . albuterol  2.5 mg Nebulization Q6H   And  . ipratropium  0.5 mg Nebulization Q6H  . heparin  5,000 Units Subcutaneous Q8H  . imipenem-cilastatin  500 mg Intravenous Q6H  . pantoprazole  40 mg Oral Q1200  . predniSONE  40 mg Oral BID WC  . vancomycin  1,000 mg Intravenous Q12H    SUBJECTIVE:   VITAL SIGNS: Temp:  [97.7 F (36.5 C)-97.9 F (36.6 C)] 97.9 F (36.6 C) (11/02 9528) Pulse Rate:  [73-103] 73 (11/02 0633) Resp:  [20] 20 (11/02 4132) BP: (117-125)/(74-75) 125/75 mmHg (11/02 0633) SpO2:  [95 %-98 %] 98 % (11/02 0838) Weight:  [95.2 kg (209 lb 14.1 oz)] 95.2 kg (209 lb 14.1 oz) (11/02 0454) HEMODYNAMICS:   VENTILATOR SETTINGS:   INTAKE / OUTPUT: Intake/Output     11/01 0701 - 11/02 0700 11/02 0701 - 11/03 0700   P.O. 480 360   I.V.  (mL/kg) 1020 (10.7) 10 (0.1)   IV Piggyback     Total Intake(mL/kg) 1500 (15.8) 370 (3.9)   Urine (mL/kg/hr) 1700 (0.7) 300 (0.6)   Total Output 1700 300   Net -200 +70        Stool Occurrence 1 x 1 x    PHYSICAL EXAMINATION: General:  MO WF NAD @rest , looks better Neuro:  Intact HEENT:  NO JVD/LAN Cardiovascular:  HSR RRR Lungs:  Decreased in bases Abdomen:  Obese, tender +bs Musculoskeletal:  Intact Skin:  warm  LABS:  CBC Recent Labs     04/14/13  2300  04/15/13  0340  WBC  8.1  6.8  HGB  9.9*  9.9*  HCT  31.6*  32.2*  PLT  219  207   Coag's Recent Labs     04/14/13  2300  APTT  35  INR  1.17   BMET Recent Labs     04/14/13  1420  04/15/13  0340  04/16/13  0610  NA  132*  138  140  K  4.0  4.0  3.9  CL  96  103  107  CO2  25  25  26   BUN  10  14  18   CREATININE  0.82  0.83  0.75  GLUCOSE  87  164*  141*   Electrolytes Recent Labs     04/14/13  1420  04/14/13  2300  04/15/13  0340  04/16/13  4401  CALCIUM  8.8   --   9.4  9.1  MG  1.6   --    --    --   PHOS   --   3.1   --    --    Sepsis Markers Recent Labs     04/14/13  2300  PROCALCITON  0.24   ABG No results found for this basename: PHART, PCO2ART, PO2ART,  in the last 72 hours Liver Enzymes Recent Labs     04/14/13  1420  AST  20  ALT  13  ALKPHOS  76  BILITOT  0.5  ALBUMIN  2.0*   Cardiac Enzymes Recent Labs     04/14/13  2300  PROBNP  301.6*   Glucose Recent Labs     04/16/13  0753  04/16/13  1128  04/16/13  1650  GLUCAP  136*  149*  143*    Imaging:  CXR 10/31:  IMPRESSION:  1. Increased density at the left apex, concerning for pneumonia.  2. Biapical lung scarring, pattern favoring sarcoidosis, remote atypical infection, or pneumoconiosis.  CTChest 10/9:  IMPRESSION:  1. Interval mild left upper lobe pneumonia.  2. Stable dense right upper lobe pneumonia/chronic atelectasis.  3. Mildly improved focal probable pneumonia in the right lower lobe.  4.  Mildly improved bilateral nodularity, compatible with a mildly improving infectious process.  5. Stable mild mediastinal adenopathy, most likely reactive.  6. Stable extensive changes of COPD and cylindrical bronchiectasis.  7. Small left pleural effusion with a minimal increase in amount.  8. Results right pleural effusion.   ASSESSMENT / PLAN:  PULMONARY A:COPD B apical airspace disease, ? Lung necrosis / chronic PNA's/ Bronchiectasis Chronic cough (predates these PNA's) P:   O2 as needed Abxs/antivirals as per ID section; suspect will require long course IV steroids > change to prednisone 10-31 & improved BD's Swallow evaluation ordered   CARDIOVASCULAR A:CHF, BNP 301 P:  Tight I/O  RENAL A:  No acute issue P:    GASTROINTESTINAL A: Vomiting with a hx of GERD, KUB negative P:   IV PPI 10-31 advance diet Antiemetic prn  HEMATOLOGIC  Recent Labs  04/14/13 2300 04/15/13 0340  HGB 9.9* 9.9*  A:  No acute issue P:   INFECTIOUS A:  Presumed non responsive pna and /or viral syndrome P:   #3 vanc/primaxin/tamiflu => DC tamiflu 11/1 w/ neg Viral panel Pan culture include viral panels She may need home abx or prolonged course of po abx. Continue current regimen for now 10-31.   ENDOCRINE CBG (last 3)   Recent Labs  04/16/13 0753 04/16/13 1128 04/16/13 1650  GLUCAP 136* 149* 143*  A:  DM 2   P:   -SSI  NEUROLOGIC A:  No Acute Issue P:     TODAY'S SUMMARY:  10-31 much improved on IV abx and resumption of steroids. We will change to PO prednisone, continue current IV abx + antivirals, transfer to floor. Swallow evaluation and viral panel are pending. Vomiting is a chronic issue and we continue antiemetics. ? Whether she will need long-term abx course, possibly IV at home (PICC placed) 11/1 DC Tamiflu w/ neg viral panel (& she had the 2014 Flu shot) 11/2 stable on Pred, Primaxin/ Vanco- need decision re continued antibiotic treatment as  outpt...   Lonzo Cloud. Kriste Basque, MD  04/17/2013, 11:59 AM  Pulmonary

## 2013-04-18 ENCOUNTER — Inpatient Hospital Stay (HOSPITAL_COMMUNITY): Payer: Medicare Other

## 2013-04-18 LAB — CBC WITH DIFFERENTIAL/PLATELET
Basophils Absolute: 0 10*3/uL (ref 0.0–0.1)
Eosinophils Absolute: 0 10*3/uL (ref 0.0–0.7)
Eosinophils Relative: 0 % (ref 0–5)
HCT: 29.6 % — ABNORMAL LOW (ref 36.0–46.0)
MCH: 25.4 pg — ABNORMAL LOW (ref 26.0–34.0)
MCHC: 30.7 g/dL (ref 30.0–36.0)
MCV: 82.7 fL (ref 78.0–100.0)
Monocytes Absolute: 0.7 10*3/uL (ref 0.1–1.0)
Platelets: 145 10*3/uL — ABNORMAL LOW (ref 150–400)
RDW: 16.4 % — ABNORMAL HIGH (ref 11.5–15.5)

## 2013-04-18 LAB — URINE CULTURE
Colony Count: NO GROWTH
Culture: NO GROWTH
Special Requests: NORMAL

## 2013-04-18 LAB — HEMOGLOBIN A1C: Hgb A1c MFr Bld: 5.9 % — ABNORMAL HIGH (ref <5.7)

## 2013-04-18 LAB — RHEUMATOID FACTOR: Rhuematoid fact SerPl-aCnc: 271 IU/mL — ABNORMAL HIGH (ref <=14)

## 2013-04-18 MED ORDER — TIOTROPIUM BROMIDE MONOHYDRATE 18 MCG IN CAPS
18.0000 ug | ORAL_CAPSULE | Freq: Every day | RESPIRATORY_TRACT | Status: DC
  Administered 2013-04-18 – 2013-04-19 (×2): 18 ug via RESPIRATORY_TRACT
  Filled 2013-04-18: qty 5

## 2013-04-18 MED ORDER — ALBUTEROL SULFATE (5 MG/ML) 0.5% IN NEBU: 2.5000 mg | INHALATION_SOLUTION | RESPIRATORY_TRACT | Status: DC | PRN

## 2013-04-18 MED ORDER — FUROSEMIDE 20 MG PO TABS
20.0000 mg | ORAL_TABLET | Freq: Once | ORAL | Status: AC
  Administered 2013-04-18: 20 mg via ORAL
  Filled 2013-04-18: qty 1

## 2013-04-18 MED ORDER — ACETAMINOPHEN 325 MG PO TABS: 650.0000 mg | ORAL_TABLET | Freq: Four times a day (QID) | ORAL | Status: DC | PRN

## 2013-04-18 MED ORDER — ARFORMOTEROL TARTRATE 15 MCG/2ML IN NEBU
15.0000 ug | INHALATION_SOLUTION | Freq: Two times a day (BID) | RESPIRATORY_TRACT | Status: DC
  Administered 2013-04-18 – 2013-04-19 (×2): 15 ug via RESPIRATORY_TRACT
  Filled 2013-04-18 (×5): qty 2

## 2013-04-18 NOTE — Progress Notes (Signed)
[facility]  Patient Status: New  [facility] is providing the following services: RN- home infusion pharmacy for home IV antibiotics.  [facility] hospital team will follow and support transition to home when deemed appropriate.  If patient discharges after hours, please call [phone].   Sedalia Muta 04/18/2013, 3:23 PM

## 2013-04-18 NOTE — Progress Notes (Signed)
Pt will possibly need IV antibiotics at d/c. She has used [facility] in the past and wishes to use them again.  She stated she can learn how to administer the antibiotics but also has 2 grand-daughters that are CNA's that are teachable caregivers as well. Referral has been made to [facility].  Algernon Huxley, RN BSN   [phone]

## 2013-04-18 NOTE — Progress Notes (Signed)
PULMONARY  / CRITICAL CARE MEDICINE  Name: Jacqueline FORSMAN MRN: [id number] DOB: [birth year]    ADMISSION DATE:  04/14/2013   PRIMARY SERVICE:PCM  CHIEF COMPLAINT:  Vomiting/cough/SOB  BRIEF PATIENT DESCRIPTION:  62 yo female former smoker with recurrent PNA and hx of RA, COPD d/c from hospital 03/25/13 on Abx, and prednisone.  She had recurrence of sputum, temp 100.5, chills, and vomiting 04/14/13 presenting to ED.  PCCM asked to admit.  She is followed by Dr. Delford Field in pulmonary office.   SIGNIFICANT EVENTS: 10/30 readmitted 10/31 Swallow evaluation >> Regular diet, thin liquids  STUDIES:  08/12/11 Hypersensitivity panel >> negative 08/12/11 Fungal Ab panel >> Positive Aspergillus fumigatus 09/26/11 PFT >> FEV1 1.23 (54%), FEV1% 48, TLC 4.38 (90%), DLCO 39%, no BD response 10/18/12 Echo >> EF 60 to 65%, grade 1 diastolic dysfx, PAS 43 mmHg 11/17/12 Bronchoscopy >> cytology negative, Cx negative 03/22/13 IgG 677, IgA 107, IgM 71 03/24/13 CT chest >> Biapical cylindrical BTX, extensive b/l bullous changes, RUL consolidation and smaller LUL infiltrate, small Lt effusion, centrilobular emphysema  LINES / TUBES: 10-31 PICC >>  CULTURES:  10-30 bc x 2>> negative 10-30 uc>> negative 10-30 flu panel>> negative 10/30 Strep UAg>> negative 10/30 Legionella>> negative  ANTIBIOTICS: 10-30 vanc>> 10-30 primaxin>> 10-30 tamiflu>> 11/1   SUBJECTIVE:  Still has cough, but not as much sputum.  Denies chest pain.  Intermittently reports having spotting on pads when she coughs >> she is not sure if she is passing urine or if bleeding coming from vagina.  VITAL SIGNS: Temp:  [97.7 F (36.5 C)-98 F (36.7 C)] 97.8 F (36.6 C) (11/03 0646) Pulse Rate:  [80-104] 89 (11/03 0657) Resp:  [20] 20 (11/03 0646) BP: (129-175)/(77-82) 150/82 mmHg (11/03 0657) SpO2:  [95 %-98 %] 96 % (11/03 0646) 3 liters [place]  INTAKE / OUTPUT: Intake/Output     11/02 0701 - 11/03 0700 11/03 0701 - 11/04  0700   P.O. 920    I.V. (mL/kg) 1064.2 (11.2)    IV Piggyback 800    Total Intake(mL/kg) 2784.2 (29.2)    Urine (mL/kg/hr) 2200 (1)    Total Output 2200     Net +584.2          Urine Occurrence 2 x 1 x   Stool Occurrence 2 x     PHYSICAL EXAMINATION: General: No distress Neuro: normal strength HEENT:  No sinus tenderness Cardiovascular: regular, no murmur Lungs: scattered rhonchi Abdomen: soft, non tender Musculoskeletal: no edema Skin: no rashes  LABS:  CBC No results found for this basename: WBC, HGB, HCT, PLT,  in the last 72 hours  BMET Recent Labs     04/16/13  0610  NA  140  K  3.9  CL  107  CO2  26  BUN  18  CREATININE  0.75  GLUCOSE  141*   Electrolytes Recent Labs     04/16/13  0610  CALCIUM  9.1   Glucose Recent Labs     04/16/13  0753  04/16/13  1128  04/16/13  1650  GLUCAP  136*  149*  143*   Urinalysis    Component Value Date/Time   COLORURINE YELLOW 04/17/2013 1347   APPEARANCEUR CLEAR 04/17/2013 1347   LABSPEC 1.017 04/17/2013 1347   PHURINE 5.5 04/17/2013 1347   GLUCOSEU NEGATIVE 04/17/2013 1347   HGBUR NEGATIVE 04/17/2013 1347   BILIRUBINUR NEGATIVE 04/17/2013 1347   KETONESUR NEGATIVE 04/17/2013 1347   PROTEINUR NEGATIVE 04/17/2013 1347   UROBILINOGEN 0.2  04/17/2013 1347   NITRITE NEGATIVE 04/17/2013 1347   LEUKOCYTESUR NEGATIVE 04/17/2013 1347    Imaging: No results found.   ASSESSMENT / PLAN:  A:  Chronic cough and recurrent pneumonia in setting of RA, BTX, and COPD. P: -oxygen to keep SpO2 > 92% -f/u PA/lateral CXR 11/03 -change nebs to brovana, spiriva, and prn albuterol >> may have difficulty with outpt medications due to limit finances -continue prednisone 40 mg bid >> wean off slowly as tolerated -Day 5 of Abx >> currently on primaxin, vancomycin >> may need to arrange for outpt Abx through PICC line -check ESR, RF, ANA, IgE, CBC with differential -will need outpt f/u with rheumatology (Dr. Azzie Roup)  A: Chronic diastolic heart failure. P: -lasix 20 mg x one on 11/03  A: Vomiting prior to admission >> resolved. Hx of GERD. 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 P: -SSI -check HbA1C  A: Pain control. P: -prn tylenol, norco  A: ?intermittent vaginal bleeding. P: -will need outpt GYN evaluation unless bleeding gets worse >> then would need inpt evaluation -d/c SQ heparin >> use SCD for DVT prevention  Might be ready for d/c home 11/04.  Coralyn Helling, MD [facility] Pulmonary/Critical Care 04/18/2013, 9:30 AM Pager:  [phone] After 3pm call: [phone]

## 2013-04-19 DIAGNOSIS — R918 Other nonspecific abnormal finding of lung field: Secondary | ICD-10-CM

## 2013-04-19 LAB — BASIC METABOLIC PANEL
BUN: 22 mg/dL (ref 6–23)
CO2: 28 mEq/L (ref 19–32)
Calcium: 8.9 mg/dL (ref 8.4–10.5)
Chloride: 107 mEq/L (ref 96–112)
Creatinine, Ser: 0.85 mg/dL (ref 0.50–1.10)
GFR calc Af Amer: 83 mL/min — ABNORMAL LOW (ref >90)
GFR calc non Af Amer: 72 mL/min — ABNORMAL LOW (ref >90)
Potassium: 3.7 mEq/L (ref 3.5–5.1)

## 2013-04-19 LAB — GLUCOSE, CAPILLARY
Glucose-Capillary: 101 mg/dL — ABNORMAL HIGH (ref 70–99)
Glucose-Capillary: 123 mg/dL — ABNORMAL HIGH (ref 70–99)

## 2013-04-19 MED ORDER — TIOTROPIUM BROMIDE MONOHYDRATE 18 MCG IN CAPS: 18.0000 ug | ORAL_CAPSULE | Freq: Every day | RESPIRATORY_TRACT | Status: DC

## 2013-04-19 MED ORDER — PREDNISONE 20 MG PO TABS: 60.0000 mg | ORAL_TABLET | Freq: Two times a day (BID) | ORAL | Status: DC

## 2013-04-19 MED ORDER — MOXIFLOXACIN HCL 400 MG PO TABS: 400.0000 mg | ORAL_TABLET | Freq: Every day | ORAL | Status: DC

## 2013-04-19 MED ORDER — PANTOPRAZOLE SODIUM 40 MG PO TBEC: 40.0000 mg | DELAYED_RELEASE_TABLET | Freq: Every day | ORAL | Status: DC

## 2013-04-19 MED ORDER — IPRATROPIUM BROMIDE 0.02 % IN SOLN: 0.5000 mg | Freq: Four times a day (QID) | RESPIRATORY_TRACT | Status: DC | PRN

## 2013-04-19 MED ORDER — FLUTICASONE-SALMETEROL 250-50 MCG/DOSE IN AEPB: 1.0000 | INHALATION_SPRAY | Freq: Every day | RESPIRATORY_TRACT | Status: DC

## 2013-04-19 NOTE — Progress Notes (Signed)
Met with Mrs Mccarrell at bedside to explain [facility] Care Management services at bedside. She lives with husband and is supposed to go home with husband. Consents signed at bedside. Explained that [facility] Care Management services will not interfere with home health. Spoke with inpatient RNCM to make aware that [facility] Care Management will follow. Confirmed contact information and explained to patient that she will receive post hospital call and will be evaluated for monthly home visits. Raiford Noble, MSN-Ed, RN,BSN- [facility] [phone]

## 2013-04-19 NOTE — Progress Notes (Signed)
ANTIBIOTIC CONSULT NOTE - Follow Up  Pharmacy Consult for Vancomycin Indication: rule out pneumonia  Allergies  Allergen Reactions  . Penicillins Rash  . Sulfonamide Derivatives Rash  . Tetanus Toxoid Other (See Comments)    Knot on arm    Patient Measurements: Height: 5\' 4"  (162.6 cm) Weight: 209 lb 14.1 oz (95.2 kg) IBW/kg (Calculated) : 54.7  Vital Signs: Temp: 97.8 F (36.6 C) (11/04 0522) Temp src: Oral (11/04 0522) BP: 137/75 mmHg (11/04 0522) Pulse Rate: 66 (11/04 0522)  Labs:  Recent Labs  04/18/13 1040 04/19/13 0355  WBC 9.5  --   HGB 9.1*  --   PLT 145*  --   CREATININE  --  0.85   Estimated Creatinine Clearance: 76.8 ml/min (by C-G formula based on Cr of 0.85).   Assessment: 62 yo F admitted 10/30 with possible recurrent pneumonia.  She presented to outpatient MD office with c/o vomiting, cough, SOB, and fever after recent hospitalization at [facility] for pneumonia from 10/7 - 10/10; IV antibiotics used while inpatient, but discharged on cipro/clinda PO.  Now, concerned for recurrent pneumonia and/or viral process.  Pharmacy is consulted to dose vancomycin and Primaxin.  10/30 >> Vancomycin >> 10/30 >> Primaxin >>  10/30 >> Oseltamivir (MD) >> 11/1  Tmax: AF on 10/31 WBCs: WNL Renal: Stable; SCr 0.85, CrCl 77N, >78CG PCT 0.24 (10/30)  10/30 Blood x2: ngtd 10/30 UA/ Urine: NGF 10/30 sputum cx: normal flora 10/30 Strep pneumo Ur Ag (-) 10/30 Legionella Ur Ag (-) 10/30 Influenza PCR panel - In process 10/31  Dose changes/drug level info:  11/1 1700 VT: 20.9 on 1250 mg q12h, reduced dose to 1g IV q12h  Goal of Therapy:  Vancomycin trough level 15-20 mcg/ml Appropriate abx dosing, eradication of infection.   Plan:  Day #6 vancomycin/imipenem  Continue vancomycin 1gm IV q12h  Plan is likely home on antibiotics, will recheck vancomycin trough Thursday am  Primaxin dose remains appropriate.  Pharmacy will continue to follow.  Juliette Alcide,  PharmD, BCPS.   Pager: [phone] 04/19/2013 8:17 AM

## 2013-04-19 NOTE — Progress Notes (Signed)
All DC instructions reviewed with pt and her husband. All questions and concerns were addressed. Pt dc back home on 02 as preadmission, VSS, NAD, skin intact; no c/o pain. PICC removed before DC by IV team. All belongings sent home with pt and her husband.

## 2013-04-19 NOTE — Discharge Summary (Signed)
Physician Discharge Summary     Patient ID: Jacqueline Washington MRN: [id number] DOB/AGE: 1951/04/05 62 y.o.  Admit date: 04/14/2013 Discharge date: 04/19/2013  Discharge Diagnoses:  Active Problems:   Copd Gold C    Pulmonary nodules   Chronic diastolic heart failure, NYHA class 1   Hypothyroidism   Diabetes mellitus, type II   Morbid obesity   Protein-calorie malnutrition, severe   Rheumatoid arthritis(714.0)   Physical deconditioning   GERD (gastroesophageal reflux disease)   Pneumonitis- recurrent    Chronic respiratory failure   Detailed Hospital Course:   62 yo wf with a history of rheumatoid arthritis, recent pneumonia's x 3, suspected aspiration, who was just discharged from hospital 03-25-13 with recurrent pneumonia. She completed cipro/.clinda 10/24 and prednisone 10/28. She presented 10-30 with chills, white sputum productive, fever 100.5 and vomiting to the [place] office. She was evaluated and sent to [facility] SDU for further evaluation and treatment. We could not rule out recurrent pneumonia and/or a viral process so we treated both. Of note the pt stated that she had improved dramatically w/ steroids, and presented not long after steroids were stopped.   Treatment included: HCAP coverage antibiotics, empiric tamiflu, and systemic steroids. All culture data was negative. Based on the fact that she seemed to have a steroid responsive process additional work-up was ordered. We were concerned about an auto-immune process or perhaps abpa/ active aspergillosis.   Additional lab work included:  IgE: 60.4 RF: 271 ANA: still pending Sed rate: 92 Eosinophil count: 0.0  The above work up was helpful and argued against abpa but did show a remarkably elevated RF. Based on this we felt that this recurrent process may be more rheumatoid lung and not recurrent pneumonia. She continued to improve clinically. Based on the diagnostic evaluation above we felt that she would not need IV  antibiotics. She had met maximum benefit from in-pt care and was ready for d.c as of 11/4 with the plan as outlined above.     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   P:  -oxygen to keep SpO2 > 92%  -change nebs to Perforomist, spiriva, and prn albuterol >> may have difficulty with outpt medications due to limit finances  -continue prednisone at 60mg /d -Day 6/14 abx, home on levaquin, we will see her a couple days after she completes abx to be sure she does not get worse off abx - f/u with rheumatology (Dr. Azzie Roup) 10/13, can be re-evaluated for immunomodulator therapy   Chronic diastolic heart failure.  P:  Resume Home meds  Hx of GERD.  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  P:  Will need re-eval by PCP, has f/u scheduled    ?intermittent vaginal bleeding.  P:  -will need outpt GYN evaluation unless bleeding gets worse      Significant Hospital tests/ studies/ interventions and procedures  SIGNIFICANT EVENTS:  10/30 readmitted  10/31 Swallow evaluation >> Regular diet, thin liquids  STUDIES:  08/12/11 Hypersensitivity panel >> negative  08/12/11 Fungal Ab panel >> Positive Aspergillus fumigatus  09/26/11 PFT >> FEV1 1.23 (54%), FEV1% 48, TLC 4.38 (90%), DLCO 39%, no BD response  10/18/12 Echo >> EF 60 to 65%, grade 1 diastolic dysfx, PAS 43 mmHg  11/17/12 Bronchoscopy >> cytology negative, Cx negative  03/22/13 IgG 677, IgA 107, IgM 71  03/24/13 CT chest >> Biapical  cylindrical BTX, extensive b/l bullous changes, RUL consolidation and smaller LUL infiltrate, small Lt effusion, centrilobular emphysema CULTURES:  10-30 bc x 2>> negative  10-30 uc>> negative  10-30 flu panel>> negative  10/30 Strep UAg>> negative  10/30 Legionella>> negative   ANTIBIOTICS:  10-30 vanc>> 11/4 10-30 primaxin>> 11/4 10-30 tamiflu>> 11/1 11/4  avelox>>>   Discharge Exam: BP 137/75  Pulse 66  Temp(Src) 97.8 F (36.6 C) (Oral)  Resp 20  Ht 5\' 4"  (1.626 m)  Wt 95.2 kg (209 lb 14.1 oz)  BMI 36.01 kg/m2  SpO2 97% 4 liters  PHYSICAL EXAMINATION:  General: No distress  Neuro: normal strength  HEENT: No sinus tenderness  Cardiovascular: regular, no murmur  Lungs: clear  Abdomen: soft, non tender  Musculoskeletal: no edema  Skin: no rashes    Labs at discharge Lab Results  Component Value Date   CREATININE 0.85 04/19/2013   BUN 22 04/19/2013   NA 142 04/19/2013   K 3.7 04/19/2013   CL 107 04/19/2013   CO2 28 04/19/2013   Lab Results  Component Value Date   WBC 9.5 04/18/2013   HGB 9.1* 04/18/2013   HCT 29.6* 04/18/2013   MCV 82.7 04/18/2013   PLT 145* 04/18/2013   Lab Results  Component Value Date   ALT 13 04/14/2013   AST 20 04/14/2013   ALKPHOS 76 04/14/2013   BILITOT 0.5 04/14/2013   Lab Results  Component Value Date   INR 1.17 04/14/2013   CBG (last 3)   Recent Labs  04/16/13 1128 04/16/13 1650 04/19/13 0814  GLUCAP 149* 143* 101*     Current radiology studies Dg Chest 2 View  04/18/2013   CLINICAL DATA:  Followup of pneumonia. Short of breath. History of COPD and asthma. Ex-smoker. Rheumatoid arthritis.  EXAM: CHEST  2 VIEW  COMPARISON:  Plain films of 04/15/2013. CT of 03/24/2013  FINDINGS: A right-sided PICC line which has been placed in interval. This terminates at the cavoatrial junction.  Patient rotated to the right. Normal heart size. No pleural effusion or pneumothorax. Biapical pleural thickening. Suspect minimal improvement in left apical and upper lobe aeration. Persistent bilateral upper lobe airspace opacities with suggestion of hilar retraction and architectural distortion. The lung bases remain clear.  IMPRESSION: Suspect minimal improvement in left upper lobe aeration. Otherwise similar appearance of bilateral upper lobe airspace disease. Given the relatively normal appearance of the  apices on 09/20/2012, favored to represent involving infection. Atypical etiologies should be considered. Recommend radiographic follow-up until clearing.   Electronically Signed   By: Jeronimo Greaves M.D.   On: 04/18/2013 13:40    Disposition:  01-Home or Self Care      Discharge Orders   Future Appointments Provider Department Dept Phone   04/29/2013 11:45 AM Julio Sicks, NP [facility] [phone]   05/03/2013 9:00 AM Quentin Mulling, PA-C [facility] [phone]   05/30/2013 9:00 AM Storm Frisk, MD [facility] [phone]   Future Orders Complete By Expires   Diet - low sodium heart healthy  As directed    For home use only DME oxygen  As directed    Questions:     Mode or (Route):  Nasal cannula   Liters per Minute:     Frequency:     Oxygen conserving device:     Increase activity slowly  As directed        Medication List    STOP taking these medications  beclomethasone 80 MCG/ACT inhaler  Commonly known as:  QVAR      TAKE these medications       albuterol (2.5 MG/3ML) 0.083% nebulizer solution  Commonly known as:  PROVENTIL  Take 2.5 mg by nebulization 4 (four) times daily.     allopurinol 100 MG tablet  Commonly known as:  ZYLOPRIM  Take 100 mg by mouth daily.     cetirizine 10 MG tablet  Commonly known as:  ZYRTEC  Take 10 mg by mouth daily.     fluticasone 50 MCG/ACT nasal spray  Commonly known as:  FLONASE  Place 1 spray into both nostrils daily as needed for allergies.     Fluticasone-Salmeterol 250-50 MCG/DOSE Aepb  Commonly known as:  ADVAIR DISKUS  Inhale 1 puff into the lungs daily.     furosemide 40 MG tablet  Commonly known as:  LASIX  Take 40 mg by mouth daily.     HYDROcodone-acetaminophen 5-325 MG per tablet  Commonly known as:  NORCO/VICODIN  Take 1 tablet by mouth every 6 (six) hours as needed for pain (pain or cough).     ipratropium 0.02 % nebulizer solution    Commonly known as:  ATROVENT  Take 2.5 mLs (0.5 mg total) by nebulization every 6 (six) hours as needed for wheezing or shortness of breath. Dx 496     montelukast 10 MG tablet  Commonly known as:  SINGULAIR  Take 1 tablet by mouth daily.     moxifloxacin 400 MG tablet  Commonly known as:  AVELOX  Take 1 tablet (400 mg total) by mouth daily at 8 pm.     multivitamin capsule  Take 1 capsule by mouth daily.     nystatin cream  Commonly known as:  MYCOSTATIN  Apply 1 application topically daily as needed (under breasts).     pantoprazole 40 MG tablet  Commonly known as:  PROTONIX  Take 1 tablet (40 mg total) by mouth daily.     potassium chloride SA 20 MEQ tablet  Commonly known as:  K-DUR,KLOR-CON  Take 60 mEq by mouth daily.     predniSONE 20 MG tablet  Commonly known as:  DELTASONE  Take 3 tablets (60 mg total) by mouth 2 (two) times daily with a meal.     tiotropium 18 MCG inhalation capsule  Commonly known as:  SPIRIVA HANDIHALER  Place 1 capsule (18 mcg total) into inhaler and inhale daily.     vitamin C 1000 MG tablet  Take 1,000 mg by mouth daily.     Vitamin D-3 5000 UNITS Tabs  Take 5,000 Units by mouth daily.       Follow-up Information   Follow up with Azzie Roup, MD On 04/28/2013. (930am)    Specialty:  Rheumatology   Contact information:   [address], [address] [place] [address] [phone]       Follow up with [facility], NP On 04/29/2013. (1145 am )    Specialty:  Nurse Practitioner   Contact information:   [address] [place] [address] [phone]       Discharged Condition: good  Physician Statement:   The Patient was personally examined, the discharge assessment and plan has been personally reviewed and I agree with ACNP Babcock's assessment and plan. > 30 minutes of time have been dedicated to discharge assessment, planning and discharge instructions.   SignedAnders Simmonds 04/19/2013, 10:18  AM   Coralyn Helling, MD [place] Pulmonary/Critical Care 04/19/2013, 11:05 AM Pager:  [phone] After 3pm call: [phone]

## 2013-04-19 NOTE — Progress Notes (Signed)
Pt does not need IV antibiotics at home. She however can benefit from a [facility] and she is in agreement with this. Service will be provided by [facility]. Referral made.  Algernon Huxley RN, BSN  [phone]

## 2013-04-20 ENCOUNTER — Telehealth: Payer: Self-pay | Admitting: Critical Care Medicine

## 2013-04-20 NOTE — Telephone Encounter (Signed)
I spoke with pt. She was calling to ensure she was to take pred 20 mg 3 tabs BID. I advised her according to d/c note this is correct. She voiced her understanding and needed nothing further.

## 2013-04-21 LAB — CULTURE, BLOOD (ROUTINE X 2): Culture: NO GROWTH

## 2013-04-29 ENCOUNTER — Ambulatory Visit (INDEPENDENT_AMBULATORY_CARE_PROVIDER_SITE_OTHER): Payer: Medicare Other | Admitting: Adult Health

## 2013-04-29 ENCOUNTER — Other Ambulatory Visit (INDEPENDENT_AMBULATORY_CARE_PROVIDER_SITE_OTHER): Payer: Medicare Other

## 2013-04-29 ENCOUNTER — Encounter: Payer: Self-pay | Admitting: Adult Health

## 2013-04-29 ENCOUNTER — Ambulatory Visit (INDEPENDENT_AMBULATORY_CARE_PROVIDER_SITE_OTHER)
Admission: RE | Admit: 2013-04-29 | Discharge: 2013-04-29 | Disposition: A | Discharge status: Home / Self Care | Payer: Medicare Other | Source: Ambulatory Visit | Attending: Adult Health | Admitting: Adult Health

## 2013-04-29 VITALS — BP 124/66 | HR 95 | Temp 98.0°F | Ht 64.0 in | Wt 212.6 lb

## 2013-04-29 DIAGNOSIS — E119 Type 2 diabetes mellitus without complications: Secondary | ICD-10-CM

## 2013-04-29 DIAGNOSIS — J4489 Other specified chronic obstructive pulmonary disease: Secondary | ICD-10-CM

## 2013-04-29 DIAGNOSIS — J449 Chronic obstructive pulmonary disease, unspecified: Secondary | ICD-10-CM

## 2013-04-29 DIAGNOSIS — J189 Pneumonia, unspecified organism: Secondary | ICD-10-CM

## 2013-04-29 LAB — BASIC METABOLIC PANEL
CO2: 29 mEq/L (ref 19–32)
Calcium: 8 mg/dL — ABNORMAL LOW (ref 8.4–10.5)
Creatinine, Ser: 0.7 mg/dL (ref 0.4–1.2)
GFR: 87.09 mL/min (ref >60.00)
Potassium: 3.8 mEq/L (ref 3.5–5.1)
Sodium: 138 mEq/L (ref 135–145)

## 2013-04-29 MED ORDER — PREDNISONE 20 MG PO TABS: ORAL_TABLET | ORAL | Status: DC

## 2013-04-29 NOTE — Progress Notes (Signed)
Subjective:    Patient ID: Jacqueline Washington, female    DOB: 11-10-1950, 62 y.o.   MRN: [id number]  HPI  03/31/2013 Chief Complaint  Patient presents with  . Post Hospital Follow up    Was d/c's from [facility] on 10/10. Breathing has improved since d/c but not back to baseline.  Has left side pain when inhaling.  Prod cough with white to yellow mucus.    Pt in hosp again 03/2013: Detailed Hospital Course:  62 y/o immunocompromised (Leflunomide for RA) with emphysema (Dr. Lynelle Doctor office patient) and recurrent pneumonia admitted with increased dyspnea, subjective fevers and new pulmonary infiltrates. Patient reported having symptoms for one week. She developed progressive worsening of dyspnea as well as her cough became productive for yellow sputum. She had subjective fever but she did not check her temperature. She had increased oxygen requirements since several weeks ago. She was treated with antibiotics and systemic steroids without significant improvement. She reports having an acute admission to the hospital in May of 2014 for pneumonia and spontaneous pneumothorax. She also reports having pneumonia in January of 2014 for which she did not seek medical attention. Overall she describes deterioration of her respiratory status since about 2 years ago. She denies any chest pain or palpitations.She was found to have new left upper lobe airspace disease and still chronic RUL PNA. She was admitted for PNA in immunosuppressed host.  She was admitted initially to the step down unit setting. Therapeutic interventions included: empiric antibiotics, systemic steroids, scheduled bronchodilators and supplemental oxygen. We repeated a CT of chest on 10/9, this showed some improvement in the RUL PNA and verified the new airspace disease on the left. Her respiratory status was slow but progressively improved. On 10/9 we notes she still had significant wheeze. We were concerned that she may not be getting adequate effect  from her advair and spiriva. Because of this we placed her on a trial of albuterol and atrovent. Her symptoms and wheezes seemed to do better with this. She was subjectively 80% baseline respiratory status on time of d/c and was eager to go home. Pt sent out on cipro/cleocin  CT chest RUL chronic pna and LUL pna new, improved over time  Now is better, cough is less, yellow to white. No blood.  Pt in left side worse with a deep breath.  No flutter valve. Has an Incentive spirometer.   04/14/13  Acute OV  Complains of increased SOB, soreness in chest, increased fatigue/sleepiness, prod cough with green/white mucus x4-5days. Pt recently admitted for AECOPD , recurrent PNA .Tx w/ prolonged abx with cipro and cleocin. Says started having worseing breathing for 5 days with increased cough , congesiton with thick green mucus. Vomited this am. Fevers over last 3 days. In office, HR ~125-145 , vomited several times, b/p 94/62. Increased WOB. Sats 86-90% on 2 L. Suppose to be on 3 l/m . Sats improved on 3 l/m at 92%.  Says she has been sleeping a lot over last few days. No energy .  Pt recommended for admission with EMS transport. Pt refused adamantly EMS transport.  She has a hx of RA on Arava- Arava is on hold due to recent PNA. Wt is up 3 lbs over last 2 weeks. Sleeps in recliner every night.  >>admitted   04/29/2013 Post Hospital follow up  HFU - reports breathing is doing well; no new complaints.  finished the Avelox 3 days ago, still taking pred taper at 60mg  BID.  Feels the  best she has felt in long time. Breathing is much better. Decreased dyspnea. No drop in O2 sats on O2.  Was admitted October 30 to November 4 for acute on chronic respiratory failure. 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 Discharged on prednisone 60mg  Twice daily  .  She was seen by Rheumatology yesterday and restarted on Arava.  She denies any  hemoptysis, orthopnea, PND, or leg swelling Chest x-ray today shows improvement in left upper lobe infiltrate.   Past Medical History  Diagnosis Date  . Asthma   . Rheumatoid arthritis(714.0)   . Emphysema     02 dependent 3L/min  . Emphysema   . Pneumonia   . Hypothyroidism   . Diabetes mellitus, type II   . Obesity   . Diastolic heart failure 09/22/2012    Grade 1  . Emphysema      Family History  Problem Relation Age of Onset  . Asthma Mother   . Heart disease Mother   . Clotting disorder Mother   . Colon cancer Neg Hx   . Heart attack Father      History   Social History  . Marital Status: Married    Spouse Name: N/A    Number of Children: 2  . Years of Education: N/A   Occupational History  .     Social History Main Topics  . Smoking status: Former Smoker -- 3.00 packs/day for 43 years    Types: Cigarettes    Quit date: 04/24/2007  . Smokeless tobacco: Never Used     Comment: electronic cigarette daily  . Alcohol Use: Yes     Comment: occasional  . Drug Use: No  . Sexual Activity: No   Other Topics Concern  . Not on file   Social History Narrative  . No narrative on file     Allergies  Allergen Reactions  . Penicillins Rash  . Sulfonamide Derivatives Rash  . Tetanus Toxoid Other (See Comments)    Knot on arm     Outpatient Prescriptions Prior to Visit  Medication Sig Dispense Refill  . albuterol (PROVENTIL) (2.5 MG/3ML) 0.083% nebulizer solution Take 2.5 mg by nebulization 4 (four) times daily.      Marland Kitchen allopurinol (ZYLOPRIM) 100 MG tablet Take 100 mg by mouth daily.      . cetirizine (ZYRTEC) 10 MG tablet Take 10 mg by mouth daily.      . fluticasone (FLONASE) 50 MCG/ACT nasal spray Place 1 spray into both nostrils daily as needed for allergies.      . furosemide (LASIX) 40 MG tablet Take 40 mg by mouth daily.       Marland Kitchen HYDROcodone-acetaminophen (NORCO/VICODIN) 5-325 MG per tablet Take 1 tablet by mouth every 6 (six) hours as needed for pain  (pain or cough).  30 tablet  0  . ipratropium (ATROVENT) 0.02 % nebulizer solution Take 2.5 mLs (0.5 mg total) by nebulization every 6 (six) hours as needed for wheezing or shortness of breath. Dx 496  300 mL  12  . montelukast (SINGULAIR) 10 MG tablet Take 1 tablet by mouth daily.      Marland Kitchen nystatin cream (MYCOSTATIN) Apply 1 application topically daily as needed (under breasts).      . pantoprazole (PROTONIX) 40 MG tablet Take 1 tablet (40 mg total) by mouth daily.  30 tablet  6  . potassium chloride SA (K-DUR,KLOR-CON) 20 MEQ  tablet Take 60 mEq by mouth daily.      . predniSONE (DELTASONE) 20 MG tablet Take 3 tablets (60 mg total) by mouth 2 (two) times daily with a meal.  90 tablet  3  . tiotropium (SPIRIVA HANDIHALER) 18 MCG inhalation capsule Place 1 capsule (18 mcg total) into inhaler and inhale daily.  30 capsule  12  . Ascorbic Acid (VITAMIN C) 1000 MG tablet Take 1,000 mg by mouth daily.      . Cholecalciferol (VITAMIN D-3) 5000 UNITS TABS Take 5,000 Units by mouth daily.      . Multiple Vitamin (MULTIVITAMIN) capsule Take 1 capsule by mouth daily.      . Fluticasone-Salmeterol (ADVAIR DISKUS) 250-50 MCG/DOSE AEPB Inhale 1 puff into the lungs daily.  60 each  6  . moxifloxacin (AVELOX) 400 MG tablet Take 1 tablet (400 mg total) by mouth daily at 8 pm.  7 tablet  0   No facility-administered medications prior to visit.     Review of Systems  Constitutional:   No  weight loss, night sweats,   fevers, chills, fatigue, or  lassitude.  HEENT:   No headaches,  Difficulty swallowing,  Tooth/dental problems, or  Sore throat,                No sneezing, itching, ear ache,  +nasal congestion, post nasal drip,   CV:  No chest pain, swelling in lower extremities,  NO anasarca, dizziness, palpitations, syncope.   GI  No heartburn, indigestion, abdominal pain, nausea, vomiting, diarrhea, change in bowel habits, loss of appetite, bloody stools.   Resp:   No wheezing.  No chest wall  deformity  Skin: no rash or lesions.  GU: no dysuria, change in color of urine, no urgency or frequency.  No flank pain, no hematuria   MS:    No decreased range of motion.  No back pain.  Psych:  No change in mood or affect. No depression or anxiety.  No memory loss.      Objective:   Physical Exam BP 124/66  Pulse 95  Temp(Src) 98 F (36.7 C) (Oral)  Ht 5\' 4"  (1.626 m)  Wt 212 lb 9.6 oz (96.435 kg)  BMI 36.47 kg/m2  SpO2 94%  GEN: A/Ox3; morbidly obese  HEENT:  [place]/AT, , TMs-wnl, NOSE-clear, THROAT-clear, no lesions, no postnasal drip or exudate noted.   NECK:  Supple w/ fair ROM; no JVD; normal carotid impulses w/o bruits; no thyromegaly or nodules palpated; no lymphadenopathy.  RESP: scattered rhonchi , few wheezes .no accessory muscle use, no dullness to percussion, increased WOB    CARD: ST , no m/r/g  , tr-  peripheral edema, pulses intact, no cyanosis or clubbing.  GI:   Soft & nt; nml bowel sounds; no organomegaly or masses detected.  Musco: Warm bil   Neuro: alert, no focal deficits noted.    Skin: Warm, no lesions or rashes  Dg Chest 2 View  04/29/2013   CLINICAL DATA:  Pneumonia.  EXAM: CHEST  2 VIEW  COMPARISON:  04/18/2013.  FINDINGS: Interim removal of PICC line. Continued interval improvement of left upper lobe infiltrate present. Persistent pleural parenchymal thickening in both lung apices present consistent with a component of scarring. There is retraction and right and left upper lobes consistent with scarring. Heart size is stable. No pleural effusion pneumothorax. No acute bony abnormality. Thoracic spine osteopenia and degenerative change.  IMPRESSION: 1. Interim removal PICC line. 2. Continued improvement left upper lobe infiltrate.  3. Prominent bilateral upper lobe pleural parenchymal scarring.   Electronically Signed   By: Maisie Fus  Register   On: 04/29/2013 14:29       Assessment & Plan:   No problem-specific assessment & plan notes found for  this encounter.   Updated Medication List Outpatient Encounter Prescriptions as of 04/29/2013  Medication Sig  . albuterol (PROVENTIL) (2.5 MG/3ML) 0.083% nebulizer solution Take 2.5 mg by nebulization 4 (four) times daily.  Marland Kitchen allopurinol (ZYLOPRIM) 100 MG tablet Take 100 mg by mouth daily.  . cetirizine (ZYRTEC) 10 MG tablet Take 10 mg by mouth daily.  . fluticasone (FLONASE) 50 MCG/ACT nasal spray Place 1 spray into both nostrils daily as needed for allergies.  . Fluticasone-Salmeterol (ADVAIR) 500-50 MCG/DOSE AEPB Inhale 1 puff into the lungs 2 (two) times daily.  . furosemide (LASIX) 40 MG tablet Take 40 mg by mouth daily.   Marland Kitchen HYDROcodone-acetaminophen (NORCO/VICODIN) 5-325 MG per tablet Take 1 tablet by mouth every 6 (six) hours as needed for pain (pain or cough).  Marland Kitchen ipratropium (ATROVENT) 0.02 % nebulizer solution Take 2.5 mLs (0.5 mg total) by nebulization every 6 (six) hours as needed for wheezing or shortness of breath. Dx 496  . leflunomide (ARAVA) 10 MG tablet Take 10 mg by mouth daily.  Marland Kitchen levothyroxine (SYNTHROID, LEVOTHROID) 50 MCG tablet Take 1 tablet by mouth daily.  . montelukast (SINGULAIR) 10 MG tablet Take 1 tablet by mouth daily.  Marland Kitchen nystatin cream (MYCOSTATIN) Apply 1 application topically daily as needed (under breasts).  . pantoprazole (PROTONIX) 40 MG tablet Take 1 tablet (40 mg total) by mouth daily.  . potassium chloride SA (K-DUR,KLOR-CON) 20 MEQ tablet Take 60 mEq by mouth daily.  . predniSONE (DELTASONE) 20 MG tablet Take 3 tablets (60 mg total) by mouth 2 (two) times daily with a meal.  . tiotropium (SPIRIVA HANDIHALER) 18 MCG inhalation capsule Place 1 capsule (18 mcg total) into inhaler and inhale daily.  . Ascorbic Acid (VITAMIN C) 1000 MG tablet Take 1,000 mg by mouth daily.  . Cholecalciferol (VITAMIN D-3) 5000 UNITS TABS Take 5,000 Units by mouth daily.  . Multiple Vitamin (MULTIVITAMIN) capsule Take 1 capsule by mouth daily.  . predniSONE (DELTASONE) 20 MG  tablet Take 2 tabs by mouth twice daily x 1 week, then 1 tab three times daily and hold until seen back in office.  . vitamin B-12 (CYANOCOBALAMIN) 1000 MCG tablet Take 1,000 mcg by mouth daily.  . vitamin E 400 UNIT capsule Take 400 Units by mouth daily.  . [DISCONTINUED] Fluticasone-Salmeterol (ADVAIR DISKUS) 250-50 MCG/DOSE AEPB Inhale 1 puff into the lungs daily.  . [DISCONTINUED] moxifloxacin (AVELOX) 400 MG tablet Take 1 tablet (400 mg total) by mouth daily at 8 pm.

## 2013-04-29 NOTE — Patient Instructions (Addendum)
Taper Prednisone as follows : Prednisone 20mg  2 Twice daily  For 1 week then taper to 20mg  3 tabs daily  (60mg  daily ) and hold at this dose  Continue Advair and Spiriva  I will call with chest xray and lab results.  Follow up with family doctor next week as planned  Follow up Dr. Delford Field  In 2  weeks and As needed   Continue follow up with Dr. Dareen Piano as planned  Please contact office for sooner follow up if symptoms do not improve or worsen or seek emergency care

## 2013-04-29 NOTE — Assessment & Plan Note (Signed)
Patient advised to follow with her primary care physician. Check  BMET  today as she is on high-dose steroids.

## 2013-04-29 NOTE — Assessment & Plan Note (Signed)
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 She is finished her antibiotics, and chest x-ray is improved with decreased left upper lobe infiltrate.   Begin a slow prednisone taper and hold at 60mg  daily until seen back here in 2 weeks   Plan  Taper Prednisone as follows : Prednisone 20mg  2 Twice daily  For 1 week then taper to 20mg  3 tabs daily  (60mg  daily ) and hold at this dose  Continue Advair and Spiriva  I will call with chest xray and lab results.  Follow up with family doctor next week as planned  Follow up Dr. Delford Field  In 2  weeks and As needed   Continue follow up with Dr. Dareen Piano as planned  Please contact office for sooner follow up if symptoms do not improve or worsen or seek emergency care

## 2013-04-29 NOTE — Assessment & Plan Note (Signed)
Reason exacerbation, now resolving. Continue on current regimen.

## 2013-05-02 ENCOUNTER — Encounter: Payer: Self-pay | Admitting: Internal Medicine

## 2013-05-02 DIAGNOSIS — I10 Essential (primary) hypertension: Secondary | ICD-10-CM | POA: Insufficient documentation

## 2013-05-02 DIAGNOSIS — E785 Hyperlipidemia, unspecified: Secondary | ICD-10-CM | POA: Insufficient documentation

## 2013-05-02 DIAGNOSIS — E559 Vitamin D deficiency, unspecified: Secondary | ICD-10-CM | POA: Insufficient documentation

## 2013-05-02 NOTE — Progress Notes (Signed)
Quick Note:  Called spoke with patient, advised of cxr results / recs as stated by TP. Pt verbalized her understanding and denied any questions. ______ 

## 2013-05-02 NOTE — Progress Notes (Signed)
Quick Note:  Called spoke with patient, advised of lab results / recs as stated by TP. Pt verbalized her understanding and denied any questions. ______ 

## 2013-05-03 ENCOUNTER — Ambulatory Visit: Payer: Medicare Other | Admitting: Physician Assistant

## 2013-05-03 ENCOUNTER — Other Ambulatory Visit: Payer: Self-pay | Admitting: Physician Assistant

## 2013-05-03 ENCOUNTER — Encounter: Payer: Self-pay | Admitting: Physician Assistant

## 2013-05-03 ENCOUNTER — Ambulatory Visit (HOSPITAL_COMMUNITY)
Admission: RE | Admit: 2013-05-03 | Discharge: 2013-05-03 | Disposition: A | Discharge status: Home / Self Care | Payer: Medicare Other | Source: Ambulatory Visit | Attending: Physician Assistant | Admitting: Physician Assistant

## 2013-05-03 VITALS — BP 122/68 | HR 84 | Resp 16 | Ht 64.0 in | Wt 215.0 lb

## 2013-05-03 DIAGNOSIS — IMO0002 Reserved for concepts with insufficient information to code with codable children: Secondary | ICD-10-CM | POA: Insufficient documentation

## 2013-05-03 DIAGNOSIS — M545 Low back pain, unspecified: Secondary | ICD-10-CM

## 2013-05-03 DIAGNOSIS — Z79899 Other long term (current) drug therapy: Secondary | ICD-10-CM

## 2013-05-03 DIAGNOSIS — E119 Type 2 diabetes mellitus without complications: Secondary | ICD-10-CM

## 2013-05-03 DIAGNOSIS — K219 Gastro-esophageal reflux disease without esophagitis: Secondary | ICD-10-CM

## 2013-05-03 DIAGNOSIS — B370 Candidal stomatitis: Secondary | ICD-10-CM

## 2013-05-03 DIAGNOSIS — M5380 Other specified dorsopathies, site unspecified: Secondary | ICD-10-CM | POA: Insufficient documentation

## 2013-05-03 DIAGNOSIS — E43 Unspecified severe protein-calorie malnutrition: Secondary | ICD-10-CM

## 2013-05-03 DIAGNOSIS — E039 Hypothyroidism, unspecified: Secondary | ICD-10-CM

## 2013-05-03 DIAGNOSIS — E2839 Other primary ovarian failure: Secondary | ICD-10-CM

## 2013-05-03 DIAGNOSIS — E559 Vitamin D deficiency, unspecified: Secondary | ICD-10-CM

## 2013-05-03 DIAGNOSIS — I672 Cerebral atherosclerosis: Secondary | ICD-10-CM | POA: Insufficient documentation

## 2013-05-03 DIAGNOSIS — I10 Essential (primary) hypertension: Secondary | ICD-10-CM

## 2013-05-03 DIAGNOSIS — E785 Hyperlipidemia, unspecified: Secondary | ICD-10-CM

## 2013-05-03 DIAGNOSIS — E669 Obesity, unspecified: Secondary | ICD-10-CM | POA: Insufficient documentation

## 2013-05-03 DIAGNOSIS — J9610 Chronic respiratory failure, unspecified whether with hypoxia or hypercapnia: Secondary | ICD-10-CM

## 2013-05-03 LAB — LIPID PANEL
Cholesterol: 222 mg/dL — ABNORMAL HIGH (ref 0–200)
HDL: 65 mg/dL (ref >39)
LDL Cholesterol: 136 mg/dL — ABNORMAL HIGH (ref 0–99)
Total CHOL/HDL Ratio: 3.4 Ratio
Triglycerides: 103 mg/dL (ref <150)
VLDL: 21 mg/dL (ref 0–40)

## 2013-05-03 LAB — HEPATIC FUNCTION PANEL
ALT: 34 U/L (ref 0–35)
AST: 15 U/L (ref 0–37)
Bilirubin, Direct: 0.1 mg/dL (ref 0.0–0.3)
Indirect Bilirubin: 0.2 mg/dL (ref 0.0–0.9)
Total Bilirubin: 0.3 mg/dL (ref 0.3–1.2)

## 2013-05-03 LAB — CBC WITH DIFFERENTIAL/PLATELET
Eosinophils Absolute: 0 10*3/uL (ref 0.0–0.7)
HCT: 34.9 % — ABNORMAL LOW (ref 36.0–46.0)
Hemoglobin: 11.1 g/dL — ABNORMAL LOW (ref 12.0–15.0)
Lymphocytes Relative: 2 % — ABNORMAL LOW (ref 12–46)
Lymphs Abs: 0.4 10*3/uL — ABNORMAL LOW (ref 0.7–4.0)
MCH: 25.8 pg — ABNORMAL LOW (ref 26.0–34.0)
Monocytes Relative: 5 % (ref 3–12)
Neutro Abs: 15.6 10*3/uL — ABNORMAL HIGH (ref 1.7–7.7)
Neutrophils Relative %: 93 % — ABNORMAL HIGH (ref 43–77)
Platelets: 142 10*3/uL — ABNORMAL LOW (ref 150–400)
RBC: 4.31 MIL/uL (ref 3.87–5.11)
RDW: 19.1 % — ABNORMAL HIGH (ref 11.5–15.5)
WBC: 16.9 10*3/uL — ABNORMAL HIGH (ref 4.0–10.5)

## 2013-05-03 LAB — BASIC METABOLIC PANEL WITH GFR
CO2: 24 mEq/L (ref 19–32)
Calcium: 8.4 mg/dL (ref 8.4–10.5)
GFR, Est Non African American: >89 mL/min
Glucose, Bld: 119 mg/dL — ABNORMAL HIGH (ref 70–99)
Potassium: 4.1 mEq/L (ref 3.5–5.3)
Sodium: 143 mEq/L (ref 135–145)

## 2013-05-03 LAB — HEMOGLOBIN A1C: Hgb A1c MFr Bld: 6 % — ABNORMAL HIGH (ref <5.7)

## 2013-05-03 LAB — MAGNESIUM: Magnesium: 2.2 mg/dL (ref 1.5–2.5)

## 2013-05-03 LAB — VITAMIN B12: Vitamin B-12: 657 pg/mL (ref 211–911)

## 2013-05-03 MED ORDER — HYDROCODONE-ACETAMINOPHEN 5-325 MG PO TABS: 1.0000 | ORAL_TABLET | Freq: Four times a day (QID) | ORAL | Status: DC | PRN

## 2013-05-03 MED ORDER — NYSTATIN 100000 UNIT/ML MT SUSP: 5.0000 mL | Freq: Four times a day (QID) | OROMUCOSAL | Status: DC

## 2013-05-03 NOTE — Patient Instructions (Signed)
Thrush, Adult  Thrush is a yeast infection that develops in the mouth and throat and on the tongue. The medical term for this is oropharyngeal candidiasis, or OPC. Thrush is most common in older adults, but it can occur at any age. Thrush occurs when a yeast called candida grows out of control. Candida normally is present in small amounts in the mouth and on other mucous membranes. However, under certain circumstances, candida can grow rapidly, causing thrush. Thrush can be a recurring problem for people who have chronic illnesses or who take medications that limit the body's ability to fight infection (weakened immune system). Since these people have difficulty fighting infections, the fungus that causes thrush can spread throughout the body. This can cause life-threatening blood or organ infections. CAUSES  Candida, the yeast that causes thrush, is normally present in small amounts in the mouth and on other mucous membranes. It usually causes no harm. However, when conditions are present that allow the yeast to grow uncontrolled, it invades surrounding tissues and becomes an infection. Thrush is most commonly caused by the yeast Candida albicans. Less often, other forms of candida can lead to thrush. There are many types of bacteria in your mouth that normally control the growth of candida. Sometimes a new type of bacteria gets into your mouth and disrupts the balance of the germs already there. This can allow candida to overgrow. Other factors that increase your risk of developing thrush include:  An impaired ability to fight infection (weakened immune system). A normal immune system is usually strong enough to prevent candida from overgrowing.  Older adults are more likely to develop thrush because they may have weaker immune systems.  People with human immunodeficiency virus (HIV) infection have a high likelihood of developing thrush. About 90% of people with HIV develop thrush at some point during  the course of their disease.  People with diabetes are more likely to get thrush because high blood sugar levels promote overgrowth of the candida fungus.  A dry mouth (xerostomia). Dry mouth can result from overuse of mouthwashes or from certain conditions such as Sjgren's syndrome.  Pregnancy. Hormone changes during pregnancy can lead to thrush by altering the balance of bacteria in the mouth.  Poor dental care, especially in people who have false teeth.  The use of antibiotic medications. This may lead to thrush by changing the balance of bacteria in the mouth. SYMPTOMS  Thrush can be a mild infection that causes no symptoms. If symptoms develop, they may include the following:  A burning feeling in the mouth and throat. This can occur at the start of a thrush infection.  White patches that adhere to the mouth and tongue. The tissue around the patches may be red, raw, and painful. If rubbed (during tooth brushing, for example), the patches and the tissue of the mouth may bleed easily.  A bad taste in the mouth or difficulty tasting foods.  Cottony feeling in the mouth.  Sometimes pain during eating and swallowing. DIAGNOSIS  Your caregiver can usually diagnose thrush by exam. In addition to looking in your mouth, your caregiver will ask you questions about your health. TREATMENT  Medications that help prevent the growth of fungi (antifungals) are the standard treatment for thrush. These medications are either applied directly to the affected area (topical) or swallowed (oral). Mild thrush In adults, mild cases of thrush may clear up with simple treatment that can be done at home. This treatment usually involves using an antifungal mouth   rinse or lozenges. Treatment usually lasts about 14 days. Moderate to severe thrush  More severe thrush infections that have spread to the esophagus are treated with an oral antifungal medication. A topical antifungal medication may also be  used.  For some severe infections, a treatment period longer than 14 days may be needed.  Oral antifungal medications are almost never used during pregnancy because the fetus may be harmed. However, if a pregnant woman has a rare, severe thrush infection that has spread to her blood, oral antifungal medications may be used. In this case, the risk of harm to the mother and fetus from the severe thrush infection may be greater than the risk posed by the use of antifungal medications. Persistent or recurrent thrush Persistent (does not go away) or recurrent (keeps coming back) cases of thrush may:  Need to be treated twice as long as the symptoms last.  Require treatment with both oral and topical antifungal medications.  People with weakened immune systems can take an antifungal medication on a continuous basis to prevent thrush infections. It is important to treat conditions that make you more likely to get thrush, such as diabetes, human immunodeficiency virus (HIV), or cancer.  HOME CARE INSTRUCTIONS   If you are breast-feeding, you should clean your nipples with an antifungal medication, such as nystatin (Mycostatin). Dry your nipples after breast-feeding. Applying lanolin-containing body lotion may help relieve nipple soreness.  If you wear dentures and get thrush, remove dentures before going to bed, brush them vigorously, and soak in a solution of chlorhexidine gluconate or a product such as Polident or Efferdent.  Eating plain, unflavored yogurt that contains live cultures (check the label) can also help cure thrush. Yogurt helps healthy bacteria grow in the mouth. These bacteria stop the growth of the yeast that causes thrush.  Adults can treat thrush at home with gentian violet (1%), a dye that kills bacteria and fungi. It is available without a prescription. If there is no known cause for the infection or if gentian violet does not cure the thrush, you need to see your  caregiver. Comfort measures Measures can be taken to reduce the discomfort of thrush:  Drink cold liquids such as water or iced tea. Eat flavored ice treats or frozen juices.  Eat foods that are easy to swallow such as gelatin, ice cream, or custard.  If the patches are painful, try drinking from a straw.  Rinse your mouth several times a day with a warm saltwater rinse. You can make the saltwater mixture with 1 tsp (5 g) of salt in 8 fl oz (0.2 L) of warm water. PROGNOSIS   Most cases of thrush are mild and clear up with the use of an antifungal mouth rinse or lozenges. Very mild cases of thrush may clear up without medical treatment. It usually takes about 14 days of treatment with an oral antifungal medication to cure more severe thrush infections. In some cases, thrush may last several weeks even with treatment.  If thrush goes untreated and does not go away by itself, it can spread to other parts of the body.  Thrush can spread to the throat, the vagina, or the skin. It rarely spreads to other organs of the body. Thrush is more likely to recur (come back) in:  People who use inhaled corticosteroids to treat asthma.  People who take antibiotic medications for a long time.  People who have false teeth.  People who have a weakened immune system. RISKS AND   COMPLICATIONS Complications related to thrush are rare in healthy people. There are several factors that can increase your risk of developing thrush. Age Older adults, especially those who have serious health problems, are more likely to develop thrush because their immune systems are likely to be weaker. Behavior  The yeast that causes thrush can be spread by oral sex.  Heavy smoking can lower the body's ability to fight off infections. This makes thrush more likely to develop. Other conditions  False teeth (dentures), braces, or a retainer that irritates the mouth make it hard to keep the mouth clean. An unclean mouth is  more likely to develop thrush than a clean mouth.  People with a weakened immune system, such as those who have diabetes or human immunodeficiency virus (HIV) or who are undergoing chemotherapy, have an increased risk for developing thrush. Medications Some medications can allow the fungus that causes thrush to grow uncontrolled. Common ones are:  Antibiotics, especially those that kill a wide range of organisms (broad-spectrum antibiotics), such as tetracycline commonly can cause thrush.  Birth control pills (oral contraceptives).  Medications that weaken the body's immune system, such as corticosteroids. Environment Exposure over time to certain environmental chemicals, such as benzene and pesticides, can weaken the body's immune system. This increases your risk for developing infections, including thrush. SEEK IMMEDIATE MEDICAL CARE IF:  Your symptoms are getting worse or are not improving within 7 days of starting treatment.  You have symptoms of spreading infection, such as white patches on the skin outside of the mouth.  You are nursing and you have redness and pain in the nipples in spite of home treatment or if you have burning pain in the nipple area when you nurse. Your baby's mouth should also be examined to determine whether thrush is causing your symptoms. Document Released: 02/26/2004 Document Revised: 08/25/2011 Document Reviewed: 01/03/2013 [employer] Patient Information 2014 [employer].  

## 2013-05-03 NOTE — Progress Notes (Signed)
Complete Physical HPI Patient presents for complete physical.   Patient's blood pressure has been controlled at home. Patient denies chest pain, shortness of breath, dizziness.States she is having edema again.  Patient's cholesterol is diet controlled.  The patient's cholesterol last visit was 94.  The patient has been working on diet and exercise for prediabetes, denies changes in vision, polys, and paresthesias. Last A1C in office was 5.4 with an elevated insulin. Will recheck since on the prednisone taper.  Patient has been in the hospital twice with pneumonitis/RA lung. She has followed up with her Pulmonologist and is currently on Prednisone Taper and states she is not back on baseline.  Dr. Dareen Piano also started her back on her RA medication secondary to her lungs. Patient complains of waking up several times in the middle of the night, sleeps in a recliner since march. Denies shortness of breath. Patient has been on 3 L, which she put herself on her own. Oxygen in the office is currently 98%. Patient would like a sleeping for at night. She has been taking Norco for lower back pain and she states that this helps her with sleep and her back. She had 30 on 03/25/13 and still has more.  LBP more so on the left side for several months, denies GU sx's, Denies radiation down legs but states right leg is worse than left leg for weakness.   Current Medications:  Current Outpatient Prescriptions on File Prior to Visit  Medication Sig Dispense Refill  . albuterol (PROVENTIL) (2.5 MG/3ML) 0.083% nebulizer solution Take 2.5 mg by nebulization 4 (four) times daily.      Marland Kitchen allopurinol (ZYLOPRIM) 100 MG tablet Take 100 mg by mouth daily.      . Ascorbic Acid (VITAMIN C) 1000 MG tablet Take 1,000 mg by mouth daily.      . cetirizine (ZYRTEC) 10 MG tablet Take 10 mg by mouth daily.      . Cholecalciferol (VITAMIN D-3) 5000 UNITS TABS Take 5,000 Units by mouth daily.      . fluticasone (FLONASE) 50 MCG/ACT  nasal spray Place 1 spray into both nostrils daily as needed for allergies.      . Fluticasone-Salmeterol (ADVAIR) 500-50 MCG/DOSE AEPB Inhale 1 puff into the lungs 2 (two) times daily.      . furosemide (LASIX) 40 MG tablet Take 40 mg by mouth daily.       Marland Kitchen HYDROcodone-acetaminophen (NORCO/VICODIN) 5-325 MG per tablet Take 1 tablet by mouth every 6 (six) hours as needed for pain (pain or cough).  30 tablet  0  . ipratropium (ATROVENT) 0.02 % nebulizer solution Take 2.5 mLs (0.5 mg total) by nebulization every 6 (six) hours as needed for wheezing or shortness of breath. Dx 496  300 mL  12  . leflunomide (ARAVA) 10 MG tablet Take 10 mg by mouth daily.      Marland Kitchen levothyroxine (SYNTHROID, LEVOTHROID) 50 MCG tablet Take 1 tablet by mouth daily.      . montelukast (SINGULAIR) 10 MG tablet Take 1 tablet by mouth daily.      . Multiple Vitamin (MULTIVITAMIN) capsule Take 1 capsule by mouth daily.      Marland Kitchen nystatin cream (MYCOSTATIN) Apply 1 application topically daily as needed (under breasts).      . pantoprazole (PROTONIX) 40 MG tablet Take 1 tablet (40 mg total) by mouth daily.  30 tablet  6  . potassium chloride SA (K-DUR,KLOR-CON) 20 MEQ tablet Take 60 mEq by mouth daily.      Marland Kitchen  predniSONE (DELTASONE) 20 MG tablet Take 3 tablets (60 mg total) by mouth 2 (two) times daily with a meal.  90 tablet  3  . predniSONE (DELTASONE) 20 MG tablet Take 2 tabs by mouth twice daily x 1 week, then 1 tab three times daily and hold until seen back in office.  100 tablet  0  . tiotropium (SPIRIVA HANDIHALER) 18 MCG inhalation capsule Place 1 capsule (18 mcg total) into inhaler and inhale daily.  30 capsule  12  . vitamin B-12 (CYANOCOBALAMIN) 1000 MCG tablet Take 1,000 mcg by mouth daily.      . vitamin E 400 UNIT capsule Take 400 Units by mouth daily.       No current facility-administered medications on file prior to visit.   Health Maintenance:   Tetanus:- Allergy Pneumovax: 2013 Flu vaccine:Oct, 16  2014 Zostavax: patient request it Pap: 2009 MGM:04/15/2012 due DEXA: very high risk and suggest patient getting Colonoscopy: 2013 with Dr. Christella Hartigan due in 5 years.  EGD: N/A  Allergies:  Allergies  Allergen Reactions  . Chantix [Varenicline]   . Hctz [Hydrochlorothiazide]   . Penicillins Rash  . Sulfonamide Derivatives Rash  . Tetanus Toxoid Other (See Comments)    Knot on arm   Medical History:  Past Medical History  Diagnosis Date  . Asthma   . Emphysema     02 dependent 3L/min  . Emphysema   . Pneumonia   . Hypothyroidism   . Diabetes mellitus, type II   . Obesity   . Diastolic heart failure 09/22/2012    Grade 1  . Emphysema   . Rheumatoid arthritis(714.0)   . Hyperlipidemia   . Hypertension   . Vitamin D deficiency    Surgical History:  Past Surgical History  Procedure Laterality Date  . Tubal ligation    . Colonoscopy    . Video bronchoscopy N/A 11/18/2012    Procedure: VIDEO BRONCHOSCOPY WITH FLUORO;  Surgeon: Storm Frisk, MD;  Location: [facility];  Service: Cardiopulmonary;  Laterality: N/A;  . Eye surgery  at age 9   Family History:  Family History  Problem Relation Age of Onset  . Asthma Mother   . Heart disease Mother   . Clotting disorder Mother   . Heart attack Mother   . Diabetes Mother   . Hypertension Mother   . Cirrhosis Mother   . Colon cancer Neg Hx   . Heart attack Father    Social History:  History   Social History  . Marital Status: Married    Spouse Name: N/A    Number of Children: 2  . Years of Education: N/A   Occupational History  .     Social History Main Topics  . Smoking status: Former Smoker -- 3.00 packs/day for 43 years    Types: Cigarettes    Quit date: 04/24/2007  . Smokeless tobacco: Never Used     Comment: electronic cigarette daily  . Alcohol Use: Yes     Comment: occasional  . Drug Use: No  . Sexual Activity: No   Other Topics Concern  . Not on file   Social History Narrative  . No narrative  on file   ROS Constitutional: Denies weight loss/gain, headaches, insomnia, fatigue, night sweats, and change in appetite. Eyes: Denies redness, blurred vision, diplopia, discharge, itchy, watery eyes.  ENT: Denies discharge, congestion, post nasal drip, sore throat, earache, hearing loss, dental pain, Tinnitus, Vertigo, Sinus pain, snoring.  Cardio: Denies chest pain, palpitations,  irregular heartbeat, dyspnea, diaphoresis, orthopnea, PND, claudication, edema Respiratory: + dyspnea and SOB. denies cough, dyspnea, pleurisy, hoarseness, wheezing.  Gastrointestinal: Denies dysphagia, heartburn, pain, cramps, nausea, vomiting, bloating, diarrhea, constipation, hematemesis, melena, hematochezia, hemorrhoids Genitourinary: Denies dysuria, frequency, urgency, nocturia, hesitancy, discharge, hematuria, flank pain Breast:Breast lumps, nipple discharge, bleeding.  Musculoskeletal: + LBP with leg weakness for 1-2 months, no bowel or bladder, denies radiation/paresthesias. Denies arthralgia, myalgia, stiffness, Jt. Swelling, pain, Skin: Denies pruritis, rash, hives,  acne, eczema, changing in skin lesion Neuro: + bilateral leg weakness denies, tremor, incoordination, spasms, paresthesia, pain Psychiatric: Denies confusion, memory loss, sensory loss Endocrine: Denies change in weight, skin, hair change, nocturia, and paresthesia, Diabetic Polys, visual blurring, hyper /hypo glycemic episodes.  Heme/Lymph: Excessive bleeding, bruising, enlarged lymph nodes  Physical Exam: Estimated body mass index is 36.89 kg/(m^2) as calculated from the following:   Height as of this encounter: 5\' 4"  (1.626 m).   Weight as of this encounter: 215 lb (97.523 kg). Filed Vitals:   05/03/13 0910  BP: 122/68  Pulse: 84  Resp: 16   General Appearance: Well nourished, cushingoid appearance.  Eyes: PERRLA, EOMs, conjunctiva no swelling or erythema, normal fundi and vessels. Sinuses: No Frontal/maxillary  tenderness ENT/Mouth: Ext aud canals clear, normal light reflex with TMs without erythema, bulging.  Good dentition. + white plaques on post pharynx No erythema, swelling on post pharynx. Tonsils not swollen or erythematous. Hearing normal.  Neck: Supple, thyroid normal. No bruits Respiratory: + tachypnea with decreased breath sounds bilaterally and mild expiratory wheeze anterior arms. BS equal bilaterally without rales, rhonchi, wheezing or stridor. Cardio: Heart sounds normal, regular rate and rhythm without murmurs, rubs or gallops. Peripheral pulses brisk and equal bilaterally, + 3-4 edema bilaterally.  Chest: symmetric, with normal excursions and percussion. Breasts: defer Abdomen: obese, soft, with bowl sounds. Non tender, no guarding, rebound, hernias, masses, or organomegaly. .  Lymphatics: Non tender without lymphadenopathy.  Genitourinary: defer Musculoskeletal: diffuse decreased strength, patient is in wheel chair.  Skin: Warm, dry without rashes, lesions, ecchymosis.  Neuro: Cranial nerves intact, reflexes equal bilaterally but decreased in bilateral legs. Normal muscle tone, no cerebellar symptoms.  Psych: Awake and oriented X 3, normal affect, Insight and Judgment appropriate.   EKG: Had done recently in the hosptial  Assessment and Plan:  Asthma- continue inhalers.   Emphysema O2 Dependent 3L/min- continue to follow up with Dr. Delford Field  Hypothyroidism- check TSH  Diabetes mellitus, type II- counseled on diet and exercise check A1C  Obesity- counseled on diet and exercise.   Diastolic heart failure- Grade 1- continue to control BP  Rheumatoid arthritis(714.0)- follows up with Dr. Dareen Piano  Hyperlipidemia- continue diet and exercise and check cholesterol  Hypertension- continue to monitor  Vitamin D deficiency- continue med  Canidiasis, mouth- Nystatin mouth wash Lower back pain- norco 5/325 #90- and get lumbar Xray Suggest getting Zosta vax, get MGM, get DEXA secondary to  increased risk  Quentin Mulling 9:13 AM

## 2013-05-04 LAB — URINALYSIS, MICROSCOPIC ONLY: Crystals: NONE SEEN

## 2013-05-04 LAB — URINALYSIS, ROUTINE W REFLEX MICROSCOPIC
Bilirubin Urine: NEGATIVE
Glucose, UA: NEGATIVE mg/dL
Hgb urine dipstick: NEGATIVE
pH: 6.5 (ref 5.0–8.0)

## 2013-05-04 LAB — MICROALBUMIN / CREATININE URINE RATIO
Creatinine, Urine: 22.5 mg/dL
Microalb Creat Ratio: 48.9 mg/g — ABNORMAL HIGH (ref 0.0–30.0)
Microalb, Ur: 1.1 mg/dL (ref 0.00–1.89)

## 2013-05-04 LAB — VITAMIN D 25 HYDROXY (VIT D DEFICIENCY, FRACTURES): Vit D, 25-Hydroxy: 40 ng/mL (ref 30–89)

## 2013-05-06 ENCOUNTER — Telehealth: Payer: Self-pay | Admitting: Critical Care Medicine

## 2013-05-06 LAB — URINE CULTURE: Colony Count: >=100000

## 2013-05-06 MED ORDER — NITROFURANTOIN MONOHYD MACRO 100 MG PO CAPS: 100.0000 mg | ORAL_CAPSULE | Freq: Two times a day (BID) | ORAL | Status: AC

## 2013-05-06 NOTE — Addendum Note (Signed)
Addended by: Quentin Mulling R on: 05/06/2013 11:23 AM   Modules accepted: Orders

## 2013-05-06 NOTE — Telephone Encounter (Signed)
I spoke with pt. She picked up RX from last OV today. She had already done 80 mg daily of pred and wants to make sure she is to continue at 60 mg daily. I advised her this is correct. Nothing further needed

## 2013-05-13 ENCOUNTER — Ambulatory Visit (INDEPENDENT_AMBULATORY_CARE_PROVIDER_SITE_OTHER): Payer: Medicare Other | Admitting: Critical Care Medicine

## 2013-05-13 ENCOUNTER — Encounter: Payer: Self-pay | Admitting: Critical Care Medicine

## 2013-05-13 ENCOUNTER — Telehealth: Payer: Self-pay | Admitting: Critical Care Medicine

## 2013-05-13 VITALS — BP 108/68 | HR 148 | Temp 98.3°F | Ht 64.0 in | Wt 218.2 lb

## 2013-05-13 DIAGNOSIS — J019 Acute sinusitis, unspecified: Secondary | ICD-10-CM

## 2013-05-13 DIAGNOSIS — J449 Chronic obstructive pulmonary disease, unspecified: Secondary | ICD-10-CM

## 2013-05-13 DIAGNOSIS — J189 Pneumonia, unspecified organism: Secondary | ICD-10-CM

## 2013-05-13 MED ORDER — LEVOFLOXACIN 500 MG PO TABS: 500.0000 mg | ORAL_TABLET | Freq: Every day | ORAL | Status: DC

## 2013-05-13 MED ORDER — FLUTICASONE PROPIONATE 50 MCG/ACT NA SUSP: 2.0000 | Freq: Every day | NASAL | Status: DC

## 2013-05-13 MED ORDER — MOXIFLOXACIN HCL 400 MG PO TABS: 400.0000 mg | ORAL_TABLET | Freq: Every day | ORAL | Status: DC

## 2013-05-13 MED ORDER — PREDNISONE 20 MG PO TABS: ORAL_TABLET | ORAL | Status: DC

## 2013-05-13 NOTE — Telephone Encounter (Signed)
Change has been made in pt med list. Levaquin 500 x 10d called into pharmacy.  Pt aware.

## 2013-05-13 NOTE — Patient Instructions (Signed)
Start avelox one daily  Reduce prednisone to 50mg  a day for 5 days then 40mg  daily Use neil med sinus rinse twice daily Use flonase daily two puff ea nostril No other medication changes Return 1 month

## 2013-05-13 NOTE — Assessment & Plan Note (Signed)
Asthmatic bronchitis with COPD components and associated chronic respiratory failure Plan Maintain oxygen and bronchodilator therapy as prescribed

## 2013-05-13 NOTE — Telephone Encounter (Signed)
Can try levaquin 500mg  /d x 10days,  generic

## 2013-05-13 NOTE — Assessment & Plan Note (Signed)
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

## 2013-05-13 NOTE — Telephone Encounter (Signed)
Pt states Avelox is too expensive and is requesting something cheaper, possibly a generic? Please advise.Jacqueline Washington, CMA Allergies  Allergen Reactions  . Chantix [Varenicline]   . Hctz [Hydrochlorothiazide]   . Penicillins Rash  . Sulfonamide Derivatives Rash  . Tetanus Toxoid Other (See Comments)    Knot on arm

## 2013-05-13 NOTE — Progress Notes (Signed)
Subjective:    Patient ID: Jacqueline Washington, female    DOB: [birth year], 62 y.o.   MRN: [id number]  HPI  04/29/13  Post Hospital follow up  HFU - reports breathing is doing well; no new complaints.  finished the Avelox 3 days ago, still taking pred taper at 60mg  BID.  Feels the best she has felt in long time. Breathing is much better. Decreased dyspnea. No drop in O2 sats on O2.  Was admitted October 30 to November 4 for acute on chronic respiratory failure. 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 Discharged on prednisone 60mg  Twice daily  .  She was seen by Rheumatology yesterday and restarted on Arava.  She denies any hemoptysis, orthopnea, PND, or leg swelling Chest x-ray today shows improvement in left upper lobe infiltrate.  05/13/2013 Chief Complaint  Patient presents with  . Follow-up    pt states x 1 week she has been having an increase productive cough with yellow phlegm, increased SOB,  and chest tightness x 1 week.   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 She is finished her antibiotics, and chest x-ray is improved with decreased left upper lobe infiltrate. Begin a slow prednisone taper and hold at 60mg  daily until seen back here in 2 weeks  Plan  Taper Prednisone as follows : Prednisone 20mg  2 Twice daily For 1 week then taper to 20mg  3 tabs daily  (60mg  daily ) and hold at this dose  Continue Advair and Spiriva   Now on 60mg  /d.  Now is worse with breathing.  Was in lungs, not in middle of chest.  Swallow eval was normal Mucus now is yellow, no blood.  No real chest pain. Notes some mucus out of nose, notes some pndrip. Notes some ant sinus pressure.  MOre dyspnea at rest. No f/c/s Rheum MD Azzie Roup.     Past Medical History  Diagnosis Date  . Asthma   . Emphysema     02 dependent 3L/min  . Emphysema   .  Pneumonia   . Hypothyroidism   . Diabetes mellitus, type II   . Obesity   . Diastolic heart failure 09/22/2012    Grade 1  . Emphysema   . Rheumatoid arthritis(714.0)   . Hyperlipidemia   . Hypertension   . Vitamin D deficiency      Family History  Problem Relation Age of Onset  . Asthma Mother   . Heart disease Mother   . Clotting disorder Mother   . Heart attack Mother   . Diabetes Mother   . Hypertension Mother   . Cirrhosis Mother   . Colon cancer Neg Hx   . Heart attack Father      History   Social History  . Marital Status: Married    Spouse Name: N/A    Number of Children: 2  . Years of Education: N/A   Occupational History  .     Social History Main Topics  . Smoking status: Former Smoker -- 3.00 packs/day for 43 years    Types: Cigarettes    Quit date: 04/24/2007  . Smokeless tobacco: Never Used     Comment: electronic cigarette daily  . Alcohol Use: Yes     Comment: occasional  . Drug Use: No  . Sexual Activity: No   Other Topics  Concern  . Not on file   Social History Narrative  . No narrative on file     Allergies  Allergen Reactions  . Chantix [Varenicline]   . Hctz [Hydrochlorothiazide]   . Penicillins Rash  . Sulfonamide Derivatives Rash  . Tetanus Toxoid Other (See Comments)    Knot on arm     Outpatient Prescriptions Prior to Visit  Medication Sig Dispense Refill  . albuterol (PROVENTIL) (2.5 MG/3ML) 0.083% nebulizer solution Take 2.5 mg by nebulization 4 (four) times daily.      Marland Kitchen allopurinol (ZYLOPRIM) 100 MG tablet Take 100 mg by mouth daily.      . cetirizine (ZYRTEC) 10 MG tablet Take 10 mg by mouth daily.      . Fluticasone-Salmeterol (ADVAIR) 500-50 MCG/DOSE AEPB Inhale 1 puff into the lungs 2 (two) times daily.      . furosemide (LASIX) 40 MG tablet Take 40 mg by mouth daily.       Marland Kitchen HYDROcodone-acetaminophen (NORCO/VICODIN) 5-325 MG per tablet Take 1 tablet by mouth every 6 (six) hours as needed.  90 tablet  0  .  ipratropium (ATROVENT) 0.02 % nebulizer solution Take 2.5 mLs (0.5 mg total) by nebulization every 6 (six) hours as needed for wheezing or shortness of breath. Dx 496  300 mL  12  . leflunomide (ARAVA) 10 MG tablet Take 10 mg by mouth daily.      Marland Kitchen levothyroxine (SYNTHROID, LEVOTHROID) 50 MCG tablet Take 1 tablet by mouth daily.      . montelukast (SINGULAIR) 10 MG tablet Take 1 tablet by mouth daily.      . nitrofurantoin, macrocrystal-monohydrate, (MACROBID) 100 MG capsule Take 1 capsule (100 mg total) by mouth 2 (two) times daily.  20 capsule  0  . nystatin (MYCOSTATIN) 100000 UNIT/ML suspension Take 5 mLs (500,000 Units total) by mouth 4 (four) times daily.  120 mL  2  . nystatin cream (MYCOSTATIN) Apply 1 application topically daily as needed (under breasts).      . pantoprazole (PROTONIX) 40 MG tablet Take 1 tablet (40 mg total) by mouth daily.  30 tablet  6  . potassium chloride SA (K-DUR,KLOR-CON) 20 MEQ tablet Take 60 mEq by mouth daily.      Marland Kitchen tiotropium (SPIRIVA HANDIHALER) 18 MCG inhalation capsule Place 1 capsule (18 mcg total) into inhaler and inhale daily.  30 capsule  12  . predniSONE (DELTASONE) 20 MG tablet Take 3 tablets (60 mg total) by mouth 2 (two) times daily with a meal.  90 tablet  3  . Ascorbic Acid (VITAMIN C) 1000 MG tablet Take 1,000 mg by mouth daily.      . Cholecalciferol (VITAMIN D-3) 5000 UNITS TABS Take 5,000 Units by mouth daily.      . Multiple Vitamin (MULTIVITAMIN) capsule Take 1 capsule by mouth daily.      . vitamin B-12 (CYANOCOBALAMIN) 1000 MCG tablet Take 1,000 mcg by mouth daily.      . vitamin E 400 UNIT capsule Take 400 Units by mouth daily.      . fluticasone (FLONASE) 50 MCG/ACT nasal spray Place 1 spray into both nostrils daily as needed for allergies.      . predniSONE (DELTASONE) 20 MG tablet Take 2 tabs by mouth twice daily x 1 week, then 1 tab three times daily and hold until seen back in office.  100 tablet  0   No facility-administered  medications prior to visit.     Review of Systems  Constitutional:   No  weight loss, night sweats,   fevers, chills, fatigue, or  lassitude.  HEENT:   No headaches,  Difficulty swallowing,  Tooth/dental problems, or  Sore throat,                No sneezing, itching, ear ache,  +nasal congestion, post nasal drip,   CV:  No chest pain, swelling in lower extremities,  NO anasarca, dizziness, palpitations, syncope.   GI  No heartburn, indigestion, abdominal pain, nausea, vomiting, diarrhea, change in bowel habits, loss of appetite, bloody stools.   Resp:   No wheezing.  No chest wall deformity  Skin: no rash or lesions.  GU: no dysuria, change in color of urine, no urgency or frequency.  No flank pain, no hematuria   MS:    No decreased range of motion.  No back pain.  Psych:  No change in mood or affect. No depression or anxiety.  No memory loss.      Objective:   Physical Exam BP 108/68  Pulse 148  Temp(Src) 98.3 F (36.8 C) (Oral)  Ht 5\' 4"  (1.626 m)  Wt 218 lb 3.2 oz (98.975 kg)  BMI 37.44 kg/m2  SpO2 92%  GEN: A/Ox3; morbidly obese  HEENT:  [place]/AT, , TMs-wnl, NOSE-clear, THROAT-clear, no lesions, no postnasal drip or exudate noted.   NECK:  Supple w/ fair ROM; no JVD; normal carotid impulses w/o bruits; no thyromegaly or nodules palpated; no lymphadenopathy.  RESP: scattered rhonchi , few wheezes .no accessory muscle use, no dullness to percussion, increased WOB    CARD: ST , no m/r/g  , tr-  peripheral edema, pulses intact, no cyanosis or clubbing.  GI:   Soft & nt; nml bowel sounds; no organomegaly or masses detected.  Musco: Warm bil   Neuro: alert, no focal deficits noted.    Skin: Warm, no lesions or rashes  No results found.     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 Now with recurrent flare due to acute sinusitis Plan Taper prednisone from  60 mg a day slowly down to 40 mg daily and hold  Copd Gold C  Asthmatic bronchitis with COPD components and associated chronic respiratory failure Plan Maintain oxygen and bronchodilator therapy as prescribed  Sinusitis, acute Acute on chronic sinusitis Plan Start avelox one daily  Reduce prednisone to 50mg  a day for 5 days then 40mg  daily Use neil med sinus rinse twice daily Use flonase daily two puff ea nostril No other medication changes Return 1 month     Updated Medication List Outpatient Encounter Prescriptions as of 05/13/2013  Medication Sig  . albuterol (PROVENTIL) (2.5 MG/3ML) 0.083% nebulizer solution Take 2.5 mg by nebulization 4 (four) times daily.  Marland Kitchen allopurinol (ZYLOPRIM) 100 MG tablet Take 100 mg by mouth daily.  . cetirizine (ZYRTEC) 10 MG tablet Take 10 mg by mouth daily.  . Fluticasone-Salmeterol (ADVAIR) 500-50 MCG/DOSE AEPB Inhale 1 puff into the lungs 2 (two) times daily.  . furosemide (LASIX) 40 MG tablet Take 40 mg by mouth daily.   Marland Kitchen HYDROcodone-acetaminophen (NORCO/VICODIN) 5-325 MG per tablet Take 1 tablet by mouth every 6 (six) hours as needed.  Marland Kitchen ipratropium (ATROVENT) 0.02 % nebulizer solution Take 2.5 mLs (0.5 mg total) by nebulization every 6 (six) hours as needed for wheezing or shortness of breath. Dx 496  . leflunomide (ARAVA) 10 MG tablet Take 10 mg by  mouth daily.  Marland Kitchen levothyroxine (SYNTHROID, LEVOTHROID) 50 MCG tablet Take 1 tablet by mouth daily.  . montelukast (SINGULAIR) 10 MG tablet Take 1 tablet by mouth daily.  . nitrofurantoin, macrocrystal-monohydrate, (MACROBID) 100 MG capsule Take 1 capsule (100 mg total) by mouth 2 (two) times daily.  Marland Kitchen nystatin (MYCOSTATIN) 100000 UNIT/ML suspension Take 5 mLs (500,000 Units total) by mouth 4 (four) times daily.  Marland Kitchen nystatin cream (MYCOSTATIN) Apply 1 application topically daily as needed (under breasts).  . pantoprazole (PROTONIX) 40 MG tablet Take 1 tablet (40 mg total) by mouth daily.  .  potassium chloride SA (K-DUR,KLOR-CON) 20 MEQ tablet Take 60 mEq by mouth daily.  . predniSONE (DELTASONE) 20 MG tablet Reduce to 2 1/2 tab daily for 5 days then 2 daily and STAY  . tiotropium (SPIRIVA HANDIHALER) 18 MCG inhalation capsule Place 1 capsule (18 mcg total) into inhaler and inhale daily.  . [DISCONTINUED] predniSONE (DELTASONE) 20 MG tablet Take 3 tablets (60 mg total) by mouth 2 (two) times daily with a meal.  . [DISCONTINUED] predniSONE (DELTASONE) 20 MG tablet Take 60 mg by mouth daily with breakfast.  . Ascorbic Acid (VITAMIN C) 1000 MG tablet Take 1,000 mg by mouth daily.  . Cholecalciferol (VITAMIN D-3) 5000 UNITS TABS Take 5,000 Units by mouth daily.  . fluticasone (FLONASE) 50 MCG/ACT nasal spray Place 2 sprays into both nostrils daily.  Marland Kitchen moxifloxacin (AVELOX) 400 MG tablet Take 1 tablet (400 mg total) by mouth daily.  . Multiple Vitamin (MULTIVITAMIN) capsule Take 1 capsule by mouth daily.  . vitamin B-12 (CYANOCOBALAMIN) 1000 MCG tablet Take 1,000 mcg by mouth daily.  . vitamin E 400 UNIT capsule Take 400 Units by mouth daily.  . [DISCONTINUED] fluticasone (FLONASE) 50 MCG/ACT nasal spray Place 1 spray into both nostrils daily as needed for allergies.  . [DISCONTINUED] predniSONE (DELTASONE) 20 MG tablet Take 2 tabs by mouth twice daily x 1 week, then 1 tab three times daily and hold until seen back in office.

## 2013-05-13 NOTE — Assessment & Plan Note (Signed)
Acute on chronic sinusitis Plan Start avelox one daily  Reduce prednisone to 50mg  a day for 5 days then 40mg  daily Use neil med sinus rinse twice daily Use flonase daily two puff ea nostril No other medication changes Return 1 month

## 2013-05-30 ENCOUNTER — Ambulatory Visit (INDEPENDENT_AMBULATORY_CARE_PROVIDER_SITE_OTHER): Payer: Medicare Other | Admitting: Critical Care Medicine

## 2013-05-30 ENCOUNTER — Ambulatory Visit (INDEPENDENT_AMBULATORY_CARE_PROVIDER_SITE_OTHER)
Admission: RE | Admit: 2013-05-30 | Discharge: 2013-05-30 | Disposition: A | Discharge status: Home / Self Care | Payer: Medicare Other | Source: Ambulatory Visit | Attending: Critical Care Medicine | Admitting: Critical Care Medicine

## 2013-05-30 ENCOUNTER — Encounter: Payer: Self-pay | Admitting: Critical Care Medicine

## 2013-05-30 VITALS — BP 126/84 | HR 114 | Temp 97.5°F | Ht 64.0 in | Wt 210.0 lb

## 2013-05-30 DIAGNOSIS — J189 Pneumonia, unspecified organism: Secondary | ICD-10-CM

## 2013-05-30 MED ORDER — AZITHROMYCIN 250 MG PO TABS: ORAL_TABLET | ORAL | Status: DC

## 2013-05-30 NOTE — Progress Notes (Signed)
Subjective:    Patient ID: Jacqueline Washington, female    DOB: 01/08/1951, 62 y.o.   MRN: [id number]  HPI  04/29/13  Post Hospital follow up  HFU - reports breathing is doing well; no new complaints.  finished the Avelox 3 days ago, still taking pred taper at 60mg  BID.  Feels the best she has felt in long time. Breathing is much better. Decreased dyspnea. No drop in O2 sats on O2.  Was admitted October 30 to November 4 for acute on chronic respiratory failure. 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 Discharged on prednisone 60mg  Twice daily  .  She was seen by Rheumatology yesterday and restarted on Arava.  She denies any hemoptysis, orthopnea, PND, or leg swelling Chest x-ray today shows improvement in left upper lobe infiltrate.  05/13/2013 Chief Complaint  Patient presents with  . Follow-up    pt states x 1 week she has been having an increase productive cough with yellow phlegm, increased SOB,  and chest tightness x 1 week.   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 She is finished her antibiotics, and chest x-ray is improved with decreased left upper lobe infiltrate. Begin a slow prednisone taper and hold at 60mg  daily until seen back here in 2 weeks  Plan  Taper Prednisone as follows : Prednisone 20mg  2 Twice daily For 1 week then taper to 20mg  3 tabs daily  (60mg  daily ) and hold at this dose  Continue Advair and Spiriva   Now on 60mg  /d.  Now is worse with breathing.  Was in lungs, not in middle of chest.  Swallow eval was normal Mucus now is yellow, no blood.  No real chest pain. Notes some mucus out of nose, notes some pndrip. Notes some ant sinus pressure.  MOre dyspnea at rest. No f/c/s Rheum MD Azzie Roup.    05/30/2013 Chief Complaint  Patient presents with  . COPD    Breathing is uchanged. Reports coughing, SOB,  chest tightness and wheezing. Feels like she might be getting a cold.   Pt feels like has a "cold"  Pt notes more cough and wheezing.  No real chest pain.  Notes some wheeze, cough is worse.  No fever.  No change in breathing.  No sinus pressure now.  Finished pred pulse now on 40/d pred.  Off abx.  Using sinus rinse.  Past Medical History  Diagnosis Date  . Asthma   . Emphysema     02 dependent 3L/min  . Emphysema   . Pneumonia   . Hypothyroidism   . Diabetes mellitus, type II   . Obesity   . Diastolic heart failure 09/22/2012    Grade 1  . Emphysema   . Rheumatoid arthritis(714.0)   . Hyperlipidemia   . Hypertension   . Vitamin D deficiency      Family History  Problem Relation Age of Onset  . Asthma Mother   . Heart disease Mother   . Clotting disorder Mother   . Heart attack Mother   . Diabetes Mother   . Hypertension Mother   . Cirrhosis Mother   . Colon cancer Neg Hx   . Heart attack Father      History   Social History  . Marital Status: Married    Spouse Name: N/A  Number of Children: 2  . Years of Education: N/A   Occupational History  .     Social History Main Topics  . Smoking status: Former Smoker -- 3.00 packs/day for 43 years    Types: Cigarettes    Quit date: 04/24/2007  . Smokeless tobacco: Never Used     Comment: electronic cigarette daily  . Alcohol Use: Yes     Comment: occasional  . Drug Use: No  . Sexual Activity: No   Other Topics Concern  . Not on file   Social History Narrative  . No narrative on file     Allergies  Allergen Reactions  . Chantix [Varenicline]   . Hctz [Hydrochlorothiazide]   . Penicillins Rash  . Sulfonamide Derivatives Rash  . Tetanus Toxoid Other (See Comments)    Knot on arm     Outpatient Prescriptions Prior to Visit  Medication Sig Dispense Refill  . albuterol (PROVENTIL) (2.5 MG/3ML) 0.083% nebulizer solution Take 2.5 mg by nebulization 4 (four) times daily.      Marland Kitchen allopurinol (ZYLOPRIM) 100  MG tablet Take 100 mg by mouth daily.      . cetirizine (ZYRTEC) 10 MG tablet Take 10 mg by mouth daily.      . Fluticasone-Salmeterol (ADVAIR) 500-50 MCG/DOSE AEPB Inhale 1 puff into the lungs 2 (two) times daily.      . furosemide (LASIX) 40 MG tablet Take 40 mg by mouth daily.       Marland Kitchen HYDROcodone-acetaminophen (NORCO/VICODIN) 5-325 MG per tablet Take 1 tablet by mouth every 6 (six) hours as needed.  90 tablet  0  . ipratropium (ATROVENT) 0.02 % nebulizer solution Take 2.5 mLs (0.5 mg total) by nebulization every 6 (six) hours as needed for wheezing or shortness of breath. Dx 496  300 mL  12  . leflunomide (ARAVA) 10 MG tablet Take 10 mg by mouth daily.      Marland Kitchen levothyroxine (SYNTHROID, LEVOTHROID) 50 MCG tablet 1 tablet twice a week      . montelukast (SINGULAIR) 10 MG tablet Take 1 tablet by mouth daily.      Marland Kitchen nystatin cream (MYCOSTATIN) Apply 1 application topically daily as needed (under breasts).      . pantoprazole (PROTONIX) 40 MG tablet Take 1 tablet (40 mg total) by mouth daily.  30 tablet  6  . potassium chloride SA (K-DUR,KLOR-CON) 20 MEQ tablet Take 60 mEq by mouth daily.      Marland Kitchen tiotropium (SPIRIVA HANDIHALER) 18 MCG inhalation capsule Place 1 capsule (18 mcg total) into inhaler and inhale daily.  30 capsule  12  . fluticasone (FLONASE) 50 MCG/ACT nasal spray Place 2 sprays into both nostrils daily.  16 g  6  . nystatin (MYCOSTATIN) 100000 UNIT/ML suspension Take 5 mLs (500,000 Units total) by mouth 4 (four) times daily.  120 mL  2  . predniSONE (DELTASONE) 20 MG tablet Reduce to 2 1/2 tab daily for 5 days then 2 daily and STAY      . Ascorbic Acid (VITAMIN C) 1000 MG tablet Take 1,000 mg by mouth daily.      . Cholecalciferol (VITAMIN D-3) 5000 UNITS TABS Take 5,000 Units by mouth daily.      Marland Kitchen levofloxacin (LEVAQUIN) 500 MG tablet Take 1 tablet (500 mg total) by mouth daily.  10 tablet  0  . Multiple Vitamin (MULTIVITAMIN) capsule Take 1 capsule by mouth daily.      . vitamin B-12  (CYANOCOBALAMIN) 1000 MCG tablet Take 1,000  mcg by mouth daily.      . vitamin E 400 UNIT capsule Take 400 Units by mouth daily.       No facility-administered medications prior to visit.     Review of Systems  Constitutional:   No  weight loss, night sweats,   fevers, chills, fatigue, or  lassitude.  HEENT:   No headaches,  Difficulty swallowing,  Tooth/dental problems, or  Sore throat,                No sneezing, itching, ear ache,  +nasal congestion, post nasal drip,   CV:  No chest pain, swelling in lower extremities,  NO anasarca, dizziness, palpitations, syncope.   GI  No heartburn, indigestion, abdominal pain, nausea, vomiting, diarrhea, change in bowel habits, loss of appetite, bloody stools.   Resp:   No wheezing.  No chest wall deformity  Skin: no rash or lesions.  GU: no dysuria, change in color of urine, no urgency or frequency.  No flank pain, no hematuria   MS:    No decreased range of motion.  No back pain.  Psych:  No change in mood or affect. No depression or anxiety.  No memory loss.      Objective:   Physical Exam BP 126/84  Pulse 114  Temp(Src) 97.5 F (36.4 C) (Oral)  Ht 5\' 4"  (1.626 m)  Wt 210 lb (95.255 kg)  BMI 36.03 kg/m2  SpO2 90%  GEN: A/Ox3; morbidly obese  HEENT:  [place]/AT, , TMs-wnl, NOSE-clear, THROAT-clear, no lesions, no postnasal drip or exudate noted.   NECK:  Supple w/ fair ROM; no JVD; normal carotid impulses w/o bruits; no thyromegaly or nodules palpated; no lymphadenopathy.  RESP: No wheezes or rhonchi.no accessory muscle use, no dullness to percussion,   CARD: RRR nl s1 s2 no s3 s4  No m r h g  GI:   Soft & nt; nml bowel sounds; no organomegaly or masses detected.  Musco: Warm bil   Neuro: alert, no focal deficits noted.    Skin: Warm, no lesions or rashes  Dg Chest 2 View  05/30/2013   CLINICAL DATA:  Follow-up pneumonia  EXAM: CHEST  2 VIEW  COMPARISON:  04/29/2013.  FINDINGS: There is right apical fibrosis. There  is a area of fibrosis in the left upper lobe and left suprahilar region which appears more confluent on today's examination which may reflect superimposed pneumonia. There is no pleural effusion or pneumothorax. There is upward retraction of bilateral hila. Stable cardiomediastinal silhouette. The osseous structures are unremarkable.  IMPRESSION: There is an area of fibrosis in the left upper lobe and left suprahilar region which appears more confluent on today's examination which may reflect superimposed pneumonia.   Electronically Signed   By: Elige Ko   On: 05/30/2013 10:02       Assessment & Plan:   Pneumonitis- recurrent  History of recurrent pneumonitis with no evidence of acute pneumonia on current chest x-ray but chronic changes left upper lobe Recent acute sinusitis now improving, probable acute tracheobronchitis on current visit Plan Azithromycin for 5 days Maintain prednisone 40 mg daily I did speak with Dr. Azzie Roup of rheumatology who will be seeing the patient in a few weeks to consider other disease modifying therapy but states that this patient's been a challenge to treat with anti-inflammatories because of recurrent infection    Updated Medication List Outpatient Encounter Prescriptions as of 05/30/2013  Medication Sig  . albuterol (PROVENTIL) (2.5 MG/3ML) 0.083% nebulizer  solution Take 2.5 mg by nebulization 4 (four) times daily.  Marland Kitchen allopurinol (ZYLOPRIM) 100 MG tablet Take 100 mg by mouth daily.  . cetirizine (ZYRTEC) 10 MG tablet Take 10 mg by mouth daily.  . Fluticasone-Salmeterol (ADVAIR) 500-50 MCG/DOSE AEPB Inhale 1 puff into the lungs 2 (two) times daily.  . furosemide (LASIX) 40 MG tablet Take 40 mg by mouth daily.   Marland Kitchen HYDROcodone-acetaminophen (NORCO/VICODIN) 5-325 MG per tablet Take 1 tablet by mouth every 6 (six) hours as needed.  Marland Kitchen ipratropium (ATROVENT) 0.02 % nebulizer solution Take 2.5 mLs (0.5 mg total) by nebulization every 6 (six) hours as needed  for wheezing or shortness of breath. Dx 496  . leflunomide (ARAVA) 10 MG tablet Take 10 mg by mouth daily.  Marland Kitchen levothyroxine (SYNTHROID, LEVOTHROID) 50 MCG tablet 1 tablet twice a week  . montelukast (SINGULAIR) 10 MG tablet Take 1 tablet by mouth daily.  Marland Kitchen nystatin cream (MYCOSTATIN) Apply 1 application topically daily as needed (under breasts).  . pantoprazole (PROTONIX) 40 MG tablet Take 1 tablet (40 mg total) by mouth daily.  . potassium chloride SA (K-DUR,KLOR-CON) 20 MEQ tablet Take 60 mEq by mouth daily.  . predniSONE (DELTASONE) 20 MG tablet Take 40 mg by mouth daily.  Marland Kitchen tiotropium (SPIRIVA HANDIHALER) 18 MCG inhalation capsule Place 1 capsule (18 mcg total) into inhaler and inhale daily.  . [DISCONTINUED] fluticasone (FLONASE) 50 MCG/ACT nasal spray Place 2 sprays into both nostrils daily.  . [DISCONTINUED] nystatin (MYCOSTATIN) 100000 UNIT/ML suspension Take 5 mLs (500,000 Units total) by mouth 4 (four) times daily.  . [DISCONTINUED] predniSONE (DELTASONE) 20 MG tablet Reduce to 2 1/2 tab daily for 5 days then 2 daily and STAY  . azithromycin (ZITHROMAX) 250 MG tablet Take two once then one daily until gone  . [DISCONTINUED] Ascorbic Acid (VITAMIN C) 1000 MG tablet Take 1,000 mg by mouth daily.  . [DISCONTINUED] Cholecalciferol (VITAMIN D-3) 5000 UNITS TABS Take 5,000 Units by mouth daily.  . [DISCONTINUED] levofloxacin (LEVAQUIN) 500 MG tablet Take 1 tablet (500 mg total) by mouth daily.  . [DISCONTINUED] Multiple Vitamin (MULTIVITAMIN) capsule Take 1 capsule by mouth daily.  . [DISCONTINUED] vitamin B-12 (CYANOCOBALAMIN) 1000 MCG tablet Take 1,000 mcg by mouth daily.  . [DISCONTINUED] vitamin E 400 UNIT capsule Take 400 Units by mouth daily.

## 2013-05-30 NOTE — Assessment & Plan Note (Signed)
History of recurrent pneumonitis with no evidence of acute pneumonia on current chest x-ray but chronic changes left upper lobe Recent acute sinusitis now improving, probable acute tracheobronchitis on current visit Plan Azithromycin for 5 days Maintain prednisone 40 mg daily I did speak with Dr. Azzie Roup of rheumatology who will be seeing the patient in a few weeks to consider other disease modifying therapy but states that this patient's been a challenge to treat with anti-inflammatories because of recurrent infection

## 2013-05-30 NOTE — Patient Instructions (Signed)
Take azithromycin 250mg  Take two once then one daily until gone Stay on prednisone 40mg  per day No other changes in medications Chest xray today Return 1 month

## 2013-06-02 ENCOUNTER — Ambulatory Visit (INDEPENDENT_AMBULATORY_CARE_PROVIDER_SITE_OTHER): Payer: Medicare Other | Admitting: *Deleted

## 2013-06-02 ENCOUNTER — Other Ambulatory Visit: Payer: Self-pay | Admitting: Emergency Medicine

## 2013-06-02 VITALS — Temp 98.2°F

## 2013-06-02 DIAGNOSIS — E0590 Thyrotoxicosis, unspecified without thyrotoxic crisis or storm: Secondary | ICD-10-CM

## 2013-06-02 DIAGNOSIS — Z79899 Other long term (current) drug therapy: Secondary | ICD-10-CM

## 2013-06-02 DIAGNOSIS — N390 Urinary tract infection, site not specified: Secondary | ICD-10-CM

## 2013-06-02 LAB — TSH: TSH: 3.327 u[IU]/mL (ref 0.350–4.500)

## 2013-06-02 NOTE — Progress Notes (Signed)
Patient ID: Jacqueline Washington, female   DOB: Aug 19, 1950, 62 y.o.   MRN: [id number] Patient presents for nurse visit to recheck TSH, CBC UA ans C&S.  Patient c/o UTI symptoms still present.  Dysuria and burning with urination.

## 2013-06-02 NOTE — Addendum Note (Signed)
Addended by: Loree Fee R on: 06/02/2013 10:40 AM   Modules accepted: Orders

## 2013-06-03 LAB — CBC WITH DIFFERENTIAL/PLATELET
Basophils Absolute: 0.1 10*3/uL (ref 0.0–0.1)
HCT: 30.9 % — ABNORMAL LOW (ref 36.0–46.0)
Lymphocytes Relative: 10 % — ABNORMAL LOW (ref 12–46)
MCH: 25.6 pg — ABNORMAL LOW (ref 26.0–34.0)
Monocytes Relative: 3 % (ref 3–12)
Neutro Abs: 15.9 10*3/uL — ABNORMAL HIGH (ref 1.7–7.7)
Neutrophils Relative %: 87 % — ABNORMAL HIGH (ref 43–77)
Platelets: 301 10*3/uL (ref 150–400)
RDW: 18.8 % — ABNORMAL HIGH (ref 11.5–15.5)
WBC: 18.4 10*3/uL — ABNORMAL HIGH (ref 4.0–10.5)

## 2013-06-04 LAB — URINALYSIS, ROUTINE W REFLEX MICROSCOPIC
Bilirubin Urine: NEGATIVE
Leukocytes, UA: NEGATIVE
Nitrite: POSITIVE — AB
Protein, ur: NEGATIVE mg/dL
Specific Gravity, Urine: 1.01 (ref 1.005–1.030)
Urobilinogen, UA: 0.2 mg/dL (ref 0.0–1.0)

## 2013-06-04 LAB — URINALYSIS, MICROSCOPIC ONLY
Casts: NONE SEEN
Crystals: NONE SEEN
Squamous Epithelial / LPF: NONE SEEN

## 2013-06-08 ENCOUNTER — Telehealth: Payer: Self-pay | Admitting: Physician Assistant

## 2013-06-08 DIAGNOSIS — A419 Sepsis, unspecified organism: Secondary | ICD-10-CM

## 2013-06-08 LAB — URINE CULTURE: Colony Count: >=100000

## 2013-06-08 NOTE — Telephone Encounter (Signed)
Message copied by Doree Albee on Wed Jun 08, 2013 11:34 AM ------      Message from: [place], [place] R      Created: Fri Jun 03, 2013 11:45 AM       Hey I guess this was put under me by accident. I am thinking maybe a recheck from your visit in 11/14      ----- Message -----         From: [facility]         Sent: 06/03/2013   9:19 AM           To: Berenice Primas, PA-C                   ------

## 2013-06-08 NOTE — Telephone Encounter (Signed)
Patient had VRE UTI per lab however after talking with the patient she states she left that urine out and she feels fine and is refusing to go to the ER AMA. She understands the risk of urosepsis and understands the risk of death. She states " I am feeling fine other than my shortness of breath". She denies urinary symptoms, AB pain, back pain, fever, chills. She will go to the ER if she starts to feel bad and will come to the office for stat CBC,BMP and urine on Monday.

## 2013-06-13 ENCOUNTER — Other Ambulatory Visit: Payer: Self-pay | Admitting: Physician Assistant

## 2013-06-13 ENCOUNTER — Ambulatory Visit (INDEPENDENT_AMBULATORY_CARE_PROVIDER_SITE_OTHER): Payer: Medicare Other

## 2013-06-13 VITALS — BP 128/68 | HR 88 | Temp 97.7°F | Resp 20 | Ht 64.0 in | Wt 208.0 lb

## 2013-06-13 DIAGNOSIS — N390 Urinary tract infection, site not specified: Secondary | ICD-10-CM

## 2013-06-13 DIAGNOSIS — J189 Pneumonia, unspecified organism: Secondary | ICD-10-CM

## 2013-06-13 DIAGNOSIS — A419 Sepsis, unspecified organism: Secondary | ICD-10-CM

## 2013-06-13 LAB — CBC WITH DIFFERENTIAL/PLATELET
Basophils Absolute: 0.1 10*3/uL (ref 0.0–0.1)
HCT: 31.2 % — ABNORMAL LOW (ref 36.0–46.0)
Lymphocytes Relative: 5 % — ABNORMAL LOW (ref 12–46)
Monocytes Absolute: 1.2 10*3/uL — ABNORMAL HIGH (ref 0.1–1.0)
Neutro Abs: 19.1 10*3/uL — ABNORMAL HIGH (ref 1.7–7.7)
Neutrophils Relative %: 90 % — ABNORMAL HIGH (ref 43–77)
Platelets: 339 10*3/uL (ref 150–400)
RBC: 3.94 MIL/uL (ref 3.87–5.11)
RDW: 18.5 % — ABNORMAL HIGH (ref 11.5–15.5)
WBC: 21.3 10*3/uL — ABNORMAL HIGH (ref 4.0–10.5)

## 2013-06-13 LAB — URINALYSIS, ROUTINE W REFLEX MICROSCOPIC
Hgb urine dipstick: NEGATIVE
Ketones, ur: NEGATIVE mg/dL
Nitrite: NEGATIVE
Urobilinogen, UA: 0.2 mg/dL (ref 0.0–1.0)

## 2013-06-13 LAB — BASIC METABOLIC PANEL WITH GFR
BUN: 20 mg/dL (ref 6–23)
Chloride: 99 mEq/L (ref 96–112)
Creat: 0.91 mg/dL (ref 0.50–1.10)
GFR, Est Non African American: 68 mL/min
Sodium: 139 mEq/L (ref 135–145)

## 2013-06-13 NOTE — Progress Notes (Signed)
Patient ID: Jacqueline Washington, female   DOB: 1951/02/04, 62 y.o.   MRN: [id number] Patient here today to re-check labs. Vitals taken and patient was sent to the lab.

## 2013-06-14 LAB — URINE CULTURE: Colony Count: 3000

## 2013-06-14 MED ORDER — LEVOFLOXACIN 500 MG PO TABS: 500.0000 mg | ORAL_TABLET | Freq: Every day | ORAL | Status: AC

## 2013-06-14 NOTE — Addendum Note (Signed)
Addended by: Quentin Mulling R on: 06/14/2013 12:31 PM   Modules accepted: Orders

## 2013-06-17 ENCOUNTER — Telehealth: Payer: Self-pay | Admitting: Critical Care Medicine

## 2013-06-17 NOTE — Telephone Encounter (Signed)
Jacqueline Washington aware per Dr Delford Field ok for this order

## 2013-06-17 NOTE — Telephone Encounter (Signed)
lmomtcb on Jacqueline Washington's named VM.

## 2013-06-17 NOTE — Telephone Encounter (Signed)
i am ok with this order 

## 2013-06-17 NOTE — Telephone Encounter (Signed)
PW, please advise if okay to give VO for home health thanks

## 2013-06-20 ENCOUNTER — Other Ambulatory Visit: Payer: Self-pay | Admitting: Adult Health

## 2013-06-22 ENCOUNTER — Telehealth: Payer: Self-pay | Admitting: Critical Care Medicine

## 2013-06-22 ENCOUNTER — Encounter: Payer: Self-pay | Admitting: Internal Medicine

## 2013-06-22 ENCOUNTER — Ambulatory Visit (INDEPENDENT_AMBULATORY_CARE_PROVIDER_SITE_OTHER): Payer: Medicare Other | Admitting: Internal Medicine

## 2013-06-22 ENCOUNTER — Ambulatory Visit: Payer: Self-pay | Admitting: Internal Medicine

## 2013-06-22 VITALS — BP 118/70 | HR 68 | Temp 97.7°F | Resp 18 | Wt 212.8 lb

## 2013-06-22 DIAGNOSIS — Z79899 Other long term (current) drug therapy: Secondary | ICD-10-CM

## 2013-06-22 DIAGNOSIS — J209 Acute bronchitis, unspecified: Secondary | ICD-10-CM

## 2013-06-22 DIAGNOSIS — I10 Essential (primary) hypertension: Secondary | ICD-10-CM

## 2013-06-22 DIAGNOSIS — R7309 Other abnormal glucose: Secondary | ICD-10-CM | POA: Insufficient documentation

## 2013-06-22 DIAGNOSIS — J449 Chronic obstructive pulmonary disease, unspecified: Secondary | ICD-10-CM

## 2013-06-22 LAB — HEMOGLOBIN A1C
HEMOGLOBIN A1C: 6.4 % — AB (ref <5.7)
Mean Plasma Glucose: 137 mg/dL — ABNORMAL HIGH (ref <117)

## 2013-06-22 MED ORDER — PREDNISONE 20 MG PO TABS: ORAL_TABLET | ORAL | Status: DC

## 2013-06-22 NOTE — Progress Notes (Signed)
Patient ID: Jacqueline Washington, female   DOB: 1950-08-23, 63 y.o.   MRN: [id number]   This very nice 63 y.o. MWF morbidly obese with end stage COLD presents for 2 week follow up of elevated WBC felt most likely related to lung infection. Patient had been Tx'd with a Z Pak on 05/30/13 by Dr Delford Field and then with Levaquin 05/27/13 for 10 days presumptive of a respiratory infection (actually didn't start until 3 days ago). She does report a cough productive of scant amounts of yellowish sputum. She denies fever, chills or sweats. She states her O2 sat's run around 88% on Rm Air and 92-93% on # liters.   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 Patient has been resistant to re-hospitalization following last year's several hospitalizations followed by prolonged Nursing Home convalescences. Other current problems include Hypertension, Hyperlipidemia, GERD, Gout, RH Arthritis, Hypothyroidism, Gout, T2 Diabetes (currently not requiring SS coverage) and Vitamin D Deficiency.     Today's BP: 118/70 mmHg. Patient denies any cardiac type chest pain, palpitations, orthopnea/PND, dizziness, claudication, or dependent edema.   Also, the patient has history of PreDiabetes with last A1c of 5.4% and has not been monitoring CBG's at home. She has been on Prednisone 40 mg daily for an extended period and her Rx runs out tomorrowPatient denies any symptoms of reactive hypoglycemia, diabetic polys, paresthesias or visual blurring.    Current Outpatient Prescriptions on File Prior to Visit  Medication Sig Dispense Refill  . albuterol (PROVENTIL) (2.5 MG/3ML) 0.083% nebulizer solution Take 2.5 mg by nebulization 4 (four) times daily.      Marland Kitchen allopurinol (ZYLOPRIM) 100 MG tablet Take 100 mg by mouth daily.      . cetirizine (ZYRTEC) 10 MG tablet Take 10 mg by mouth daily.      . Fluticasone-Salmeterol (ADVAIR) 500-50 MCG/DOSE AEPB Inhale 1 puff into the lungs 2 (two)  times daily.      . furosemide (LASIX) 40 MG tablet Take 40 mg by mouth daily.       Marland Kitchen HYDROcodone-acetaminophen (NORCO/VICODIN) 5-325 MG per tablet Take 1 tablet by mouth every 6 (six) hours as needed.  90 tablet  0  . ipratropium (ATROVENT) 0.02 % nebulizer solution Take 2.5 mLs (0.5 mg total) by nebulization every 6 (six) hours as needed for wheezing or shortness of breath. Dx 496  300 mL  12  . leflunomide (ARAVA) 10 MG tablet Take 10 mg by mouth daily.      Marland Kitchen levofloxacin (LEVAQUIN) 500 MG tablet Take 1 tablet (500 mg total) by mouth daily.  10 tablet  0  . montelukast (SINGULAIR) 10 MG tablet Take 1 tablet by mouth daily.      Marland Kitchen nystatin cream (MYCOSTATIN) Apply 1 application topically daily as needed (under breasts).      . pantoprazole (PROTONIX) 40 MG tablet Take 1 tablet (40 mg total) by mouth daily.  30 tablet  6  . potassium chloride SA (K-DUR,KLOR-CON) 20 MEQ tablet Take 60 mEq by mouth daily.      Marland Kitchen tiotropium (SPIRIVA HANDIHALER) 18 MCG inhalation capsule Place 1 capsule (18 mcg total) into inhaler and inhale daily.  30 capsule  12   No current facility-administered medications on file prior to visit.     Allergies  Allergen Reactions  . Chantix [Varenicline]   . Hctz [Hydrochlorothiazide]   . Penicillins Rash  . Sulfonamide Derivatives Rash  . Tetanus Toxoid Other (See  Comments)    Knot on arm    PMHx:   Past Medical History  Diagnosis Date  . Asthma   . Emphysema     02 dependent 3L/min  . Emphysema   . Pneumonia   . Hypothyroidism   . Diabetes mellitus, type II   . Obesity   . Diastolic heart failure 09/22/2012    Grade 1  . Emphysema   . Rheumatoid arthritis(714.0)   . Hyperlipidemia   . Hypertension   . Vitamin D deficiency     FHx:    Reviewed / unchanged  SHx:    Reviewed / unchanged  Systems Review: Constitutional: Denies fever, chills, wt changes, headaches, insomnia, fatigue, night sweats, change in appetite. Eyes: Denies redness, blurred  vision, diplopia, discharge, itchy, watery eyes.  ENT: Denies discharge, congestion, post nasal drip, epistaxis, sore throat, earache, hearing loss, dental pain, tinnitus, vertigo, sinus pain, snoring.  CV: Denies chest pain, palpitations, irregular heartbeat, syncope, dyspnea, diaphoresis, orthopnea, PND, claudication, edema. Respiratory: denies cough, dyspnea, DOE, pleurisy, hoarseness, laryngitis, wheezing.  Gastrointestinal: Denies dysphagia, odynophagia, heartburn, reflux, water brash, abdominal pain or cramps, nausea, vomiting, bloating, diarrhea, constipation, hematemesis, melena, hematochezia,  or hemorrhoids. Genitourinary: Denies dysuria, frequency, urgency, nocturia, hesitancy, discharge, hematuria, flank pain. Musculoskeletal: Denies arthralgias, myalgias, stiffness, jt. swelling, pain, limp, strain/sprain.  Skin: Denies pruritus, rash, hives, warts, acne, eczema, change in skin lesion(s). Neuro: No weakness, tremor, incoordination, spasms, paresthesia, or pain. Psychiatric: Denies confusion, memory loss, or sensory loss. Endo: Denies change in weight, skin, hair change.  Heme/Lymph: No excessive bleeding, bruising, orenlarged lymph nodes.  Filed Vitals:   06/22/13 1234  BP: 118/70  Pulse: 68  Temp: 97.7 F (36.5 C)  Resp: 18    Estimated body mass index is 36.51 kg/(m^2) as calculated from the following:   Height as of 06/13/13: 5\' 4"  (1.626 m).   Weight as of this encounter: 212 lb 12.8 oz (96.525 kg).  On Exam: Appears over nourished and morbidly obese - in no distress. Eyes: PERRLA, EOMs, conjunctiva no swelling or erythema. Sinuses: No frontal/maxillary tenderness ENT/Mouth: EAC's clear, TM's nl w/o erythema, bulging. Nares clear w/o erythema, swelling, exudates. Oropharynx clear without erythema or exudates. Oral hygiene is good. Tongue normal, non obstructing. Hearing intact.  Neck: Supple. Thyroid nl. Car 2+/2+ without bruits, nodes or JVD. Chest: Respirations nl  with BS clear & equal w/o rales, rhonchi, wheezing or stridor.  Cor: Heart sounds normal w/ regular rate and rhythm without sig. murmurs, gallops, clicks, or rubs. Peripheral pulses normal and equal  without edema.  Abdomen: Soft & bowel sounds normal. Non-tender w/o guarding, rebound, hernias, masses, or organomegaly.  Lymphatics: Unremarkable.  Musculoskeletal: Full ROM all peripheral extremities, joint stability, 5/5 strength, and normal gait.  Skin: Warm, dry without exposed rashes, lesions, ecchymosis apparent.  Neuro: Cranial nerves intact, reflexes equal bilaterally. Sensory-motor testing grossly intact. Tendon reflexes grossly intact.  Pysch: Alert & oriented x 3. Insight and judgement nl & appropriate. No ideations.  Assessment and Plan:  1. AECB - continue Levaquin  2. ES COPD - O2 dependent - refill Pred 20 mg til sees Dr Delford Field in 2 weeks  3. Hypertension - Continue monitor blood pressure at home. Continue diet/meds same.  4. Hyperlipidemia - Continue diet/meds, exercise,& lifestyle modifications. Continue monitor periodic cholesterol/liver & renal functions   5. Diabetes - continue recommend prudent low glycemic diet, weight control, regular exercise, diabetic monitoring   6. Vitamin D Deficiency - Continue supplementation.  Recommended regular  exercise, BP monitoring, weight control, and discussed med and SE's. Recommended labs to assess and monitor clinical status. Further disposition pending results of labs.

## 2013-06-22 NOTE — Telephone Encounter (Signed)
lmomtcb x1 for pt 

## 2013-06-22 NOTE — Telephone Encounter (Signed)
I called and spoke with Rosalita Chessman. This was giving VO on 06/17/13. She did not receive this message. I gave okay via VO to [place]. Nothing further needed

## 2013-06-23 ENCOUNTER — Other Ambulatory Visit: Payer: Self-pay | Admitting: Internal Medicine

## 2013-06-23 LAB — BASIC METABOLIC PANEL WITH GFR
BUN: 18 mg/dL (ref 6–23)
CO2: 30 meq/L (ref 19–32)
Calcium: 8.7 mg/dL (ref 8.4–10.5)
Chloride: 100 mEq/L (ref 96–112)
Creat: 0.77 mg/dL (ref 0.50–1.10)
GFR, EST NON AFRICAN AMERICAN: 83 mL/min
GFR, Est African American: >89 mL/min
Glucose, Bld: 114 mg/dL — ABNORMAL HIGH (ref 70–99)
POTASSIUM: 3.7 meq/L (ref 3.5–5.3)
Sodium: 140 mEq/L (ref 135–145)

## 2013-06-23 LAB — CBC WITH DIFFERENTIAL/PLATELET
BASOS ABS: 0 10*3/uL (ref 0.0–0.1)
Basophils Relative: 0 % (ref 0–1)
Eosinophils Absolute: 0 10*3/uL (ref 0.0–0.7)
Eosinophils Relative: 0 % (ref 0–5)
HCT: 29.1 % — ABNORMAL LOW (ref 36.0–46.0)
HEMOGLOBIN: 9 g/dL — AB (ref 12.0–15.0)
LYMPHS PCT: 5 % — AB (ref 12–46)
Lymphs Abs: 0.6 10*3/uL — ABNORMAL LOW (ref 0.7–4.0)
MCH: 24.3 pg — ABNORMAL LOW (ref 26.0–34.0)
MCHC: 30.9 g/dL (ref 30.0–36.0)
MCV: 78.6 fL (ref 78.0–100.0)
Monocytes Absolute: 0.5 10*3/uL (ref 0.1–1.0)
Monocytes Relative: 4 % (ref 3–12)
NEUTROS ABS: 12 10*3/uL — AB (ref 1.7–7.7)
Neutrophils Relative %: 91 % — ABNORMAL HIGH (ref 43–77)
Platelets: 299 10*3/uL (ref 150–400)
RBC: 3.7 MIL/uL — AB (ref 3.87–5.11)
RDW: 18.7 % — AB (ref 11.5–15.5)
WBC: 13.2 10*3/uL — AB (ref 4.0–10.5)

## 2013-06-23 LAB — TSH: TSH: 0.913 u[IU]/mL (ref 0.350–4.500)

## 2013-06-23 LAB — MAGNESIUM: Magnesium: 1.8 mg/dL (ref 1.5–2.5)

## 2013-06-23 MED ORDER — ALBUTEROL SULFATE (2.5 MG/3ML) 0.083% IN NEBU: 2.5000 mg | INHALATION_SOLUTION | Freq: Four times a day (QID) | RESPIRATORY_TRACT | Status: DC

## 2013-06-23 NOTE — Telephone Encounter (Signed)
Returning call can be reached at [phone].Raylene Everts

## 2013-06-23 NOTE — Telephone Encounter (Signed)
Pt is aware. She also reports she needs her albuterol neb RX sent to the pharmacy for a month supply. i advised will do so. Nothing further needed

## 2013-06-23 NOTE — Telephone Encounter (Signed)
Called [facility] and they have the rx that Dr. Oneta Rack send in yesterday.  i called the pt and she is aware that the rx is at the pharmacy.    Pt was calling to see if PW wanted her to reduce the dosage of the prednisone or keep taking the 40 mg daily that she has been on since December.  She stated that Dr. Oneta Rack told her that PW would let her know how to take the prednisone.  Pt has pending appt with PW on 1/22.  Please advise. Thanks  Allergies  Allergen Reactions  . Chantix [Varenicline]   . Hctz [Hydrochlorothiazide]   . Penicillins Rash  . Sulfonamide Derivatives Rash  . Tetanus Toxoid Other (See Comments)    Knot on arm     Current Outpatient Prescriptions on File Prior to Visit  Medication Sig Dispense Refill  . albuterol (PROVENTIL) (2.5 MG/3ML) 0.083% nebulizer solution Take 2.5 mg by nebulization 4 (four) times daily.      Marland Kitchen allopurinol (ZYLOPRIM) 100 MG tablet Take 100 mg by mouth daily.      . cetirizine (ZYRTEC) 10 MG tablet Take 10 mg by mouth daily.      . fluticasone (FLONASE) 50 MCG/ACT nasal spray       . Fluticasone-Salmeterol (ADVAIR) 500-50 MCG/DOSE AEPB Inhale 1 puff into the lungs 2 (two) times daily.      . furosemide (LASIX) 40 MG tablet Take 40 mg by mouth daily.       Marland Kitchen HYDROcodone-acetaminophen (NORCO/VICODIN) 5-325 MG per tablet Take 1 tablet by mouth every 6 (six) hours as needed.  90 tablet  0  . ipratropium (ATROVENT) 0.02 % nebulizer solution Take 2.5 mLs (0.5 mg total) by nebulization every 6 (six) hours as needed for wheezing or shortness of breath. Dx 496  300 mL  12  . leflunomide (ARAVA) 10 MG tablet Take 10 mg by mouth daily.      Marland Kitchen levofloxacin (LEVAQUIN) 500 MG tablet Take 1 tablet (500 mg total) by mouth daily.  10 tablet  0  . montelukast (SINGULAIR) 10 MG tablet Take 1 tablet by mouth daily.      Marland Kitchen nystatin cream (MYCOSTATIN) Apply 1 application topically daily as needed (under breasts).      . pantoprazole (PROTONIX) 40 MG tablet Take 1 tablet  (40 mg total) by mouth daily.  30 tablet  6  . potassium chloride SA (K-DUR,KLOR-CON) 20 MEQ tablet Take 60 mEq by mouth daily.      . predniSONE (DELTASONE) 20 MG tablet Take 1 to 2 tablets daily as directed until see Dr Delford Field  60 tablet  1  . tiotropium (SPIRIVA HANDIHALER) 18 MCG inhalation capsule Place 1 capsule (18 mcg total) into inhaler and inhale daily.  30 capsule  12   No current facility-administered medications on file prior to visit.

## 2013-06-23 NOTE — Telephone Encounter (Signed)
Change to 40mg  /d pred

## 2013-07-07 ENCOUNTER — Ambulatory Visit (INDEPENDENT_AMBULATORY_CARE_PROVIDER_SITE_OTHER): Payer: Medicare Other | Admitting: Critical Care Medicine

## 2013-07-07 ENCOUNTER — Encounter: Payer: Self-pay | Admitting: Critical Care Medicine

## 2013-07-07 VITALS — BP 134/80 | HR 95 | Temp 97.9°F | Ht 64.0 in | Wt 216.0 lb

## 2013-07-07 DIAGNOSIS — M069 Rheumatoid arthritis, unspecified: Secondary | ICD-10-CM

## 2013-07-07 DIAGNOSIS — R5381 Other malaise: Secondary | ICD-10-CM

## 2013-07-07 DIAGNOSIS — J189 Pneumonia, unspecified organism: Secondary | ICD-10-CM

## 2013-07-07 MED ORDER — HYDROCODONE-ACETAMINOPHEN 5-325 MG PO TABS: 1.0000 | ORAL_TABLET | Freq: Four times a day (QID) | ORAL | Status: DC | PRN

## 2013-07-07 MED ORDER — PREDNISONE 5 MG PO TABS
ORAL_TABLET | ORAL | Status: DC
Start: 2013-07-07 — End: 2013-08-10

## 2013-07-07 MED ORDER — PREDNISONE 20 MG PO TABS: ORAL_TABLET | ORAL | Status: DC

## 2013-07-07 NOTE — Progress Notes (Signed)
Subjective:    Patient ID: Jacqueline Washington, female    DOB: January 22, 1951, 63 y.o.   MRN: [id number]  HPI  07/07/2013 Chief Complaint  Patient presents with  . Follow-up    Pt c/o increased SOB, difficulty getting around house.  Prod cough in a.m. with yellow mucous.    No new issues. The patient is having some dyspnea with exertion. Some difficulty getting around the house. Does have a productive cough. Pt denies any significant sore throat, nasal congestion or excess secretions, fever, chills, sweats, unintended weight loss, pleurtic or exertional chest pain, orthopnea PND, or leg swelling Pt denies any increase in rescue therapy over baseline, denies waking up needing it or having any early am or nocturnal exacerbations of coughing/wheezing/or dyspnea. Pt also denies any obvious fluctuation in symptoms with  weather or environmental change or other alleviating or aggravating factors  Past Medical History  Diagnosis Date  . Asthma   . Emphysema     02 dependent 3L/min  . Emphysema   . Pneumonia   . Hypothyroidism   . Diabetes mellitus, type II   . Obesity   . Diastolic heart failure 09/22/2012    Grade 1  . Emphysema   . Rheumatoid arthritis(714.0)   . Hyperlipidemia   . Hypertension   . Vitamin D deficiency      Family History  Problem Relation Age of Onset  . Asthma Mother   . Heart disease Mother   . Clotting disorder Mother   . Heart attack Mother   . Diabetes Mother   . Hypertension Mother   . Cirrhosis Mother   . Colon cancer Neg Hx   . Heart attack Father      History   Social History  . Marital Status: Married    Spouse Name: N/A    Number of Children: 2  . Years of Education: N/A   Occupational History  .     Social History Main Topics  . Smoking status: Former Smoker -- 3.00 packs/day for 43 years    Types: Cigarettes    Quit date: 04/24/2007  . Smokeless tobacco: Never Used     Comment: electronic cigarette daily  . Alcohol Use: Yes     Comment:  occasional  . Drug Use: No  . Sexual Activity: No   Other Topics Concern  . Not on file   Social History Narrative  . No narrative on file     Allergies  Allergen Reactions  . Chantix [Varenicline]   . Hctz [Hydrochlorothiazide]   . Penicillins Rash  . Sulfonamide Derivatives Rash  . Tetanus Toxoid Other (See Comments)    Knot on arm     Outpatient Prescriptions Prior to Visit  Medication Sig Dispense Refill  . albuterol (PROVENTIL) (2.5 MG/3ML) 0.083% nebulizer solution Take 3 mLs (2.5 mg total) by nebulization 4 (four) times daily. DX 496  360 mL  6  . allopurinol (ZYLOPRIM) 100 MG tablet Take 100 mg by mouth daily.      . cetirizine (ZYRTEC) 10 MG tablet Take 10 mg by mouth daily.      . Fluticasone-Salmeterol (ADVAIR) 500-50 MCG/DOSE AEPB Inhale 1 puff into the lungs 2 (two) times daily.      . furosemide (LASIX) 40 MG tablet Take 40 mg by mouth daily.       Marland Kitchen ipratropium (ATROVENT) 0.02 % nebulizer solution Take 2.5 mLs (0.5 mg total) by nebulization every 6 (six) hours as needed for wheezing or shortness of  breath. Dx 496  300 mL  12  . montelukast (SINGULAIR) 10 MG tablet Take 1 tablet by mouth daily.      Marland Kitchen nystatin cream (MYCOSTATIN) Apply 1 application topically daily as needed (under breasts).      . pantoprazole (PROTONIX) 40 MG tablet Take 1 tablet (40 mg total) by mouth daily.  30 tablet  6  . potassium chloride SA (K-DUR,KLOR-CON) 20 MEQ tablet Take 60 mEq by mouth daily.      Marland Kitchen tiotropium (SPIRIVA HANDIHALER) 18 MCG inhalation capsule Place 1 capsule (18 mcg total) into inhaler and inhale daily.  30 capsule  12  . HYDROcodone-acetaminophen (NORCO/VICODIN) 5-325 MG per tablet Take 1 tablet by mouth every 6 (six) hours as needed.  90 tablet  0  . leflunomide (ARAVA) 10 MG tablet Take 10 mg by mouth daily.      . predniSONE (DELTASONE) 20 MG tablet Take 1 to 2 tablets daily as directed until see Dr Delford Field  60 tablet  1  . fluticasone (FLONASE) 50 MCG/ACT nasal spray         No facility-administered medications prior to visit.     Review of Systems  Constitutional:   No  weight loss, night sweats,   fevers, chills, fatigue, or  lassitude.  HEENT:   No headaches,  Difficulty swallowing,  Tooth/dental problems, or  Sore throat,                No sneezing, itching, ear ache,  +nasal congestion, post nasal drip,   CV:  No chest pain, swelling in lower extremities,  NO anasarca, dizziness, palpitations, syncope.   GI  No heartburn, indigestion, abdominal pain, nausea, vomiting, diarrhea, change in bowel habits, loss of appetite, bloody stools.   Resp:   No wheezing.  No chest wall deformity  Skin: no rash or lesions.  GU: no dysuria, change in color of urine, no urgency or frequency.  No flank pain, no hematuria   MS:    No decreased range of motion.  No back pain.  Psych:  No change in mood or affect. No depression or anxiety.  No memory loss.      Objective:   Physical Exam BP 134/80  Pulse 95  Temp(Src) 97.9 F (36.6 C) (Oral)  Ht 5\' 4"  (1.626 m)  Wt 216 lb (97.977 kg)  BMI 37.06 kg/m2  SpO2 94%  GEN: A/Ox3; morbidly obese  HEENT:  [place]/AT, , TMs-wnl, NOSE-clear, THROAT-clear, no lesions, no postnasal drip or exudate noted.   NECK:  Supple w/ fair ROM; no JVD; normal carotid impulses w/o bruits; no thyromegaly or nodules palpated; no lymphadenopathy.  RESP: No wheezes or rhonchi.no accessory muscle use, no dullness to percussion,   CARD: RRR nl s1 s2 no s3 s4  No m r h g  GI:   Soft & nt; nml bowel sounds; no organomegaly or masses detected.  Musco: Warm bil   Neuro: alert, no focal deficits noted.    Skin: Warm, no lesions or rashes  No results found.     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 albuterol (PROVENTIL) (2.5 MG/3ML) 0.083% nebulizer  solution Take 3 mLs (2.5 mg total) by nebulization 4 (four) times daily. DX 496  . allopurinol (ZYLOPRIM) 100 MG tablet Take 100 mg by mouth daily.  . cetirizine (ZYRTEC) 10 MG tablet Take 10 mg by mouth daily.  . Fluticasone-Salmeterol (ADVAIR) 500-50 MCG/DOSE AEPB Inhale 1 puff into the lungs 2 (two) times daily.  . furosemide (LASIX) 40 MG tablet Take 40 mg by mouth daily.   Marland Kitchen HYDROcodone-acetaminophen (NORCO/VICODIN) 5-325 MG per tablet Take 1 tablet by mouth every 6 (six) hours as needed.  Marland Kitchen ipratropium (ATROVENT) 0.02 % nebulizer solution Take 2.5 mLs (0.5 mg total) by nebulization every 6 (six) hours as needed for wheezing or shortness of breath. Dx 496  . leflunomide (ARAVA) 20 MG tablet Take 20 mg by mouth daily.  . montelukast (SINGULAIR) 10 MG tablet Take 1 tablet by mouth daily.  Marland Kitchen nystatin cream (MYCOSTATIN) Apply 1 application topically daily as needed (under breasts).  . pantoprazole (PROTONIX) 40 MG tablet Take 1 tablet (40 mg total) by mouth daily.  . potassium chloride SA (K-DUR,KLOR-CON) 20 MEQ tablet Take 60 mEq by mouth daily.  . predniSONE (DELTASONE) 20 MG tablet Reduce to 35 mg daily for 7days then reduce to 30mg  per day and stay  (use 20mg  and 5mg  pred tabs)  . tiotropium (SPIRIVA HANDIHALER) 18 MCG inhalation capsule Place 1 capsule (18 mcg total) into inhaler and inhale daily.  . [DISCONTINUED] HYDROcodone-acetaminophen (NORCO/VICODIN) 5-325 MG per tablet Take 1 tablet by mouth every 6 (six) hours as needed.  . [DISCONTINUED] leflunomide (ARAVA) 10 MG tablet Take 10 mg by mouth daily.  . [DISCONTINUED] predniSONE (DELTASONE) 20 MG tablet Take 1 to 2 tablets daily as directed until see Dr  . predniSONE (DELTASONE) 5 MG tablet Use with 20mg  prednisone for taper as directed  .  [DISCONTINUED] fluticasone (FLONASE) 50 MCG/ACT nasal spray

## 2013-07-07 NOTE — Patient Instructions (Signed)
Reduce Prednisone to 35mg  per day (use 5mg  tab plus 1 1/2 20mg  tab) for 7days then reduce to 30mg  per day and stay (either 1 1/2 20mg  or 1 20mg  and 2 5mg  tab)  Refill on pain meds given Home health Physical therapy ordered Stay on inhalers Return 2 months

## 2013-07-08 NOTE — Assessment & Plan Note (Signed)
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

## 2013-07-11 ENCOUNTER — Telehealth: Payer: Self-pay | Admitting: Critical Care Medicine

## 2013-07-11 NOTE — Telephone Encounter (Signed)
I am fine with this order

## 2013-07-11 NOTE — Telephone Encounter (Signed)
Dr. Delford Field please advise if okay? thanks

## 2013-07-11 NOTE — Telephone Encounter (Signed)
I called and spoke with Wes. Gave VO. Nothing further needed

## 2013-07-12 ENCOUNTER — Telehealth: Payer: Self-pay

## 2013-07-12 NOTE — Telephone Encounter (Signed)
Dr.Anderson's office called requesting pt's last lab results. Results were faxed to [phone]

## 2013-07-13 ENCOUNTER — Telehealth: Payer: Self-pay | Admitting: Critical Care Medicine

## 2013-07-13 DIAGNOSIS — J449 Chronic obstructive pulmonary disease, unspecified: Secondary | ICD-10-CM

## 2013-07-13 DIAGNOSIS — R5381 Other malaise: Secondary | ICD-10-CM

## 2013-07-13 NOTE — Telephone Encounter (Signed)
Pt is calling to speak w/ nurse regarding the same & can be reached at [phone].  Jacqueline Washington

## 2013-07-13 NOTE — Telephone Encounter (Signed)
Attempted to call [facility] to make the aware that [facility] is not able to service the pt for PT----will try back tomorrow---[facility] closes at 5.  [facility] cannot service [facility] pts for [facility] in [place] per Azoria Cower

## 2013-07-14 NOTE — Telephone Encounter (Signed)
288 # is not correct so I called [facility] at [phone] adn [facility] with Arline Asp to see what is needed. Carron Curie, CMA

## 2013-07-15 NOTE — Telephone Encounter (Signed)
[facility] is for PT.  [facility] provides pt oxygen [facility] does not do oxygen She needs both

## 2013-07-15 NOTE — Telephone Encounter (Signed)
I called and made Cindy aware of the below. She reports the patient can only have 1 agency in. If [facility] can't service the pt then they need to d/c her.  SN please advise if okay to send d/c order to [facility]? thanks

## 2013-07-15 NOTE — Telephone Encounter (Signed)
Will forward to PW---SN has never seen this pt.  PW please advise. thanks

## 2013-07-15 NOTE — Telephone Encounter (Signed)
PCC's please help with this [facility] can not provide PT and [facility] does not provide o2  Apparently pt can only have 1 DME?

## 2013-07-19 NOTE — Telephone Encounter (Signed)
Pt is asking to speak w/ PW or his nurse to get a status update, please.  Antionette Fairy

## 2013-07-19 NOTE — Telephone Encounter (Signed)
Skilled RN due to high fall risk, high risk medications, severe dyspnea, frequent admissions

## 2013-07-19 NOTE — Telephone Encounter (Signed)
Per [facility] can do both skilled nursing and PT for the patient.  Dr. Delford Field after speaking to [place] the pt has had skilled nursing for over a year. They want to clarify is this still needed for the patient and if so what are the reasons needed for skilled nursing. Please advise. Carron Curie, CMA

## 2013-07-19 NOTE — Telephone Encounter (Signed)
i need and order for this Jacqueline Washington

## 2013-07-19 NOTE — Telephone Encounter (Signed)
Spoke with [employer]. She states that it's not a problem that the pt has 2 DME. Pt can't have 2 healthcare providers from 2 different companies seeing her at one time. She already currently has a nurse from [facility] coming to her home every week. With this [place] can't bill for their services while another home care company to actively treating the pt. Per Arline Asp, if [facility] is already seeing the pt in home, they need to be the ones to do PT.  [facility] doesn't provide HH to [facility] pts in [place] per [place] [facility]. Skilled nursing through [facility] was ordered by PW. I will send an order to [facility] to cancel skilled nursing so Genevieve Norlander can do PT.  I have left a message with Arline Asp at [facility] to see if they can provide pt with skilled nursing along with PT.

## 2013-07-20 NOTE — Telephone Encounter (Signed)
lmtcb x1 for Jacqueline Washington

## 2013-07-21 ENCOUNTER — Telehealth: Payer: Self-pay

## 2013-07-21 NOTE — Telephone Encounter (Signed)
Order placed. Kaianna Dolezal, CMA  

## 2013-07-21 NOTE — Telephone Encounter (Signed)
Larita Fife from Dr.Anderson's office called requesting pt's lab results. Faxed labs to [facility] [phone]

## 2013-07-22 ENCOUNTER — Telehealth: Payer: Self-pay | Admitting: Critical Care Medicine

## 2013-07-22 DIAGNOSIS — R5381 Other malaise: Secondary | ICD-10-CM

## 2013-07-22 NOTE — Telephone Encounter (Signed)
atc na

## 2013-07-22 NOTE — Telephone Encounter (Signed)
Gina returned triage's call.  Antionette Fairy

## 2013-07-22 NOTE — Telephone Encounter (Signed)
lmtcb x1 

## 2013-07-23 ENCOUNTER — Other Ambulatory Visit: Payer: Self-pay | Admitting: Internal Medicine

## 2013-07-25 ENCOUNTER — Telehealth: Payer: Self-pay | Admitting: Critical Care Medicine

## 2013-07-25 NOTE — Telephone Encounter (Signed)
Duplicate. Jennifer Castillo, CMA  

## 2013-07-25 NOTE — Telephone Encounter (Signed)
LMTCBx1.Zyan Mirkin, CMA  

## 2013-07-28 ENCOUNTER — Telehealth: Payer: Self-pay | Admitting: Critical Care Medicine

## 2013-07-28 DIAGNOSIS — J449 Chronic obstructive pulmonary disease, unspecified: Secondary | ICD-10-CM

## 2013-07-28 NOTE — Telephone Encounter (Signed)
Almira Coaster called back and she is aware that the pt has been using [facility] and [facility].  [facility] was unable to file the charges since the pt was using both home care companies.  Almira Coaster stated that they will be able to provide the PT and home care for the pt, since this is the way the pt wanted to go.  Order has been placed for the PT to be done by [facility].  Almira Coaster is aware.  Message has been sent to [facility].

## 2013-07-28 NOTE — Telephone Encounter (Signed)
LMTC x 1 with Jerilynn Mages at [facility]

## 2013-07-28 NOTE — Telephone Encounter (Signed)
LMTCB

## 2013-07-29 NOTE — Telephone Encounter (Signed)
I spoke with the pt and she states we sent the order to [facility] yesterday about her PT and nursing care. She states now she wants an order sent to [facility] for them to take over her oxygen. She wants all of her services at one place to limit confusion. Pt is currently with [facility]. I advised the pt that we can send the order. I advised she may need a visit for qualifying sats, etc. Pt states understanding.  Carron Curie, CMA

## 2013-08-03 ENCOUNTER — Ambulatory Visit (INDEPENDENT_AMBULATORY_CARE_PROVIDER_SITE_OTHER): Payer: Medicare Other | Admitting: Emergency Medicine

## 2013-08-03 ENCOUNTER — Ambulatory Visit (HOSPITAL_COMMUNITY)
Admission: RE | Admit: 2013-08-03 | Discharge: 2013-08-03 | Disposition: A | Discharge status: Home / Self Care | Payer: Medicare Other | Source: Ambulatory Visit | Attending: Emergency Medicine | Admitting: Emergency Medicine

## 2013-08-03 ENCOUNTER — Other Ambulatory Visit: Payer: Self-pay | Admitting: Emergency Medicine

## 2013-08-03 ENCOUNTER — Encounter: Payer: Self-pay | Admitting: Emergency Medicine

## 2013-08-03 VITALS — HR 130 | Temp 98.0°F | Resp 20

## 2013-08-03 DIAGNOSIS — R0609 Other forms of dyspnea: Secondary | ICD-10-CM

## 2013-08-03 DIAGNOSIS — R6889 Other general symptoms and signs: Secondary | ICD-10-CM

## 2013-08-03 DIAGNOSIS — R5381 Other malaise: Secondary | ICD-10-CM

## 2013-08-03 DIAGNOSIS — R0602 Shortness of breath: Secondary | ICD-10-CM | POA: Insufficient documentation

## 2013-08-03 DIAGNOSIS — B372 Candidiasis of skin and nail: Secondary | ICD-10-CM

## 2013-08-03 DIAGNOSIS — Z8701 Personal history of pneumonia (recurrent): Secondary | ICD-10-CM | POA: Insufficient documentation

## 2013-08-03 DIAGNOSIS — R5383 Other fatigue: Secondary | ICD-10-CM

## 2013-08-03 DIAGNOSIS — R0989 Other specified symptoms and signs involving the circulatory and respiratory systems: Secondary | ICD-10-CM

## 2013-08-03 DIAGNOSIS — R0600 Dyspnea, unspecified: Secondary | ICD-10-CM

## 2013-08-03 DIAGNOSIS — R109 Unspecified abdominal pain: Secondary | ICD-10-CM

## 2013-08-03 DIAGNOSIS — R1010 Upper abdominal pain, unspecified: Secondary | ICD-10-CM

## 2013-08-03 DIAGNOSIS — J8410 Pulmonary fibrosis, unspecified: Secondary | ICD-10-CM | POA: Insufficient documentation

## 2013-08-03 LAB — HEPATIC FUNCTION PANEL
ALK PHOS: 71 U/L (ref 39–117)
ALT: 13 U/L (ref 0–35)
AST: 12 U/L (ref 0–37)
Albumin: 3.2 g/dL — ABNORMAL LOW (ref 3.5–5.2)
Bilirubin, Direct: 0.1 mg/dL (ref 0.0–0.3)
Indirect Bilirubin: 0.3 mg/dL (ref 0.2–1.2)
Total Bilirubin: 0.4 mg/dL (ref 0.2–1.2)
Total Protein: 5.9 g/dL — ABNORMAL LOW (ref 6.0–8.3)

## 2013-08-03 LAB — CBC WITH DIFFERENTIAL/PLATELET
BASOS ABS: 0 10*3/uL (ref 0.0–0.1)
Basophils Relative: 0 % (ref 0–1)
Eosinophils Absolute: 0.1 10*3/uL (ref 0.0–0.7)
Eosinophils Relative: 1 % (ref 0–5)
HCT: 30 % — ABNORMAL LOW (ref 36.0–46.0)
Hemoglobin: 9 g/dL — ABNORMAL LOW (ref 12.0–15.0)
LYMPHS PCT: 7 % — AB (ref 12–46)
Lymphs Abs: 0.9 10*3/uL (ref 0.7–4.0)
MCH: 22 pg — ABNORMAL LOW (ref 26.0–34.0)
MCHC: 30 g/dL (ref 30.0–36.0)
MCV: 73.3 fL — ABNORMAL LOW (ref 78.0–100.0)
Monocytes Absolute: 1 10*3/uL (ref 0.1–1.0)
Monocytes Relative: 8 % (ref 3–12)
NEUTROS ABS: 10.6 10*3/uL — AB (ref 1.7–7.7)
NEUTROS PCT: 84 % — AB (ref 43–77)
PLATELETS: 253 10*3/uL (ref 150–400)
RBC: 4.09 MIL/uL (ref 3.87–5.11)
RDW: 18.6 % — ABNORMAL HIGH (ref 11.5–15.5)
WBC: 12.6 10*3/uL — AB (ref 4.0–10.5)

## 2013-08-03 LAB — BASIC METABOLIC PANEL WITH GFR
BUN: 20 mg/dL (ref 6–23)
CALCIUM: 8.5 mg/dL (ref 8.4–10.5)
CO2: 26 mEq/L (ref 19–32)
CREATININE: 0.86 mg/dL (ref 0.50–1.10)
Chloride: 100 mEq/L (ref 96–112)
GFR, EST AFRICAN AMERICAN: 84 mL/min
GFR, Est Non African American: 73 mL/min
Glucose, Bld: 113 mg/dL — ABNORMAL HIGH (ref 70–99)
Potassium: 3.5 mEq/L (ref 3.5–5.3)
Sodium: 139 mEq/L (ref 135–145)

## 2013-08-03 LAB — BRAIN NATRIURETIC PEPTIDE: BRAIN NATRIURETIC PEPTIDE: 79 pg/mL (ref 0.0–100.0)

## 2013-08-03 MED ORDER — MINOCYCLINE HCL 100 MG PO CAPS: 100.0000 mg | ORAL_CAPSULE | Freq: Two times a day (BID) | ORAL | Status: DC

## 2013-08-03 NOTE — Progress Notes (Addendum)
Subjective:    Patient ID: Jacqueline Washington, female    DOB: [birth year], 63 y.o.   MRN: [id number]  HPI Comments: 63 yo female comes in for left sided chest pain only when she coughs. She notes she took two pain pills this morning and it helps. She did note strain last week with extra use, she denies actual injury. She has been coughing up yellow production x 3 days and sleeping more. She has lost about 20# in fluid with sitting up and being more active, she notes this has helped with breathing. She thinks increased activity has made her chest sore. SHE REFUSES ER!  She has noticed mild rash under her left breast x couple of days. She notes the rash comes and goes and improves with antifungal cream.   Shortness of Breath Associated symptoms include abdominal pain and chest pain.  Cough Associated symptoms include chest pain and shortness of breath.  Abdominal Pain   Current Outpatient Prescriptions on File Prior to Visit  Medication Sig Dispense Refill  . albuterol (PROVENTIL) (2.5 MG/3ML) 0.083% nebulizer solution Take 3 mLs (2.5 mg total) by nebulization 4 (four) times daily. DX 496  360 mL  6  . allopurinol (ZYLOPRIM) 100 MG tablet Take 100 mg by mouth daily.      . cetirizine (ZYRTEC) 10 MG tablet Take 10 mg by mouth daily.      . Fluticasone-Salmeterol (ADVAIR) 500-50 MCG/DOSE AEPB Inhale 1 puff into the lungs 2 (two) times daily.      . furosemide (LASIX) 40 MG tablet Take 40 mg by mouth daily.       Marland Kitchen HYDROcodone-acetaminophen (NORCO/VICODIN) 5-325 MG per tablet Take 1 tablet by mouth every 6 (six) hours as needed.  90 tablet  0  . ipratropium (ATROVENT) 0.02 % nebulizer solution Take 2.5 mLs (0.5 mg total) by nebulization every 6 (six) hours as needed for wheezing or shortness of breath. Dx 496  300 mL  12  . leflunomide (ARAVA) 20 MG tablet Take 20 mg by mouth daily.      . montelukast (SINGULAIR) 10 MG tablet Take 1 tablet by mouth daily.      Marland Kitchen nystatin cream (MYCOSTATIN) Apply 1  application topically daily as needed (under breasts).      . pantoprazole (PROTONIX) 40 MG tablet Take 1 tablet (40 mg total) by mouth daily.  30 tablet  6  . potassium chloride SA (K-DUR,KLOR-CON) 20 MEQ tablet Take 60 mEq by mouth daily.      . predniSONE (DELTASONE) 20 MG tablet Reduce to 35 mg daily for 7days then reduce to 30mg  per day and stay  (use 20mg  and 5mg  pred tabs)  60 tablet  1  . predniSONE (DELTASONE) 20 MG tablet TAKE 1 TO 2 TABLETS DAILY AS DIRECTED UNTIL SEE DR WRIGHT  60 tablet  1  . predniSONE (DELTASONE) 5 MG tablet Use with 20mg  prednisone for taper as directed  60 tablet  6  . tiotropium (SPIRIVA HANDIHALER) 18 MCG inhalation capsule Place 1 capsule (18 mcg total) into inhaler and inhale daily.  30 capsule  12   No current facility-administered medications on file prior to visit.   Allergies  Allergen Reactions  . Chantix [Varenicline]   . Hctz [Hydrochlorothiazide]   . Penicillins Rash  . Sulfonamide Derivatives Rash  . Tetanus Toxoid Other (See Comments)    Knot on arm   Past Medical History  Diagnosis Date  . Asthma   . Emphysema  02 dependent 3L/min  . Emphysema   . Pneumonia   . Hypothyroidism   . Diabetes mellitus, type II   . Obesity   . Diastolic heart failure 09/22/2012    Grade 1  . Emphysema   . Rheumatoid arthritis(714.0)   . Hyperlipidemia   . Hypertension   . Vitamin D deficiency       Review of Systems  Constitutional: Positive for fatigue.  Respiratory: Positive for cough and shortness of breath.   Cardiovascular: Positive for chest pain.       CHEST WALL  Gastrointestinal: Positive for abdominal pain.  All other systems reviewed and are negative.   Pulse 130  Temp(Src) 98 F (36.7 C) (Temporal)  Resp 20  SpO2 85% UNable to get BP after several attempts patient notes 120/65 at home    Objective:   Physical Exam  Nursing note and vitals reviewed. Constitutional: She is oriented to person, place, and time. She appears  well-developed and well-nourished. No distress.  Mildly labored breathing at beginning of exam but improved with d/c talking.   HENT:  Head: Normocephalic and atraumatic.  Right Ear: External ear normal.  Left Ear: External ear normal.  Nose: Nose normal.  Mouth/Throat: Oropharynx is clear and moist.  Eyes: Conjunctivae and EOM are normal.  Neck: Normal range of motion. Neck supple. No JVD present. No thyromegaly present.  Cardiovascular: Normal rate, regular rhythm, normal heart sounds and intact distal pulses.   Pulmonary/Chest: Effort normal.  ? Rhonchi LLL Negative tenderness with palpation  Abdominal: Soft. Bowel sounds are normal. She exhibits no distension and no mass. There is no tenderness. There is no rebound and no guarding.  Musculoskeletal: Normal range of motion. She exhibits no edema and no tenderness.  Wheel chair  Lymphadenopathy:    She has no cervical adenopathy.  Neurological: She is alert and oriented to person, place, and time. No cranial nerve deficit.  Skin: Skin is warm and dry. No rash noted. No erythema. No pallor.  Left lateral under breast mild erythema patches, NO BULLA  Psychiatric: She has a normal mood and affect. Her behavior is normal. Judgment and thought content normal.          Assessment & Plan:  1. SOB/ Dyspnea- ADVISED ER patient refuses. Check stat labs/ CXR/ ABD XRAY- Advised spouse if any increase ER. LONG discussion about possible risks and complications and possible other DIagnosis as Pneumonia vs heart but still declines ER. 2. Fatigue- check labs, increase activity and H2O 3. Yeast- Continue topical antifungal, w/c if SX increase, Hygiene explained

## 2013-08-03 NOTE — Patient Instructions (Signed)
Yeast Infection of the Skin Some yeast on the skin is normal, but sometimes it causes an infection. If you have a yeast infection, it shows up as white or light brown patches on brown skin. You can see it better in the summer on tan skin. It causes light-colored holes in your suntan. It can happen on any area of the body. This cannot be passed from person to person. HOME CARE  Scrub your skin daily with a dandruff shampoo. Your rash may take a couple weeks to get well.  Do not scratch or itch the rash. GET HELP RIGHT AWAY IF:   You get another infection from scratching. The skin may get warm, red, and may ooze fluid.  The infection does not seem to be getting better. MAKE SURE YOU:  Understand these instructions.  Will watch your condition.  Will get help right away if you are not doing well or get worse. Document Released: 05/15/2008 Document Revised: 08/25/2011 Document Reviewed: 05/15/2008 [facility] Patient Information 2014 [place], [place]. Shortness of Breath Shortness of breath means you have trouble breathing. Shortness of breath needs medical care right away. HOME CARE   Do not smoke.  Avoid being around chemicals or things (paint fumes, dust) that may bother your breathing.  Rest as needed. Slowly begin your normal activities.  Only take medicines as told by your doctor.  Keep all doctor visits as told. GET HELP RIGHT AWAY IF:   Your shortness of breath gets worse.  You feel lightheaded, pass out (faint), or have a cough that is not helped by medicine.  You cough up blood.  You have pain with breathing.  You have pain in your chest, arms, shoulders, or belly (abdomen).  You have a fever.  You cannot walk up stairs or exercise the way you normally do.  You do not get better in the time expected.  You have a hard time doing normal activities even with rest.  You have problems with your medicines.  You have any new symptoms. MAKE SURE YOU:  Understand  these instructions.  Will watch your condition.  Will get help right away if you are not doing well or get worse. Document Released: 11/19/2007 Document Revised: 12/02/2011 Document Reviewed: 08/18/2011 [facility] Patient Information 2014 [place], [place].

## 2013-08-04 ENCOUNTER — Encounter: Payer: Self-pay | Admitting: Gastroenterology

## 2013-08-04 ENCOUNTER — Other Ambulatory Visit: Payer: Self-pay | Admitting: Emergency Medicine

## 2013-08-04 DIAGNOSIS — D649 Anemia, unspecified: Secondary | ICD-10-CM

## 2013-08-04 LAB — IRON AND TIBC
%SAT: 5 % — ABNORMAL LOW (ref 20–55)
Iron: 15 ug/dL — ABNORMAL LOW (ref 42–145)
TIBC: 287 ug/dL (ref 250–470)
UIBC: 272 ug/dL (ref 125–400)

## 2013-08-04 LAB — VITAMIN B12: Vitamin B-12: 901 pg/mL (ref 211–911)

## 2013-08-08 ENCOUNTER — Ambulatory Visit (HOSPITAL_COMMUNITY): Payer: Medicare Other

## 2013-08-09 ENCOUNTER — Ambulatory Visit: Payer: Medicare Other | Admitting: Gastroenterology

## 2013-08-10 ENCOUNTER — Telehealth: Payer: Self-pay

## 2013-08-10 ENCOUNTER — Emergency Department (HOSPITAL_COMMUNITY): Payer: Medicare Other

## 2013-08-10 ENCOUNTER — Encounter (HOSPITAL_COMMUNITY): Payer: Self-pay | Admitting: Emergency Medicine

## 2013-08-10 ENCOUNTER — Ambulatory Visit (HOSPITAL_COMMUNITY): Payer: Medicare Other

## 2013-08-10 ENCOUNTER — Inpatient Hospital Stay (HOSPITAL_COMMUNITY)
Admission: EM | Admit: 2013-08-10 | Discharge: 2013-08-16 | DRG: 193 | Disposition: A | Discharge status: Home Health | Payer: Medicare Other | Attending: Family Medicine | Admitting: Family Medicine

## 2013-08-10 DIAGNOSIS — E785 Hyperlipidemia, unspecified: Secondary | ICD-10-CM

## 2013-08-10 DIAGNOSIS — R0603 Acute respiratory distress: Secondary | ICD-10-CM

## 2013-08-10 DIAGNOSIS — Z882 Allergy status to sulfonamides status: Secondary | ICD-10-CM

## 2013-08-10 DIAGNOSIS — I5032 Chronic diastolic (congestive) heart failure: Secondary | ICD-10-CM | POA: Diagnosis present

## 2013-08-10 DIAGNOSIS — E039 Hypothyroidism, unspecified: Secondary | ICD-10-CM | POA: Diagnosis present

## 2013-08-10 DIAGNOSIS — J479 Bronchiectasis, uncomplicated: Secondary | ICD-10-CM | POA: Diagnosis present

## 2013-08-10 DIAGNOSIS — E119 Type 2 diabetes mellitus without complications: Secondary | ICD-10-CM | POA: Diagnosis present

## 2013-08-10 DIAGNOSIS — D638 Anemia in other chronic diseases classified elsewhere: Secondary | ICD-10-CM | POA: Diagnosis present

## 2013-08-10 DIAGNOSIS — R7309 Other abnormal glucose: Secondary | ICD-10-CM

## 2013-08-10 DIAGNOSIS — J441 Chronic obstructive pulmonary disease with (acute) exacerbation: Secondary | ICD-10-CM

## 2013-08-10 DIAGNOSIS — B449 Aspergillosis, unspecified: Secondary | ICD-10-CM | POA: Diagnosis present

## 2013-08-10 DIAGNOSIS — E559 Vitamin D deficiency, unspecified: Secondary | ICD-10-CM

## 2013-08-10 DIAGNOSIS — B441 Other pulmonary aspergillosis: Secondary | ICD-10-CM

## 2013-08-10 DIAGNOSIS — J9621 Acute and chronic respiratory failure with hypoxia: Secondary | ICD-10-CM

## 2013-08-10 DIAGNOSIS — I5033 Acute on chronic diastolic (congestive) heart failure: Secondary | ICD-10-CM | POA: Diagnosis present

## 2013-08-10 DIAGNOSIS — R0902 Hypoxemia: Secondary | ICD-10-CM

## 2013-08-10 DIAGNOSIS — R600 Localized edema: Secondary | ICD-10-CM

## 2013-08-10 DIAGNOSIS — Z79899 Other long term (current) drug therapy: Secondary | ICD-10-CM

## 2013-08-10 DIAGNOSIS — J9620 Acute and chronic respiratory failure, unspecified whether with hypoxia or hypercapnia: Secondary | ICD-10-CM

## 2013-08-10 DIAGNOSIS — M0510 Rheumatoid lung disease with rheumatoid arthritis of unspecified site: Secondary | ICD-10-CM | POA: Diagnosis present

## 2013-08-10 DIAGNOSIS — J189 Pneumonia, unspecified organism: Principal | ICD-10-CM | POA: Diagnosis present

## 2013-08-10 DIAGNOSIS — Z515 Encounter for palliative care: Secondary | ICD-10-CM

## 2013-08-10 DIAGNOSIS — J9610 Chronic respiratory failure, unspecified whether with hypoxia or hypercapnia: Secondary | ICD-10-CM

## 2013-08-10 DIAGNOSIS — IMO0002 Reserved for concepts with insufficient information to code with codable children: Secondary | ICD-10-CM

## 2013-08-10 DIAGNOSIS — Z791 Long term (current) use of non-steroidal anti-inflammatories (NSAID): Secondary | ICD-10-CM

## 2013-08-10 DIAGNOSIS — E43 Unspecified severe protein-calorie malnutrition: Secondary | ICD-10-CM

## 2013-08-10 DIAGNOSIS — R918 Other nonspecific abnormal finding of lung field: Secondary | ICD-10-CM

## 2013-08-10 DIAGNOSIS — Z833 Family history of diabetes mellitus: Secondary | ICD-10-CM

## 2013-08-10 DIAGNOSIS — E1129 Type 2 diabetes mellitus with other diabetic kidney complication: Secondary | ICD-10-CM | POA: Diagnosis present

## 2013-08-10 DIAGNOSIS — E669 Obesity, unspecified: Secondary | ICD-10-CM

## 2013-08-10 DIAGNOSIS — Z825 Family history of asthma and other chronic lower respiratory diseases: Secondary | ICD-10-CM

## 2013-08-10 DIAGNOSIS — Z87891 Personal history of nicotine dependence: Secondary | ICD-10-CM

## 2013-08-10 DIAGNOSIS — I10 Essential (primary) hypertension: Secondary | ICD-10-CM | POA: Diagnosis present

## 2013-08-10 DIAGNOSIS — J449 Chronic obstructive pulmonary disease, unspecified: Secondary | ICD-10-CM

## 2013-08-10 DIAGNOSIS — Z880 Allergy status to penicillin: Secondary | ICD-10-CM

## 2013-08-10 DIAGNOSIS — M069 Rheumatoid arthritis, unspecified: Secondary | ICD-10-CM | POA: Diagnosis present

## 2013-08-10 DIAGNOSIS — Z9981 Dependence on supplemental oxygen: Secondary | ICD-10-CM

## 2013-08-10 DIAGNOSIS — T380X5A Adverse effect of glucocorticoids and synthetic analogues, initial encounter: Secondary | ICD-10-CM | POA: Diagnosis present

## 2013-08-10 DIAGNOSIS — K219 Gastro-esophageal reflux disease without esophagitis: Secondary | ICD-10-CM

## 2013-08-10 DIAGNOSIS — J8410 Pulmonary fibrosis, unspecified: Secondary | ICD-10-CM | POA: Diagnosis present

## 2013-08-10 DIAGNOSIS — Z66 Do not resuscitate: Secondary | ICD-10-CM

## 2013-08-10 DIAGNOSIS — I509 Heart failure, unspecified: Secondary | ICD-10-CM | POA: Diagnosis present

## 2013-08-10 DIAGNOSIS — Z8249 Family history of ischemic heart disease and other diseases of the circulatory system: Secondary | ICD-10-CM

## 2013-08-10 DIAGNOSIS — F411 Generalized anxiety disorder: Secondary | ICD-10-CM | POA: Diagnosis present

## 2013-08-10 DIAGNOSIS — A4902 Methicillin resistant Staphylococcus aureus infection, unspecified site: Secondary | ICD-10-CM | POA: Diagnosis present

## 2013-08-10 DIAGNOSIS — R5381 Other malaise: Secondary | ICD-10-CM

## 2013-08-10 DIAGNOSIS — Z887 Allergy status to serum and vaccine status: Secondary | ICD-10-CM

## 2013-08-10 DIAGNOSIS — Z888 Allergy status to other drugs, medicaments and biological substances status: Secondary | ICD-10-CM

## 2013-08-10 DIAGNOSIS — J4489 Other specified chronic obstructive pulmonary disease: Secondary | ICD-10-CM | POA: Diagnosis present

## 2013-08-10 LAB — PRO B NATRIURETIC PEPTIDE: Pro B Natriuretic peptide (BNP): 1099 pg/mL — ABNORMAL HIGH (ref 0–125)

## 2013-08-10 LAB — BASIC METABOLIC PANEL
BUN: 14 mg/dL (ref 6–23)
CHLORIDE: 94 meq/L — AB (ref 96–112)
CO2: 27 mEq/L (ref 19–32)
Calcium: 9.3 mg/dL (ref 8.4–10.5)
Creatinine, Ser: 0.82 mg/dL (ref 0.50–1.10)
GFR calc Af Amer: 87 mL/min — ABNORMAL LOW (ref >90)
GFR calc non Af Amer: 75 mL/min — ABNORMAL LOW (ref >90)
GLUCOSE: 204 mg/dL — AB (ref 70–99)
Potassium: 4.4 mEq/L (ref 3.7–5.3)
Sodium: 138 mEq/L (ref 137–147)

## 2013-08-10 LAB — BLOOD GAS, VENOUS
Acid-Base Excess: 4.9 mmol/L — ABNORMAL HIGH (ref 0.0–2.0)
Bicarbonate: 29.7 mEq/L — ABNORMAL HIGH (ref 20.0–24.0)
O2 Saturation: 66 %
PO2 VEN: 42 mmHg (ref 30.0–45.0)
Patient temperature: 98.6
TCO2: 28.1 mmol/L (ref 0–100)
pCO2, Ven: 47.9 mmHg (ref 45.0–50.0)
pH, Ven: 7.409 — ABNORMAL HIGH (ref 7.250–7.300)

## 2013-08-10 LAB — CBC WITH DIFFERENTIAL/PLATELET
Basophils Absolute: 0 10*3/uL (ref 0.0–0.1)
Basophils Relative: 0 % (ref 0–1)
EOS ABS: 0 10*3/uL (ref 0.0–0.7)
Eosinophils Relative: 0 % (ref 0–5)
HCT: 28.7 % — ABNORMAL LOW (ref 36.0–46.0)
HEMOGLOBIN: 7.8 g/dL — AB (ref 12.0–15.0)
LYMPHS ABS: 1.4 10*3/uL (ref 0.7–4.0)
Lymphocytes Relative: 9 % — ABNORMAL LOW (ref 12–46)
MCH: 21 pg — AB (ref 26.0–34.0)
MCHC: 27.2 g/dL — AB (ref 30.0–36.0)
MCV: 77.4 fL — AB (ref 78.0–100.0)
MONOS PCT: 4 % (ref 3–12)
Monocytes Absolute: 0.7 10*3/uL (ref 0.1–1.0)
Neutro Abs: 13.9 10*3/uL — ABNORMAL HIGH (ref 1.7–7.7)
Neutrophils Relative %: 87 % — ABNORMAL HIGH (ref 43–77)
Platelets: 303 10*3/uL (ref 150–400)
RBC: 3.71 MIL/uL — AB (ref 3.87–5.11)
RDW: 18.7 % — ABNORMAL HIGH (ref 11.5–15.5)
WBC: 16 10*3/uL — ABNORMAL HIGH (ref 4.0–10.5)

## 2013-08-10 LAB — CREATININE, SERUM
Creatinine, Ser: 0.82 mg/dL (ref 0.50–1.10)
GFR, EST AFRICAN AMERICAN: 87 mL/min — AB (ref >90)
GFR, EST NON AFRICAN AMERICAN: 75 mL/min — AB (ref >90)

## 2013-08-10 LAB — PROTEIN, TOTAL: Total Protein: 7.1 g/dL (ref 6.0–8.3)

## 2013-08-10 LAB — PROCALCITONIN: Procalcitonin: 0.7 ng/mL

## 2013-08-10 LAB — LACTATE DEHYDROGENASE: LDH: 350 U/L — ABNORMAL HIGH (ref 94–250)

## 2013-08-10 LAB — TROPONIN I

## 2013-08-10 LAB — I-STAT CG4 LACTIC ACID, ED: LACTIC ACID, VENOUS: 3.24 mmol/L — AB (ref 0.5–2.2)

## 2013-08-10 MED ORDER — VANCOMYCIN HCL 10 G IV SOLR
2000.0000 mg | Freq: Once | INTRAVENOUS | Status: AC
  Administered 2013-08-10: 2000 mg via INTRAVENOUS
  Filled 2013-08-10: qty 2000

## 2013-08-10 MED ORDER — ALLOPURINOL 100 MG PO TABS
100.0000 mg | ORAL_TABLET | Freq: Every day | ORAL | Status: DC
  Administered 2013-08-11: 100 mg via ORAL
  Filled 2013-08-10: qty 1

## 2013-08-10 MED ORDER — ALBUTEROL SULFATE (2.5 MG/3ML) 0.083% IN NEBU: 2.5000 mg | INHALATION_SOLUTION | Freq: Four times a day (QID) | RESPIRATORY_TRACT | Status: DC

## 2013-08-10 MED ORDER — PANTOPRAZOLE SODIUM 40 MG PO TBEC
40.0000 mg | DELAYED_RELEASE_TABLET | Freq: Every day | ORAL | Status: DC
  Administered 2013-08-11 – 2013-08-16 (×6): 40 mg via ORAL
  Filled 2013-08-10 (×6): qty 1

## 2013-08-10 MED ORDER — LEVOFLOXACIN 750 MG PO TABS
750.0000 mg | ORAL_TABLET | Freq: Every day | ORAL | Status: DC
  Administered 2013-08-11 – 2013-08-16 (×6): 750 mg via ORAL
  Filled 2013-08-10 (×6): qty 1

## 2013-08-10 MED ORDER — LEVOFLOXACIN IN D5W 750 MG/150ML IV SOLN: 750.0000 mg | Freq: Once | INTRAVENOUS | Status: DC

## 2013-08-10 MED ORDER — MONTELUKAST SODIUM 10 MG PO TABS
10.0000 mg | ORAL_TABLET | Freq: Every day | ORAL | Status: DC
  Administered 2013-08-11: 10 mg via ORAL
  Filled 2013-08-10: qty 1

## 2013-08-10 MED ORDER — VANCOMYCIN HCL 10 G IV SOLR
1250.0000 mg | Freq: Two times a day (BID) | INTRAVENOUS | Status: DC
  Administered 2013-08-11: 1250 mg via INTRAVENOUS
  Filled 2013-08-10 (×2): qty 1250

## 2013-08-10 MED ORDER — ALBUTEROL SULFATE (2.5 MG/3ML) 0.083% IN NEBU
2.5000 mg | INHALATION_SOLUTION | RESPIRATORY_TRACT | Status: DC
  Administered 2013-08-11 (×2): 2.5 mg via RESPIRATORY_TRACT
  Filled 2013-08-10: qty 3

## 2013-08-10 MED ORDER — ALBUTEROL SULFATE (2.5 MG/3ML) 0.083% IN NEBU
2.5000 mg | INHALATION_SOLUTION | RESPIRATORY_TRACT | Status: DC
  Administered 2013-08-10: 2.5 mg via RESPIRATORY_TRACT
  Filled 2013-08-10: qty 3

## 2013-08-10 MED ORDER — IPRATROPIUM BROMIDE 0.02 % IN SOLN: 0.5000 mg | Freq: Four times a day (QID) | RESPIRATORY_TRACT | Status: DC | PRN

## 2013-08-10 MED ORDER — TIOTROPIUM BROMIDE MONOHYDRATE 18 MCG IN CAPS
18.0000 ug | ORAL_CAPSULE | Freq: Every day | RESPIRATORY_TRACT | Status: DC
  Administered 2013-08-11 – 2013-08-16 (×6): 18 ug via RESPIRATORY_TRACT
  Filled 2013-08-10 (×3): qty 5

## 2013-08-10 MED ORDER — SODIUM CHLORIDE 0.9 % IV SOLN
250.0000 mL | INTRAVENOUS | Status: DC | PRN
  Administered 2013-08-13: 250 mL via INTRAVENOUS

## 2013-08-10 MED ORDER — SODIUM CHLORIDE 0.9 % IV BOLUS (SEPSIS)
500.0000 mL | Freq: Once | INTRAVENOUS | Status: AC
  Administered 2013-08-10: 500 mL via INTRAVENOUS

## 2013-08-10 MED ORDER — SODIUM CHLORIDE 0.9 % IV BOLUS (SEPSIS)
250.0000 mL | Freq: Once | INTRAVENOUS | Status: DC
Start: 2013-08-10 — End: 2013-08-10

## 2013-08-10 MED ORDER — METHYLPREDNISOLONE SODIUM SUCC 125 MG IJ SOLR: 80.0000 mg | Freq: Four times a day (QID) | INTRAMUSCULAR | Status: DC

## 2013-08-10 MED ORDER — DEXTROSE 5 % IV SOLN
2.0000 g | Freq: Three times a day (TID) | INTRAVENOUS | Status: DC
  Administered 2013-08-11: 2 g via INTRAVENOUS
  Filled 2013-08-10 (×3): qty 2

## 2013-08-10 MED ORDER — FUROSEMIDE 40 MG PO TABS
40.0000 mg | ORAL_TABLET | Freq: Every day | ORAL | Status: DC
  Administered 2013-08-11 – 2013-08-16 (×6): 40 mg via ORAL
  Filled 2013-08-10 (×6): qty 1

## 2013-08-10 MED ORDER — MINOCYCLINE HCL 100 MG PO CAPS
100.0000 mg | ORAL_CAPSULE | Freq: Two times a day (BID) | ORAL | Status: DC
  Administered 2013-08-11: 100 mg via ORAL
  Filled 2013-08-10 (×2): qty 1

## 2013-08-10 MED ORDER — ENOXAPARIN SODIUM 40 MG/0.4ML ~~LOC~~ SOLN
40.0000 mg | SUBCUTANEOUS | Status: DC
  Administered 2013-08-11 – 2013-08-16 (×6): 40 mg via SUBCUTANEOUS
  Filled 2013-08-10 (×6): qty 0.4

## 2013-08-10 MED ORDER — IPRATROPIUM BROMIDE 0.02 % IN SOLN
1.0000 mg | Freq: Once | RESPIRATORY_TRACT | Status: AC
  Administered 2013-08-10: 1 mg via RESPIRATORY_TRACT
  Filled 2013-08-10: qty 5

## 2013-08-10 MED ORDER — SODIUM CHLORIDE 0.9 % IJ SOLN
3.0000 mL | Freq: Two times a day (BID) | INTRAMUSCULAR | Status: DC
  Administered 2013-08-10 – 2013-08-13 (×3): 3 mL via INTRAVENOUS
  Administered 2013-08-14: 10 mL via INTRAVENOUS
  Administered 2013-08-15 – 2013-08-16 (×3): 3 mL via INTRAVENOUS

## 2013-08-10 MED ORDER — DEXTROSE 5 % IV SOLN
2.0000 g | Freq: Once | INTRAVENOUS | Status: AC
  Administered 2013-08-10: 2 g via INTRAVENOUS

## 2013-08-10 MED ORDER — ALBUTEROL (5 MG/ML) CONTINUOUS INHALATION SOLN
20.0000 mg | INHALATION_SOLUTION | Freq: Once | RESPIRATORY_TRACT | Status: AC
  Administered 2013-08-10: 20 mg via RESPIRATORY_TRACT
  Filled 2013-08-10 (×2): qty 24

## 2013-08-10 MED ORDER — METHYLPREDNISOLONE SODIUM SUCC 125 MG IJ SOLR
80.0000 mg | Freq: Four times a day (QID) | INTRAMUSCULAR | Status: DC
  Filled 2013-08-10: qty 2

## 2013-08-10 MED ORDER — LEFLUNOMIDE 20 MG PO TABS
20.0000 mg | ORAL_TABLET | Freq: Every day | ORAL | Status: DC
  Administered 2013-08-11 – 2013-08-16 (×6): 20 mg via ORAL
  Filled 2013-08-10 (×7): qty 1

## 2013-08-10 MED ORDER — INSULIN ASPART 100 UNIT/ML ~~LOC~~ SOLN
0.0000 [IU] | Freq: Three times a day (TID) | SUBCUTANEOUS | Status: DC
  Administered 2013-08-11: 2 [IU] via SUBCUTANEOUS
  Administered 2013-08-12: 1 [IU] via SUBCUTANEOUS
  Administered 2013-08-12 – 2013-08-13 (×3): 2 [IU] via SUBCUTANEOUS
  Administered 2013-08-13 – 2013-08-15 (×4): 1 [IU] via SUBCUTANEOUS
  Administered 2013-08-16: 2 [IU] via SUBCUTANEOUS

## 2013-08-10 MED ORDER — HYDROCODONE-ACETAMINOPHEN 5-325 MG PO TABS
1.0000 | ORAL_TABLET | Freq: Four times a day (QID) | ORAL | Status: DC | PRN
  Administered 2013-08-10 – 2013-08-14 (×5): 1 via ORAL
  Filled 2013-08-10 (×5): qty 1

## 2013-08-10 MED ORDER — SODIUM CHLORIDE 0.9 % IJ SOLN: 3.0000 mL | INTRAMUSCULAR | Status: DC | PRN

## 2013-08-10 MED ORDER — METHYLPREDNISOLONE SODIUM SUCC 125 MG IJ SOLR
80.0000 mg | Freq: Two times a day (BID) | INTRAMUSCULAR | Status: DC
Start: 2013-08-10 — End: 2013-08-11
  Administered 2013-08-10 – 2013-08-11 (×2): 80 mg via INTRAVENOUS
  Filled 2013-08-10 (×2): qty 1.28

## 2013-08-10 MED ORDER — METHYLPREDNISOLONE SODIUM SUCC 125 MG IJ SOLR
125.0000 mg | Freq: Once | INTRAMUSCULAR | Status: AC
  Administered 2013-08-10: 125 mg via INTRAVENOUS
  Filled 2013-08-10: qty 2

## 2013-08-10 MED ORDER — POTASSIUM CHLORIDE CRYS ER 20 MEQ PO TBCR
60.0000 meq | EXTENDED_RELEASE_TABLET | Freq: Every day | ORAL | Status: DC
  Administered 2013-08-11 – 2013-08-16 (×6): 60 meq via ORAL
  Filled 2013-08-10 (×6): qty 3

## 2013-08-10 NOTE — ED Notes (Signed)
Blood culture (1 set) obtained from left wrist.  Attempts to draw 2nd set of blood culture unsuccessful d/t poor peripheral veins; admitting MD was made aware.  1st set of blood culture was obtained after 1st dose of antibiotic (Azactam)--- MD was also made aware of this.

## 2013-08-10 NOTE — Consult Note (Signed)
Name: Jacqueline Washington MRN: [id number] DOB: 05-28-1951    ADMISSION DATE:  08/10/2013 CONSULTATION DATE:  2/25  REFERRING MD :  Micheline Maze  PRIMARY SERVICE:  [facility] MD Azzie Roup.    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 Presented to ER on 2/25 w/ essentially 4 weeks of progressive dyspnea w/ evolving pleuritic type CP on left. PCCM asked to assist w/ care.    SIGNIFICANT EVENTS / STUDIES:  CT chest 2/25: severe chronic lung disease with emphysema, pulmonary fibrosis, traction bronchiectasis and multiple lung cavities. There is an enlarging left-sided pleural effusion with overlying atelectasis. There is also new airspace opacity in the left upper lobe posteriorly Left upper lobe cavitary lesion concerning for fungal bal.   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 Most recently seen in our office 1/22 at which time had exertional dyspnea getting around the house but no evidence of active infection. At that time she was on 40mg  of pred/day and the plan was to slowly wean this down. As of 2/25 she is currently on 30 /day. EMR review: pt seen at PCP 2/18 w/ CC: productive cough w/ associated left sided pleuritic CP (with cough), at this point she  had refused ER eval. Really she has had significant persistent and progressive exertional dyspnea preceding her visit on 2/18, this has been associated w/ pleuritic type CP, and productive cough w/ yellow sputum. She denies fevers, chills, or sig wheezing. Does endorse LE swelling. On 2/25 she called her PCP w/ CC: increasing SOB, resting O2 sats 66% on 4 liters and was instructed to report to the ER. CT of chest was obtained that showed new left effusion, as well as new LUL airspace disease and possibly some worsening of RUL. PCCM was called to assist with her care.   PAST MEDICAL HISTORY :  Past Medical History  Diagnosis Date  . Asthma   . Emphysema     02 dependent 3L/min  . Emphysema   . Pneumonia   . Hypothyroidism   . Diabetes mellitus, type II   . Obesity   . Diastolic heart failure 09/22/2012    Grade 1  . Emphysema   . Rheumatoid arthritis(714.0)   . Hyperlipidemia   . Hypertension   . Vitamin D deficiency    Past Surgical History  Procedure Laterality Date  . Tubal ligation    . Colonoscopy    . Video bronchoscopy N/A 11/18/2012    Procedure: VIDEO BRONCHOSCOPY WITH FLUORO;  Surgeon: Storm Frisk, MD;  Location: [facility];  Service: Cardiopulmonary;  Laterality: N/A;  . Eye surgery  at age 66   Prior to Admission medications   Medication Sig Start Date End Date Taking? Authorizing Provider  albuterol (PROVENTIL) (2.5 MG/3ML) 0.083% nebulizer solution Take 3 mLs (2.5 mg total) by nebulization 4 (four)  times daily. DX 496 06/23/13  Yes Storm Frisk, MD  allopurinol (ZYLOPRIM) 100 MG tablet Take 100 mg by mouth daily.   Yes Historical Provider, MD  cetirizine (ZYRTEC) 10 MG tablet Take 10 mg by mouth daily.   Yes Historical Provider, MD  Fluticasone-Salmeterol (ADVAIR) 500-50 MCG/DOSE AEPB Inhale 1 puff into the lungs 2 (two) times daily.   Yes Historical Provider, MD  furosemide (LASIX) 40 MG tablet Take 40 mg by mouth daily.    Yes Historical Provider, MD    HYDROcodone-acetaminophen (NORCO/VICODIN) 5-325 MG per tablet Take 1 tablet by mouth every 6 (six) hours as needed (pain). 07/07/13  Yes Storm Frisk, MD  ipratropium (ATROVENT) 0.02 % nebulizer solution Take 2.5 mLs (0.5 mg total) by nebulization every 6 (six) hours as needed for wheezing or shortness of breath. Dx 496 04/19/13  Yes Simonne Martinet, NP  leflunomide (ARAVA) 20 MG tablet Take 20 mg by mouth daily. 06/06/13  Yes Historical Provider, MD  minocycline (MINOCIN,DYNACIN) 100 MG capsule Take 1 capsule (100 mg total) by mouth 2 (two) times daily. 08/03/13  Yes Melissa R Smith, PA-C  montelukast (SINGULAIR) 10 MG tablet Take 1 tablet by mouth daily.   Yes Historical Provider, MD  nystatin cream (MYCOSTATIN) Apply 1 application topically daily as needed (under breasts).   Yes Historical Provider, MD  pantoprazole (PROTONIX) 40 MG tablet Take 1 tablet (40 mg total) by mouth daily. 04/19/13  Yes Simonne Martinet, NP  potassium chloride SA (K-DUR,KLOR-CON) 20 MEQ tablet Take 60 mEq by mouth daily. 10/26/12  Yes Laveda Norman, MD  predniSONE (DELTASONE) 20 MG tablet Reduce to 35 mg daily for 7days then reduce to 30mg  per day and stay  (use 20mg  and 5mg  pred tabs) 07/07/13  Yes , MD  tiotropium (SPIRIVA HANDIHALER) 18 MCG inhalation capsule Place 1 capsule (18 mcg total) into inhaler and inhale daily. 04/19/13  Yes 07/09/13, NP   Allergies  Allergen Reactions  . Chantix [Varenicline] Other (See Comments)    Unknown " blurry vision"  . Hctz [Hydrochlorothiazide] Other (See Comments)    unknown  . Penicillins Rash  . Sulfonamide Derivatives Rash  . Tetanus Toxoid Other (See Comments)    Knot on arm    FAMILY HISTORY:  Family History  Problem Relation Age of Onset  . Asthma Mother   . Heart disease Mother   . Clotting disorder Mother   . Heart attack Mother   . Diabetes Mother   . Hypertension Mother   . Cirrhosis Mother   . Colon cancer Neg Hx   . Heart attack  Father    SOCIAL HISTORY:  reports that she quit smoking about 6 years ago. Her smoking use included Cigarettes. She has a 129 pack-year smoking history. She has never used smokeless tobacco. She reports that she drinks alcohol. She reports that she does not use illicit drugs.  Review of Systems:   Bolds are positive  Constitutional: weight loss, gain, night sweats, Fevers, chills, fatigue .  HEENT: headaches, Sore throat, sneezing, nasal congestion, post nasal drip, Difficulty swallowing, Tooth/dental problems, visual complaints visual changes, ear ache CV:  chest pain, radiates: ,Orthopnea, PND, swelling in lower extremities, dizziness, palpitations, syncope.  GI  heartburn, indigestion, abdominal pain, nausea, vomiting, diarrhea, change in bowel habits, loss of appetite, bloody stools.  Resp: cough, productive: yellow sputum, hemoptysis, dyspnea, chest pain, pleuritic, on left .  Skin: rash or itching or icterus GU: dysuria, change  in color of urine, urgency or frequency. flank pain, hematuria  MS: joint pain or swelling. decreased range of motion  Psych: change in mood or affect. depression or anxiety.  Neuro: difficulty with speech, weakness, numbness, ataxia     SUBJECTIVE:  Not in acute distress VITAL SIGNS: Temp:  [98.2 F (36.8 C)] 98.2 F (36.8 C) (02/25 1551) Resp:  [28-34] 28 (02/25 1740) BP: (121-155)/(52-110) 121/52 mmHg (02/25 1740) SpO2:  [95 %-100 %] 95 % (02/25 1740)  PHYSICAL EXAMINATION: General:  Chronically ill appearing 63 year old female, not currently in acute distress  Neuro:  Awake, alert, no focal def  HEENT:  [place], no JVD, mmm  Cardiovascular:  rrr Lungs:  Exp wz, crackles bilateral bases.  Abdomen:  Obese + bowel sounds  Musculoskeletal:  Intact  Skin:  LE edema    Recent Labs Lab 08/10/13 1554  NA 138  K 4.4  CL 94*  CO2 27  BUN 14  CREATININE 0.82  GLUCOSE 204*    Recent Labs Lab 08/10/13 1554  HGB 7.8*  HCT 28.7*  WBC 16.0*    PLT 303   Ct Chest Wo Contrast  08/10/2013   CLINICAL DATA:  Left-sided chest pain.  Respiratory distress.  EXAM: CT CHEST WITHOUT CONTRAST  TECHNIQUE: Multidetector CT imaging of the chest was performed following the standard protocol without IV contrast.  COMPARISON:  03/24/2013  FINDINGS: The chest wall is unremarkable. No breast masses, supraclavicular or axillary lymphadenopathy. The bony thorax is intact. No destructive bone lesions or spinal canal compromise. Moderate degenerative changes noted in the thoracic spine and mild-to-moderate osteoporosis.  The heart is normal in size. No pericardial effusion. No mediastinal or hilar mass or adenopathy. Small scattered lymph nodes are stable. The aorta demonstrates scattered atherosclerotic calcifications. The esophagus is grossly normal.  Examination of the lung parenchyma demonstrates severe chronic lung disease with emphysema, pulmonary fibrosis, traction bronchiectasis and multiple lung cavities. There is an enlarging left-sided pleural effusion with overlying atelectasis. There is also new airspace opacity in the left upper lobe posteriorly which could be a superimposed pneumonia.  The upper abdomen is unremarkable.  IMPRESSION: Severe chronic lung disease as discussed above with suspected superimposed left upper lobe pneumonia.   Electronically Signed   By: Loralie Champagne M.D.   On: 08/10/2013 17:50   Dg Chest Port 1 View  08/10/2013   CLINICAL DATA:  resp distress  EXAM: PORTABLE CHEST - 1 VIEW  COMPARISON:  DG CHEST 2 VIEW dated 08/03/2013; DG CHEST 2 VIEW dated 10/23/2012; DG CHEST 1V PORT dated 03/22/2013  FINDINGS: The interstitial findings within the right and left lung apices are stable when compared to the previous study and when correlated with prior imaging. Hilar retraction is once again appreciated. No new focal regions of consolidation or new focal infiltrates appreciated. Cardiac silhouette is within normal limits. There is blunting of the  left costophrenic angle also stable there is prominence of the interstitial markings within the remaining aerated portions of the lungs which appears stable. No new focal regions of consolidation or new focal infiltrates.  IMPRESSION: Stable chronic changes within the right and left lung apices as described above and left lung base.   Electronically Signed   By: Salome Holmes M.D.   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 She has known prednisone responsive rheumatoid related pulmonary fibrosis, and it's hard to say currently if this is flare of her rheumatoid lung or pneumonia. As mentioned above it does appear she has a new fungal ball that was not present on previous CXR raising concern for aspergillus. The pleural effusion is small (not large enough to sample) and could either be r/t her RA OR parapneumonic process. Suspect that there is an element of volume overload as well.   DD here includes infectious - bacterial pneumonia (high risk pseudomonas due to chronic steroids), invasive aspergillosis possible Inflammatory - could be BOOP or rheumatoid lung  Recommendation  -agree w/ empiric abx- add aztreonam/ levaquin for pseudomnas coverage &  vanc for MRSA -send stat aspergillus galactomannan antigen (if positive will need to consider antifungal rx) -send sputum as well as sputum for fungal -Underwent  bronch in 11/2012 but not biopsied -so can consider -cycle PCT -pulse steroid 80mg  q 8 -lasix as bun/creat tol -re-image CXR in am, if worsening effusion , proceed with diagnostic thoracentesis -Will need PICC for IV access -DNR noted   Pulmonary and Critical Care Medicine [facility] Pager: [phone]  08/10/2013, 8:17 PM

## 2013-08-10 NOTE — ED Notes (Signed)
Pt called Dr. Oneta Rack who told her to come in today. SOB. Hx of COPD and emphysema. 66% 4L [place].

## 2013-08-10 NOTE — ED Notes (Signed)
Pt requesting pain medication.  

## 2013-08-10 NOTE — Telephone Encounter (Signed)
Kailegh from [facility] called. Pt has CT Chest sched and pre-cert location site is wrong and needs to be changed. Called [employer] and location site was changed. Pre-cert # is the same. Kailegh aware.

## 2013-08-10 NOTE — Progress Notes (Signed)
Notified IT consultant that patient has history of IR placed lines and that order needs to be placed for IR to place Central line.

## 2013-08-10 NOTE — ED Notes (Signed)
Dr Micheline Maze at bedside to attempt EJ.

## 2013-08-10 NOTE — ED Notes (Signed)
Pt refuses to let me even attempt to start an IV line; she is insisting to sent her to radiology to have PICC line placed. Dr Micheline Maze and myself explained to pt that we need to have IV access ASAP due to her condition. Pt is still adamant and refusing. Pt has agreed to let IV team attempt 1 one time. IV team paged.

## 2013-08-10 NOTE — H&P (Signed)
PCP:   Nadean Corwin, MD   Chief Complaint:  sob  HPI: 63 yo female h/o RA, pulm fibrosis chronically on 3 L o2 at home cont, diastolic chf, dm, obesity comes in with several weeks of worsening sob particularly over the last several days.  No fevers.  Coughing a lot.  She has been on a slow predisone taper with her pulmonologist and over a week ago went down from 40mg  pred a day to 35mg  a day.  She has chronic le edema which is no worse or better than normal.  No cp.  No hemoptysis.  No n/v/d.  Has not been on abx recently in at least a month.  Feeling some better since arrival to ED after iv solumedrol and multiple breathing treatments.  Review of Systems:  Positive and negative as per HPI otherwise all other systems are negative  Past Medical History: Past Medical History  Diagnosis Date  . Asthma   . Emphysema     02 dependent 3L/min  . Emphysema   . Pneumonia   . Hypothyroidism   . Diabetes mellitus, type II   . Obesity   . Diastolic heart failure 09/22/2012    Grade 1  . Emphysema   . Rheumatoid arthritis(714.0)   . Hyperlipidemia   . Hypertension   . Vitamin D deficiency    Past Surgical History  Procedure Laterality Date  . Tubal ligation    . Colonoscopy    . Video bronchoscopy N/A 11/18/2012    Procedure: VIDEO BRONCHOSCOPY WITH FLUORO;  Surgeon: Storm Frisk, MD;  Location: [facility];  Service: Cardiopulmonary;  Laterality: N/A;  . Eye surgery  at age 44    Medications: Prior to Admission medications   Medication Sig Start Date End Date Taking? Authorizing Provider  albuterol (PROVENTIL) (2.5 MG/3ML) 0.083% nebulizer solution Take 3 mLs (2.5 mg total) by nebulization 4 (four) times daily. DX 496 06/23/13  Yes Storm Frisk, MD  allopurinol (ZYLOPRIM) 100 MG tablet Take 100 mg by mouth daily.   Yes Historical Provider, MD  cetirizine (ZYRTEC) 10 MG tablet Take 10 mg by mouth daily.   Yes Historical Provider, MD  Fluticasone-Salmeterol (ADVAIR)  500-50 MCG/DOSE AEPB Inhale 1 puff into the lungs 2 (two) times daily.   Yes Historical Provider, MD  furosemide (LASIX) 40 MG tablet Take 40 mg by mouth daily.    Yes Historical Provider, MD  HYDROcodone-acetaminophen (NORCO/VICODIN) 5-325 MG per tablet Take 1 tablet by mouth every 6 (six) hours as needed (pain). 07/07/13  Yes Storm Frisk, MD  ipratropium (ATROVENT) 0.02 % nebulizer solution Take 2.5 mLs (0.5 mg total) by nebulization every 6 (six) hours as needed for wheezing or shortness of breath. Dx 496 04/19/13  Yes Simonne Martinet, NP  leflunomide (ARAVA) 20 MG tablet Take 20 mg by mouth daily. 06/06/13  Yes Historical Provider, MD  minocycline (MINOCIN,DYNACIN) 100 MG capsule Take 1 capsule (100 mg total) by mouth 2 (two) times daily. 08/03/13  Yes Melissa R Smith, PA-C  montelukast (SINGULAIR) 10 MG tablet Take 1 tablet by mouth daily.   Yes Historical Provider, MD  nystatin cream (MYCOSTATIN) Apply 1 application topically daily as needed (under breasts).   Yes Historical Provider, MD  pantoprazole (PROTONIX) 40 MG tablet Take 1 tablet (40 mg total) by mouth daily. 04/19/13  Yes Simonne Martinet, NP  potassium chloride SA (K-DUR,KLOR-CON) 20 MEQ tablet Take 60 mEq by mouth daily. 10/26/12  Yes Laveda Norman, MD  predniSONE (DELTASONE) 20 MG tablet Reduce to 35 mg daily for 7days then reduce to 30mg  per day and stay  (use 20mg  and 5mg  pred tabs) 07/07/13  Yes , MD  tiotropium (SPIRIVA HANDIHALER) 18 MCG inhalation capsule Place 1 capsule (18 mcg total) into inhaler and inhale daily. 04/19/13  Yes 07/09/13, NP    Allergies:   Allergies  Allergen Reactions  . Chantix [Varenicline] Other (See Comments)    Unknown " blurry vision"  . Hctz [Hydrochlorothiazide] Other (See Comments)    unknown  . Penicillins Rash  . Sulfonamide Derivatives Rash  . Tetanus Toxoid Other (See Comments)    Knot on arm    Social History:  reports that she quit smoking about 6 years ago.  Her smoking use included Cigarettes. She has a 129 pack-year smoking history. She has never used smokeless tobacco. She reports that she drinks alcohol. She reports that she does not use illicit drugs.  Family History: Family History  Problem Relation Age of Onset  . Asthma Mother   . Heart disease Mother   . Clotting disorder Mother   . Heart attack Mother   . Diabetes Mother   . Hypertension Mother   . Cirrhosis Mother   . Colon cancer Neg Hx   . Heart attack Father     Physical Exam: Filed Vitals:   08/10/13 1549 08/10/13 1551 08/10/13 1613 08/10/13 1740  BP: 155/110   121/52  Temp:  98.2 F (36.8 C)    TempSrc:  Oral    Resp:    28  SpO2:   100% 95%   General appearance: alert, cooperative and mild distress Head: Normocephalic, without obvious abnormality, atraumatic Eyes: negative Nose: Nares normal. Septum midline. Mucosa normal. No drainage or sinus tenderness. Neck: no JVD and supple, symmetrical, trachea midline Lungs: diminished breath sounds bilaterally Heart: regular rate and rhythm tachy no m/r/g Abdomen: soft, non-tender; bowel sounds normal; no masses,  no organomegaly Extremities: edema 1+ Pulses: 2+ and symmetric Skin: Skin color, texture, turgor normal. No rashes or lesions Neurologic: Grossly normal    Labs on Admission:   Recent Labs  08/10/13 1554  NA 138  K 4.4  CL 94*  CO2 27  GLUCOSE 204*  BUN 14  CREATININE 0.82  CALCIUM 9.3    Recent Labs  08/10/13 1554  WBC 16.0*  NEUTROABS 13.9*  HGB 7.8*  HCT 28.7*  MCV 77.4*  PLT 303    Recent Labs  08/10/13 1554  TROPONINI <0.30   Radiological Exams on Admission: Ct Chest Wo Contrast  08/10/2013   CLINICAL DATA:  Left-sided chest pain.  Respiratory distress.  EXAM: CT CHEST WITHOUT CONTRAST  TECHNIQUE: Multidetector CT imaging of the chest was performed following the standard protocol without IV contrast.  COMPARISON:  03/24/2013  FINDINGS: The chest wall is unremarkable. No  breast masses, supraclavicular or axillary lymphadenopathy. The bony thorax is intact. No destructive bone lesions or spinal canal compromise. Moderate degenerative changes noted in the thoracic spine and mild-to-moderate osteoporosis.  The heart is normal in size. No pericardial effusion. No mediastinal or hilar mass or adenopathy. Small scattered lymph nodes are stable. The aorta demonstrates scattered atherosclerotic calcifications. The esophagus is grossly normal.  Examination of the lung parenchyma demonstrates severe chronic lung disease with emphysema, pulmonary fibrosis, traction bronchiectasis and multiple lung cavities. There is an enlarging left-sided pleural effusion with overlying atelectasis. There is also new airspace opacity in the left upper lobe posteriorly which  could be a superimposed pneumonia.  The upper abdomen is unremarkable.  IMPRESSION: Severe chronic lung disease as discussed above with suspected superimposed left upper lobe pneumonia.   Electronically Signed   By: Loralie Champagne M.D.   On: 08/10/2013 17:50   Dg Chest Port 1 View  08/10/2013   CLINICAL DATA:  resp distress  EXAM: PORTABLE CHEST - 1 VIEW  COMPARISON:  DG CHEST 2 VIEW dated 08/03/2013; DG CHEST 2 VIEW dated 10/23/2012; DG CHEST 1V PORT dated 03/22/2013  FINDINGS: The interstitial findings within the right and left lung apices are stable when compared to the previous study and when correlated with prior imaging. Hilar retraction is once again appreciated. No new focal regions of consolidation or new focal infiltrates appreciated. Cardiac silhouette is within normal limits. There is blunting of the left costophrenic angle also stable there is prominence of the interstitial markings within the remaining aerated portions of the lungs which appears stable. No new focal regions of consolidation or new focal infiltrates.  IMPRESSION: Stable chronic changes within the right and left lung apices as described above and left lung  base.   Electronically Signed   By: Salome Holmes M.D.   On: 08/10/2013 16:32    Assessment/Plan  63 yo female with acute on chronic resp failure from copd/pulm fibrosis and superimposed hcap  Principal Problem:   HCAP (healthcare-associated pneumonia)-  Place on hcap broad spectrum abx.   Increase her steroids.  Cont nebs.  Oxygen.  Place in stepdown.  prob early sepsis.  Lung disease is moderate to severe.  PCCM also to see in consultation.    Active Problems:   Copd Gold C    Chronic diastolic heart failure, NYHA class 1   Diabetes mellitus, type II   Morbid obesity   Rheumatoid arthritis(714.0)   Hypertension   Acute on chronic respiratory failure with hypoxia  Pt is DNR, no cpr or intubation in future.  Endrit Gittins A 08/10/2013, 8:13 PM

## 2013-08-10 NOTE — Progress Notes (Signed)
   CARE MANAGEMENT ED NOTE 08/10/2013  Patient:  Jacqueline Washington, Jacqueline Washington   Account Number:  [id number]  Date Initiated:  08/10/2013  Documentation initiated by:  Radford Pax  Subjective/Objective Assessment:   Patient presents to Ed with worsening SOB.     Subjective/Objective Assessment Detail:   Patient with pmhx of asthma, emphysema and pneumonia. Patient puls ox 85% placed on 15 liters oxygen.     Action/Plan:   Action/Plan Detail:   Patient to be admitted   Anticipated DC Date:       Status Recommendation to Physician:   Result of Recommendation:    Other ED Services  Consult Working Plan    DC Planning Services  CM consult  Other    Choice offered to / List presented to:            Status of service:  Completed, signed off  ED Comments:   ED Comments Detail:  Patient reports that she receives homehealth dervices with [facility].  She is currently receiving oxygen, a visiting RN and PT with [facility].  Patient lives with her husband.  Patient has a walker, wheelchair, pulse ox defibulator, oxygen concentrator and safety rails in bath at home.  Patient also reports she is being seen by [facility].  Patient reports that she is currently having trouble affording her medications due to insurance company copay at the begining of the year.  EDCM instructed patient to call her insurance company to see if they can offer her any assistance.  EDCM placed text to Belenda Cruise  [facility] transition specialist to make her aware of patient's admission.  Also placed Email To [facility] Tim to let him know of her admission to hospital.  No further [facility] needs at this time.

## 2013-08-10 NOTE — ED Provider Notes (Signed)
CSN: [id number]     Arrival date & time 08/10/13  1529 History   First MD Initiated Contact with Patient 08/10/13 1553     Chief Complaint  Patient presents with  . Respiratory Distress     (Consider location/radiation/quality/duration/timing/severity/associated sxs/prior Treatment) Patient is a 63 y.o. female presenting with shortness of breath.  Shortness of Breath Severity:  Severe Onset quality:  Gradual Duration:  10 days Timing:  Constant Progression:  Worsening Chronicity:  Recurrent Relieved by:  Nothing Worsened by:  Exertion and movement Ineffective treatments:  Inhaler, rest and oxygen Associated symptoms: chest pain, cough, sputum production and wheezing   Associated symptoms: no abdominal pain, no diaphoresis, no fever, no headaches, no hemoptysis, no neck pain, no sore throat, no syncope, no swollen glands and no vomiting   Chest pain:    Quality:  Sharp   Severity:  Moderate   Duration:  10 days   Timing:  Intermittent   Progression:  Waxing and waning   Chronicity:  New Cough:    Cough characteristics:  Productive   Sputum characteristics:  Yellow   Severity:  Moderate   Duration:  10 days   Timing:  Constant   Progression:  Worsening   Chronicity:  New Wheezing:    Severity:  Severe   Duration:  10 days   Timing:  Constant   Progression:  Worsening   Chronicity:  Recurrent Risk factors: obesity   Risk factors: no hx of PE/DVT     Past Medical History  Diagnosis Date  . Asthma   . Emphysema     02 dependent 3L/min  . Emphysema   . Pneumonia   . Hypothyroidism   . Diabetes mellitus, type II   . Obesity   . Diastolic heart failure 09/22/2012    Grade 1  . Emphysema   . Rheumatoid arthritis(714.0)   . Hyperlipidemia   . Hypertension   . Vitamin D deficiency    Past Surgical History  Procedure Laterality Date  . Tubal ligation    . Colonoscopy    . Video bronchoscopy N/A 11/18/2012    Procedure: VIDEO BRONCHOSCOPY WITH FLUORO;  Surgeon:  Storm Frisk, MD;  Location: [facility];  Service: Cardiopulmonary;  Laterality: N/A;  . Eye surgery  at age 62   Family History  Problem Relation Age of Onset  . Asthma Mother   . Heart disease Mother   . Clotting disorder Mother   . Heart attack Mother   . Diabetes Mother   . Hypertension Mother   . Cirrhosis Mother   . Colon cancer Neg Hx   . Heart attack Father    History  Substance Use Topics  . Smoking status: Former Smoker -- 3.00 packs/day for 43 years    Types: Cigarettes    Quit date: 04/24/2007  . Smokeless tobacco: Never Used  . Alcohol Use: Yes     Comment: occasional   OB History   Grav Para Term Preterm Abortions TAB SAB Ect Mult Living                 Review of Systems  Constitutional: Positive for fatigue. Negative for fever, chills, diaphoresis, activity change and appetite change.  HENT: Negative for congestion, facial swelling, rhinorrhea and sore throat.   Eyes: Negative for photophobia and discharge.  Respiratory: Positive for cough, sputum production, shortness of breath and wheezing. Negative for hemoptysis and chest tightness.   Cardiovascular: Positive for chest pain. Negative for palpitations, leg swelling  and syncope.  Gastrointestinal: Negative for nausea, vomiting, abdominal pain and diarrhea.  Endocrine: Negative for polydipsia and polyuria.  Genitourinary: Negative for dysuria, frequency, difficulty urinating and pelvic pain.  Musculoskeletal: Negative for arthralgias, back pain, neck pain and neck stiffness.  Skin: Negative for color change and wound.  Allergic/Immunologic: Negative for immunocompromised state.  Neurological: Negative for facial asymmetry, weakness, numbness and headaches.  Hematological: Does not bruise/bleed easily.  Psychiatric/Behavioral: Negative for confusion and agitation.      Allergies  Chantix; Hctz; Penicillins; Sulfonamide derivatives; and Tetanus toxoid  Home Medications   No current outpatient  prescriptions on file. BP 124/59  Pulse 116  Temp(Src) 95.2 F (35.1 C) (Oral)  Resp 20  Ht 5\' 4"  (1.626 m)  Wt 209 lb 14.1 oz (95.2 kg)  BMI 36.01 kg/m2  SpO2 99% Physical Exam  Constitutional: She is oriented to person, place, and time. She appears well-developed and well-nourished. She appears distressed.  HENT:  Head: Normocephalic and atraumatic.  Mouth/Throat: No oropharyngeal exudate.  Eyes: Pupils are equal, round, and reactive to light.  Neck: Normal range of motion. Neck supple.  Cardiovascular: Regular rhythm and normal heart sounds.  Tachycardia present.  Exam reveals no gallop and no friction rub.   No murmur heard. Pulmonary/Chest: Tachypnea noted. She is in respiratory distress. She has decreased breath sounds in the right upper field, the right middle field, the right lower field, the left upper field, the left middle field and the left lower field. She has wheezes in the right upper field, the right middle field, the right lower field, the left upper field, the left middle field and the left lower field. She has no rales.  Abdominal: Soft. Bowel sounds are normal. She exhibits no distension and no mass. There is no tenderness. There is no rebound and no guarding.  Musculoskeletal: Normal range of motion. She exhibits no edema and no tenderness.  Neurological: She is alert and oriented to person, place, and time.  Skin: Skin is warm and dry.  Psychiatric: She has a normal mood and affect.    ED Course  Procedures (including critical care time) Labs Review Labs Reviewed  CBC WITH DIFFERENTIAL - Abnormal; Notable for the following:    WBC 16.0 (*)    RBC 3.71 (*)    Hemoglobin 7.8 (*)    HCT 28.7 (*)    MCV 77.4 (*)    MCH 21.0 (*)    MCHC 27.2 (*)    RDW 18.7 (*)    Neutrophils Relative % 87 (*)    Neutro Abs 13.9 (*)    Lymphocytes Relative 9 (*)    All other components within normal limits  PRO B NATRIURETIC PEPTIDE - Abnormal; Notable for the following:     Pro B Natriuretic peptide (BNP) 1099.0 (*)    All other components within normal limits  BASIC METABOLIC PANEL - Abnormal; Notable for the following:    Chloride 94 (*)    Glucose, Bld 204 (*)    GFR calc non Af Amer 75 (*)    GFR calc Af Amer 87 (*)    All other components within normal limits  BLOOD GAS, VENOUS - Abnormal; Notable for the following:    pH, Ven 7.409 (*)    Bicarbonate 29.7 (*)    Acid-Base Excess 4.9 (*)    All other components within normal limits  LACTATE DEHYDROGENASE - Abnormal; Notable for the following:    LDH 350 (*)    All other  components within normal limits  CREATININE, SERUM - Abnormal; Notable for the following:    GFR calc non Af Amer 75 (*)    GFR calc Af Amer 87 (*)    All other components within normal limits  I-STAT CG4 LACTIC ACID, ED - Abnormal; Notable for the following:    Lactic Acid, Venous 3.24 (*)    All other components within normal limits  CULTURE, BLOOD (ROUTINE X 2)  CULTURE, BLOOD (ROUTINE X 2)  FUNGUS CULTURE W SMEAR  GRAM STAIN  BODY FLUID CULTURE  CULTURE, EXPECTORATED SPUTUM-ASSESSMENT  GRAM STAIN  MRSA PCR SCREENING  TROPONIN I  PROCALCITONIN  PROTEIN, TOTAL  ASPERGILLUS GALACTOMANNAN ANTIGEN  LACTATE DEHYDROGENASE, BODY FLUID  PROTEIN, BODY FLUID  BODY FLUID CELL COUNT WITH DIFFERENTIAL  LEGIONELLA ANTIGEN, URINE  STREP PNEUMONIAE URINARY ANTIGEN  CBC  CBC WITH DIFFERENTIAL  BASIC METABOLIC PANEL  CYTOLOGY - NON PAP   Imaging Review Ct Chest Wo Contrast  08/10/2013   CLINICAL DATA:  Left-sided chest pain.  Respiratory distress.  EXAM: CT CHEST WITHOUT CONTRAST  TECHNIQUE: Multidetector CT imaging of the chest was performed following the standard protocol without IV contrast.  COMPARISON:  03/24/2013  FINDINGS: The chest wall is unremarkable. No breast masses, supraclavicular or axillary lymphadenopathy. The bony thorax is intact. No destructive bone lesions or spinal canal compromise. Moderate degenerative  changes noted in the thoracic spine and mild-to-moderate osteoporosis.  The heart is normal in size. No pericardial effusion. No mediastinal or hilar mass or adenopathy. Small scattered lymph nodes are stable. The aorta demonstrates scattered atherosclerotic calcifications. The esophagus is grossly normal.  Examination of the lung parenchyma demonstrates severe chronic lung disease with emphysema, pulmonary fibrosis, traction bronchiectasis and multiple lung cavities. There is an enlarging left-sided pleural effusion with overlying atelectasis. There is also new airspace opacity in the left upper lobe posteriorly which could be a superimposed pneumonia.  The upper abdomen is unremarkable.  IMPRESSION: Severe chronic lung disease as discussed above with suspected superimposed left upper lobe pneumonia.   Electronically Signed   By: Loralie Champagne M.D.   On: 08/10/2013 17:50   Dg Chest Port 1 View  08/10/2013   CLINICAL DATA:  resp distress  EXAM: PORTABLE CHEST - 1 VIEW  COMPARISON:  DG CHEST 2 VIEW dated 08/03/2013; DG CHEST 2 VIEW dated 10/23/2012; DG CHEST 1V PORT dated 03/22/2013  FINDINGS: The interstitial findings within the right and left lung apices are stable when compared to the previous study and when correlated with prior imaging. Hilar retraction is once again appreciated. No new focal regions of consolidation or new focal infiltrates appreciated. Cardiac silhouette is within normal limits. There is blunting of the left costophrenic angle also stable there is prominence of the interstitial markings within the remaining aerated portions of the lungs which appears stable. No new focal regions of consolidation or new focal infiltrates.  IMPRESSION: Stable chronic changes within the right and left lung apices as described above and left lung base.   Electronically Signed   By: Salome Holmes M.D.   On: 08/10/2013 16:32      MDM   Final diagnoses:  HCAP (healthcare-associated pneumonia)  Respiratory  distress  COPD exacerbation    Pt is a 63 y.o. female with Pmhx as above who presents with respiratory distress. She is 66% on home 4L by [place]. She states she has been sick since 2/16 and was told to come to the ED by her PCP. She was supposed  to have a chest CT yesterday.  She endorses, productive cough with yellow sputum, L sided sharp CP since 2/16, and gradually worsening SOB despite home breathing treatments. She is 30mg  PO prednisone at home.  Pt was very difficult as far as initiating treatments, initially did not want an IV then stated she would only get a PICC done by radiology.  I expressed my concern this was not safe as ths was on 15L by NRB with HR in the 130's and that radiology would likely not feel safe with this either. She will only let each person stick her once.  guided attempted by myself, by was unsuccessfull.    Nursing able to place a 24g in hand.  Have ordered non-contrasted CT chest as I do not feel it is prudent to wait for better IV access at this point.    CT shows likely superimposed LUL pna. Vanc, aztreoman for HCAP w/ b-lactam allergy.  Pt's RR much improved after 20mg  albuterol, 1mg  ipratropium, and solumedrol, though HR still in 120's.  Down to 5L by NRB.  Will continue to gently hydrate.  CCM consulted, will send someone to see, [facility] will admit to stepdown.         [place], MD 08/11/13 [phone]

## 2013-08-10 NOTE — Progress Notes (Signed)
ANTIBIOTIC CONSULT NOTE - INITIAL  Pharmacy Consult for vancomycin, aztreonam, renal antibiotic adjustment Indication: rule out pneumonia  Allergies  Allergen Reactions  . Chantix [Varenicline] Other (See Comments)    Unknown " blurry vision"  . Hctz [Hydrochlorothiazide] Other (See Comments)    unknown  . Penicillins Rash  . Sulfonamide Derivatives Rash  . Tetanus Toxoid Other (See Comments)    Knot on arm    Patient Measurements: Body weight: 98kg on 07/07/13 IBW: 54.7kg Adjusted Body Weight: 72kg  Vital Signs: Temp: 98.2 F (36.8 C) (02/25 1551) Temp src: Oral (02/25 1551) BP: 121/52 mmHg (02/25 1740)  Labs:  Recent Labs  08/10/13 1554  WBC 16.0*  HGB 7.8*  PLT 303  CREATININE 0.82    Medical History: Past Medical History  Diagnosis Date  . Asthma   . Emphysema     02 dependent 3L/min  . Emphysema   . Pneumonia   . Hypothyroidism   . Diabetes mellitus, type II   . Obesity   . Diastolic heart failure 09/22/2012    Grade 1  . Emphysema   . Rheumatoid arthritis(714.0)   . Hyperlipidemia   . Hypertension   . Vitamin D deficiency     Medications:  Scheduled:   Infusions:  . aztreonam    . sodium chloride     Assessment: 62 yoF admitted 2/25 in respiratory distress. Pt with Hx COPD on home O2, also on chronic steroids. Pharmacy has been consulted to dose vancomycin and aztreonam for suspected HCAP/pneumonitis and for renal adjustment of antibiotics.  Noted MD ordered aztreonam 2g IV x1 which has not yet been administered   Antiinfectives 2/25 >> aztreonam >> 2/25 >> vancomycin >>    Tmax: afebrile WBCs: elevated to 16.0 Renal: Scr 0.82 (appears close to patient's baseline), CrCl 80 ml/min CG and normalized  Microbiology No cultures have been drawn this admission   Goal of Therapy:  Vancomycin trough level 15-20 mcg/ml appropriate renal dosing of antibiotics  Plan:  - vancomycin 2g IV x1 - vancomycin 1250mg  IV q12h to start 2/26 at  0800 - aztreonam 2g IV q8h - vancomycin trough at steady state if indicated - follow-up clinical course, culture results, renal function - follow-up antibiotic de-escalation and length of therapy  Thank you for the consult.  3/26, PharmD, BCPS Pager: [phone] Pharmacy: [phone] 08/10/2013 7:01 PM

## 2013-08-11 ENCOUNTER — Inpatient Hospital Stay (HOSPITAL_COMMUNITY): Payer: Medicare Other

## 2013-08-11 ENCOUNTER — Encounter (HOSPITAL_COMMUNITY): Payer: Self-pay | Admitting: *Deleted

## 2013-08-11 ENCOUNTER — Ambulatory Visit (HOSPITAL_COMMUNITY): Admission: RE | Admit: 2013-08-11 | Payer: Medicare Other | Source: Ambulatory Visit

## 2013-08-11 DIAGNOSIS — B441 Other pulmonary aspergillosis: Secondary | ICD-10-CM

## 2013-08-11 DIAGNOSIS — B4481 Allergic bronchopulmonary aspergillosis: Secondary | ICD-10-CM

## 2013-08-11 DIAGNOSIS — E119 Type 2 diabetes mellitus without complications: Secondary | ICD-10-CM

## 2013-08-11 LAB — CBC WITH DIFFERENTIAL/PLATELET
BASOS PCT: 0 % (ref 0–1)
Basophils Absolute: 0 10*3/uL (ref 0.0–0.1)
Eosinophils Absolute: 0 10*3/uL (ref 0.0–0.7)
Eosinophils Relative: 0 % (ref 0–5)
HCT: 25.1 % — ABNORMAL LOW (ref 36.0–46.0)
Hemoglobin: 7.1 g/dL — ABNORMAL LOW (ref 12.0–15.0)
Lymphocytes Relative: 3 % — ABNORMAL LOW (ref 12–46)
Lymphs Abs: 0.4 10*3/uL — ABNORMAL LOW (ref 0.7–4.0)
MCH: 21.7 pg — AB (ref 26.0–34.0)
MCHC: 28.3 g/dL — AB (ref 30.0–36.0)
MCV: 76.8 fL — ABNORMAL LOW (ref 78.0–100.0)
Monocytes Absolute: 0.3 10*3/uL (ref 0.1–1.0)
Monocytes Relative: 3 % (ref 3–12)
NEUTROS ABS: 10.2 10*3/uL — AB (ref 1.7–7.7)
NEUTROS PCT: 94 % — AB (ref 43–77)
Platelets: 277 10*3/uL (ref 150–400)
RBC: 3.27 MIL/uL — ABNORMAL LOW (ref 3.87–5.11)
RDW: 18.6 % — ABNORMAL HIGH (ref 11.5–15.5)
WBC: 10.8 10*3/uL — ABNORMAL HIGH (ref 4.0–10.5)

## 2013-08-11 LAB — BASIC METABOLIC PANEL
BUN: 12 mg/dL (ref 6–23)
CO2: 27 mEq/L (ref 19–32)
Calcium: 8.9 mg/dL (ref 8.4–10.5)
Chloride: 99 mEq/L (ref 96–112)
Creatinine, Ser: 0.69 mg/dL (ref 0.50–1.10)
GFR calc non Af Amer: >90 mL/min (ref >90)
Glucose, Bld: 164 mg/dL — ABNORMAL HIGH (ref 70–99)
POTASSIUM: 3.8 meq/L (ref 3.7–5.3)
SODIUM: 139 meq/L (ref 137–147)

## 2013-08-11 LAB — GLUCOSE, CAPILLARY
GLUCOSE-CAPILLARY: 167 mg/dL — AB (ref 70–99)
GLUCOSE-CAPILLARY: 164 mg/dL — AB (ref 70–99)
Glucose-Capillary: 94 mg/dL (ref 70–99)
Glucose-Capillary: 118 mg/dL — ABNORMAL HIGH (ref 70–99)

## 2013-08-11 LAB — STREP PNEUMONIAE URINARY ANTIGEN: Strep Pneumo Urinary Antigen: POSITIVE — AB

## 2013-08-11 LAB — MRSA PCR SCREENING: MRSA BY PCR: POSITIVE — AB

## 2013-08-11 MED ORDER — BOOST PLUS PO LIQD
237.0000 mL | Freq: Once | ORAL | Status: AC
  Administered 2013-08-11: 237 mL via ORAL
  Filled 2013-08-11: qty 237

## 2013-08-11 MED ORDER — MUPIROCIN 2 % EX OINT
1.0000 "application " | TOPICAL_OINTMENT | Freq: Two times a day (BID) | CUTANEOUS | Status: AC
  Administered 2013-08-11 – 2013-08-15 (×10): 1 via NASAL
  Filled 2013-08-11: qty 22

## 2013-08-11 MED ORDER — ENSURE COMPLETE PO LIQD
237.0000 mL | Freq: Two times a day (BID) | ORAL | Status: DC
  Administered 2013-08-13 (×2): 237 mL via ORAL

## 2013-08-11 MED ORDER — CHLORHEXIDINE GLUCONATE CLOTH 2 % EX PADS
6.0000 | MEDICATED_PAD | Freq: Every day | CUTANEOUS | Status: AC
  Administered 2013-08-13 – 2013-08-15 (×3): 6 via TOPICAL

## 2013-08-11 MED ORDER — LIDOCAINE HCL 1 % IJ SOLN
INTRAMUSCULAR | Status: AC
  Filled 2013-08-11: qty 20

## 2013-08-11 MED ORDER — VORICONAZOLE 200 MG IV SOLR
4.0000 mg/kg | Freq: Two times a day (BID) | INTRAVENOUS | Status: DC
  Administered 2013-08-13 – 2013-08-15 (×5): 380 mg via INTRAVENOUS
  Filled 2013-08-11 (×11): qty 380

## 2013-08-11 MED ORDER — VORICONAZOLE 200 MG IV SOLR
6.0000 mg/kg | Freq: Two times a day (BID) | INTRAVENOUS | Status: AC
  Administered 2013-08-11 – 2013-08-12 (×2): 570 mg via INTRAVENOUS
  Filled 2013-08-11 (×2): qty 570

## 2013-08-11 MED ORDER — METHYLPREDNISOLONE SODIUM SUCC 40 MG IJ SOLR
40.0000 mg | Freq: Two times a day (BID) | INTRAMUSCULAR | Status: DC
  Administered 2013-08-11 – 2013-08-12 (×2): 40 mg via INTRAVENOUS
  Filled 2013-08-11 (×3): qty 1

## 2013-08-11 MED ORDER — ALBUTEROL SULFATE (2.5 MG/3ML) 0.083% IN NEBU
2.5000 mg | INHALATION_SOLUTION | Freq: Four times a day (QID) | RESPIRATORY_TRACT | Status: DC
  Administered 2013-08-11 – 2013-08-14 (×10): 2.5 mg via RESPIRATORY_TRACT
  Filled 2013-08-11 (×11): qty 3

## 2013-08-11 NOTE — Progress Notes (Addendum)
Per case management referral request list of area hospice providers given to patient and family.    Patient explained that she does not have cancer.  But I did explain to patient and family member present that hospice is not just for cancer but offers comfort care and support for various conditions.  Falana Clagg Masden,RN,BSN,CCM Original Order    Ordered On Ordered By     Thu Aug 11, 2013 11:51 AM Duayne Cal, NP     Comments    Would like to set up home hospice as well.

## 2013-08-11 NOTE — Progress Notes (Signed)
[facility]  Patient Status: Active (receiving services up to time of hospitalization)  [facility] is providing the following services: RN and PT  If patient discharges after hours, please call [phone].   Kristen Hayworth 08/11/2013, 8:00 AM

## 2013-08-11 NOTE — Progress Notes (Signed)
Name: Jacqueline Washington MRN: [id number] DOB: [birth year]    ADMISSION DATE:  08/10/2013 CONSULTATION DATE:  2/25  REFERRING MD :  Micheline Maze  PRIMARY SERVICE:  [facility] Azzie Roup.   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 Presented to ER on 2/25 w/ essentially 4 weeks of progressive dyspnea w/ evolving pleuritic type CP on left. PCCM asked to assist w/ care.   SIGNIFICANT EVENTS / STUDIES:  CT chest 2/25: severe chronic lung disease with emphysema, pulmonary fibrosis, traction bronchiectasis and multiple lung cavities. There is an enlarging left-sided pleural effusion with overlying atelectasis. There is also new airspace opacity in the left upper lobe posteriorly Left upper lobe cavitary lesion concerning for fungal bal.   LINES / TUBES: PICC planned for 1/26 >>>  CULTURES: Fungal smear sputum 2/25>>> Aspergillus galactomannan antigen 2/25>>> Sputum 2/25>>> MRSA PCR 2/25 > POS Urine Strep 2/25 >>> POS BCx2 2/25 >>> Sputum 2/25 >>> Sputum fungal 2/25 >>>  ANTIBIOTICS: vanc 2/25>2/26 azactam 2/25>2/25 Levaquin 2/25 >>>  SUBJECTIVE: Feels better than yesterday, still very SOB with exertion on 6 L.  VITAL SIGNS: Temp:  [95.2 F (35.1 C)-98.2 F (36.8 C)] 98.2 F (36.8 C) (02/26 0800) Pulse Rate:  [93-123] 93 (02/26 0400) Resp:  [19-34] 19 (02/26 0400) BP: (121-155)/(52-110) 129/53 mmHg (02/26 0400) SpO2:  [91 %-100 %] 94 % (02/26 0810) FiO2 (%):  [35 %-100 %] 35 % (02/26 0810) Weight:  [95.2 kg (209 lb 14.1 oz)] 95.2 kg (209 lb 14.1 oz) (02/25 2326)  PHYSICAL EXAMINATION: General:  Chronically ill appearing 62 year old female,  not currently in acute distress  Neuro:  Awake, alert, no focal def  HEENT:  [place], no JVD, mmm  Cardiovascular:  rrr Lungs:  Exp wz, crackles bilateral bases.  Abdomen:  Obese + bowel sounds  Musculoskeletal:  Intact  Skin:  LE edema    Recent Labs Lab 08/10/13 1554 08/10/13 2241 08/11/13 0308  NA 138  --  139  K 4.4  --  3.8  CL 94*  --  99  CO2 27  --  27  BUN 14  --  12  CREATININE 0.82 0.82 0.69  GLUCOSE 204*  --  164*    Recent Labs Lab 08/10/13 1554 08/11/13 0308  HGB 7.8* 7.1*  HCT 28.7* 25.1*  WBC 16.0* 10.8*  PLT 303 277   Ct Chest Wo Contrast  08/10/2013   CLINICAL DATA:  Left-sided chest pain.  Respiratory distress.  EXAM: CT CHEST WITHOUT CONTRAST  TECHNIQUE: Multidetector CT imaging of the chest was performed following the standard protocol without IV contrast.  COMPARISON:  03/24/2013  FINDINGS: The chest wall is unremarkable. No breast masses, supraclavicular or axillary lymphadenopathy. The bony thorax is intact. No destructive bone lesions or spinal canal compromise. Moderate degenerative changes noted in the thoracic spine and mild-to-moderate osteoporosis.  The heart is normal in size. No pericardial effusion. No mediastinal or hilar mass or adenopathy. Small scattered lymph nodes are stable. The aorta demonstrates scattered atherosclerotic calcifications. The esophagus is grossly normal.  Examination of the lung parenchyma demonstrates severe chronic lung disease with emphysema, pulmonary fibrosis, traction bronchiectasis and multiple lung cavities.  There is an enlarging left-sided pleural effusion with overlying atelectasis. There is also new airspace opacity in the left upper lobe posteriorly which could be a superimposed pneumonia.  The upper abdomen is unremarkable.  IMPRESSION: Severe chronic lung disease as discussed above with suspected superimposed left upper lobe pneumonia.   Electronically Signed   By: Loralie Champagne M.D.   On: 08/10/2013 17:50   Dg  Chest Port 1 View  08/11/2013   CLINICAL DATA:  Cough.  EXAM: PORTABLE CHEST - 1 VIEW  COMPARISON:  CT CHEST W/O CM dated 08/10/2013; DG CHEST 1V PORT dated 08/10/2013; DG CHEST 2 VIEW dated 08/03/2013; DG CHEST 1V PORT dated 03/23/2013  FINDINGS: Mediastinum is stable. Severe biapical pleural parenchymal thickening and interstitial prominence noted with retraction of the hilum consistent with scarring. Pulmonary density in the left upper lobe although possibly related to scarring has progressed from prior year's exam. Chest CT suggested for further evaluation. Heart size normal. Changes of pleural scarring left base . No pneumothorax. No acute osseous abnormality.  IMPRESSION: 1. Biapical severe pleural parenchymal scarring and interstitial fibrosis. 2. Left upper lung pulmonary density has progressed in size over prior year. Chest CT is suggested for further evaluation   Electronically Signed   By: Maisie Fus  Register   On: 08/11/2013 09:42   Dg Chest Port 1 View  08/10/2013   CLINICAL DATA:  resp distress  EXAM: PORTABLE CHEST - 1 VIEW  COMPARISON:  DG CHEST 2 VIEW dated 08/03/2013; DG CHEST 2 VIEW dated 10/23/2012; DG CHEST 1V PORT dated 03/22/2013  FINDINGS: The interstitial findings within the right and left lung apices are stable when compared to the previous study and when correlated with prior imaging. Hilar retraction is once again appreciated. No new focal regions of consolidation or new focal infiltrates appreciated. Cardiac silhouette is within normal limits. There is blunting of the left costophrenic angle also stable there is prominence of the interstitial markings within the remaining aerated portions of the lungs which appears stable. No new focal regions of consolidation or new focal infiltrates.  IMPRESSION: Stable chronic changes within the right and left lung apices as described above and left lung base.   Electronically Signed   By: Salome Holmes M.D.   On: 08/10/2013 16:32    ASSESSMENT /  PLAN: Acute on chronic respiratory failure (multifactorial) Rheumatoid related Pulmonary Fibrosis New Left upper lobe consolidation suspicious for aspergillus  Multiple lung cavities w/ concern for fungal ball on series 7/ image 17 New left pleural effusion (not big enough for thoracentesis by bedside US) Fluid volume overload - ?pulmonary edema component Emphysema (don't think that this is AECOPD)  Recommendation  -Change ABX to Levaquin -Start voriconazole per pharmacy -f/u aspergillus galactomannan antigen and aspergillus antibody.  -f/u cultures  -Follow PCT trends -pulse steroid 40mg  q 12, with hopes of titration to off in future.  -lasix as tolerated, trend BMP -hold thoracentesis for now, will continue to follow CXR -PICC scheduled for this afternoon   Goals of care discussed with family members and they seem agreeable to home hospice and DNR. Care management and Social work consulted today. Considering palliative care consult.   , ACNP [facility] Pulmonology/Critical Care Pager [phone] or [phone]  08/11/2013, 10:24 AM  PCCM ATTENDING: I have interviewed and examined the patient and reviewed the database. I have formulated the assessment and plan as reflected in the note above with amendments made by me.   I have reviewed Xrays of chest going  back several months. 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 It is reasonable to treat empirically with voriconazole which will likely be lifelong therapy. It is unlikely that we will get much reversal of her advanced fibrocavitary disease but hopefully we can slow the progression.   It is impossible to rule out a bacterial infection as a component of her pulmonary disease but her PCT is low making it less likely. Will DC aztreonam today and consider DC of vanc if resp culture does not grow a resistant GPC. It is  reasonable to empirically complete a 10 day course of levofloxacin  Given the advanced nature of this lung disease, I have asked for Palliative Care to see and we should set up for Hospice after discharge. It would be desirable to try to reduce her prednisone dose as I am not convinced there is a steroid responsive component to her lung disease. At this point, she needs steroids for adrenal insufficiency more than for her lung disease.  As we work towards discharge, suggest that we try to narrow her medical regimen if possible. It is highly unlikely that she is deriving much benefit from montelukast and we have stopped this medication. I am unable to discern a diagnosis for which allopurinol is indicated. Would consider discontinuation  Billy Fischer, MD;  PCCM service; Mobile [phone]

## 2013-08-11 NOTE — Progress Notes (Signed)
[facility] PROGRESS NOTE  Jacqueline Washington INO:[id number] DOB: [birth year] DOA: 08/10/2013 PCP: Nadean Corwin, MD  Assessment/Plan: 1. Acute on chronic respiratory failure- multifactorial, on antibiotics for possible HCAP. Fungal work is pending, will start the antifungals if positive.Continue with prednisone 80 mg po q 8 hr. 2. Pleural effusion- Will repeat CXR , if it shows worsening effusion, will obtain diagnostic thoracentesis. 3. Pulmonary fibrosis- She has h/o rheumatoid arthritis, will continue with high dose Prednisone 80 mg po Q 8 hr. 4. Grade 1 diastolic dysfunction- she has h/o grade 1 diastolic dysfunction,continue with lasix 40 mg po daliy. BNP is elevated to 1099. 5. Diabetes mellitus- Continue with SSI, Blood glucose is well controlled.  Code Status: DNR Family Communication: Discussed with daughter at bedside Disposition Plan: Home when stable   Consultants:  pulmonary  Procedures:  None  Antibiotics:  Azactam 2/25>>  Levaquin 2/25>>  Vancomycin 2/25>>  HPI/Subjective: Patient seen and examined, feels better today. Says her breathing has somewhat improved. Picc line is pending  Objective: Filed Vitals:   08/11/13 0400  BP: 129/53  Pulse: 93  Temp:   Resp: 19    Intake/Output Summary (Last 24 hours) at 08/11/13 0913 Last data filed at 08/11/13 0600  Gross per 24 hour  Intake    670 ml  Output   1400 ml  Net   -730 ml   Filed Weights   08/10/13 2326  Weight: 95.2 kg (209 lb 14.1 oz)    Exam:       Physical Exam: Head: Normocephalic, atraumatic.  Eyes: No signs of jaundice, EOMI Nose: Mucous membranes dry.  Throat: Oropharynx nonerythematous, no exudate appreciated.  Neck: supple,No deformities, masses, or tenderness noted. Lungs: Bilateral rhonchi Heart: Regular RR. S1 and S2 normal  Abdomen: BS normoactive. Soft, Nondistended, non-tender.  Extremities: No pretibial edema, no erythema   Data Reviewed: Basic Metabolic  Panel:  Recent Labs Lab 08/10/13 1554 08/10/13 2241 08/11/13 0308  NA 138  --  139  K 4.4  --  3.8  CL 94*  --  99  CO2 27  --  27  GLUCOSE 204*  --  164*  BUN 14  --  12  CREATININE 0.82 0.82 0.69  CALCIUM 9.3  --  8.9   Liver Function Tests:  Recent Labs Lab 08/10/13 2241  PROT 7.1   No results found for this basename: LIPASE, AMYLASE,  in the last 168 hours No results found for this basename: AMMONIA,  in the last 168 hours CBC:  Recent Labs Lab 08/10/13 1554 08/11/13 0308  WBC 16.0* 10.8*  NEUTROABS 13.9* 10.2*  HGB 7.8* 7.1*  HCT 28.7* 25.1*  MCV 77.4* 76.8*  PLT 303 277   Cardiac Enzymes:  Recent Labs Lab 08/10/13 1554  TROPONINI <0.30   BNP (last 3 results)  Recent Labs  03/25/13 0601 04/14/13 2300 08/10/13 1554  PROBNP 1150.0* 301.6* 1099.0*   CBG:  Recent Labs Lab 08/11/13 0844  GLUCAP 167*    Recent Results (from the past 240 hour(s))  MRSA PCR SCREENING     Status: Abnormal   Collection Time    08/10/13 11:40 PM      Result Value Ref Range Status   MRSA by PCR POSITIVE (*) NEGATIVE Final   Comment:            The GeneXpert MRSA Assay (FDA     approved for NASAL specimens     only), is one component of a  comprehensive MRSA colonization     surveillance program. It is not     intended to diagnose MRSA     infection nor to guide or     monitor treatment for     MRSA infections.     RESULT CALLED TO, READ BACK BY AND VERIFIED WITH:     SPOKE WITH AYERS,C RN [phone] [place],N     Studies: Ct Chest Wo Contrast  08/10/2013   CLINICAL DATA:  Left-sided chest pain.  Respiratory distress.  EXAM: CT CHEST WITHOUT CONTRAST  TECHNIQUE: Multidetector CT imaging of the chest was performed following the standard protocol without IV contrast.  COMPARISON:  03/24/2013  FINDINGS: The chest wall is unremarkable. No breast masses, supraclavicular or axillary lymphadenopathy. The bony thorax is intact. No destructive bone lesions or  spinal canal compromise. Moderate degenerative changes noted in the thoracic spine and mild-to-moderate osteoporosis.  The heart is normal in size. No pericardial effusion. No mediastinal or hilar mass or adenopathy. Small scattered lymph nodes are stable. The aorta demonstrates scattered atherosclerotic calcifications. The esophagus is grossly normal.  Examination of the lung parenchyma demonstrates severe chronic lung disease with emphysema, pulmonary fibrosis, traction bronchiectasis and multiple lung cavities. There is an enlarging left-sided pleural effusion with overlying atelectasis. There is also new airspace opacity in the left upper lobe posteriorly which could be a superimposed pneumonia.  The upper abdomen is unremarkable.  IMPRESSION: Severe chronic lung disease as discussed above with suspected superimposed left upper lobe pneumonia.   Electronically Signed   By: Loralie Champagne M.D.   On: 08/10/2013 17:50   Dg Chest Port 1 View  08/10/2013   CLINICAL DATA:  resp distress  EXAM: PORTABLE CHEST - 1 VIEW  COMPARISON:  DG CHEST 2 VIEW dated 08/03/2013; DG CHEST 2 VIEW dated 10/23/2012; DG CHEST 1V PORT dated 03/22/2013  FINDINGS: The interstitial findings within the right and left lung apices are stable when compared to the previous study and when correlated with prior imaging. Hilar retraction is once again appreciated. No new focal regions of consolidation or new focal infiltrates appreciated. Cardiac silhouette is within normal limits. There is blunting of the left costophrenic angle also stable there is prominence of the interstitial markings within the remaining aerated portions of the lungs which appears stable. No new focal regions of consolidation or new focal infiltrates.  IMPRESSION: Stable chronic changes within the right and left lung apices as described above and left lung base.   Electronically Signed   By: Salome Holmes M.D.   On: 08/10/2013 16:32    Scheduled Meds: . albuterol  2.5 mg  Nebulization Q4H  . allopurinol  100 mg Oral Daily  . aztreonam  2 g Intravenous Q8H  . Chlorhexidine Gluconate Cloth  6 each Topical Q0600  . enoxaparin (LOVENOX) injection  40 mg Subcutaneous Q24H  . furosemide  40 mg Oral Daily  . insulin aspart  0-9 Units Subcutaneous TID WC  . leflunomide  20 mg Oral Daily  . levofloxacin  750 mg Oral Daily  . methylPREDNISolone (SOLU-MEDROL) injection  80 mg Intravenous Q12H  . minocycline  100 mg Oral BID  . montelukast  10 mg Oral Daily  . mupirocin ointment  1 application Nasal BID  . pantoprazole  40 mg Oral Daily  . potassium chloride SA  60 mEq Oral Daily  . sodium chloride  3 mL Intravenous Q12H  . tiotropium  18 mcg Inhalation Daily  . vancomycin  1,250 mg  Intravenous Q12H   Continuous Infusions:   Principal Problem:   HCAP (healthcare-associated pneumonia) Active Problems:   Copd Gold C    Chronic diastolic heart failure, NYHA class 1   Diabetes mellitus, type II   Morbid obesity   Rheumatoid arthritis(714.0)   Hypertension   Acute on chronic respiratory failure with hypoxia    Time spent: 25 min    [facility]  [facility] Pager [phone]*. If 7PM-7AM, please contact night-coverage at [email or web address], password [facility] 08/11/2013, 9:13 AM  LOS: 1 day

## 2013-08-11 NOTE — Progress Notes (Signed)
ANTIBIOTIC CONSULT NOTE - INITIAL  Pharmacy Consult for Voriconazole Indication: Pulmonary Aspergillosis  Allergies  Allergen Reactions  . Chantix [Varenicline] Other (See Comments)    Unknown " blurry vision"  . Hctz [Hydrochlorothiazide] Other (See Comments)    unknown  . Penicillins Rash  . Sulfonamide Derivatives Rash  . Tetanus Toxoid Other (See Comments)    Knot on arm    Patient Measurements: Height: 5\' 4"  (162.6 cm) Weight: 209 lb 14.1 oz (95.2 kg) IBW/kg (Calculated) : 54.7  Vital Signs: Temp: 98.2 F (36.8 C) (02/26 0800) Temp src: Oral (02/26 0800) BP: 129/53 mmHg (02/26 0400) Pulse Rate: 93 (02/26 0400) Intake/Output from previous day: 02/25 0701 - 02/26 0700 In: 670 [P.O.:120; I.V.:500; IV Piggyback:50] Out: 1400 [Urine:1400] Intake/Output from this shift:    Labs:  Recent Labs  08/10/13 1554 08/10/13 2241 08/11/13 0308  WBC 16.0*  --  10.8*  HGB 7.8*  --  7.1*  PLT 303  --  277  CREATININE 0.82 0.82 0.69   Estimated Creatinine Clearance: 81.6 ml/min (by C-G formula based on Cr of 0.69). No results found for this basename: VANCOTROUGH, 08/13/13, VANCORANDOM, GENTTROUGH, GENTPEAK, GENTRANDOM, TOBRATROUGH, TOBRAPEAK, TOBRARND, AMIKACINPEAK, AMIKACINTROU, AMIKACIN,  in the last 72 hours   Microbiology: Recent Results (from the past 720 hour(s))  MRSA PCR SCREENING     Status: Abnormal   Collection Time    08/10/13 11:40 PM      Result Value Ref Range Status   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 It is not     intended to diagnose MRSA     infection nor to guide or     monitor treatment for     MRSA infections.     RESULT CALLED TO, READ BACK BY AND VERIFIED WITH:     SPOKE WITH AYERS,C RN [phone] [place],N    Medical History: Past Medical History   Diagnosis Date  . Asthma   . Emphysema     02 dependent 3L/min  . Emphysema   . Pneumonia   . Hypothyroidism   . Diabetes mellitus, type II   . Obesity   . Diastolic heart failure 09/22/2012    Grade 1  . Emphysema   . Rheumatoid arthritis(714.0)   . Hyperlipidemia   . Hypertension   . Vitamin D deficiency     Medications:  Scheduled:  . albuterol  2.5 mg Nebulization Q6H  . allopurinol  100 mg Oral Daily  . Chlorhexidine Gluconate Cloth  6 each Topical Q0600  . enoxaparin (LOVENOX) injection  40 mg Subcutaneous Q24H  . furosemide  40 mg Oral Daily  . insulin aspart  0-9 Units Subcutaneous TID WC  . leflunomide  20 mg Oral Daily  . levofloxacin  750 mg Oral Daily  . lidocaine      . methylPREDNISolone (SOLU-MEDROL) injection  40 mg Intravenous Q12H  . minocycline  100 mg Oral BID  . mupirocin ointment  1 application Nasal BID  . pantoprazole  40 mg Oral Daily  . potassium chloride SA  60 mEq Oral Daily  . sodium chloride  3 mL Intravenous Q12H  . tiotropium  18 mcg Inhalation Daily  . vancomycin  1,250 mg Intravenous Q12H    Anti-infectives: 2/25 >> aztreonam >> 2/26 2/25 >> vancomycin >>   2/26 >> levaquin >> 2/26 >> voriconazole >>  Assessment: 63 yo female known to pharmacy from vancomycin/aztreonam protocols. CCM now adding voriconazole during work-up of possible semi-invasive pulmonary Aspergillosis - galactomannan antigen: in process.   Goal of Therapy:  Eradication of infection  Plan:   Voriconzole 6mg /kg IV q12h x 2 as loading dose then 4mg /kg IV q12h as maintenance dose based on actual body weight.   Plan transition to oral 200mg  BID pending clinical response   , PharmD, BCPS Pager: [phone] 08/11/2013,11:37 AM

## 2013-08-11 NOTE — Progress Notes (Signed)
Thank you for consulting the Palliative Medicine Team at [facility] to meet your patient's and family's needs.   The reason that you asked [place] to see your patient is for GOC and options.  We have scheduled your patient for a meeting: 2/27 1230 pm  The Surrogate decision make is: Nitisha Civello (spouse) Contact information: [phone]   Other family members that need to be present: children    Your patient is able/unable to participate: able  Yong Channel, NP Palliative Medicine Team Pager # [phone] (M-F 8a-5p) Team Phone # [phone] (Nights/Weekends)

## 2013-08-11 NOTE — Procedures (Signed)
RUE PICC manipulated and tip is SVC RA and functioning well.

## 2013-08-11 NOTE — Progress Notes (Signed)
INITIAL NUTRITION ASSESSMENT  DOCUMENTATION CODES Per approved criteria  -Obesity Unspecified   INTERVENTION: - Ensure Complete BID - Encouraged increased meal intake and to select protein rich foods/beverages - Will continue to monitor   NUTRITION DIAGNOSIS: Increased nutrient needs related to acute respiratory failure as evidenced by MD notes.   Goal: Pt to consume >90% of meals/supplements  Monitor:  Weights, labs, intake  Reason for Assessment: Malnutrition screening tool   63 y.o. 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 No fevers. Coughing a lot. She has been on a slow predisone taper with her pulmonologist and over a week ago went down from 48m pred a day to 315ma day. She has chronic lower extremity edema which is no worse or better than normal. Palliative care has been consulted. Pt found to have enlarging left-sided pleural effusion with overlying atelectasis. There is also new airspace opacity in the left upper lobe posteriorly left upper lobe cavitary lesion concerning for fungal ball.  Met with pt who reports poor appetite since the beginning of the month and has been consuming 2 meals/day of things like a bagel for breakfast and a salad for lunch. States her weight fluctuates up and down. States her blood sugars have been under control at home. Ate more than 50% of both meals so far today.   Height: Ht Readings from Last 1 Encounters:  08/10/13 _0  (1.626 m)    Weight: Wt Readings from Last 1 Encounters:  08/10/13 209 lb 14.1 oz (95.2 kg)    Ideal Body Weight: 120 lb   % Ideal Body Weight: 174%  Wt Readings from Last 10 Encounters:  08/10/13 209 lb 14.1 oz (95.2 kg)  07/07/13 216 lb (97.977 kg)   06/22/13 212 lb 12.8 oz (96.525 kg)  06/13/13 208 lb (94.348 kg)  05/30/13 210 lb (95.255 kg)  05/13/13 218 lb 3.2 oz (98.975 kg)  05/03/13 215 lb (97.523 kg)  04/29/13 212 lb 9.6 oz (96.435 kg)  04/17/13 209 lb 14.1 oz (95.2 kg)  04/14/13 207 lb (93.895 kg)    Usual Body Weight: 208-216 lb in the past 2 months  % Usual Body Weight: 100%  BMI:  Body mass index is 36.01 kg/(m^2). Class II obesity  Estimated Nutritional Needs: Kcal: 1400-1600 Protein: 65-80g Fluid: 1.4-1.6L/day  Skin: Non-pitting RLE, LLE edema  Diet Order: Cardiac  EDUCATION NEEDS: -No education needs identified at this time   Intake/Output Summary (Last 24 hours) at 08/11/13 1609 Last data filed at 08/11/13 1510  Gross per 24 hour  Intake   1167 ml  Output   1800 ml  Net   -633 ml    Last BM: PTA  Labs:   Recent Labs Lab 08/10/13 1554 08/10/13 2241 08/11/13 0308  NA 138  --  139  K 4.4  --  3.8  CL 94*  --  99  CO2 27  --  27  BUN 14  --  12  CREATININE 0.82 0.82 0.69  CALCIUM 9.3  --  8.9  GLUCOSE 204*  --  164*    CBG (last 3)   Recent Labs  08/11/13 0844 08/11/13 1308  GLUCAP 167* 94    Scheduled Meds: . albuterol  2.5 mg Nebulization Q6H  . allopurinol  100 mg Oral  Daily  . Chlorhexidine Gluconate Cloth  6 each Topical Q0600  . enoxaparin (LOVENOX) injection  40 mg Subcutaneous Q24H  . furosemide  40 mg Oral Daily  . insulin aspart  0-9 Units Subcutaneous TID WC  . lactose free nutrition  237 mL Oral Once  . leflunomide  20 mg Oral Daily  . levofloxacin  750 mg Oral Daily  . lidocaine      . lidocaine      . methylPREDNISolone (SOLU-MEDROL) injection  40 mg Intravenous Q12H  . mupirocin ointment  1 application Nasal BID  . pantoprazole  40 mg Oral Daily  . potassium chloride SA  60 mEq Oral Daily  . sodium chloride  3 mL Intravenous Q12H  . tiotropium  18 mcg Inhalation Daily  . voriconazole (VFEND - PT WT > 85 kg) IV  6 mg/kg Intravenous Q12H   Followed by   . [START ON 08/12/2013] voriconazole (VFEND - PT WT > 85 kg) IV  4 mg/kg Intravenous Q12H    Continuous Infusions:   Past Medical History  Diagnosis Date  . Asthma   . Emphysema     02 dependent 3L/min  . Emphysema   . Pneumonia   . Hypothyroidism   . Diabetes mellitus, type II   . Obesity   . Diastolic heart failure 11/16/2631    Grade 1  . Emphysema   . Rheumatoid arthritis(714.0)   . Hyperlipidemia   . Hypertension   . Vitamin D deficiency     Past Surgical History  Procedure Laterality Date  . Tubal ligation    . Colonoscopy    . Video bronchoscopy N/A 11/18/2012    Procedure: VIDEO BRONCHOSCOPY WITH FLUORO;  Surgeon: Elsie Stain, MD;  Location: [facility];  Service: Cardiopulmonary;  Laterality: N/A;  . Eye surgery  at age 63 [address] [place], [place], [place] Pager [phone] After Hours Pager

## 2013-08-11 NOTE — Progress Notes (Signed)
Patient is active with [facility] Care Management for COPD, DM disease management. She has also been assisted in the past with medications as well. Will continue to follow and will make unit inpatient RNCM aware patient is active with [facility] Care Management. Raiford Noble, MSN- [facility] [phone]

## 2013-08-11 NOTE — Procedures (Signed)
RUE PICC 43 cm SVC RA No comp 

## 2013-08-11 NOTE — Progress Notes (Signed)
Pt with hr in 120's this am, b/p good and hgb 7.1. Midlevel paged awaiting call back.

## 2013-08-12 ENCOUNTER — Inpatient Hospital Stay (HOSPITAL_COMMUNITY): Payer: Medicare Other

## 2013-08-12 ENCOUNTER — Other Ambulatory Visit: Payer: Self-pay | Admitting: Internal Medicine

## 2013-08-12 DIAGNOSIS — Z515 Encounter for palliative care: Secondary | ICD-10-CM

## 2013-08-12 DIAGNOSIS — Z66 Do not resuscitate: Secondary | ICD-10-CM

## 2013-08-12 DIAGNOSIS — M069 Rheumatoid arthritis, unspecified: Secondary | ICD-10-CM

## 2013-08-12 DIAGNOSIS — R5381 Other malaise: Secondary | ICD-10-CM

## 2013-08-12 LAB — LEGIONELLA ANTIGEN, URINE: Legionella Antigen, Urine: NEGATIVE

## 2013-08-12 LAB — BASIC METABOLIC PANEL
BUN: 21 mg/dL (ref 6–23)
CHLORIDE: 102 meq/L (ref 96–112)
CO2: 27 mEq/L (ref 19–32)
Calcium: 8.9 mg/dL (ref 8.4–10.5)
Creatinine, Ser: 0.73 mg/dL (ref 0.50–1.10)
GFR calc Af Amer: >90 mL/min (ref >90)
GFR calc non Af Amer: 90 mL/min — ABNORMAL LOW (ref >90)
Glucose, Bld: 178 mg/dL — ABNORMAL HIGH (ref 70–99)
Potassium: 4 mEq/L (ref 3.7–5.3)
SODIUM: 141 meq/L (ref 137–147)

## 2013-08-12 LAB — GLUCOSE, CAPILLARY
GLUCOSE-CAPILLARY: 147 mg/dL — AB (ref 70–99)
Glucose-Capillary: 138 mg/dL — ABNORMAL HIGH (ref 70–99)
Glucose-Capillary: 156 mg/dL — ABNORMAL HIGH (ref 70–99)
Glucose-Capillary: 130 mg/dL — ABNORMAL HIGH (ref 70–99)

## 2013-08-12 LAB — CBC
HEMATOCRIT: 25.1 % — AB (ref 36.0–46.0)
HEMOGLOBIN: 6.9 g/dL — AB (ref 12.0–15.0)
MCH: 21.4 pg — ABNORMAL LOW (ref 26.0–34.0)
MCHC: 27.5 g/dL — AB (ref 30.0–36.0)
MCV: 77.7 fL — ABNORMAL LOW (ref 78.0–100.0)
Platelets: 306 10*3/uL (ref 150–400)
RBC: 3.23 MIL/uL — ABNORMAL LOW (ref 3.87–5.11)
RDW: 18.6 % — AB (ref 11.5–15.5)
WBC: 17.2 10*3/uL — AB (ref 4.0–10.5)

## 2013-08-12 LAB — PREPARE RBC (CROSSMATCH)

## 2013-08-12 LAB — ABO/RH: ABO/RH(D): O POS

## 2013-08-12 LAB — PROCALCITONIN: PROCALCITONIN: 0.28 ng/mL

## 2013-08-12 MED ORDER — ALTEPLASE 2 MG IJ SOLR
2.0000 mg | Freq: Once | INTRAMUSCULAR | Status: AC
  Administered 2013-08-12: 2 mg
  Filled 2013-08-12: qty 2

## 2013-08-12 MED ORDER — SENNOSIDES-DOCUSATE SODIUM 8.6-50 MG PO TABS: 2.0000 | ORAL_TABLET | Freq: Every evening | ORAL | Status: DC | PRN

## 2013-08-12 MED ORDER — PREDNISONE 20 MG PO TABS
40.0000 mg | ORAL_TABLET | Freq: Every day | ORAL | Status: DC
  Administered 2013-08-13 – 2013-08-16 (×4): 40 mg via ORAL
  Filled 2013-08-12 (×5): qty 2

## 2013-08-12 MED ORDER — LIDOCAINE-EPINEPHRINE (PF) 2 %-1:200000 IJ SOLN
INTRAMUSCULAR | Status: AC
  Administered 2013-08-12: 18:00:00
  Filled 2013-08-12: qty 20

## 2013-08-12 NOTE — Clinical Documentation Improvement (Signed)
Noted abnormal H&H  08/12/13  /  LAB prepare 1 uPRBC Possible Clinical Conditions?                                   Other Condition___________________                 Cannot Clinically Determine_________   Supporting Information: Component     Latest Ref Rng 08/10/2013 08/11/2013 08/12/2013            RBC     3.87 - 5.11 MIL/uL 3.71 (L) 3.27 (L) 3.23 (L)  Hemoglobin     12.0 - 15.0 g/dL 7.8 (L) 7.1 (L) 6.9 (LL)  HCT     36.0 - 46.0 % 28.7 (L) 25.1 (L) 25.1 (L)  MCV     78.0 - 100.0 fL 77.4 (L) 76.8 (L) 77.7 (L)  MCH     26.0 - 34.0 pg 21.0 (L) 21.7 (L) 21.4 (L)  MCHC     30.0 - 36.0 g/dL 38.1 (L) 77.1 (L) 16.5 (L)   Risk Factors: Acute on chronic resp failure, HTN, CHF  Treatment: Lab: 1 UPRBC                     Monitor vs,  H&H trends  Andy Gauss ,RN Clinical Documentation Specialist:  [phone]  [facility]- Health Information Management

## 2013-08-12 NOTE — Consult Note (Signed)
Patient Jacqueline Washington      DOB: 06/26/1950      KNL:[id number]     Consult Note from the Palliative Medicine Team at [facility] Requested by: Dr. Alva Garnet     PCP: Alesia Richards, MD Reason for Consultation: Jacqueline Washington and options.     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 She is on chronic prednisone and oxygen dependent at home on 3-4 lpm. She also has PMH diastolic CHF and diabetes.   I met today with Jacqueline Washington, her husband Jacqueline Washington, daughter Jacqueline Washington, son Jacqueline Washington and his girlfriend, granddaughter Jacqueline Washington, and patient's sister. We had a long frank discussion about her diagnoses (in particular her pulmonary issues) and the natural trajectory and progression of her health. Jacqueline Washington and her family seem to understand that she has complex pulmonary problems that will continue to cause health decline and that are incurable. Jacqueline Washington acknowledges that she knows she is very sick and they say that they have seen her progression over the past few years and the past two weeks have been especially difficult with increasing shortness of breath and weakness. We discussed how she will continue to decline and I introduced the concept of hospice care and also comfort care. They are interested in pursuing hospice care in the home at this point. Jacqueline Washington and her family seem to understand her poor prognosis, however are still in a mode to fight and treat to gain as much length of life she can for now. Jacqueline Washington seems to understand that at some point she needs to consider her quality of life and if it is worth it to her to come back and forth to the hospital or to choose to stay home and focus on her comfort - realizing the medical limitations for treatment of her illnesses.   I also completed MOST form with  Jacqueline Washington who confirmed DNR, she wants limited interventions with admission back to the hospital when warranted (acknowledging that she will consider full comfort as an option), consider the use of antibiotics and IV fluids as indicated, and no feeding tube. I also gave Ms. Araiza Advance Directive packet to Jacqueline Washington and Hard Choices book. Her family say that they will respect her choices. I will continue to support Jacqueline Washington and her family holistically.   Goals of Care: 1.  Code Status: DNR   2. Scope of Treatment: 1. Vital Signs: continue 2. Respiratory/Oxygen: yes 3. Nutritional Support/Tube Feeds: no 4. Antibiotics: yes 5. IVF: yes 6. Review of Medications to be discontinued: Continue all available medical treatment.  7. Labs: yes 8. Telemetry: yes 9. Consults: Spiritual care for support and completion of HCPOA.    4. Disposition: Home with hospice.    3. Symptom Management:   1. Anxiety/Agitation: Consider a trial of low dose Xanax if needed. She does complain of anxiety and states of "panic" at home.  2. Pain: Vicodin prn.  3. Bowel Regimen: Senna S prn.  4. Weakness: Continue medical management. Consider PT when able.   4. Psychosocial: Emotional support provided to Jacqueline Washington and her family during difficult conversation. Jacqueline Washington uses humor to cope and "lighten the mood" during conversation.  5. Spiritual: Family say they are all [place] and religious. Jacqueline Washington says she would appreciate a visit from Jacqueline Washington.  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 Now complicated by high suspicion of aspergillus. She has had worsening respiratory issues (shortness  of breath, increasing oxygen requirements, longer time to recover from activity) with 3 hospital admission in the past 6 months.   ROS: Denies pain, sleep disturbance, nausea, current anxiety (+ history anxiety)    PMH:  Past Medical History  Diagnosis Date  . Asthma   . Emphysema     02 dependent 3L/min  . Emphysema   . Pneumonia   . Hypothyroidism   . Diabetes mellitus, type II   . Obesity   . Diastolic heart failure 09/22/7024    Grade 1  . Emphysema   . Rheumatoid arthritis(714.0)   . Hyperlipidemia   . Hypertension   . Vitamin D deficiency      PSH: Past Surgical History  Procedure Laterality Date  . Tubal ligation    . Colonoscopy    . Video bronchoscopy N/A 11/18/2012    Procedure: VIDEO BRONCHOSCOPY WITH FLUORO;  Surgeon: Elsie Stain, MD;  Location: [facility];  Service: Cardiopulmonary;  Laterality: N/A;  . Eye surgery  at age 39   I have reviewed the [place] and SH and  If appropriate update it with new information. Allergies  Allergen Reactions  . Chantix [Varenicline] Other (See Comments)    Unknown " blurry vision"  . Hctz [Hydrochlorothiazide] Other (See Comments)    unknown  . Penicillins Rash  . Sulfonamide Derivatives Rash  . Tetanus Toxoid Other (See Comments)    Knot on arm   Scheduled Meds: . albuterol  2.5 mg Nebulization Q6H  . Chlorhexidine Gluconate Cloth  6 each Topical Q0600  . enoxaparin (LOVENOX) injection  40 mg Subcutaneous Q24H  . feeding supplement (ENSURE COMPLETE)  237 mL Oral BID BM  . furosemide  40 mg Oral Daily  . insulin aspart  0-9 Units Subcutaneous TID WC  . leflunomide  20 mg Oral Daily  . levofloxacin  750 mg Oral Daily  . methylPREDNISolone (SOLU-MEDROL) injection  40 mg Intravenous Q12H  . mupirocin ointment  1 application Nasal BID  . pantoprazole  40 mg Oral Daily  . potassium chloride SA  60 mEq Oral Daily  . sodium chloride  3 mL Intravenous Q12H  . tiotropium  18 mcg Inhalation Daily  . voriconazole (VFEND  - PT WT > 85 kg) IV  4 mg/kg Intravenous Q12H   Continuous Infusions:  PRN Meds:.sodium chloride, HYDROcodone-acetaminophen, sodium chloride    BP 115/70  Pulse 109  Temp(Src) 98 F (36.7 C) (Oral)  Resp 20  Ht $R'5\' 4"'Vb$  (1.626 m)  Wt 95.2 kg (209 lb 14.1 oz)  BMI 36.01 kg/m2  SpO2 97%   PPS: 40%   Intake/Output Summary (Last 24 hours) at 08/12/13 1419 Last data filed at 08/12/13 0700  Gross per 24 hour  Intake    414 ml  Output    950 ml  Net   -536 ml   LBM: 08/11/13                        Physical Exam:  General: NAD, chronically ill appearing, obese  HEENT: [place]/AT, moist mucous membranes, no JVD Chest: Expiratory wheezes, bibasilar crackles, shallow breathes, no labored breathing currently CVS: RRR, S1 S2 Abdomen: Soft, NT, ND, +BS, obese Ext: Trace BLE edema, MAE, warm to touch Neuro: Alert & oriented x 4, follows commands  Labs: CBC    Component Value Date/Time   WBC 17.2* 08/12/2013 0315   RBC 3.23* 08/12/2013 0315   HGB 6.9* 08/12/2013 0315   HCT 25.1* 08/12/2013 0315   PLT 306 08/12/2013 0315   MCV 77.7* 08/12/2013 0315   MCH 21.4* 08/12/2013 0315   MCHC 27.5* 08/12/2013 0315   RDW 18.6* 08/12/2013 0315   LYMPHSABS 0.4* 08/11/2013 0308   MONOABS 0.3 08/11/2013 0308   EOSABS 0.0 08/11/2013 0308   BASOSABS 0.0 08/11/2013 0308    BMET    Component Value Date/Time   NA 141 08/12/2013 0315   K 4.0 08/12/2013 0315   CL 102 08/12/2013 0315   CO2 27 08/12/2013 0315   GLUCOSE 178* 08/12/2013 0315   BUN 21 08/12/2013 0315   CREATININE 0.73 08/12/2013 0315   CREATININE 0.86 08/03/2013 1013   CALCIUM 8.9 08/12/2013 0315   GFRNONAA 90* 08/12/2013 0315   GFRAA >90 08/12/2013 0315    CMP     Component Value Date/Time   NA 141 08/12/2013 0315   K 4.0 08/12/2013 0315   CL 102 08/12/2013 0315   CO2 27 08/12/2013 0315   GLUCOSE 178* 08/12/2013 0315   BUN 21 08/12/2013 0315   CREATININE 0.73 08/12/2013 0315   CREATININE 0.86 08/03/2013 1013   CALCIUM 8.9 08/12/2013 0315   PROT 7.1  08/10/2013 2241   ALBUMIN 3.2* 08/03/2013 1013   AST 12 08/03/2013 1013   ALT 13 08/03/2013 1013   ALKPHOS 71 08/03/2013 1013   BILITOT 0.4 08/03/2013 1013   GFRNONAA 90* 08/12/2013 0315   GFRAA >90 08/12/2013 0315     Time In Time Out Total Time Spent with Patient Total Overall Time  1230 1400 65min 64min    Greater than 50%  of this time was spent counseling and coordinating care related to the above assessment and plan.  Vinie Sill, NP Palliative Medicine Team Pager # [phone] (M-F 8a-5p) Team Phone # [phone] (Nights/Weekends)

## 2013-08-12 NOTE — Progress Notes (Signed)
   Name: Jacqueline Washington MRN: [id number] DOB: 28-Mar-1951    ADMISSION DATE:  08/10/2013 CONSULTATION DATE:  2/25  REFERRING MD : Micheline Maze  PRIMARY SERVICE: [facility] MD Azzie Roup.   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 Presented to ER on 2/25 w/ essentially 4 weeks of progressive dyspnea w/ evolving pleuritic type CP on left. PCCM asked to assist w/ care.   SIGNIFICANT EVENTS / STUDIES:  CT chest 2/25: severe chronic lung disease with emphysema, pulmonary fibrosis, traction bronchiectasis and multiple lung cavities. There is an enlarging left-sided pleural effusion with overlying atelectasis. There is also new airspace opacity in the left upper lobe posteriorly Left upper lobe cavitary lesion concerning for fungal bal.   LINES / TUBES: R PICC 1/26 (non functioning)>>>  CULTURES: Fungal smear sputum 2/25>>> Aspergillus galactomannan antigen 2/25>>> Aspergillus antibody 2/26 >>> Sputum 2/25>>> MRSA PCR 2/25 > POS Urine Strep 2/25 >>> POS BCx2 2/25 >>> Sputum 2/25 >>> Sputum fungal 2/25 >>>  ANTIBIOTICS: vanc 2/25>2/26 azactam 2/25>2/25 Levaquin 2/25 >>> voriconazole 2/26 >>>  INTERVAL HISTORY:  Feels about the same as yesterday. Palliative care visit today.   VITAL SIGNS: Temp:  [97.3 F (36.3 C)-98.4 F (36.9 C)] 98 F (36.7 C) (02/27 1200) Pulse Rate:  [102-109] 109 (02/27 0320) Resp:  [18-25] 20 (02/27 0320) BP: (108-137)/(65-93) 115/70 mmHg (02/27 0320) SpO2:  [96 %-98 %] 97 % (02/27 0906)  PHYSICAL EXAMINATION: General:  Chronically ill appearing 63 year old female, not currently in acute distress  Neuro:  Awake,  alert, no focal def  HEENT:  [place], no JVD, mmm  Cardiovascular:  rrr Lungs:  Exp wz, crackles bilateral bases.  Abdomen:  Obese + bowel sounds  Musculoskeletal:  Intact  Skin:  LE edema    I have reviewed all of today's lab results. 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  PLAN/RECS: -supplemental O2 as needed to keep O2 sat > 90% -ABX as above -Voriconazole load IV then transition to PO -will need to go to radiology for placement of another PICC -Transfuse 1 unit PRBC for HGB 6.9 2/27 -f/u aspergillus galactomannan antigen and aspergillus antibody.  -f/u cultures  -Transition back to oral steroids 2/28. Goal is ultimately to get down to 20 mg/d or less -lasix as tolerated, trend BMP -Eventual goal to go home on home hospice, simplify medication regimen - have DC'd montelukast and allopurinol. -Likely able to transfer out of ICU 2/28  Goals of care discussed with family members and they seem agreeable to home hospice and DNR.   PCCM will be available PRN over WE and will see again Mon 3/2. Please call sooner if needed  03-17-1998, MD ; [facility] [phone].  After 5:30 PM or weekends, call [phone]  Seen with: [phone], ACNP [facility] Pulmonology/Critical Care Pager [phone] or [phone]  08/12/2013, 2:13 PM

## 2013-08-12 NOTE — Progress Notes (Addendum)
Pt just returned from Radiology having a right IJ placed. Pt has blood ready in blood bank. Due to continuity of care, pt will have blood given on night shift, beginning 50 minutes from now rather than branching over into shift change reporting, risking noncompliance with current blood transfusion policies. Pt is alert, orientedx4, in NAD, and asymptomatic in spite of current Hgb of 6.9. Will continue to monitor.   Claudette Head, MSN, RN, FNP-BC      at [phone] on 08/12/13

## 2013-08-12 NOTE — Progress Notes (Signed)
Came to visit patient at bedside to make aware that [facility] Care Management to follow post hospital discharge. Noted, palliative care meeting scheduled for 1230 today. Will continue to follow along. Left contact information at bedside. Appreciative of visit.  Raiford Noble, MSN- RN,BSN- [facility] [phone]

## 2013-08-12 NOTE — Progress Notes (Signed)
CRITICAL VALUE ALERT  Critical value received:  Hgb 6.9   Date of notification:  08/12/13  Time of notification:  0432  Critical value read back:yes  Nurse who received alert:  S.Young,RN  MD notified (1st page):  Janyth Contes  Time of first page:  [phone]  MD notified (2nd page):  Time of second page:  Responding MD:    Time MD responded:  Notified via text page, no further orders received

## 2013-08-12 NOTE — Procedures (Signed)
Successful placement of right IJ approach dual lumen tunneled PICC line with tip at the superior caval-atrial junction.  The PICC line is ready for immediate use.

## 2013-08-12 NOTE — Progress Notes (Signed)
[facility] PROGRESS NOTE  Jacqueline Washington LKJ:[id number] DOB: 1951-04-24 DOA: 08/10/2013 PCP: Nadean Corwin, MD  Assessment/Plan: 1. Acute on chronic respiratory failure- multifactorial, on antibiotics  Has been changed to Levaquin  Fungal work up is pending, will start the antifungals if positive.started on Solu-Medrol 40 g IV every 12 hours per pulmonary 2. Pleural effusion- repeat chest x-ray did not show effusion. No thoracentesis at this time 3. Pulmonary fibrosis- She has h/o rheumatoid arthritis, continue with Solu-Medrol 4. Grade 1 diastolic dysfunction- she has h/o grade 1 diastolic dysfunction,continue with lasix 40 mg po daliy. BNP is elevated to 1099. 5. Cavitary pulmonary aspergillosis- continue voriconazole. 6. PICC line placement- patient will again go to cardiology for replacement of PICC line. 7. Diabetes mellitus- Continue with SSI, Blood glucose is well controlled. 8. Goals of care discussion- Dr. Sung Amabile discussed with the family and recommended palliative care consultation as patient has advanced lung disease.  Code Status: DNR Family Communication: Discussed with daughter at bedside Disposition Plan: Home when stable   Consultants:  pulmonary  Procedures:  None  Antibiotics:  Azactam 2/25>>2/26  Levaquin 2/25>>  Vancomycin 2/25>>2/26  HPI/Subjective: Patient seen and examined, feels better today. Says her breathing has somewhat improved. Picc line was attempted by radiology yesterday, but not functioning.   Objective: Filed Vitals:   08/12/13 0800  BP:   Pulse:   Temp: 97.3 F (36.3 C)  Resp:     Intake/Output Summary (Last 24 hours) at 08/12/13 1015 Last data filed at 08/12/13 0700  Gross per 24 hour  Intake    424 ml  Output   1350 ml  Net   -926 ml   Filed Weights   08/10/13 2326  Weight: 95.2 kg (209 lb 14.1 oz)    Exam:       Physical Exam: Head: Normocephalic, atraumatic.  Eyes: No signs of jaundice,  EOMI Nose: Mucous membranes dry.  Throat: Oropharynx nonerythematous, no exudate appreciated.  Neck: supple,No deformities, masses, or tenderness noted. Lungs: Bilateral rhonchi Heart: Regular RR. S1 and S2 normal  Abdomen: BS normoactive. Soft, Nondistended, non-tender.  Extremities: No pretibial edema, no erythema   Data Reviewed: Basic Metabolic Panel:  Recent Labs Lab 08/10/13 1554 08/10/13 2241 08/11/13 0308 08/12/13 0315  NA 138  --  139 141  K 4.4  --  3.8 4.0  CL 94*  --  99 102  CO2 27  --  27 27  GLUCOSE 204*  --  164* 178*  BUN 14  --  12 21  CREATININE 0.82 0.82 0.69 0.73  CALCIUM 9.3  --  8.9 8.9   Liver Function Tests:  Recent Labs Lab 08/10/13 2241  PROT 7.1   No results found for this basename: LIPASE, AMYLASE,  in the last 168 hours No results found for this basename: AMMONIA,  in the last 168 hours CBC:  Recent Labs Lab 08/10/13 1554 08/11/13 0308 08/12/13 0315  WBC 16.0* 10.8* 17.2*  NEUTROABS 13.9* 10.2*  --   HGB 7.8* 7.1* 6.9*  HCT 28.7* 25.1* 25.1*  MCV 77.4* 76.8* 77.7*  PLT 303 277 306   Cardiac Enzymes:  Recent Labs Lab 08/10/13 1554  TROPONINI <0.30   BNP (last 3 results)  Recent Labs  03/25/13 0601 04/14/13 2300 08/10/13 1554  PROBNP 1150.0* 301.6* 1099.0*   CBG:  Recent Labs Lab 08/11/13 0844 08/11/13 1308 08/11/13 1716 08/11/13 2247 08/12/13 0736  GLUCAP 167* 94 118* 164* 138*    Recent Results (from the past  240 hour(s))  MRSA PCR SCREENING     Status: Abnormal   Collection Time    08/10/13 11:40 PM      Result Value Ref Range Status   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 It is not     intended to diagnose MRSA     infection nor to guide or     monitor treatment for     MRSA infections.     RESULT CALLED TO, READ  BACK BY AND VERIFIED WITH:     SPOKE WITH AYERS,C RN [phone] [place],N     Studies: Dg Chest 1 View  08/11/2013   CLINICAL DATA:  63 year old female with PICC line placement.  EXAM: CHEST - 1 VIEW  COMPARISON:  08/11/2013 chest radiograph  FINDINGS: A right PICC line is present with tip overlying the mid -upper SVC.  Chronic Lung disease with scarring/ fibrosis and interstitial prominence again noted.  There is no evidence of pneumothorax.  No other significant changes are present.  IMPRESSION: Right PICC line with tip overlying the mid-upper SVC.  Chronic pulmonary changes.   Electronically Signed   By: Laveda Abbe M.D.   On: 08/11/2013 19:24   Ct Chest Wo Contrast  08/10/2013   CLINICAL DATA:  Left-sided chest pain.  Respiratory distress.  EXAM: CT CHEST WITHOUT CONTRAST  TECHNIQUE: Multidetector CT imaging of the chest was performed following the standard protocol without IV contrast.  COMPARISON:  03/24/2013  FINDINGS: The chest wall is unremarkable. No breast masses, supraclavicular or axillary lymphadenopathy. The bony thorax is intact. No destructive bone lesions or spinal canal compromise. Moderate degenerative changes noted in the thoracic spine and mild-to-moderate osteoporosis.  The heart is normal in size. No pericardial effusion. No mediastinal or hilar mass or adenopathy. Small scattered lymph nodes are stable. The aorta demonstrates scattered atherosclerotic calcifications. The esophagus is grossly normal.  Examination of the lung parenchyma demonstrates severe chronic lung disease with emphysema, pulmonary fibrosis, traction bronchiectasis and multiple lung cavities. There is an enlarging left-sided pleural effusion with overlying atelectasis. There is also new airspace opacity in the left upper lobe posteriorly which could be a superimposed pneumonia.  The upper abdomen is unremarkable.  IMPRESSION: Severe chronic lung disease as discussed above with suspected superimposed left upper  lobe pneumonia.   Electronically Signed   By: Loralie Champagne M.D.   On: 08/10/2013 17:50   Ir Fluoro Guide Cv Line Right  08/11/2013   CLINICAL DATA:  Rheumatoid arthritis  EXAM: Right upper extremity PICC LINE PLACEMENT WITH ULTRASOUND AND FLUOROSCOPIC GUIDANCE  FLUOROSCOPY TIME:  24 seconds.  PROCEDURE: The patient was advised of the possible risks andcomplications and agreed to undergo the procedure. The patient was then brought to the angiographic suite for the procedure.  The right arm was prepped with chlorhexidine, drapedin the usual sterile fashion using maximum barrier technique (cap and mask, sterile gown, sterile gloves, large sterile sheet, hand hygiene and cutaneous antisepsis) and infiltrated locally with 1% Lidocaine.  Ultrasound demonstrated patency of the right basilic vein, and this was documented with an image. Under real-time ultrasound guidance, this vein was accessed with a 21 gauge micropuncture needle and image documentation was performed. A  0.018 wire was introduced in to the vein. Over this, a 5 [place] double lumen Power PICC was advanced to the lower SVC/right atrial junction. Fluoroscopy during the procedure and fluoro spot radiograph confirms appropriate catheter position. The catheter was flushed and covered with asterile dressing.  Complications: None.  IMPRESSION: Successful right arm Power PICC line placement with ultrasound and fluoroscopic guidance. The catheter is ready for use.   Electronically Signed   By: Maryclare Bean M.D.   On: 08/11/2013 12:40   Ir Chest Fluoro  08/12/2013   CLINICAL DATA:  PICC occluded  EXAM: CHEST FLUOROSCOPY  PROCEDURE: The right PICC entry site was prepped and draped in a sterile fashion. Fluoroscopic examination over the right arm demonstrates that the PICC is redundant and kinked in the subcutaneous fat. The PICC was retracted. A wire was inserted. The PICC was then advanced to the cavoatrial junction. It was flushed then secured in place.   FINDINGS: Tip of the PICC is now at the cavoatrial junction.  IMPRESSION: Successful manipulation of the right upper extremity PICC under sterile technique. The tip is now at the cavoatrial junction and it is functioning normally.   Electronically Signed   By: Maryclare Bean M.D.   On: 08/12/2013 08:39   Ir US Guide Vasc Access Right  08/11/2013   CLINICAL DATA:  Rheumatoid arthritis  EXAM: Right upper extremity PICC LINE PLACEMENT WITH ULTRASOUND AND FLUOROSCOPIC GUIDANCE  FLUOROSCOPY TIME:  24 seconds.  PROCEDURE: The patient was advised of the possible risks andcomplications and agreed to undergo the procedure. The patient was then brought to the angiographic suite for the procedure.  The right arm was prepped with chlorhexidine, drapedin the usual sterile fashion using maximum barrier technique (cap and mask, sterile gown, sterile gloves, large sterile sheet, hand hygiene and cutaneous antisepsis) and infiltrated locally with 1% Lidocaine.  Ultrasound demonstrated patency of the right basilic vein, and this was documented with an image. Under real-time ultrasound guidance, this vein was accessed with a 21 gauge micropuncture needle and image documentation was performed. A 0.018 wire was introduced in to the vein. Over this, a 5 [place] double lumen Power PICC was advanced to the lower SVC/right atrial junction. Fluoroscopy during the procedure and fluoro spot radiograph confirms appropriate catheter position. The catheter was flushed and covered with asterile dressing.  Complications: None.  IMPRESSION: Successful right arm Power PICC line placement with ultrasound and fluoroscopic guidance. The catheter is ready for use.   Electronically Signed   By: Maryclare Bean M.D.   On: 08/11/2013 12:40   Dg Chest Port 1 View  08/11/2013   CLINICAL DATA:  Cough.  EXAM: PORTABLE CHEST - 1 VIEW  COMPARISON:  CT CHEST W/O CM dated 08/10/2013; DG CHEST 1V PORT dated 08/10/2013; DG CHEST 2 VIEW dated 08/03/2013; DG CHEST 1V PORT dated  03/23/2013  FINDINGS: Mediastinum is stable. Severe biapical pleural parenchymal thickening and interstitial prominence noted with retraction of the hilum consistent with scarring. Pulmonary density in the left upper lobe although possibly related to scarring has progressed from prior year's exam. Chest CT suggested for further evaluation. Heart size normal. Changes of pleural scarring left base . No pneumothorax. No acute osseous abnormality.  IMPRESSION: 1. Biapical severe pleural parenchymal scarring and interstitial fibrosis. 2. Left upper lung pulmonary density has progressed in size over prior year. Chest CT is suggested for further evaluation   Electronically Signed   By: Maisie Fus  Register   On: 08/11/2013 09:42   Dg  Chest Port 1 View  08/10/2013   CLINICAL DATA:  resp distress  EXAM: PORTABLE CHEST - 1 VIEW  COMPARISON:  DG CHEST 2 VIEW dated 08/03/2013; DG CHEST 2 VIEW dated 10/23/2012; DG CHEST 1V PORT dated 03/22/2013  FINDINGS: The interstitial findings within the right and left lung apices are stable when compared to the previous study and when correlated with prior imaging. Hilar retraction is once again appreciated. No new focal regions of consolidation or new focal infiltrates appreciated. Cardiac silhouette is within normal limits. There is blunting of the left costophrenic angle also stable there is prominence of the interstitial markings within the remaining aerated portions of the lungs which appears stable. No new focal regions of consolidation or new focal infiltrates.  IMPRESSION: Stable chronic changes within the right and left lung apices as described above and left lung base.   Electronically Signed   By: Salome Holmes M.D.   On: 08/10/2013 16:32    Scheduled Meds: . albuterol  2.5 mg Nebulization Q6H  . Chlorhexidine Gluconate Cloth  6 each Topical Q0600  . enoxaparin (LOVENOX) injection  40 mg Subcutaneous Q24H  . feeding supplement (ENSURE COMPLETE)  237 mL Oral BID BM  .  furosemide  40 mg Oral Daily  . insulin aspart  0-9 Units Subcutaneous TID WC  . leflunomide  20 mg Oral Daily  . levofloxacin  750 mg Oral Daily  . methylPREDNISolone (SOLU-MEDROL) injection  40 mg Intravenous Q12H  . mupirocin ointment  1 application Nasal BID  . pantoprazole  40 mg Oral Daily  . potassium chloride SA  60 mEq Oral Daily  . sodium chloride  3 mL Intravenous Q12H  . tiotropium  18 mcg Inhalation Daily  . voriconazole (VFEND - PT WT > 85 kg) IV  4 mg/kg Intravenous Q12H   Continuous Infusions:   Principal Problem:   HCAP (healthcare-associated pneumonia) Active Problems:   Copd Gold C    Chronic diastolic heart failure, NYHA class 1   Diabetes mellitus, type II   Morbid obesity   Rheumatoid arthritis(714.0)   Hypertension   Acute on chronic respiratory failure with hypoxia   Pulmonary aspergillosis    Time spent: 25 min    [facility]  [facility] Pager [phone]*. If 7PM-7AM, please contact night-coverage at [email or web address], password [facility] 08/12/2013, 10:15 AM  LOS: 2 days

## 2013-08-12 NOTE — Progress Notes (Signed)
[id number]/Blayton Huttner [place], RN, BSN, CCM, [phone] Chart reviewed for update of needs and condition. 

## 2013-08-13 ENCOUNTER — Inpatient Hospital Stay (HOSPITAL_COMMUNITY): Payer: Medicare Other

## 2013-08-13 DIAGNOSIS — Z66 Do not resuscitate: Secondary | ICD-10-CM

## 2013-08-13 LAB — BASIC METABOLIC PANEL
BUN: 21 mg/dL (ref 6–23)
CO2: 31 mEq/L (ref 19–32)
CREATININE: 0.74 mg/dL (ref 0.50–1.10)
Calcium: 8.8 mg/dL (ref 8.4–10.5)
Chloride: 102 mEq/L (ref 96–112)
GFR, EST NON AFRICAN AMERICAN: 89 mL/min — AB (ref >90)
GLUCOSE: 151 mg/dL — AB (ref 70–99)
POTASSIUM: 4.1 meq/L (ref 3.7–5.3)
Sodium: 144 mEq/L (ref 137–147)

## 2013-08-13 LAB — GLUCOSE, CAPILLARY
Glucose-Capillary: 122 mg/dL — ABNORMAL HIGH (ref 70–99)
Glucose-Capillary: 162 mg/dL — ABNORMAL HIGH (ref 70–99)
Glucose-Capillary: 154 mg/dL — ABNORMAL HIGH (ref 70–99)
Glucose-Capillary: 112 mg/dL — ABNORMAL HIGH (ref 70–99)

## 2013-08-13 LAB — ASPERGILLUS GALACTOMANNAN ANTIGEN
Aspergillus galactomannan Ag: NOT DETECTED
Aspergillus galactomannan Index: 0.11 (ref <0.50)

## 2013-08-13 LAB — CBC
HEMATOCRIT: 27.9 % — AB (ref 36.0–46.0)
HEMOGLOBIN: 8 g/dL — AB (ref 12.0–15.0)
MCH: 23 pg — AB (ref 26.0–34.0)
MCHC: 28.7 g/dL — AB (ref 30.0–36.0)
MCV: 80.2 fL (ref 78.0–100.0)
Platelets: 257 10*3/uL (ref 150–400)
RBC: 3.48 MIL/uL — ABNORMAL LOW (ref 3.87–5.11)
RDW: 18.3 % — AB (ref 11.5–15.5)
WBC: 12.8 10*3/uL — ABNORMAL HIGH (ref 4.0–10.5)

## 2013-08-13 NOTE — Progress Notes (Signed)
Patient's daughter concerned about patient hallucinating.  States patient hallucinates after getting OOB to bedside commode.  Patient states she sees "a movie playing on the wall but no one else sees it".  Patient's daughter concerned that hallucinations are due to having to use 100% NRB when getting OOB.   Patient alert and oriented x4 at this time. Will continue to monitor.

## 2013-08-13 NOTE — Progress Notes (Signed)
[facility] PROGRESS NOTE  Jacqueline Washington DZH:[id number] DOB: [birth year] DOA: 08/10/2013 PCP: Alesia Richards, MD  Assessment/Plan: 1. Acute on chronic respiratory failure- multifactorial, on antibiotics  Has been changed to Levaquin  Fungal work up is pending, will start the antifungals if positive.started on Solu-Medrol 40 g IV every 12 hours per pulmonary 2. Pleural effusion- repeat chest x-ray did not show effusion. No thoracentesis at this time 3. Pulmonary fibrosis- She has h/o rheumatoid arthritis, continue with Solu-Medrol 4. Grade 1 diastolic dysfunction- she has h/o grade 1 diastolic dysfunction,continue with lasix 40 mg po daliy. BNP is elevated to 1099. 5. Cavitary pulmonary aspergillosis- continue voriconazole. 6. PICC line placement- patient will again go to cardiology for replacement of PICC line. 7. Diabetes mellitus- Continue with SSI, Blood glucose is well controlled. 8. Goals of care discussion- Palliative care discussed with family and patient, at this time they would like to go home with home hospice.  Code Status: DNR Family Communication: Discussed with daughter at bedside Disposition Plan: Home when stable   Consultants:  pulmonary  Procedures:  None  Antibiotics:  Azactam 2/25>>2/26  Levaquin 2/25>>  Vancomycin 2/25>>2/26  HPI/Subjective: Patient seen and examined, feels better today. Says her breathing has somewhat improved. Family would like to go home with hospice,  Objective: Filed Vitals:   08/13/13 0800  BP: 132/94  Pulse: 103  Temp: 98.3 F (36.8 C)  Resp: 28    Intake/Output Summary (Last 24 hours) at 08/13/13 1112 Last data filed at 08/13/13 1000  Gross per 24 hour  Intake 1228.42 ml  Output    615 ml  Net 613.42 ml   Filed Weights   08/10/13 2326 08/13/13 0500  Weight: 95.2 kg (209 lb 14.1 oz) 97.5 kg (214 lb 15.2 oz)    Exam:       Physical Exam: Head: Normocephalic, atraumatic.  Eyes: No signs of  jaundice, EOMI Nose: Mucous membranes dry.  Throat: Oropharynx nonerythematous, no exudate appreciated.  Neck: supple,No deformities, masses, or tenderness noted. Lungs: Bilateral rhonchi Heart: Regular RR. S1 and S2 normal  Abdomen: BS normoactive. Soft, Nondistended, non-tender.  Extremities: No pretibial edema, no erythema   Data Reviewed: Basic Metabolic Panel:  Recent Labs Lab 08/10/13 1554 08/10/13 2241 08/11/13 0308 08/12/13 0315 08/13/13 0545  NA 138  --  139 141 144  K 4.4  --  3.8 4.0 4.1  CL 94*  --  99 102 102  CO2 27  --  _0 GLUCOSE 204*  --  164* 178* 151*  BUN 14  --  _1 CREATININE 0.82 0.82 0.69 0.73 0.74  CALCIUM 9.3  --  8.9 8.9 8.8   Liver Function Tests:  Recent Labs Lab 08/10/13 2241  PROT 7.1   No results found for this basename: LIPASE, AMYLASE,  in the last 168 hours No results found for this basename: AMMONIA,  in the last 168 hours CBC:  Recent Labs Lab 08/10/13 1554 08/11/13 0308 08/12/13 0315 08/13/13 0545  WBC 16.0* 10.8* 17.2* 12.8*  NEUTROABS 13.9* 10.2*  --   --   HGB 7.8* 7.1* 6.9* 8.0*  HCT 28.7* 25.1* 25.1* 27.9*  MCV 77.4* 76.8* 77.7* 80.2  PLT 303 277 306 257   Cardiac Enzymes:  Recent Labs Lab 08/10/13 1554  TROPONINI <0.30   BNP (last 3 results)  Recent Labs  03/25/13 0601 04/14/13 2300 08/10/13 1554  PROBNP 1150.0* 301.6* 1099.0*   CBG:  Recent Labs Lab 08/12/13 0736  08/12/13 1144 08/12/13 1854 08/12/13 2309 08/13/13 0731  GLUCAP 138* 147* 156* 130* 122*    Recent Results (from the past 240 hour(s))  CULTURE, BLOOD (ROUTINE X 2)     Status: None   Collection Time    08/10/13  8:35 PM      Result Value Ref Range Status   Specimen Description BLOOD LEFT WRIST   Final   Special Requests BOTTLES DRAWN AEROBIC AND ANAEROBIC 2.5ML   Final   Culture  Setup Time     Final   Value: 08/11/2013 01:18     Performed at [facility]   Culture     Final   Value:        BLOOD  CULTURE RECEIVED NO GROWTH TO DATE CULTURE WILL BE HELD FOR 5 DAYS BEFORE ISSUING A FINAL NEGATIVE REPORT     Performed at [facility]   Report Status PENDING   Incomplete  MRSA PCR SCREENING     Status: Abnormal   Collection Time    08/10/13 11:40 PM      Result Value Ref Range Status   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 It is not     intended to diagnose MRSA     infection nor to guide or     monitor treatment for     MRSA infections.     RESULT CALLED TO, READ BACK BY AND VERIFIED WITH:     SPOKE WITH AYERS,C RN [phone] [place],N  CULTURE, BLOOD (ROUTINE X 2)     Status: None   Collection Time    08/10/13 11:45 PM      Result Value Ref Range Status   Specimen Description BLOOD LEFT HAND   Final   Special Requests BOTTLES DRAWN AEROBIC AND ANAEROBIC 5CC   Final   Culture  Setup Time     Final   Value: 08/11/2013 04:06     Performed at [facility]   Culture     Final   Value:        BLOOD CULTURE RECEIVED NO GROWTH TO DATE CULTURE WILL BE HELD FOR 5 DAYS BEFORE ISSUING A FINAL NEGATIVE REPORT     Performed at [facility]   Report Status PENDING   Incomplete     Studies: Dg Chest 1 View  08/11/2013   CLINICAL DATA:  63 year old female with PICC line placement.  EXAM: CHEST - 1 VIEW  COMPARISON:  08/11/2013 chest radiograph  FINDINGS: A right PICC line is present with tip overlying the mid -upper SVC.  Chronic Lung disease with scarring/ fibrosis and interstitial prominence again noted.  There is no evidence of pneumothorax.  No other significant changes are present.  IMPRESSION: Right PICC line with tip overlying the mid-upper SVC.  Chronic pulmonary changes.   Electronically Signed   By: Hassan Rowan M.D.   On: 08/11/2013 19:24   Ir Fluoro Guide Cv Line  Right  08/12/2013   INDICATION: Malfunctioning right upper extremity approach PICC line; in need of intravenous access for medication administration and blood draws.  EXAM: TUNNELED PICC PLACEMENT WITH ULTRASOUND AND FLUOROSCOPIC GUIDANCE  MEDICATIONS: The patient is currently admitted to the  hospital and receiving intravenous antibiotics; The IV antibiotic was given in an appropriate time interval prior to skin puncture.  ANESTHESIA/SEDATION: None  CONTRAST:  None  FLUOROSCOPY TIME:  12 seconds.  PROCEDURE: Informed written consent was obtained from the patient after a discussion of the risks, benefits, and alternatives to treatment. Questions regarding the procedure were encouraged and answered. The right neck and chest were prepped with chlorhexidine in a sterile fashion, and a sterile drape was applied covering the operative field. Maximum barrier sterile technique with sterile gowns and gloves were used for the procedure. A timeout was performed prior to the initiation of the procedure.  After creating a small venotomy incision, a micropuncture kit was utilized to access the right internal jugular vein under direct, real-time ultrasound guidance after the overlying soft tissues were anesthetized with 1% lidocaine with epinephrine. Ultrasound image documentation was performed. The microwire was kinked to measure appropriate catheter length. A stiff Glidewire was advanced to the level of the IVC and the micropuncture sheath was exchanged for a peel-away sheath. A dual lumen power PICC measuring 24 cm from tip to cuff was tunneled in a retrograde fashion from the anterior chest wall to the venotomy incision.  The catheter was then placed through the peel-away sheath with tips ultimately positioned within the superior aspect of the right atrium. Final catheter positioning was confirmed and documented with a spot radiographic image. The catheter aspirates and flushes normally. The catheter was flushed with  appropriate volume heparin dwells.  The catheter exit site was secured with a 0-Prolene retention suture. The venotomy incision was closed with Dermabond and Steri-strips. Dressings were applied. The patient tolerated the procedure well without immediate post procedural complication.  The malfunctioning right upper extremity approach PICC line was then removed at the bedside.  COMPLICATIONS: None immediate  IMPRESSION: 1. Successful placement of 24 cm tip to cuff tunneled PICC via the right internal jugular vein with tip terminating within the superior aspect of the right atrium. The catheter is ready for immediate use. 2. Successful removal of right upper extremity approach PICC line.   Electronically Signed   By: Sandi Mariscal M.D.   On: 08/12/2013 18:23   Ir Fluoro Guide Cv Line Right  08/11/2013   CLINICAL DATA:  Rheumatoid arthritis  EXAM: Right upper extremity PICC LINE PLACEMENT WITH ULTRASOUND AND FLUOROSCOPIC GUIDANCE  FLUOROSCOPY TIME:  24 seconds.  PROCEDURE: The patient was advised of the possible risks andcomplications and agreed to undergo the procedure. The patient was then brought to the angiographic suite for the procedure.  The right arm was prepped with chlorhexidine, drapedin the usual sterile fashion using maximum barrier technique (cap and mask, sterile gown, sterile gloves, large sterile sheet, hand hygiene and cutaneous antisepsis) and infiltrated locally with 1% Lidocaine.  Ultrasound demonstrated patency of the right basilic vein, and this was documented with an image. Under real-time ultrasound guidance, this vein was accessed with a 21 gauge micropuncture needle and image documentation was performed. A 0.018 wire was introduced in to the vein. Over this, a 5 [place] double lumen Power PICC was advanced to the lower SVC/right atrial junction. Fluoroscopy during the procedure and fluoro spot radiograph confirms appropriate catheter position. The catheter was flushed and covered with  asterile dressing.  Complications: None.  IMPRESSION: Successful right arm Power PICC line placement with ultrasound and fluoroscopic guidance. The catheter is ready for use.   Electronically Signed   By: Maryclare Bean M.D.   On: 08/11/2013 12:40  Ir Chest Fluoro  08/12/2013   CLINICAL DATA:  PICC occluded  EXAM: CHEST FLUOROSCOPY  PROCEDURE: The right PICC entry site was prepped and draped in a sterile fashion. Fluoroscopic examination over the right arm demonstrates that the PICC is redundant and kinked in the subcutaneous fat. The PICC was retracted. A wire was inserted. The PICC was then advanced to the cavoatrial junction. It was flushed then secured in place.  FINDINGS: Tip of the PICC is now at the cavoatrial junction.  IMPRESSION: Successful manipulation of the right upper extremity PICC under sterile technique. The tip is now at the cavoatrial junction and it is functioning normally.   Electronically Signed   By: Maryclare Bean M.D.   On: 08/12/2013 08:39   Ir US Guide Vasc Access Right  08/12/2013   INDICATION: Malfunctioning right upper extremity approach PICC line; in need of intravenous access for medication administration and blood draws.  EXAM: TUNNELED PICC PLACEMENT WITH ULTRASOUND AND FLUOROSCOPIC GUIDANCE  MEDICATIONS: The patient is currently admitted to the hospital and receiving intravenous antibiotics; The IV antibiotic was given in an appropriate time interval prior to skin puncture.  ANESTHESIA/SEDATION: None  CONTRAST:  None  FLUOROSCOPY TIME:  12 seconds.  PROCEDURE: Informed written consent was obtained from the patient after a discussion of the risks, benefits, and alternatives to treatment. Questions regarding the procedure were encouraged and answered. The right neck and chest were prepped with chlorhexidine in a sterile fashion, and a sterile drape was applied covering the operative field. Maximum barrier sterile technique with sterile gowns and gloves were used for the procedure. A  timeout was performed prior to the initiation of the procedure.  After creating a small venotomy incision, a micropuncture kit was utilized to access the right internal jugular vein under direct, real-time ultrasound guidance after the overlying soft tissues were anesthetized with 1% lidocaine with epinephrine. Ultrasound image documentation was performed. The microwire was kinked to measure appropriate catheter length. A stiff Glidewire was advanced to the level of the IVC and the micropuncture sheath was exchanged for a peel-away sheath. A dual lumen power PICC measuring 24 cm from tip to cuff was tunneled in a retrograde fashion from the anterior chest wall to the venotomy incision.  The catheter was then placed through the peel-away sheath with tips ultimately positioned within the superior aspect of the right atrium. Final catheter positioning was confirmed and documented with a spot radiographic image. The catheter aspirates and flushes normally. The catheter was flushed with appropriate volume heparin dwells.  The catheter exit site was secured with a 0-Prolene retention suture. The venotomy incision was closed with Dermabond and Steri-strips. Dressings were applied. The patient tolerated the procedure well without immediate post procedural complication.  The malfunctioning right upper extremity approach PICC line was then removed at the bedside.  COMPLICATIONS: None immediate  IMPRESSION: 1. Successful placement of 24 cm tip to cuff tunneled PICC via the right internal jugular vein with tip terminating within the superior aspect of the right atrium. The catheter is ready for immediate use. 2. Successful removal of right upper extremity approach PICC line.   Electronically Signed   By: Sandi Mariscal M.D.   On: 08/12/2013 18:23   Ir US Guide Vasc Access Right  08/11/2013   CLINICAL DATA:  Rheumatoid arthritis  EXAM: Right upper extremity PICC LINE PLACEMENT WITH ULTRASOUND AND FLUOROSCOPIC GUIDANCE   FLUOROSCOPY TIME:  24 seconds.  PROCEDURE: The patient was advised of the possible risks andcomplications and  agreed to undergo the procedure. The patient was then brought to the angiographic suite for the procedure.  The right arm was prepped with chlorhexidine, drapedin the usual sterile fashion using maximum barrier technique (cap and mask, sterile gown, sterile gloves, large sterile sheet, hand hygiene and cutaneous antisepsis) and infiltrated locally with 1% Lidocaine.  Ultrasound demonstrated patency of the right basilic vein, and this was documented with an image. Under real-time ultrasound guidance, this vein was accessed with a 21 gauge micropuncture needle and image documentation was performed. A 0.018 wire was introduced in to the vein. Over this, a 5 [place] double lumen Power PICC was advanced to the lower SVC/right atrial junction. Fluoroscopy during the procedure and fluoro spot radiograph confirms appropriate catheter position. The catheter was flushed and covered with asterile dressing.  Complications: None.  IMPRESSION: Successful right arm Power PICC line placement with ultrasound and fluoroscopic guidance. The catheter is ready for use.   Electronically Signed   By: Maryclare Bean M.D.   On: 08/11/2013 12:40   Dg Chest Port 1 View  08/13/2013   CLINICAL DATA:  Evaluate respiratory failure  EXAM: PORTABLE CHEST - 1 VIEW  COMPARISON:  DG CHEST 1 VIEW dated 08/11/2013; DG CHEST 1V PORT dated 08/11/2013; DG CHEST 1V PORT dated 08/10/2013; CT CHEST W/O CM dated 08/10/2013  FINDINGS: Grossly unchanged cardiac silhouette and mediastinal contours. Interval removal of right upper extremity approach PICC line and placement of a right internal jugular approach is catheter with tip projecting over the superior aspect of the right atrium. The lung volumes remain reduced with grossly unchanged apical predominant heterogeneous airspace opacities and volume loss, left greater than right. No new focal airspace  opacities. Unchanged small left-sided effusion. No definite pneumothorax. Unchanged bones.  IMPRESSION: 1. Interval placement of right internal jugular approach central venous catheter with tip overlying the superior aspect of the right atrium. No pneumothorax. 2. Grossly unchanged biapical heterogeneous airspace opacities, left greater than right with associated volume loss, possibly atelectasis or scar though underlying infection not excluded. 3. Unchanged small left-sided effusion.   Electronically Signed   By: Sandi Mariscal M.D.   On: 08/13/2013 08:38    Scheduled Meds: . albuterol  2.5 mg Nebulization Q6H  . Chlorhexidine Gluconate Cloth  6 each Topical Q0600  . enoxaparin (LOVENOX) injection  40 mg Subcutaneous Q24H  . feeding supplement (ENSURE COMPLETE)  237 mL Oral BID BM  . furosemide  40 mg Oral Daily  . insulin aspart  0-9 Units Subcutaneous TID WC  . leflunomide  20 mg Oral Daily  . levofloxacin  750 mg Oral Daily  . mupirocin ointment  1 application Nasal BID  . pantoprazole  40 mg Oral Daily  . potassium chloride SA  60 mEq Oral Daily  . predniSONE  40 mg Oral Q breakfast  . sodium chloride  3 mL Intravenous Q12H  . tiotropium  18 mcg Inhalation Daily  . voriconazole (VFEND - PT WT > 85 kg) IV  4 mg/kg Intravenous Q12H   Continuous Infusions:   Principal Problem:   HCAP (healthcare-associated pneumonia) Active Problems:   Copd Gold C    Chronic diastolic heart failure, NYHA class 1   Diabetes mellitus, type II   Morbid obesity   Rheumatoid arthritis(714.0)   Hypertension   Acute on chronic respiratory failure with hypoxia   Pulmonary aspergillosis   Palliative care encounter   DNR (do not resuscitate)    Time spent: 25 min    Tenesha Garza  S  [facility] Pager [phone] If 7PM-7AM, please contact night-coverage at [email or web address], password [facility] 08/13/2013, 11:12 AM  LOS: 3 days

## 2013-08-13 NOTE — Progress Notes (Signed)
ANTIBIOTIC CONSULT NOTE - Follow up  Pharmacy Consult for Voriconazole Indication: Pulmonary Aspergillosis  Allergies  Allergen Reactions  . Chantix [Varenicline] Other (See Comments)    Unknown " blurry vision"  . Hctz [Hydrochlorothiazide] Other (See Comments)    unknown  . Penicillins Rash  . Sulfonamide Derivatives Rash  . Tetanus Toxoid Other (See Comments)    Knot on arm    Patient Measurements: Height: 5\' 4"  (162.6 cm) Weight: 214 lb 15.2 oz (97.5 kg) IBW/kg (Calculated) : 54.7  Vital Signs: Temp: 98.3 F (36.8 C) (02/28 0800) Temp src: Oral (02/28 0800) BP: 109/73 mmHg (02/28 1200) Pulse Rate: 97 (02/28 1200) Intake/Output from previous day: 02/27 0701 - 02/28 0700 In: 928.4 [I.V.:410; Blood:330.4; IV Piggyback:188] Out: 490 [Urine:490] Intake/Output from this shift: Total I/O In: 340 [P.O.:240; I.V.:100] Out: 600 [Urine:600]  Labs:  Recent Labs  08/11/13 0308 08/12/13 0315 08/13/13 0545  WBC 10.8* 17.2* 12.8*  HGB 7.1* 6.9* 8.0*  PLT 277 306 257  CREATININE 0.69 0.73 0.74   Estimated Creatinine Clearance: 82.6 ml/min (by C-G formula based on Cr of 0.74).  Anti-infectives: PTA minocycline 2/18 >> 2/26 2/25 >> aztreonam >> 2/26 2/25 >> vancomycin >> 2/26 2/26 >> levaquin (MD) >> 2/26 >> voriconazole >>  Microbiology 2/25 MRSA PCR (+) 2/25 blood x2: ngtd 2/25 Aspergillus galactomannan antigen: in process 2/26 Aspergillus antibody: in process 2/26 urine strep POSITIVE 2/26 urine legionella (-) Sputum: ordered Sputum (fungal): ordered  Assessment: 63 yo female known to pharmacy from vancomycin/aztreonam protocols. CCM added voriconazole during work-up of possible semi-invasive pulmonary Aspergillosis - galactomannan antigen: in process.  Tmax: now NORMOthermic WBCs: down to 12.8 Renal: Scr 0.7- stable, CrCl 81 CG and N PCT: 0.28 (2/27), 0.7 (2/25) Lactate: 3.24 (2/25)  Goal of Therapy:  Eradication of infection  Plan:   Continue  Voriconzole 4mg /kg (380mg )  IV q12h as maintenance dose based on actual body weight.   Plan transition to oral 200mg  BID pending clinical response, remains in SD/ICU  Continue levofloxacin 750mg  PO daily per MD  Pharmacy will f/u daily  Thank you for the consult.  05-13-1984, PharmD, BCPS Pager: [phone] Pharmacy: [phone] 08/13/2013 1:26 PM

## 2013-08-13 NOTE — Progress Notes (Signed)
Patient OOB to bedside commode.  HR 130s-170s when getting back to bed with 1 assist.  Patient with increased work of breathing, c/o "getting no air" and "very anxious".  Patient remained on 100% NRB until RR decreased, patient placed on 5L [place] with no complaints.  O2 sats 94%.  HR back to baseline.  Time to recover approx. 5 minutes.  Will continue to monitor.

## 2013-08-14 DIAGNOSIS — E039 Hypothyroidism, unspecified: Secondary | ICD-10-CM

## 2013-08-14 LAB — PROCALCITONIN: Procalcitonin: 0.14 ng/mL

## 2013-08-14 LAB — GLUCOSE, CAPILLARY
GLUCOSE-CAPILLARY: 128 mg/dL — AB (ref 70–99)
Glucose-Capillary: 84 mg/dL (ref 70–99)
Glucose-Capillary: 131 mg/dL — ABNORMAL HIGH (ref 70–99)
Glucose-Capillary: 115 mg/dL — ABNORMAL HIGH (ref 70–99)

## 2013-08-14 MED ORDER — MORPHINE SULFATE (CONCENTRATE) 10 MG /0.5 ML PO SOLN
5.0000 mg | ORAL | Status: DC | PRN
  Administered 2013-08-14: 5 mg via ORAL
  Filled 2013-08-14: qty 0.5

## 2013-08-14 MED ORDER — LEVALBUTEROL HCL 0.63 MG/3ML IN NEBU
0.6300 mg | INHALATION_SOLUTION | Freq: Four times a day (QID) | RESPIRATORY_TRACT | Status: DC
  Administered 2013-08-14 – 2013-08-16 (×9): 0.63 mg via RESPIRATORY_TRACT
  Filled 2013-08-14 (×15): qty 3

## 2013-08-14 NOTE — Progress Notes (Signed)
[facility] PROGRESS NOTE  Jacqueline Washington WNU:[id number] DOB: 08/16/1950 DOA: 08/10/2013 PCP: Alesia Richards, MD  Assessment/Plan: 1. Acute on chronic respiratory failure- multifactorial, on antibiotics  Has been changed to Levaquin  Fungal work up is pending, will start the antifungals if positive.started on Solu-Medrol 40 g IV every 12 hours per pulmonary, and now changed to prednisone 40 mg po daily. Will follow the pulmonary recommendations regarding discharge to home. 2. Pleural effusion- repeat chest x-ray did not show effusion. No thoracentesis at this time 3. Pulmonary fibrosis- She has h/o rheumatoid arthritis, continue with Prednisone 4. Rheumatoid arthritis- Continue with Arava. 5. Grade 1 diastolic dysfunction- she has h/o grade 1 diastolic dysfunction,continue with lasix 40 mg po daliy. BNP is elevated to 1099. 6. Cavitary pulmonary aspergillosis- continue voriconazole. 7. PICC line placement- IR unable to place picc line despite repeated attempts, so subclavian central line was placed.. 8. Diabetes mellitus- Continue with SSI, Blood glucose is well controlled. 9. DVT Prophylaxis- Lovenox 10. Goals of care discussion- Palliative care discussed with family and patient, at this time they would like to go home with home hospice. Discussed in detail with family regarding the hospice care. Other details will be discussed by the hospice nurse.  Code Status: DNR Family Communication: Discussed with daughter at bedside Disposition Plan: Home when stable   Consultants:  pulmonary  Procedures:  None  Antibiotics:  Azactam 2/25>>2/26  Levaquin 2/25>>  Vancomycin 2/25>>2/26  HPI/Subjective: Patient seen and examined, feels better today. Says her breathing has somewhat improved. Family would like to go home with hospice, but have questions for hospice.  Objective: Filed Vitals:   08/14/13 0800  BP: 148/83  Pulse:   Temp: 98.3 F (36.8 C)  Resp: 23     Intake/Output Summary (Last 24 hours) at 08/14/13 0917 Last data filed at 08/14/13 0800  Gross per 24 hour  Intake   1218 ml  Output   2600 ml  Net  -1382 ml   Filed Weights   08/10/13 2326 08/13/13 0500 08/14/13 0430  Weight: 95.2 kg (209 lb 14.1 oz) 97.5 kg (214 lb 15.2 oz) 96 kg (211 lb 10.3 oz)    Exam:       Physical Exam: Head: Normocephalic, atraumatic.  Eyes: No signs of jaundice, EOMI Nose: Mucous membranes dry.  Throat: Oropharynx nonerythematous, no exudate appreciated.  Neck: supple,No deformities, masses, or tenderness noted. Lungs: Bilateral rhonchi Heart: Regular RR. S1 and S2 normal  Abdomen: BS normoactive. Soft, Nondistended, non-tender.  Extremities: No pretibial edema, no erythema   Data Reviewed: Basic Metabolic Panel:  Recent Labs Lab 08/10/13 1554 08/10/13 2241 08/11/13 0308 08/12/13 0315 08/13/13 0545  NA 138  --  139 141 144  K 4.4  --  3.8 4.0 4.1  CL 94*  --  99 102 102  CO2 27  --  $R'27 27 31  'nw$ GLUCOSE 204*  --  164* 178* 151*  BUN 14  --  $R'12 21 21  'Yv$ CREATININE 0.82 0.82 0.69 0.73 0.74  CALCIUM 9.3  --  8.9 8.9 8.8   Liver Function Tests:  Recent Labs Lab 08/10/13 2241  PROT 7.1   No results found for this basename: LIPASE, AMYLASE,  in the last 168 hours No results found for this basename: AMMONIA,  in the last 168 hours CBC:  Recent Labs Lab 08/10/13 1554 08/11/13 0308 08/12/13 0315 08/13/13 0545  WBC 16.0* 10.8* 17.2* 12.8*  NEUTROABS 13.9* 10.2*  --   --   HGB  7.8* 7.1* 6.9* 8.0*  HCT 28.7* 25.1* 25.1* 27.9*  MCV 77.4* 76.8* 77.7* 80.2  PLT 303 277 306 257   Cardiac Enzymes:  Recent Labs Lab 08/10/13 1554  TROPONINI <0.30   BNP (last 3 results)  Recent Labs  03/25/13 0601 04/14/13 2300 08/10/13 1554  PROBNP 1150.0* 301.6* 1099.0*   CBG:  Recent Labs Lab 08/13/13 0731 08/13/13 1223 08/13/13 1554 08/13/13 2240 08/14/13 0739  GLUCAP 122* 162* 154* 112* 84    Recent Results (from the past  240 hour(s))  CULTURE, BLOOD (ROUTINE X 2)     Status: None   Collection Time    08/10/13  8:35 PM      Result Value Ref Range Status   Specimen Description BLOOD LEFT WRIST   Final   Special Requests BOTTLES DRAWN AEROBIC AND ANAEROBIC 2.5ML   Final   Culture  Setup Time     Final   Value: 08/11/2013 01:18     Performed at [facility]   Culture     Final   Value:        BLOOD CULTURE RECEIVED NO GROWTH TO DATE CULTURE WILL BE HELD FOR 5 DAYS BEFORE ISSUING A FINAL NEGATIVE REPORT     Performed at [facility]   Report Status PENDING   Incomplete  MRSA PCR SCREENING     Status: Abnormal   Collection Time    08/10/13 11:40 PM      Result Value Ref Range Status   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 It is not     intended to diagnose MRSA     infection nor to guide or     monitor treatment for     MRSA infections.     RESULT CALLED TO, READ BACK BY AND VERIFIED WITH:     SPOKE WITH AYERS,C RN [phone] [place],N  CULTURE, BLOOD (ROUTINE X 2)     Status: None   Collection Time    08/10/13 11:45 PM      Result Value Ref Range Status   Specimen Description BLOOD LEFT HAND   Final   Special Requests BOTTLES DRAWN AEROBIC AND ANAEROBIC 5CC   Final   Culture  Setup Time     Final   Value: 08/11/2013 04:06     Performed at [facility]   Culture     Final   Value:        BLOOD CULTURE RECEIVED NO GROWTH TO DATE CULTURE WILL BE HELD FOR 5 DAYS BEFORE ISSUING A FINAL NEGATIVE REPORT     Performed at [facility]   Report Status PENDING   Incomplete     Studies: Ir Fluoro Guide Cv Line Right  08/12/2013   INDICATION: Malfunctioning right upper extremity approach PICC line; in need of intravenous access for medication administration and blood draws.  EXAM: TUNNELED  PICC PLACEMENT WITH ULTRASOUND AND FLUOROSCOPIC GUIDANCE  MEDICATIONS: The patient is currently admitted to the hospital and receiving intravenous antibiotics; The IV antibiotic was given in an appropriate time interval prior to skin puncture.  ANESTHESIA/SEDATION: None  CONTRAST:  None  FLUOROSCOPY TIME:  12 seconds.  PROCEDURE: Informed written consent was  obtained from the patient after a discussion of the risks, benefits, and alternatives to treatment. Questions regarding the procedure were encouraged and answered. The right neck and chest were prepped with chlorhexidine in a sterile fashion, and a sterile drape was applied covering the operative field. Maximum barrier sterile technique with sterile gowns and gloves were used for the procedure. A timeout was performed prior to the initiation of the procedure.  After creating a small venotomy incision, a micropuncture kit was utilized to access the right internal jugular vein under direct, real-time ultrasound guidance after the overlying soft tissues were anesthetized with 1% lidocaine with epinephrine. Ultrasound image documentation was performed. The microwire was kinked to measure appropriate catheter length. A stiff Glidewire was advanced to the level of the IVC and the micropuncture sheath was exchanged for a peel-away sheath. A dual lumen power PICC measuring 24 cm from tip to cuff was tunneled in a retrograde fashion from the anterior chest wall to the venotomy incision.  The catheter was then placed through the peel-away sheath with tips ultimately positioned within the superior aspect of the right atrium. Final catheter positioning was confirmed and documented with a spot radiographic image. The catheter aspirates and flushes normally. The catheter was flushed with appropriate volume heparin dwells.  The catheter exit site was secured with a 0-Prolene retention suture. The venotomy incision was closed with Dermabond and Steri-strips. Dressings were  applied. The patient tolerated the procedure well without immediate post procedural complication.  The malfunctioning right upper extremity approach PICC line was then removed at the bedside.  COMPLICATIONS: None immediate  IMPRESSION: 1. Successful placement of 24 cm tip to cuff tunneled PICC via the right internal jugular vein with tip terminating within the superior aspect of the right atrium. The catheter is ready for immediate use. 2. Successful removal of right upper extremity approach PICC line.   Electronically Signed   By: Sandi Mariscal M.D.   On: 08/12/2013 18:23   Ir US Guide Vasc Access Right  08/12/2013   INDICATION: Malfunctioning right upper extremity approach PICC line; in need of intravenous access for medication administration and blood draws.  EXAM: TUNNELED PICC PLACEMENT WITH ULTRASOUND AND FLUOROSCOPIC GUIDANCE  MEDICATIONS: The patient is currently admitted to the hospital and receiving intravenous antibiotics; The IV antibiotic was given in an appropriate time interval prior to skin puncture.  ANESTHESIA/SEDATION: None  CONTRAST:  None  FLUOROSCOPY TIME:  12 seconds.  PROCEDURE: Informed written consent was obtained from the patient after a discussion of the risks, benefits, and alternatives to treatment. Questions regarding the procedure were encouraged and answered. The right neck and chest were prepped with chlorhexidine in a sterile fashion, and a sterile drape was applied covering the operative field. Maximum barrier sterile technique with sterile gowns and gloves were used for the procedure. A timeout was performed prior to the initiation of the procedure.  After creating a small venotomy incision, a micropuncture kit was utilized to access the right internal jugular vein under direct, real-time ultrasound guidance after the overlying soft tissues were anesthetized with 1% lidocaine with epinephrine. Ultrasound image documentation was performed. The microwire was kinked to measure  appropriate catheter length. A stiff Glidewire was advanced to the level of the IVC and the micropuncture sheath was exchanged for a peel-away sheath. A dual lumen power PICC measuring 24 cm from tip to cuff was tunneled in a retrograde fashion from the anterior chest wall to the venotomy incision.  The catheter was then placed  through the peel-away sheath with tips ultimately positioned within the superior aspect of the right atrium. Final catheter positioning was confirmed and documented with a spot radiographic image. The catheter aspirates and flushes normally. The catheter was flushed with appropriate volume heparin dwells.  The catheter exit site was secured with a 0-Prolene retention suture. The venotomy incision was closed with Dermabond and Steri-strips. Dressings were applied. The patient tolerated the procedure well without immediate post procedural complication.  The malfunctioning right upper extremity approach PICC line was then removed at the bedside.  COMPLICATIONS: None immediate  IMPRESSION: 1. Successful placement of 24 cm tip to cuff tunneled PICC via the right internal jugular vein with tip terminating within the superior aspect of the right atrium. The catheter is ready for immediate use. 2. Successful removal of right upper extremity approach PICC line.   Electronically Signed   By: Simonne Come M.D.   On: 08/12/2013 18:23   Dg Chest Port 1 View  08/13/2013   CLINICAL DATA:  Evaluate respiratory failure  EXAM: PORTABLE CHEST - 1 VIEW  COMPARISON:  DG CHEST 1 VIEW dated 08/11/2013; DG CHEST 1V PORT dated 08/11/2013; DG CHEST 1V PORT dated 08/10/2013; CT CHEST W/O CM dated 08/10/2013  FINDINGS: Grossly unchanged cardiac silhouette and mediastinal contours. Interval removal of right upper extremity approach PICC line and placement of a right internal jugular approach is catheter with tip projecting over the superior aspect of the right atrium. The lung volumes remain reduced with grossly unchanged  apical predominant heterogeneous airspace opacities and volume loss, left greater than right. No new focal airspace opacities. Unchanged small left-sided effusion. No definite pneumothorax. Unchanged bones.  IMPRESSION: 1. Interval placement of right internal jugular approach central venous catheter with tip overlying the superior aspect of the right atrium. No pneumothorax. 2. Grossly unchanged biapical heterogeneous airspace opacities, left greater than right with associated volume loss, possibly atelectasis or scar though underlying infection not excluded. 3. Unchanged small left-sided effusion.   Electronically Signed   By: Simonne Come M.D.   On: 08/13/2013 08:38    Scheduled Meds: . albuterol  2.5 mg Nebulization Q6H  . Chlorhexidine Gluconate Cloth  6 each Topical Q0600  . enoxaparin (LOVENOX) injection  40 mg Subcutaneous Q24H  . feeding supplement (ENSURE COMPLETE)  237 mL Oral BID BM  . furosemide  40 mg Oral Daily  . insulin aspart  0-9 Units Subcutaneous TID WC  . leflunomide  20 mg Oral Daily  . levofloxacin  750 mg Oral Daily  . mupirocin ointment  1 application Nasal BID  . pantoprazole  40 mg Oral Daily  . potassium chloride SA  60 mEq Oral Daily  . predniSONE  40 mg Oral Q breakfast  . sodium chloride  3 mL Intravenous Q12H  . tiotropium  18 mcg Inhalation Daily  . voriconazole (VFEND - PT WT > 85 kg) IV  4 mg/kg Intravenous Q12H   Continuous Infusions:   Principal Problem:   HCAP (healthcare-associated pneumonia) Active Problems:   Copd Gold C    Chronic diastolic heart failure, NYHA class 1   Diabetes mellitus, type II   Morbid obesity   Rheumatoid arthritis(714.0)   Hypertension   Acute on chronic respiratory failure with hypoxia   Pulmonary aspergillosis   Palliative care encounter   DNR (do not resuscitate)    Time spent: 25 min    [facility]  [facility] Pager [phone]*. If 7PM-7AM, please contact night-coverage at [email or web address], password  [facility]  08/14/2013, 9:17 AM  LOS: 4 days

## 2013-08-14 NOTE — Progress Notes (Signed)
Patient OOB to bedside commode with 1 assist.  Tolerated well.  Patient became tachycardic (HR170s) and hypoxic (O2 sats 80s) when standing and getting back in bed with 1 assist.  Patient on 100% NRB, labored breathing, tachypnea, accessory muscle use.  Patient clammy, gripping bed rails.  Patient c/o "not getting any air".  Slowly patient's HR and RR returning to normal, O2 sats 90s.  Transitioned back to nasal cannula 5L.  Patient HR remains in 120s, O2 sats 95, RR 27.  Time to recover approximately 15 minutes.  Spoke to patient about possibly not using BSC and using bedpan instead, patient refuses.  Spoke with patient and daughter about asking MD to add Morphine for comfort while in the hospital setting.  Patient and daughter agreeable.  MD notified, new orders obtained.  Will continue to monitor.

## 2013-08-15 DIAGNOSIS — Z515 Encounter for palliative care: Secondary | ICD-10-CM

## 2013-08-15 LAB — GLUCOSE, CAPILLARY
GLUCOSE-CAPILLARY: 75 mg/dL (ref 70–99)
GLUCOSE-CAPILLARY: 106 mg/dL — AB (ref 70–99)
GLUCOSE-CAPILLARY: 126 mg/dL — AB (ref 70–99)
Glucose-Capillary: 100 mg/dL — ABNORMAL HIGH (ref 70–99)
Glucose-Capillary: 123 mg/dL — ABNORMAL HIGH (ref 70–99)

## 2013-08-15 LAB — EXPECTORATED SPUTUM ASSESSMENT W GRAM STAIN, RFLX TO RESP C

## 2013-08-15 LAB — TYPE AND SCREEN
ABO/RH(D): O POS
ANTIBODY SCREEN: NEGATIVE
Unit division: 0

## 2013-08-15 LAB — EXPECTORATED SPUTUM ASSESSMENT W REFEX TO RESP CULTURE

## 2013-08-15 MED ORDER — LORAZEPAM 0.5 MG PO TABS
0.5000 mg | ORAL_TABLET | Freq: Once | ORAL | Status: AC
  Administered 2013-08-15: 0.5 mg via ORAL
  Filled 2013-08-15: qty 1

## 2013-08-15 MED ORDER — VORICONAZOLE 200 MG PO TABS
200.0000 mg | ORAL_TABLET | Freq: Two times a day (BID) | ORAL | Status: DC
  Administered 2013-08-15 – 2013-08-16 (×3): 200 mg via ORAL
  Filled 2013-08-15 (×4): qty 1

## 2013-08-15 NOTE — Progress Notes (Signed)
08/15/13 1600  Clinical Encounter Type  Visited With (attempted visit, but hospice RN visiting)   [facility] Care is aware of consult, but pt and family are visiting with hospice nurse at this time.  Attempted visits twice this afternoon.  Mena plans to f/u tomorrow, but please page as needs arise:  [phone].  Thank you.  [address] [place], [place] [phone]

## 2013-08-15 NOTE — Progress Notes (Signed)
Name: HARLEIGH CIVELLO MRN: [id number] DOB: Apr 03, 1951    ADMISSION DATE:  08/10/2013 CONSULTATION DATE:  2/25  REFERRING MD : Micheline Maze  PRIMARY SERVICE: [facility] Azzie Roup.   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 Presented to ER on 2/25 w/ essentially 4 weeks of progressive dyspnea w/ evolving pleuritic type CP on left. PCCM asked to assist w/ care.   SIGNIFICANT EVENTS / STUDIES:  CT chest 2/25: severe chronic lung disease with emphysema, pulmonary fibrosis, traction bronchiectasis and multiple lung cavities. There is an enlarging left-sided pleural effusion with overlying atelectasis. There is also new airspace opacity in the left upper lobe posteriorly Left upper lobe cavitary lesion concerning for fungal bal.   LINES / TUBES: R PICC 1/26 (non functioning)>>>  CULTURES: Fungal smear sputum 2/25>>> Aspergillus galactomannan antigen 2/25>>> 0.11 (neg) Aspergillus antibody 2/26 >>> Sputum 2/25>>> MRSA PCR 2/25 > POS Urine Strep 2/25 >>> POS BCx2 2/25 >>> Sputum 2/25 >>> Sputum fungal 2/25 >>>  ANTIBIOTICS: vanc 2/25>2/26 azactam 2/25>2/25 Levaquin 2/25 >>> voriconazole 2/26 >>>  INTERVAL HISTORY:  Feels about the same as yesterday. Palliative care visit today.   VITAL SIGNS: Temp:  [97.7 F (36.5 C)-98.3 F (36.8 C)] 97.7 F (36.5 C) (03/02 0800) Pulse Rate:  [103] 103 (03/01 2000) Resp:  [16-31] 31 (03/02 1000) BP: (119-139)/(65-87) 139/75 mmHg (03/02 0800) SpO2:  [93 %-98 %] 97 % (03/02 1000)  PHYSICAL EXAMINATION: General:  Chronically ill appearing 63 year old female, not currently in acute distress  Neuro:   Awake, alert, no focal def  HEENT:  [place], no JVD, mmm  Cardiovascular:  rrr Lungs:  Exp wz, crackles bilateral bases.  Abdomen:  Obese + bowel sounds  Musculoskeletal:  Intact  Skin:  LE edema    Recent Labs Lab 08/11/13 0308 08/12/13 0315 08/13/13 0545  HGB 7.1* 6.9* 8.0*  HCT 25.1* 25.1* 27.9*  WBC 10.8* 17.2* 12.8*  PLT 277 306 257    Recent Labs Lab 08/10/13 1554 08/10/13 2241 08/11/13 0308 08/12/13 0315 08/13/13 0545  NA 138  --  139 141 144  K 4.4  --  3.8 4.0 4.1  CL 94*  --  99 102 102  CO2 27  --  27 27 31   GLUCOSE 204*  --  164* 178* 151*  BUN 14  --  12 21 21   CREATININE 0.82 0.82 0.69 0.73 0.74  CALCIUM 9.3  --  8.9 8.9 8.8    Recent Labs Lab 08/10/13 1715  HCO3 29.7*  TCO2 28.1  O2SAT 66.0    Recent Labs Lab 08/10/13 2241  PROT 7.1    CXR:  2/28 >> R IJ CVC, B apical opacities L>R, small L effusion  ASSESSMENT: Acute on chronic respiratory failure (multifactorial) RA-related Pulmonary 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  PLAN/RECS: -supplemental O2 as needed to keep O2 sat > 90% -finish levaquin, total abx 7 days (d/c on 3/3) -Voriconazole, transition to PO 3/2 -will need to go to radiology for placement of another PICC -f/u aspergillus antibody  and other cultures  -Transitioned back to oral steroids 2/28. Goal is ultimately to get down to 20 mg/d or less. Taper can be managed outpatient by Dr Delford Field in Pulmonary -lasix as tolerated, trend BMP -Palliative care notes reviewed. The patient is interested in quality of life and home Hospice. She would be willing to take abx, antifungals, IVF, other simple and non-invasive interventions.   Long discussion with pt and her daughter with her nurse present for part of the conversation. Discussed goals of care,  palliative care and possible therapy for her aspergillus. Her priorities are to be home if at all possible, to insure comfort and dignity with meds if needed, to treat aspergillus if it will help her breathing and allow her to live longer without discomfort.  Issues we uncovered  that need to be addressed before d/c home >>  - will hospice cover voriconazole?  - which chronic meds can be d/c'd ?  - can she change from heart healthy diet to a regular diet?  - what equipment will she need at home > bed, oximizer and ability to deliver high flow O2.    Levy Pupa, MD, PhD 08/15/2013, 10:59 AM [facility] [phone] or if no answer [phone]

## 2013-08-15 NOTE — Progress Notes (Signed)
Notified by Kathleen Lime of pt/family interest in services of [facility] ([facility] at discharge. Spoke at length with pt and daughter Sam at bedside; pt and daughter informed they still have many questions for the doctors - pt stated she is not sure she is ready for hospice- she has been told she needs hospice to get her medicine; and but is not sure why this is or what the cost to her would be; she is not sure if there are any other medications that would be effective; she informed she had a HHRN,HHA, PT that worked with her prior to this hospitalization -she is aware that [facility] services would replace this and that ongoing PT services are not part of the hospice plan of care; pt voiced she is still trying to look at all her options  - when asked what they are considering when they talk about a more comfort approach to her care; Jacqueline Washington stated trying to reduce the number of medications she needs to take, not wanting to be resusitated or be on a ventilator - she does state that she wants to do everything she can to live, which could include returning to the hospital, IVF, Antibx etc; - Pt/dtr discussed multiple issues that still need to be addressed - pt now requires high O2 Litre flow  - per chart notes pt has needed to use NRBM prior to any transfer from bed to [facility] - with this preparation the pt is then able to use [facility] and return to bed - appropriate equipment will need to be coordinated and in the home prior to d/c. -Pt and daughter inform they do not want to agree to home with [facility] this afternoon and request this writer return tomorrow after they have had a chance to talk with other family and talk again with the doctors;  they were hopeful for Dr Delford Field to be attending working with [facility] should they move forward with the plan for home with hospice;they were hopeful for Dr Delford Field to also be able to weigh in on the plan of care- Writer will follow up with pt/daughter tomorrow as  requested.  Jacqueline Washington, [facility]  [phone]

## 2013-08-15 NOTE — Progress Notes (Signed)
[facility] PROGRESS NOTE  Jacqueline Washington LZJ:[id number] DOB: [birth year] DOA: 08/10/2013 PCP: Nadean Corwin, MD  Assessment/Plan: 1. Acute on chronic respiratory failure- multifactorial, on antibiotics  Has been changed to Levaquin . Continue with Prednisone 40 mg po daily.Started on Po Voriconazole per pulmonary. 2. Pleural effusion- repeat chest x-ray did not show effusion. No thoracentesis at this time 3. Pulmonary fibrosis- She has h/o rheumatoid arthritis, continue with Prednisone 4. Rheumatoid arthritis- Continue with Arava. 5. Grade 1 diastolic dysfunction- she has h/o grade 1 diastolic dysfunction,continue with lasix 40 mg po daliy. BNP is elevated to 1099. 6. Cavitary pulmonary aspergillosis- continue voriconazole. 7. PICC line placement- IR unable to place picc line despite repeated attempts, so subclavian central line was placed.. 8. Diabetes mellitus- Continue with SSI, Blood glucose is well controlled. 9. DVT Prophylaxis- Lovenox 10. Goals of care discussion- Palliative care discussed with family and patient, at this time they would like to go home with home hospice. Discussed in detail with family regarding the hospice care. Other details will be discussed by the hospice nurse.  Code Status: DNR Family Communication: Discussed with daughter at bedside Disposition Plan: Home when stable   Consultants:  pulmonary  Procedures:  None  Antibiotics:  Azactam 2/25>>2/26  Levaquin 2/25>>  Vancomycin 2/25>>2/26  HPI/Subjective: Patient seen and examined, feels better today. Says her breathing has somewhat improved. Family would like to go home with hospice, but have questions for hospice. Hospice nurse to follow .  Objective: Filed Vitals:   08/15/13 1200  BP:   Pulse:   Temp: 98.1 F (36.7 C)  Resp:     Intake/Output Summary (Last 24 hours) at 08/15/13 1329 Last data filed at 08/15/13 1100  Gross per 24 hour  Intake   1012 ml  Output   4805  ml  Net  -3793 ml   Filed Weights   08/10/13 2326 08/13/13 0500 08/14/13 0430  Weight: 95.2 kg (209 lb 14.1 oz) 97.5 kg (214 lb 15.2 oz) 96 kg (211 lb 10.3 oz)    Exam:       Physical Exam: Head: Normocephalic, atraumatic.  Eyes: No signs of jaundice, EOMI Nose: Mucous membranes dry.  Throat: Oropharynx nonerythematous, no exudate appreciated.  Neck: supple,No deformities, masses, or tenderness noted. Lungs: Bilateral rhonchi Heart: Regular RR. S1 and S2 normal  Abdomen: BS normoactive. Soft, Nondistended, non-tender.  Extremities: No pretibial edema, no erythema   Data Reviewed: Basic Metabolic Panel:  Recent Labs Lab 08/10/13 1554 08/10/13 2241 08/11/13 0308 08/12/13 0315 08/13/13 0545  NA 138  --  139 141 144  K 4.4  --  3.8 4.0 4.1  CL 94*  --  99 102 102  CO2 27  --  27 27 31   GLUCOSE 204*  --  164* 178* 151*  BUN 14  --  12 21 21   CREATININE 0.82 0.82 0.69 0.73 0.74  CALCIUM 9.3  --  8.9 8.9 8.8   Liver Function Tests:  Recent Labs Lab 08/10/13 2241  PROT 7.1   No results found for this basename: LIPASE, AMYLASE,  in the last 168 hours No results found for this basename: AMMONIA,  in the last 168 hours CBC:  Recent Labs Lab 08/10/13 1554 08/11/13 0308 08/12/13 0315 08/13/13 0545  WBC 16.0* 10.8* 17.2* 12.8*  NEUTROABS 13.9* 10.2*  --   --   HGB 7.8* 7.1* 6.9* 8.0*  HCT 28.7* 25.1* 25.1* 27.9*  MCV 77.4* 76.8* 77.7* 80.2  PLT 303 277 306 257  Cardiac Enzymes:  Recent Labs Lab 08/10/13 1554  TROPONINI <0.30   BNP (last 3 results)  Recent Labs  03/25/13 0601 04/14/13 2300 08/10/13 1554  PROBNP 1150.0* 301.6* 1099.0*   CBG:  Recent Labs Lab 08/14/13 1629 08/14/13 1942 08/14/13 2308 08/15/13 0731 08/15/13 1155  GLUCAP 131* 115* 100* 75 106*    Recent Results (from the past 240 hour(s))  CULTURE, BLOOD (ROUTINE X 2)     Status: None   Collection Time    08/10/13  8:35 PM      Result Value Ref Range Status   Specimen  Description BLOOD LEFT WRIST   Final   Special Requests BOTTLES DRAWN AEROBIC AND ANAEROBIC 2.5ML   Final   Culture  Setup Time     Final   Value: 08/11/2013 01:18     Performed at [facility]   Culture     Final   Value:        BLOOD CULTURE RECEIVED NO GROWTH TO DATE CULTURE WILL BE HELD FOR 5 DAYS BEFORE ISSUING A FINAL NEGATIVE REPORT     Performed at [facility]   Report Status PENDING   Incomplete  MRSA PCR SCREENING     Status: Abnormal   Collection Time    08/10/13 11:40 PM      Result Value Ref Range Status   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 It is not     intended to diagnose MRSA     infection nor to guide or     monitor treatment for     MRSA infections.     RESULT CALLED TO, READ BACK BY AND VERIFIED WITH:     SPOKE WITH AYERS,C RN [phone] [place],N  CULTURE, BLOOD (ROUTINE X 2)     Status: None   Collection Time    08/10/13 11:45 PM      Result Value Ref Range Status   Specimen Description BLOOD LEFT HAND   Final   Special Requests BOTTLES DRAWN AEROBIC AND ANAEROBIC 5CC   Final   Culture  Setup Time     Final   Value: 08/11/2013 04:06     Performed at [facility]   Culture     Final   Value:        BLOOD CULTURE RECEIVED NO GROWTH TO DATE CULTURE WILL BE HELD FOR 5 DAYS BEFORE ISSUING A FINAL NEGATIVE REPORT     Performed at [facility]   Report Status PENDING   Incomplete     Studies: No results found.  Scheduled Meds: . Chlorhexidine Gluconate Cloth  6 each Topical Q0600  . enoxaparin (LOVENOX) injection  40 mg Subcutaneous Q24H  . feeding supplement (ENSURE COMPLETE)  237 mL Oral BID BM  . furosemide  40 mg Oral Daily  . insulin aspart  0-9 Units Subcutaneous TID WC  . leflunomide  20 mg Oral Daily  . levalbuterol  0.63 mg  Nebulization Q6H  . levofloxacin  750 mg Oral Daily  . pantoprazole  40 mg Oral Daily  . potassium chloride SA  60 mEq Oral Daily  . predniSONE  40 mg Oral Q breakfast  . sodium chloride  3 mL Intravenous Q12H  . tiotropium  18 mcg Inhalation Daily  . voriconazole  200 mg Oral Q12H   Continuous Infusions:   Principal Problem:   HCAP (healthcare-associated pneumonia) Active Problems:   Copd Gold C    Chronic diastolic heart failure, NYHA class 1   Diabetes mellitus, type II   Morbid obesity   Rheumatoid arthritis(714.0)   Hypertension   Acute on chronic respiratory failure with hypoxia   Pulmonary aspergillosis   Palliative care encounter   DNR (do not resuscitate)    Time spent: 25 min    [facility]  [facility] Pager [phone]*. If 7PM-7AM, please contact night-coverage at [email or web address], password [email or web address] 08/15/2013, 1:29 PM  LOS: 5 days

## 2013-08-16 DIAGNOSIS — J449 Chronic obstructive pulmonary disease, unspecified: Secondary | ICD-10-CM

## 2013-08-16 LAB — ASPERGILLUS ANTIBODY (COMPLEMENT FIX): Aspergillus Antibody by Complement Fix: <1:8 {titer}

## 2013-08-16 LAB — GLUCOSE, CAPILLARY
GLUCOSE-CAPILLARY: 71 mg/dL (ref 70–99)
Glucose-Capillary: 157 mg/dL — ABNORMAL HIGH (ref 70–99)
Glucose-Capillary: 115 mg/dL — ABNORMAL HIGH (ref 70–99)

## 2013-08-16 MED ORDER — ALPRAZOLAM 0.5 MG PO TABS: 0.5000 mg | ORAL_TABLET | Freq: Three times a day (TID) | ORAL | Status: DC | PRN

## 2013-08-16 MED ORDER — LORAZEPAM 1 MG PO TABS
2.0000 mg | ORAL_TABLET | Freq: Once | ORAL | Status: AC
  Administered 2013-08-16: 2 mg via ORAL
  Filled 2013-08-16: qty 2

## 2013-08-16 MED ORDER — MORPHINE SULFATE 10 MG/5ML PO SOLN: 5.0000 mg | Freq: Four times a day (QID) | ORAL | Status: DC | PRN

## 2013-08-16 MED ORDER — VORICONAZOLE 200 MG PO TABS: 200.0000 mg | ORAL_TABLET | Freq: Two times a day (BID) | ORAL | Status: DC

## 2013-08-16 MED ORDER — PREDNISONE 20 MG PO TABS: ORAL_TABLET | ORAL | Status: DC

## 2013-08-16 MED ORDER — ENSURE COMPLETE PO LIQD: 237.0000 mL | Freq: Two times a day (BID) | ORAL | Status: DC

## 2013-08-16 NOTE — Discharge Summary (Signed)
Physician Discharge Summary  Jacqueline Washington OIB:[id number] DOB: [birth year] DOA: 08/10/2013  PCP: Alesia Richards, MD  Admit date: 08/10/2013 Discharge date: 08/16/2013  Time spent: *55 minutes  Recommendations for Outpatient Follow-up:  1. Follow up PCP in 2 weeks 2. Follow up Dr Joya Gaskins in one week  Discharge Diagnoses:  Principal Problem:   HCAP (healthcare-associated pneumonia) Active Problems:   Copd Gold C    Chronic diastolic heart failure, NYHA class 1   Diabetes mellitus, type II   Morbid obesity   Rheumatoid arthritis(714.0)   Hypertension   Acute on chronic respiratory failure with hypoxia   Pulmonary aspergillosis   Palliative care encounter   DNR (do not resuscitate)   Discharge Condition: *Stable  Diet recommendation: Low salt diet  Filed Weights   08/10/13 2326 08/13/13 0500 08/14/13 0430  Weight: 95.2 kg (209 lb 14.1 oz) 97.5 kg (214 lb 15.2 oz) 96 kg (211 lb 10.3 oz)    History of present illness:  63 yo female h/o RA, pulm fibrosis chronically on 3 L o2 at home cont, diastolic chf, dm, obesity comes in with several weeks of worsening sob particularly over the last several days. No fevers. Coughing a lot. She has been on a slow predisone taper with her pulmonologist and over a week ago went down from $RemoveB'40mg'HERjEFuQ$  pred a day to $Remo'35mg'KVEHV$  a day. She has chronic le edema which is no worse or better than normal. No cp. No hemoptysis. No n/v/d. Has not been on abx recently in at least a month. Feeling some better since arrival to ED after iv solumedrol and multiple breathing treatments.   Hospital Course:  1. Acute on chronic respiratory failure- multifactorial, improved, patient will require oxygen at home will also need home health aide and nursing. Was started on IV antibiotics, antibiotics were changed to Levaquin per primary,  She has completed the course of Levaquin in the hospital and will discontinue Levaquin as per pulmonary recommendation. Continue with Prednisone  taper, will taper to 35 mg by mouth daily for 7 days then 30 mg by mouth daily for 7 days then 25 mg by mouth daily for 7 days then 20 mg by mouth daily and stay on 20 mg by mouth daily. Will followup with pulmonary Dr. Joya Gaskins.  2. Pleural effusion- repeat chest x-ray did not show effusion. No thoracentesis at this time  3. Pulmonary fibrosis- She has h/o rheumatoid arthritis, continue with Prednisone  4. Rheumatoid arthritis- Continue with Arava.  5. Grade 1 diastolic dysfunction- she has h/o grade 1 diastolic dysfunction,continue with lasix 40 mg po daliy. BNP is elevated to 1099.  6. 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 Pulmonary recommends to continue with  Voriconazole.  7. PICC line placement- IR unable to place picc line despite repeated attempts, so subclavian central line was placed.. will discontinue central line before discharge    Goals of care discussion- Palliative care was called for hospice discussion as per pulmonary recommendation, after long discussion with palliative care and pulmonary at this time family feels that they are not ready for hospice. They want to pursue the aggressive treatments and would like to see how she responds in next few months. They are fully aware of hospice services, and would call for hospice when the field that patient is ready for comfort measures. They also would like to discuss with Dr. Joya Gaskins as outpatient has been following the patient for long time.     Procedures:  PICC line  Subclavian central line  CULTURES:  Fungal smear sputum 2/25>>>  Aspergillus galactomannan antigen 2/25>>> 0.11 (neg)  Aspergillus antibody 2/26 >>>  Sputum 2/25>>>  MRSA PCR 2/25 > POS  Urine Strep 2/25 >>> POS  BCx2 2/25 >>>  Sputum 2/25 >>>  Sputum fungal 2/25 >>>  CT chest 2/25: severe chronic lung disease with emphysema, pulmonary fibrosis, traction bronchiectasis  and multiple lung cavities. There is an enlarging left-sided pleural effusion with overlying atelectasis. There is also new airspace opacity in the left upper lobe posteriorly Left upper lobe cavitary lesion concerning for fungal bal.   Consultations:  Pulmonary  Discharge Exam: Filed Vitals:   08/16/13 0800  BP:   Pulse:   Temp: 98 F (36.7 C)  Resp:     General: Appear in no acute distress Cardiovascular: S1s2 RRR Respiratory: Clear bilaterally  Discharge Instructions  Discharge Orders   Future Appointments Provider Department Dept Phone   08/22/2013 10:30 AM Vicie Mutters, PA-C [facility] [phone]   09/02/2013 11:00 AM Elsie Stain, MD [facility] [phone]   09/27/2013 9:00 AM Milus Banister, MD [facility] [phone]   10/31/2013 11:00 AM Ardis Hughs, PA-C [facility] [phone]   03/31/2014 9:30 AM Vicie Mutters, PA-C  [facility] [phone]   Future Orders Complete By Expires   Diet - low sodium heart healthy  As directed    Increase activity slowly  As directed        Medication List    STOP taking these medications       HYDROcodone-acetaminophen 5-325 MG per tablet  Commonly known as:  NORCO/VICODIN     minocycline 100 MG capsule  Commonly known as:  MINOCIN,DYNACIN      TAKE these medications       albuterol (2.5 MG/3ML) 0.083% nebulizer solution  Commonly known as:  PROVENTIL  Take 3 mLs (2.5 mg total) by nebulization 4 (four) times daily. DX 496     allopurinol 100 MG tablet  Commonly known as:  ZYLOPRIM  Take 100 mg by mouth daily.     ALPRAZolam 0.5 MG tablet  Commonly known as:  XANAX  Take 1 tablet (0.5 mg total) by mouth 3 (three) times daily as needed for anxiety.     cetirizine 10 MG tablet  Commonly known as:  ZYRTEC  Take 10 mg by mouth daily.     feeding supplement (ENSURE  COMPLETE) Liqd  Take 237 mLs by mouth 2 (two) times daily between meals.     Fluticasone-Salmeterol 500-50 MCG/DOSE Aepb  Commonly known as:  ADVAIR  Inhale 1 puff into the lungs 2 (two) times daily.     furosemide 40 MG tablet  Commonly known as:  LASIX  Take 40 mg by mouth daily.     ipratropium 0.02 % nebulizer solution  Commonly known as:  ATROVENT  Take 2.5 mLs (0.5 mg total) by nebulization every 6 (six) hours as needed for wheezing or shortness of breath. Dx 496     leflunomide 20 MG tablet  Commonly known as:  ARAVA  Take 20 mg by mouth daily.     levothyroxine 50 MCG tablet  Commonly known as:  SYNTHROID, LEVOTHROID  TAKE 1 TABLET EVERY DAY     montelukast 10 MG tablet  Commonly known as:  SINGULAIR  Take 1 tablet by mouth daily.     morphine 10 MG/5ML solution  Take 2.5 mLs (5  mg total) by mouth every 6 (six) hours as needed for severe pain (Shortness of breath).     nystatin cream  Commonly known as:  MYCOSTATIN  Apply 1 application topically daily as needed (under breasts).     pantoprazole 40 MG tablet  Commonly known as:  PROTONIX  Take 1 tablet (40 mg total) by mouth daily.     potassium chloride SA 20 MEQ tablet  Commonly known as:  K-DUR,KLOR-CON  Take 60 mEq by mouth daily.     predniSONE 20 MG tablet  Commonly known as:  DELTASONE  Reduce to 35 mg daily for 7days then reduce to $RemoveB'30mg'LeeioDqV$  per day  for seven days, then reduce to 25 mg po daily for seven days and then reduce to 20 mg po daily and stay on 20 mg po daily (use $RemoveBe'20mg'mbKcBLRAB$  and $Re'5mg'HwU$  pred tabs)     tiotropium 18 MCG inhalation capsule  Commonly known as:  SPIRIVA HANDIHALER  Place 1 capsule (18 mcg total) into inhaler and inhale daily.     voriconazole 200 MG tablet  Commonly known as:  VFEND  Take 1 tablet (200 mg total) by mouth every 12 (twelve) hours.       Allergies  Allergen Reactions  . Chantix [Varenicline] Other (See Comments)    Unknown " blurry vision"  . Hctz [Hydrochlorothiazide]  Other (See Comments)    unknown  . Penicillins Rash  . Sulfonamide Derivatives Rash  . Tetanus Toxoid Other (See Comments)    Knot on arm       Follow-up Information   Follow up with Asencion Noble, MD. Schedule an appointment as soon as possible for a visit in 1 week.   Specialty:  Pulmonary Disease   Contact information:   [address] [address] [phone]       Follow up with Alesia Richards, MD In 2 weeks.   Specialty:  Internal Medicine   Contact information:   [address] [place] [address] [phone]        The results of significant diagnostics from this hospitalization (including imaging, microbiology, ancillary and laboratory) are listed below for reference.    Significant Diagnostic Studies: Dg Chest 1 View  08/11/2013   CLINICAL DATA:  63 year old female with PICC line placement.  EXAM: CHEST - 1 VIEW  COMPARISON:  08/11/2013 chest radiograph  FINDINGS: A right PICC line is present with tip overlying the mid -upper SVC.  Chronic Lung disease with scarring/ fibrosis and interstitial prominence again noted.  There is no evidence of pneumothorax.  No other significant changes are present.  IMPRESSION: Right PICC line with tip overlying the mid-upper SVC.  Chronic pulmonary changes.   Electronically Signed   By: Hassan Rowan M.D.   On: 08/11/2013 19:24   Dg Chest 2 View  08/03/2013   CLINICAL DATA:  Shortness of breath. History of pneumonia last year. Pain in the left chest.  EXAM: CHEST  2 VIEW  COMPARISON:  05/30/2013  FINDINGS: Generally stable appearance of the chest, with coarse biapical interstitial opacities and architectural distortion. The hila are uplifted. No evidence of cardiomegaly. Left pleural or extrapleural thickening, possibly effusion, also noted 04/03/2013.  IMPRESSION: 1. Biapical fibrosis, stable from 05/30/2013. No evidence of acute superimposed disease. 2. The left upper lung opacity has progressed over  the past year, and non emergent chest CT imaging is recommended.   Electronically Signed   By: Jorje Guild M.D.   On: 08/03/2013 11:35  Ct Chest Wo Contrast  08/10/2013   CLINICAL DATA:  Left-sided chest pain.  Respiratory distress.  EXAM: CT CHEST WITHOUT CONTRAST  TECHNIQUE: Multidetector CT imaging of the chest was performed following the standard protocol without IV contrast.  COMPARISON:  03/24/2013  FINDINGS: The chest wall is unremarkable. No breast masses, supraclavicular or axillary lymphadenopathy. The bony thorax is intact. No destructive bone lesions or spinal canal compromise. Moderate degenerative changes noted in the thoracic spine and mild-to-moderate osteoporosis.  The heart is normal in size. No pericardial effusion. No mediastinal or hilar mass or adenopathy. Small scattered lymph nodes are stable. The aorta demonstrates scattered atherosclerotic calcifications. The esophagus is grossly normal.  Examination of the lung parenchyma demonstrates severe chronic lung disease with emphysema, pulmonary fibrosis, traction bronchiectasis and multiple lung cavities. There is an enlarging left-sided pleural effusion with overlying atelectasis. There is also new airspace opacity in the left upper lobe posteriorly which could be a superimposed pneumonia.  The upper abdomen is unremarkable.  IMPRESSION: Severe chronic lung disease as discussed above with suspected superimposed left upper lobe pneumonia.   Electronically Signed   By: Kalman Jewels M.D.   On: 08/10/2013 17:50   Ir Fluoro Guide Cv Line Right  08/12/2013   INDICATION: Malfunctioning right upper extremity approach PICC line; in need of intravenous access for medication administration and blood draws.  EXAM: TUNNELED PICC PLACEMENT WITH ULTRASOUND AND FLUOROSCOPIC GUIDANCE  MEDICATIONS: The patient is currently admitted to the hospital and receiving intravenous antibiotics; The IV antibiotic was given in an appropriate time interval  prior to skin puncture.  ANESTHESIA/SEDATION: None  CONTRAST:  None  FLUOROSCOPY TIME:  12 seconds.  PROCEDURE: Informed written consent was obtained from the patient after a discussion of the risks, benefits, and alternatives to treatment. Questions regarding the procedure were encouraged and answered. The right neck and chest were prepped with chlorhexidine in a sterile fashion, and a sterile drape was applied covering the operative field. Maximum barrier sterile technique with sterile gowns and gloves were used for the procedure. A timeout was performed prior to the initiation of the procedure.  After creating a small venotomy incision, a micropuncture kit was utilized to access the right internal jugular vein under direct, real-time ultrasound guidance after the overlying soft tissues were anesthetized with 1% lidocaine with epinephrine. Ultrasound image documentation was performed. The microwire was kinked to measure appropriate catheter length. A stiff Glidewire was advanced to the level of the IVC and the micropuncture sheath was exchanged for a peel-away sheath. A dual lumen power PICC measuring 24 cm from tip to cuff was tunneled in a retrograde fashion from the anterior chest wall to the venotomy incision.  The catheter was then placed through the peel-away sheath with tips ultimately positioned within the superior aspect of the right atrium. Final catheter positioning was confirmed and documented with a spot radiographic image. The catheter aspirates and flushes normally. The catheter was flushed with appropriate volume heparin dwells.  The catheter exit site was secured with a 0-Prolene retention suture. The venotomy incision was closed with Dermabond and Steri-strips. Dressings were applied. The patient tolerated the procedure well without immediate post procedural complication.  The malfunctioning right upper extremity approach PICC line was then removed at the bedside.  COMPLICATIONS: None immediate   IMPRESSION: 1. Successful placement of 24 cm tip to cuff tunneled PICC via the right internal jugular vein with tip terminating within the superior aspect of the right atrium. The catheter is ready for  immediate use. 2. Successful removal of right upper extremity approach PICC line.   Electronically Signed   By: Sandi Mariscal M.D.   On: 08/12/2013 18:23   Ir Fluoro Guide Cv Line Right  08/11/2013   CLINICAL DATA:  Rheumatoid arthritis  EXAM: Right upper extremity PICC LINE PLACEMENT WITH ULTRASOUND AND FLUOROSCOPIC GUIDANCE  FLUOROSCOPY TIME:  24 seconds.  PROCEDURE: The patient was advised of the possible risks andcomplications and agreed to undergo the procedure. The patient was then brought to the angiographic suite for the procedure.  The right arm was prepped with chlorhexidine, drapedin the usual sterile fashion using maximum barrier technique (cap and mask, sterile gown, sterile gloves, large sterile sheet, hand hygiene and cutaneous antisepsis) and infiltrated locally with 1% Lidocaine.  Ultrasound demonstrated patency of the right basilic vein, and this was documented with an image. Under real-time ultrasound guidance, this vein was accessed with a 21 gauge micropuncture needle and image documentation was performed. A 0.018 wire was introduced in to the vein. Over this, a 5 [place] double lumen Power PICC was advanced to the lower SVC/right atrial junction. Fluoroscopy during the procedure and fluoro spot radiograph confirms appropriate catheter position. The catheter was flushed and covered with asterile dressing.  Complications: None.  IMPRESSION: Successful right arm Power PICC line placement with ultrasound and fluoroscopic guidance. The catheter is ready for use.   Electronically Signed   By: Maryclare Bean M.D.   On: 08/11/2013 12:40   Ir Chest Fluoro  08/12/2013   CLINICAL DATA:  PICC occluded  EXAM: CHEST FLUOROSCOPY  PROCEDURE: The right PICC entry site was prepped and draped in a sterile fashion.  Fluoroscopic examination over the right arm demonstrates that the PICC is redundant and kinked in the subcutaneous fat. The PICC was retracted. A wire was inserted. The PICC was then advanced to the cavoatrial junction. It was flushed then secured in place.  FINDINGS: Tip of the PICC is now at the cavoatrial junction.  IMPRESSION: Successful manipulation of the right upper extremity PICC under sterile technique. The tip is now at the cavoatrial junction and it is functioning normally.   Electronically Signed   By: Maryclare Bean M.D.   On: 08/12/2013 08:39   Ir US Guide Vasc Access Right  08/12/2013   INDICATION: Malfunctioning right upper extremity approach PICC line; in need of intravenous access for medication administration and blood draws.  EXAM: TUNNELED PICC PLACEMENT WITH ULTRASOUND AND FLUOROSCOPIC GUIDANCE  MEDICATIONS: The patient is currently admitted to the hospital and receiving intravenous antibiotics; The IV antibiotic was given in an appropriate time interval prior to skin puncture.  ANESTHESIA/SEDATION: None  CONTRAST:  None  FLUOROSCOPY TIME:  12 seconds.  PROCEDURE: Informed written consent was obtained from the patient after a discussion of the risks, benefits, and alternatives to treatment. Questions regarding the procedure were encouraged and answered. The right neck and chest were prepped with chlorhexidine in a sterile fashion, and a sterile drape was applied covering the operative field. Maximum barrier sterile technique with sterile gowns and gloves were used for the procedure. A timeout was performed prior to the initiation of the procedure.  After creating a small venotomy incision, a micropuncture kit was utilized to access the right internal jugular vein under direct, real-time ultrasound guidance after the overlying soft tissues were anesthetized with 1% lidocaine with epinephrine. Ultrasound image documentation was performed. The microwire was kinked to measure appropriate catheter  length. A stiff Glidewire was advanced to the level  of the IVC and the micropuncture sheath was exchanged for a peel-away sheath. A dual lumen power PICC measuring 24 cm from tip to cuff was tunneled in a retrograde fashion from the anterior chest wall to the venotomy incision.  The catheter was then placed through the peel-away sheath with tips ultimately positioned within the superior aspect of the right atrium. Final catheter positioning was confirmed and documented with a spot radiographic image. The catheter aspirates and flushes normally. The catheter was flushed with appropriate volume heparin dwells.  The catheter exit site was secured with a 0-Prolene retention suture. The venotomy incision was closed with Dermabond and Steri-strips. Dressings were applied. The patient tolerated the procedure well without immediate post procedural complication.  The malfunctioning right upper extremity approach PICC line was then removed at the bedside.  COMPLICATIONS: None immediate  IMPRESSION: 1. Successful placement of 24 cm tip to cuff tunneled PICC via the right internal jugular vein with tip terminating within the superior aspect of the right atrium. The catheter is ready for immediate use. 2. Successful removal of right upper extremity approach PICC line.   Electronically Signed   By: Sandi Mariscal M.D.   On: 08/12/2013 18:23   Ir US Guide Vasc Access Right  08/11/2013   CLINICAL DATA:  Rheumatoid arthritis  EXAM: Right upper extremity PICC LINE PLACEMENT WITH ULTRASOUND AND FLUOROSCOPIC GUIDANCE  FLUOROSCOPY TIME:  24 seconds.  PROCEDURE: The patient was advised of the possible risks andcomplications and agreed to undergo the procedure. The patient was then brought to the angiographic suite for the procedure.  The right arm was prepped with chlorhexidine, drapedin the usual sterile fashion using maximum barrier technique (cap and mask, sterile gown, sterile gloves, large sterile sheet, hand hygiene and cutaneous  antisepsis) and infiltrated locally with 1% Lidocaine.  Ultrasound demonstrated patency of the right basilic vein, and this was documented with an image. Under real-time ultrasound guidance, this vein was accessed with a 21 gauge micropuncture needle and image documentation was performed. A 0.018 wire was introduced in to the vein. Over this, a 5 [place] double lumen Power PICC was advanced to the lower SVC/right atrial junction. Fluoroscopy during the procedure and fluoro spot radiograph confirms appropriate catheter position. The catheter was flushed and covered with asterile dressing.  Complications: None.  IMPRESSION: Successful right arm Power PICC line placement with ultrasound and fluoroscopic guidance. The catheter is ready for use.   Electronically Signed   By: Maryclare Bean M.D.   On: 08/11/2013 12:40   Dg Chest Port 1 View  08/13/2013   CLINICAL DATA:  Evaluate respiratory failure  EXAM: PORTABLE CHEST - 1 VIEW  COMPARISON:  DG CHEST 1 VIEW dated 08/11/2013; DG CHEST 1V PORT dated 08/11/2013; DG CHEST 1V PORT dated 08/10/2013; CT CHEST W/O CM dated 08/10/2013  FINDINGS: Grossly unchanged cardiac silhouette and mediastinal contours. Interval removal of right upper extremity approach PICC line and placement of a right internal jugular approach is catheter with tip projecting over the superior aspect of the right atrium. The lung volumes remain reduced with grossly unchanged apical predominant heterogeneous airspace opacities and volume loss, left greater than right. No new focal airspace opacities. Unchanged small left-sided effusion. No definite pneumothorax. Unchanged bones.  IMPRESSION: 1. Interval placement of right internal jugular approach central venous catheter with tip overlying the superior aspect of the right atrium. No pneumothorax. 2. Grossly unchanged biapical heterogeneous airspace opacities, left greater than right with associated volume loss, possibly atelectasis or scar though  underlying  infection not excluded. 3. Unchanged small left-sided effusion.   Electronically Signed   By: Sandi Mariscal M.D.   On: 08/13/2013 08:38   Dg Chest Port 1 View  08/11/2013   CLINICAL DATA:  Cough.  EXAM: PORTABLE CHEST - 1 VIEW  COMPARISON:  CT CHEST W/O CM dated 08/10/2013; DG CHEST 1V PORT dated 08/10/2013; DG CHEST 2 VIEW dated 08/03/2013; DG CHEST 1V PORT dated 03/23/2013  FINDINGS: Mediastinum is stable. Severe biapical pleural parenchymal thickening and interstitial prominence noted with retraction of the hilum consistent with scarring. Pulmonary density in the left upper lobe although possibly related to scarring has progressed from prior year's exam. Chest CT suggested for further evaluation. Heart size normal. Changes of pleural scarring left base . No pneumothorax. No acute osseous abnormality.  IMPRESSION: 1. Biapical severe pleural parenchymal scarring and interstitial fibrosis. 2. Left upper lung pulmonary density has progressed in size over prior year. Chest CT is suggested for further evaluation   Electronically Signed   By: Marcello Moores  Register   On: 08/11/2013 09:42   Dg Chest Port 1 View  08/10/2013   CLINICAL DATA:  resp distress  EXAM: PORTABLE CHEST - 1 VIEW  COMPARISON:  DG CHEST 2 VIEW dated 08/03/2013; DG CHEST 2 VIEW dated 10/23/2012; DG CHEST 1V PORT dated 03/22/2013  FINDINGS: The interstitial findings within the right and left lung apices are stable when compared to the previous study and when correlated with prior imaging. Hilar retraction is once again appreciated. No new focal regions of consolidation or new focal infiltrates appreciated. Cardiac silhouette is within normal limits. There is blunting of the left costophrenic angle also stable there is prominence of the interstitial markings within the remaining aerated portions of the lungs which appears stable. No new focal regions of consolidation or new focal infiltrates.  IMPRESSION: Stable chronic changes within the right and left lung  apices as described above and left lung base.   Electronically Signed   By: Margaree Mackintosh M.D.   On: 08/10/2013 16:32    Microbiology: Recent Results (from the past 240 hour(s))  CULTURE, BLOOD (ROUTINE X 2)     Status: None   Collection Time    08/10/13  8:35 PM      Result Value Ref Range Status   Specimen Description BLOOD LEFT WRIST   Final   Special Requests BOTTLES DRAWN AEROBIC AND ANAEROBIC 2.5ML   Final   Culture  Setup Time     Final   Value: 08/11/2013 01:18     Performed at [facility]   Culture     Final   Value:        BLOOD CULTURE RECEIVED NO GROWTH TO DATE CULTURE WILL BE HELD FOR 5 DAYS BEFORE ISSUING A FINAL NEGATIVE REPORT     Performed at [facility]   Report Status PENDING   Incomplete  MRSA PCR SCREENING     Status: Abnormal   Collection Time    08/10/13 11:40 PM      Result Value Ref Range Status   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 It is not     intended to diagnose MRSA  infection nor to guide or     monitor treatment for     MRSA infections.     RESULT CALLED TO, READ BACK BY AND VERIFIED WITH:     SPOKE WITH AYERS,C RN [phone] [place],N  CULTURE, BLOOD (ROUTINE X 2)     Status: None   Collection Time    08/10/13 11:45 PM      Result Value Ref Range Status   Specimen Description BLOOD LEFT HAND   Final   Special Requests BOTTLES DRAWN AEROBIC AND ANAEROBIC 5CC   Final   Culture  Setup Time     Final   Value: 08/11/2013 04:06     Performed at [facility]   Culture     Final   Value:        BLOOD CULTURE RECEIVED NO GROWTH TO DATE CULTURE WILL BE HELD FOR 5 DAYS BEFORE ISSUING A FINAL NEGATIVE REPORT     Performed at [facility]   Report Status PENDING   Incomplete  CULTURE, EXPECTORATED SPUTUM-ASSESSMENT     Status:  None   Collection Time    08/15/13  6:00 PM      Result Value Ref Range Status   Specimen Description SPUTUM   Final   Special Requests NONE   Final   Sputum evaluation     Final   Value: THIS SPECIMEN IS ACCEPTABLE. RESPIRATORY CULTURE REPORT TO FOLLOW.   Report Status 08/15/2013 FINAL   Final  CULTURE, RESPIRATORY (NON-EXPECTORATED)     Status: None   Collection Time    08/15/13  6:00 PM      Result Value Ref Range Status   Specimen Description SPUTUM   Final   Special Requests NONE   Final   Gram Stain     Final   Value: FEW WBC PRESENT,BOTH PMN AND MONONUCLEAR     RARE SQUAMOUS EPITHELIAL CELLS PRESENT     FEW GRAM POSITIVE COCCI IN PAIRS     FEW GRAM NEGATIVE RODS     Performed at [facility]   Culture PENDING   Incomplete   Report Status PENDING   Incomplete     Labs: Basic Metabolic Panel:  Recent Labs Lab 08/10/13 1554 08/10/13 2241 08/11/13 0308 08/12/13 0315 08/13/13 0545  NA 138  --  139 141 144  K 4.4  --  3.8 4.0 4.1  CL 94*  --  99 102 102  CO2 27  --  $R'27 27 31  'rO$ GLUCOSE 204*  --  164* 178* 151*  BUN 14  --  $R'12 21 21  'CY$ CREATININE 0.82 0.82 0.69 0.73 0.74  CALCIUM 9.3  --  8.9 8.9 8.8   Liver Function Tests:  Recent Labs Lab 08/10/13 2241  PROT 7.1   No results found for this basename: LIPASE, AMYLASE,  in the last 168 hours No results found for this basename: AMMONIA,  in the last 168 hours CBC:  Recent Labs Lab 08/10/13 1554 08/11/13 0308 08/12/13 0315 08/13/13 0545  WBC 16.0* 10.8* 17.2* 12.8*  NEUTROABS 13.9* 10.2*  --   --   HGB 7.8* 7.1* 6.9* 8.0*  HCT 28.7* 25.1* 25.1* 27.9*  MCV 77.4* 76.8* 77.7* 80.2  PLT 303 277 306 257   Cardiac Enzymes:  Recent Labs Lab 08/10/13 1554  TROPONINI <0.30   BNP: BNP (last 3 results)  Recent Labs  03/25/13 0601 04/14/13 2300 08/10/13 1554  PROBNP 1150.0* 301.6* 1099.0*   CBG:  Recent Labs  Lab 08/15/13 0731 08/15/13 1155 08/15/13 1727 08/15/13 2147 08/16/13 0726   GLUCAP 75 106* 123* 126* 71       Signed:  [facility] 08/16/2013, 8:50 AM

## 2013-08-16 NOTE — Progress Notes (Signed)
Progress Note from the Palliative Medicine Team at [facility]  Subjective: I spoke with Jacqueline Washington and her son and daughter this morning. They have a lot of questions and appear fairly anxious this morning. They tell me that they have decided they are not ready for hospice and I educated them on self referral and they are able to change their mind at any point if they do want hospice involved. 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 They say they have seen her decline over the past few years and that they understand she will continue to decline - but they are hoping to recover some quality of life and gain as much length of life as possible. They have many questions about the diagnosis and progression of the aspergillus which I have directed them to Dr. Delford Field to further answer these questions (patient says she has seen Dr. Delford Field a long time and trusts him). They are also inquiring about a port-a-cath placement but I did tell them I do not believe she can tolerate this procedure due to her respiratory limitations. They are planning on going home today with home health.    Objective: Allergies  Allergen Reactions  . Chantix [Varenicline] Other (See Comments)    Unknown " blurry vision"  . Hctz [Hydrochlorothiazide] Other (See Comments)    unknown  . Penicillins Rash  . Sulfonamide Derivatives Rash  . Tetanus Toxoid Other (See Comments)    Knot on arm   Scheduled Meds: . enoxaparin (LOVENOX) injection  40 mg Subcutaneous Q24H  . feeding supplement (ENSURE COMPLETE)  237 mL Oral BID BM  . furosemide  40 mg Oral Daily  . insulin aspart  0-9 Units Subcutaneous TID WC  . leflunomide  20 mg Oral Daily  . levalbuterol  0.63 mg Nebulization Q6H  . levofloxacin  750 mg Oral Daily  . pantoprazole  40 mg Oral Daily  . potassium chloride SA  60 mEq  Oral Daily  . predniSONE  40 mg Oral Q breakfast  . sodium chloride  3 mL Intravenous Q12H  . tiotropium  18 mcg Inhalation Daily  . voriconazole  200 mg Oral Q12H   Continuous Infusions:  PRN Meds:.sodium chloride, HYDROcodone-acetaminophen, morphine CONCENTRATE, senna-docusate, sodium chloride  BP 135/77  Pulse 103  Temp(Src) 98 F (36.7 C) (Oral)  Resp 28  Ht 5\' 4"  (1.626 m)  Wt 96 kg (211 lb 10.3 oz)  BMI 36.31 kg/m2  SpO2 94%   PPS: 40%     Intake/Output Summary (Last 24 hours) at 08/16/13 1311 Last data filed at 08/16/13 1200  Gross per 24 hour  Intake    450 ml  Output   7325 ml  Net  -6875 ml      LBM: 08/13/13      Physical Exam:  General: NAD, chronically ill appearing HEENT: [place]/AT, moist mucous membranes, no JVD Chest: Expiratory wheezing, bibasilar rhonchi, no labored breathing CVS: RRR, S1 S2 Abdomen: Soft, NT, ND, +BS Ext: MAE, BLE trace edema Neuro: Alert & oriented x 4, follows commands  Labs: CBC    Component Value Date/Time   WBC 12.8* 08/13/2013 0545   RBC 3.48* 08/13/2013 0545   HGB 8.0* 08/13/2013 0545   HCT 27.9* 08/13/2013 0545   PLT 257 08/13/2013 0545   MCV 80.2 08/13/2013 0545   MCH 23.0* 08/13/2013  0545   MCHC 28.7* 08/13/2013 0545   RDW 18.3* 08/13/2013 0545   LYMPHSABS 0.4* 08/11/2013 0308   MONOABS 0.3 08/11/2013 0308   EOSABS 0.0 08/11/2013 0308   BASOSABS 0.0 08/11/2013 0308    BMET    Component Value Date/Time   NA 144 08/13/2013 0545   K 4.1 08/13/2013 0545   CL 102 08/13/2013 0545   CO2 31 08/13/2013 0545   GLUCOSE 151* 08/13/2013 0545   BUN 21 08/13/2013 0545   CREATININE 0.74 08/13/2013 0545   CREATININE 0.86 08/03/2013 1013   CALCIUM 8.8 08/13/2013 0545   GFRNONAA 89* 08/13/2013 0545   GFRAA >90 08/13/2013 0545    CMP     Component Value Date/Time   NA 144 08/13/2013 0545   K 4.1 08/13/2013 0545   CL 102 08/13/2013 0545   CO2 31 08/13/2013 0545   GLUCOSE 151* 08/13/2013 0545   BUN 21 08/13/2013 0545   CREATININE 0.74  08/13/2013 0545   CREATININE 0.86 08/03/2013 1013   CALCIUM 8.8 08/13/2013 0545   PROT 7.1 08/10/2013 2241   ALBUMIN 3.2* 08/03/2013 1013   AST 12 08/03/2013 1013   ALT 13 08/03/2013 1013   ALKPHOS 71 08/03/2013 1013   BILITOT 0.4 08/03/2013 1013   GFRNONAA 89* 08/13/2013 0545   GFRAA >90 08/13/2013 0545    Assessment and Plan: 1. Code Status: DNR 2. Symptom Control: 1. Anxiety/Agitation: Consider a trial of low dose Xanax if needed. She does complain of anxiety and states of "panic" at home.  2. Pain: Roxanol prn.   3. Bowel Regimen: Senna S prn.  4. Weakness: Continue medical management. Continue PT at home.  3. Psycho/Social: Emotional support provided to patient and family.  4. Disposition: Home with HH.    Time In Time Out Total Time Spent with Patient Total Overall Time  0800 0840     Greater than 50%  of this time was spent counseling and coordinating care related to the above assessment and plan.  Yong Channel, NP Palliative Medicine Team Pager # [phone] (M-F 8a-5p) Team Phone # [phone] (Nights/Weekends)   1

## 2013-08-16 NOTE — Progress Notes (Signed)
CARE MANAGEMENT NOTE 08/16/2013  Patient:  Jacqueline Washington, Jacqueline Washington   Account Number:  [id number]  Date Initiated:  08/11/2013  Documentation initiated by:  Akerson,RHONDA  Subjective/Objective Assessment:   hx of copd,poss tia versus cva on admission, abnormal labs, poss pulmonary fungal infection     Action/Plan:   home when stable pt may want home hospice at dc is following by [facility].   Anticipated DC Date:  08/16/2013   Anticipated DC Plan:  HOME W HOME HEALTH SERVICES  In-house referral  NA      DC Planning Services  CM consult      [facility] Choice  Resumption Of Svcs/PTA Provider   Choice offered to / List presented to:  [name]   DME arranged  OXYGEN      DME agency  [facility]     [facility] arranged  HH-1 RN  HH-4 NURSE'S AIDE      HH agency  [facility]   Status of service:  Completed, signed off Medicare Important Message given?  NA - LOS <3 / Initial given by admissions (If response is "NO", the following Medicare IM given date fields will be blank) Date Medicare IM given:   Date Additional Medicare IM given:    Discharge Disposition:  HOME W HOME HEALTH SERVICES  Per UR Regulation:  Reviewed for med. necessity/level of care/duration of stay  If discussed at Long Length of Stay Meetings, dates discussed:   08/16/2013    Comments:  [id number]/Rhonda Dubuc,Rn,BSN,CCM: Patient has chosen not to use home hospice at this time. But will [facility] for r[facility], aide and o2. Spoke to all parties involved and patient does have the 02 concentrator at home.  [facility] for ride home  [id number]/pt and family have chosen to take patient home with hospice and palliative of .  Valente David is aware.  [id number]/[id number] Earlene Plater, RN, BSN, CCM, [phone] Chart reviewed for update of needs and condition. palliative care family meeting is scheduled  for [id number] at 1230pm.  [id number] Earlene Plater, RN, BSN, CCM: CHART REVIEWED AND  UPDATED.  Next chart review due on [id number]. NO DISCHARGE NEEDS PRESENT AT THIS TIME WILL CONTINUE TO FOLLOW. CASE MANAGEMENT [phone] Golda Acre, RN Case Manager Addendum CASE MANAGEMENT Progress Notes Service date: 08/11/2013 3:33 PM Per case management referral request list of area hospice providers given to patient and family.    Patient explained that she does not have cancer.  But I did explain to patient and family member present that hospice is not just for cancer but offers comfort care and support for various conditions.  Rhonda Razon,RN,BSN,CCM Original Order   Ordered On  Ordered By   Thu Aug 11, 2013 11:51 AM  Duayne Cal, NP   Would like to set up home hospice as well.

## 2013-08-16 NOTE — Discharge Instructions (Signed)
Call your regular medical doctor and advanced home health care as needed/ F/u with PCP in two weeks.

## 2013-08-16 NOTE — Progress Notes (Signed)
[facility]  [facility] is providing the following services: Patient is already active with [facility] for oxygen at 2-3 lpm cont  If patient discharges after hours, please call [phone].   Renard Hamper 08/16/2013, 10:53 AM

## 2013-08-16 NOTE — Progress Notes (Signed)
Late Entry Follow-up with pt/family and discussed with Dr Sharl Ma: at this time family wants to move forward with [facility] services and does not wish to engage hospice at this time; daughter Sma/Pt has [facility] contact information and will discuss with Dr Delford Field when time is right. [facility] and [facility] Rhonda notified of family decision Thank you Valente David, RN [phone]

## 2013-08-16 NOTE — Progress Notes (Signed)
[id number]/YDXAJO Brallier,Rn,BSN,CCM: Patient has chosen not to use home hospice at this time.  But will [facility] for rn, aide and o2.  Spoke to all parties involved and patient does have the 02 concentrator at home.  [facility] will supply travel O2 for ride home

## 2013-08-17 LAB — CULTURE, BLOOD (ROUTINE X 2)
Culture: NO GROWTH
Culture: NO GROWTH

## 2013-08-18 LAB — CULTURE, RESPIRATORY W GRAM STAIN

## 2013-08-18 LAB — CULTURE, RESPIRATORY: Culture: NORMAL

## 2013-08-22 ENCOUNTER — Ambulatory Visit: Payer: Self-pay | Admitting: Physician Assistant

## 2013-08-23 ENCOUNTER — Other Ambulatory Visit: Payer: Self-pay | Admitting: Physician Assistant

## 2013-08-23 ENCOUNTER — Ambulatory Visit: Payer: Self-pay | Admitting: Physician Assistant

## 2013-08-23 ENCOUNTER — Encounter: Payer: Self-pay | Admitting: Physician Assistant

## 2013-08-23 ENCOUNTER — Ambulatory Visit (INDEPENDENT_AMBULATORY_CARE_PROVIDER_SITE_OTHER): Payer: Medicare Other | Admitting: Physician Assistant

## 2013-08-23 VITALS — BP 122/64 | HR 100 | Temp 97.9°F | Resp 16

## 2013-08-23 DIAGNOSIS — Z0000 Encounter for general adult medical examination without abnormal findings: Secondary | ICD-10-CM

## 2013-08-23 DIAGNOSIS — I10 Essential (primary) hypertension: Secondary | ICD-10-CM

## 2013-08-23 DIAGNOSIS — E669 Obesity, unspecified: Secondary | ICD-10-CM

## 2013-08-23 DIAGNOSIS — Z1331 Encounter for screening for depression: Secondary | ICD-10-CM

## 2013-08-23 DIAGNOSIS — T148XXA Other injury of unspecified body region, initial encounter: Secondary | ICD-10-CM

## 2013-08-23 DIAGNOSIS — E119 Type 2 diabetes mellitus without complications: Secondary | ICD-10-CM

## 2013-08-23 DIAGNOSIS — J189 Pneumonia, unspecified organism: Secondary | ICD-10-CM

## 2013-08-23 DIAGNOSIS — E785 Hyperlipidemia, unspecified: Secondary | ICD-10-CM

## 2013-08-23 DIAGNOSIS — E559 Vitamin D deficiency, unspecified: Secondary | ICD-10-CM

## 2013-08-23 DIAGNOSIS — M069 Rheumatoid arthritis, unspecified: Secondary | ICD-10-CM

## 2013-08-23 DIAGNOSIS — E039 Hypothyroidism, unspecified: Secondary | ICD-10-CM

## 2013-08-23 DIAGNOSIS — J984 Other disorders of lung: Secondary | ICD-10-CM

## 2013-08-23 DIAGNOSIS — Z79899 Other long term (current) drug therapy: Secondary | ICD-10-CM

## 2013-08-23 LAB — CBC WITH DIFFERENTIAL/PLATELET
Basophils Absolute: 0 10*3/uL (ref 0.0–0.1)
Basophils Relative: 0 % (ref 0–1)
EOS ABS: 0.1 10*3/uL (ref 0.0–0.7)
Eosinophils Relative: 1 % (ref 0–5)
HEMATOCRIT: 32.9 % — AB (ref 36.0–46.0)
HEMOGLOBIN: 10 g/dL — AB (ref 12.0–15.0)
LYMPHS ABS: 0.7 10*3/uL (ref 0.7–4.0)
Lymphocytes Relative: 7 % — ABNORMAL LOW (ref 12–46)
MCH: 23 pg — AB (ref 26.0–34.0)
MCHC: 30.4 g/dL (ref 30.0–36.0)
MCV: 75.6 fL — ABNORMAL LOW (ref 78.0–100.0)
MONOS PCT: 3 % (ref 3–12)
Monocytes Absolute: 0.3 10*3/uL (ref 0.1–1.0)
Neutro Abs: 9.4 10*3/uL — ABNORMAL HIGH (ref 1.7–7.7)
Neutrophils Relative %: 89 % — ABNORMAL HIGH (ref 43–77)
Platelets: 260 10*3/uL (ref 150–400)
RBC: 4.35 MIL/uL (ref 3.87–5.11)
RDW: 21.9 % — ABNORMAL HIGH (ref 11.5–15.5)
WBC: 10.6 10*3/uL — ABNORMAL HIGH (ref 4.0–10.5)

## 2013-08-23 LAB — BASIC METABOLIC PANEL WITH GFR
BUN: 14 mg/dL (ref 6–23)
CALCIUM: 8.5 mg/dL (ref 8.4–10.5)
CHLORIDE: 99 meq/L (ref 96–112)
CO2: 29 meq/L (ref 19–32)
CREATININE: 0.81 mg/dL (ref 0.50–1.10)
GFR, Est African American: >89 mL/min
GFR, Est Non African American: 78 mL/min
GLUCOSE: 159 mg/dL — AB (ref 70–99)
Potassium: 3.9 mEq/L (ref 3.5–5.3)
Sodium: 139 mEq/L (ref 135–145)

## 2013-08-23 LAB — PROTIME-INR
INR: 0.97 (ref <1.50)
PROTHROMBIN TIME: 12.8 s (ref 11.6–15.2)

## 2013-08-23 LAB — LIPID PANEL
CHOLESTEROL: 175 mg/dL (ref 0–200)
HDL: 45 mg/dL (ref >39)
LDL Cholesterol: 78 mg/dL (ref 0–99)
Total CHOL/HDL Ratio: 3.9 Ratio
Triglycerides: 260 mg/dL — ABNORMAL HIGH (ref <150)
VLDL: 52 mg/dL — ABNORMAL HIGH (ref 0–40)

## 2013-08-23 LAB — HEPATIC FUNCTION PANEL
ALBUMIN: 3.3 g/dL — AB (ref 3.5–5.2)
ALK PHOS: 78 U/L (ref 39–117)
ALT: 16 U/L (ref 0–35)
AST: 19 U/L (ref 0–37)
Bilirubin, Direct: 0.1 mg/dL (ref 0.0–0.3)
Indirect Bilirubin: 0.3 mg/dL (ref 0.2–1.2)
Total Bilirubin: 0.4 mg/dL (ref 0.2–1.2)
Total Protein: 6.2 g/dL (ref 6.0–8.3)

## 2013-08-23 LAB — TSH: TSH: 0.56 u[IU]/mL (ref 0.350–4.500)

## 2013-08-23 LAB — HEMOGLOBIN A1C
Hgb A1c MFr Bld: 6.4 % — ABNORMAL HIGH (ref <5.7)
MEAN PLASMA GLUCOSE: 137 mg/dL — AB (ref <117)

## 2013-08-23 LAB — APTT: aPTT: 23 seconds — ABNORMAL LOW (ref 24–37)

## 2013-08-23 LAB — MAGNESIUM: Magnesium: 1.8 mg/dL (ref 1.5–2.5)

## 2013-08-23 MED ORDER — WHITE PETROLATUM GEL: 1.0000 "application " | Status: DC | PRN

## 2013-08-23 NOTE — Progress Notes (Addendum)
Subjective:   Jacqueline Washington is a 63 y.o. cushingoid appearing female who presents for Medicare Annual Wellness Visit and 3 month follow up on hypertension, diabetes, hyperlipidemia, vitamin D def. Date of last medicare wellness visit is unknown.    She had a recent hospital visit from 02/26-03/03 for acute on chronic respiratory failure and possible aspirgillus pneumonia, hospice approached the patient but she does not want care through them yet. She is back at home, in wheel chair, keeps O2 at 6. Denies increasing fatigue. When she states she has to turn up the O2 to 8L. She has bad veins, had a PICC line fail and she would like a porta cath for blood/injections. She was given a transfusion in the hospital, her last CBC was  O2 96.  Lab Results  Component Value Date   WBC 12.8* 08/13/2013   HGB 8.0* 08/13/2013   HCT 27.9* 08/13/2013   MCV 80.2 08/13/2013   PLT 257 08/13/2013   Lab Results  Component Value Date   CREATININE 0.74 08/13/2013   BUN 21 08/13/2013   NA 144 08/13/2013   K 4.1 08/13/2013   CL 102 08/13/2013   CO2 31 08/13/2013    Her blood pressure has been controlled at home, today their BP is BP: 122/64 mmHg She denies chest pain, shortness of breath, dizziness.  Her cholesterol is diet controlled. Her cholesterol is not controlled. The cholesterol last visit was:   Lab Results  Component Value Date   CHOL 222* 05/03/2013   HDL 65 05/03/2013   LDLCALC 136* 05/03/2013   TRIG 103 05/03/2013   CHOLHDL 3.4 05/03/2013   She has been working on diet and exercise for prediabetes, and has no polyuria or polydipsia, no chest pain, dyspnea or TIA's, no numbness, tingling or pain in extremities. Last A1C in the office was:  Lab Results  Component Value Date   HGBA1C 6.4* 06/22/2013   Patient is on Vitamin D supplement.   Names of Other Physician/Practitioners you currently use: 1. [facility]- here for primary care 2. , eye doctor, last visit  was 1 year ago 3. ?, dentist, last visit unknown Patient Care Team: Lucky Cowboy, MD as PCP - General (Internal Medicine) Storm Frisk, MD as Attending Physician (Pulmonary Disease)   Medication Review Current Outpatient Prescriptions on File Prior to Visit  Medication Sig Dispense Refill  . albuterol (PROVENTIL) (2.5 MG/3ML) 0.083% nebulizer solution Take 3 mLs (2.5 mg total) by nebulization 4 (four) times daily. DX 496  360 mL  6  . allopurinol (ZYLOPRIM) 100 MG tablet Take 100 mg by mouth daily.      Marland Kitchen ALPRAZolam (XANAX) 0.5 MG tablet Take 1 tablet (0.5 mg total) by mouth 3 (three) times daily as needed for anxiety.  30 tablet  0  . cetirizine (ZYRTEC) 10 MG tablet Take 10 mg by mouth daily.      . feeding supplement, ENSURE COMPLETE, (ENSURE COMPLETE) LIQD Take 237 mLs by mouth 2 (two) times daily between meals.  10 Bottle  0  . Fluticasone-Salmeterol (ADVAIR) 500-50 MCG/DOSE AEPB Inhale 1 puff into the lungs 2 (two) times daily.      . furosemide (LASIX) 40 MG tablet Take 40 mg by mouth daily.       Marland Kitchen ipratropium (ATROVENT) 0.02 % nebulizer solution Take 2.5 mLs (0.5 mg total) by nebulization every 6 (six) hours as needed for wheezing or shortness of breath. Dx 496  300 mL  12  .  leflunomide (ARAVA) 20 MG tablet Take 20 mg by mouth daily.      Marland Kitchen levothyroxine (SYNTHROID, LEVOTHROID) 50 MCG tablet TAKE 1 TABLET EVERY DAY  90 tablet  1  . montelukast (SINGULAIR) 10 MG tablet Take 1 tablet by mouth daily.      Marland Kitchen morphine 10 MG/5ML solution Take 2.5 mLs (5 mg total) by mouth every 6 (six) hours as needed for severe pain (Shortness of breath).  120 mL  0  . nystatin cream (MYCOSTATIN) Apply 1 application topically daily as needed (under breasts).      . pantoprazole (PROTONIX) 40 MG tablet Take 1 tablet (40 mg total) by mouth daily.  30 tablet  6  . potassium chloride SA (K-DUR,KLOR-CON) 20 MEQ tablet Take 60 mEq by mouth daily.      . predniSONE (DELTASONE) 20 MG tablet Reduce to 35  mg daily for 7days then reduce to 30mg  per day  for seven days, then reduce to 25 mg po daily for seven days and then reduce to 20 mg po daily and stay on 20 mg po daily (use 20mg  and 5mg  pred tabs)  60 tablet  1  . tiotropium (SPIRIVA HANDIHALER) 18 MCG inhalation capsule Place 1 capsule (18 mcg total) into inhaler and inhale daily.  30 capsule  12  . voriconazole (VFEND) 200 MG tablet Take 1 tablet (200 mg total) by mouth every 12 (twelve) hours.  60 tablet  2   No current facility-administered medications on file prior to visit.    Current Problems (verified) Patient Active Problem List   Diagnosis Date Noted  . Palliative care encounter 08/12/2013  . DNR (do not resuscitate) 08/12/2013  . Pulmonary aspergillosis 08/11/2013  . Acute on chronic respiratory failure with hypoxia 08/10/2013  . HCAP (healthcare-associated pneumonia) 08/10/2013  . Unspecified essential hypertension 06/22/2013  . Other abnormal glucose 06/22/2013  . Obesity (BMI 30-39.9) 05/03/2013  . Hyperlipidemia   . Hypertension   . Vitamin D deficiency   . Chronic respiratory failure 03/31/2013  . Pneumonitis- recurrent  03/23/2013  . GERD (gastroesophageal reflux disease) 11/11/2012  . Rheumatoid arthritis(714.0) 11/08/2012  . Physical deconditioning 11/08/2012  . Protein-calorie malnutrition, severe 10/18/2012  . Hypothyroidism 10/15/2012  . Diabetes mellitus, type II 10/15/2012  . Chronic diastolic heart failure, NYHA class 1 09/22/2012  . Lower extremity edema 09/22/2012  . Pulmonary nodules 08/13/2011  . Copd Gold C  04/22/2007    Screening Tests Health Maintenance  Topic Date Due  . Foot Exam  10/27/1960  . Ophthalmology Exam  10/27/1960  . Pap Smear  10/27/1968  . Tetanus/tdap  10/27/1969  . Zostavax  10/28/2010  . Hemoglobin A1c  12/20/2013  . Influenza Vaccine  01/14/2014  . Mammogram  04/15/2014  . Urine Microalbumin  05/03/2014  . Pneumococcal Polysaccharide Vaccine (##2) 03/03/2017  .  Colonoscopy  05/28/2017    Health Maintenance Topics with due status: Overdue     Topic Date Due   FOOT EXAM 10/27/1960   OPHTHALMOLOGY EXAM 10/27/1960   PAP SMEAR 10/27/1968   TETANUS/TDAP 10/27/1969   ZOSTAVAX 10/28/2010    Immunization History  Administered Date(s) Administered  . Influenza Split 03/03/2012  . Influenza,inj,Quad PF,36+ Mos 03/31/2013  . Pneumococcal Polysaccharide-23 03/03/2012    Preventative care: Last colonoscopy: 2013 Last mammogram: 03/2012 Last pap smear/pelvic exam: 2009 DEXA: ?  Prior vaccinations: TD or Tdap: allergy  Influenza: 2014 Pneumococcal: 2013 Shingles/Zostavax: declines  History reviewed: allergies, current medications, past family history, past medical history, past  social history, past surgical history and problem list  Risk Factors: Osteoporosis: postmenopausal estrogen deficiency, dietary calcium and/or vitamin D deficiency, glucocorticoid use and amenorrhea History of fracture in the past year: no  Tobacco History  Smoking status  . Former Smoker -- 3.00 packs/day for 43 years  . Types: Cigarettes  . Quit date: 04/24/2007  Smokeless tobacco  . Never Used   She does not smoke.  Patient is a former smoker. Are there smokers in your home (other than you)?  No  Alcohol Current alcohol use: none  Caffeine Current caffeine use: denies use  Exercise Exercise limitations: The patient is experiencing exercise intolerance (due to lungs). Current exercise: none  Nutrition/Diet Current diet: not asked  Cardiac risk factors: advanced age (older than 79 for men, 11 for women), diabetes mellitus, dyslipidemia, family history of premature cardiovascular disease, hypertension, obesity (BMI >= 30 kg/m2), sedentary lifestyle and smoking/ tobacco exposure.  Depression Screen Nurse depression screen reviewed.  (Note: if answer to either of the following is "Yes", a more complete depression screening is indicated)   Q1: Over  the past two weeks, have you felt down, depressed or hopeless? Yes  Q2: Over the past two weeks, have you felt little interest or pleasure in doing things? Yes  Have you lost interest or pleasure in daily life? Yes  Do you often feel hopeless? Yes  Do you cry easily over simple problems? Yes  Activities of Daily Living Nurse ADLs screen reviewed.  In your present state of health, do you have any difficulty performing the following activities?:  Driving? Yes Managing money?  Yes Feeding yourself? Yes Getting from bed to chair? Yes Climbing a flight of stairs? Yes Preparing food and eating?: Yes Bathing or showering? Yes Getting dressed: Yes Getting to the toilet? Yes Using the toilet:Yes Moving around from place to place: Yes In the past year have you fallen or had a near fall?:No   Are you sexually active?  No  Do you have more than one partner?  No  Vision Difficulties: Yes  Hearing Difficulties: No Do you often ask people to speak up or repeat themselves? No Do you experience ringing or noises in your ears? No Do you have difficulty understanding soft or whispered voices? No  Cognition  Do you feel that you have a problem with memory? Yes  Do you often misplace items? Yes  Do you feel safe at home?  Yes  Advanced directives Does patient have a Health Care Power of Attorney? Yes Does patient have a Living Will? Yes    Objective:     Vision and hearing screens reviewed.   Blood pressure 122/64, pulse 100, temperature 97.9 F (36.6 C), resp. rate 16. There is no weight on file to calculate BMI.  General appearance: alert, no distress, WD/WN,  female Cognitive Testing  Alert? Yes  Normal Appearance?Yes  Oriented to person? Yes  Place? Yes   Time? Yes  Recall of three objects?  Yes  Can perform simple calculations? Yes  Displays appropriate judgment?Yes  Can read the correct time from a watch face?Yes  HEENT: normocephalic, sclerae anicteric, TMs pearly,  nares patent, no discharge or erythema, pharynx normal Oral cavity: MMM, no lesions Neck: cushingoid, supple, no lymphadenopathy, no thyromegaly, no masses Heart: RRR, 3/6 murmur and distant heart sounds Lungs: Decreased breath sounds bilateral the rhonci/wheezing diffuse Abdomen: +bs, soft, obese, non tender, non distended, no masses, no hepatomegaly, no splenomegaly Musculoskeletal: Patient decreased strength in all extremities and  in wheel chair.  Extremities: +1 edema, no cyanosis Pulses: 1+ symmetric, upper and lower extremities Neurological: alert, oriented x 3, CN2-12 intact, DTRs 2+ throughout, no cerebellar signs, wheel chair bound, good senstation bilateral feet Psychiatric: normal affect, behavior normal, pleasant  Breast:defer Gyn: defer.   Rectal: defer   Assessment:   Depression- does not want treatment and states due to illness Need for hospice- patient is concerned that once starting hospice that she will not receive treatment. Explained that this is not the case, will contact hospice.  Failure of PICC line X 2, suggest getting porta cath for venous access, referral sent 1. Hypertension - CBC with Differential - BASIC METABOLIC PANEL WITH GFR - Hepatic function panel  2. Pneumonitis- recurrent  Continue follow up with Dr. Delford FieldWright  3. Hypothyroidism - TSH  4. Diabetes mellitus, type II - Hemoglobin A1c  5. Rheumatoid arthritis(714.0) Continue follow up with Dr.   6. Hyperlipidemia - Lipid panel  7. Vitamin D deficiency  8. Obesity (BMI 30-39.9) Obesity with co morbidities- long discussion about weight loss, diet, and exercise  9. Encounter for long-term (current) use of other medications - Magnesium  10. Bruising - PTT (Partial Thromboplastin Time) - PT (Prothrombin Time)   Plan:   During the course of the visit the patient was educated and counseled about appropriate screening and preventive services including:    Pneumococcal vaccine    Influenza vaccine  Screening electrocardiogram  Screening mammography  Screening Pap smear and pelvic exam   Bone densitometry screening  Diabetes screening  Glaucoma screening  Nutrition counseling   Screening recommendations, referrals:  Vaccinations: Tdap vaccine no  Influenza vaccine yes Pneumococcal vaccine yes Shingles vaccine no Hep B vaccine no  Nutrition assessed and recommended  Colonoscopy not done Mammogram yes Pap smear not done Pelvic exam not done Recommended yearly ophthalmology/optometry visit for glaucoma screening and checkup Recommended yearly dental visit for hygiene and checkup Advanced directives - not done  Conditions/risks identified: BMI: Discussed weight loss, diet, and increase physical activity.  Increase physical activity: AHA recommends 150 minutes of physical activity a week.  Medications reviewed DEXA- declined Diabetes is at goal, ACE/ARB therapy: Yes. RA- on DMARD Urinary Incontinence is an issue: discussed non pharmacology and pharmacology options.  Fall risk: high- discussed PT, home fall assessment, medications.  End stage lung disease- suggest starting hospice.  Medicare Attestation I have personally reviewed: The patient's medical and social history Their use of alcohol, tobacco or illicit drugs Their current medications and supplements The patient's functional ability including ADLs,fall risks, home safety risks, cognitive, and hearing and visual impairment Diet and physical activities Evidence for depression or mood disorders  The patient's weight, height, BMI, and visual acuity have been recorded in the chart.  I have made referrals, counseling, and provided education to the patient based on review of the above and I have provided the patient with a written personalized care plan for preventive services.     Quentin MullingCollier, Lelia Jons, PA-C   08/23/2013

## 2013-08-23 NOTE — Patient Instructions (Signed)
Preventative Care for Adults - Female      MAINTAIN REGULAR HEALTH EXAMS:  A routine yearly physical is a good way to check in with your primary care provider about your health and preventive screening. It is also an opportunity to share updates about your health and any concerns you have, and receive a thorough all-over exam.   Most health insurance companies pay for at least some preventative services.  Check with your health plan for specific coverages.  WHAT PREVENTATIVE SERVICES DO WOMEN NEED?  Adult women should have their weight and blood pressure checked regularly.   Women age 35 and older should have their cholesterol levels checked regularly.  Women should be screened for cervical cancer with a Pap smear and pelvic exam beginning at either age 21, or 3 years after they become sexually activity.    Breast cancer screening generally begins at age 40 with a mammogram and breast exam by your primary care provider.    Beginning at age 50 and continuing to age 75, women should be screened for colorectal cancer.  Certain people may need continued testing until age 85.  Updating vaccinations is part of preventative care.  Vaccinations help protect against diseases such as the flu.  Osteoporosis is a disease in which the bones lose minerals and strength as we age. Women ages 65 and over should discuss this with their caregivers, as should women after menopause who have other risk factors.  Lab tests are generally done as part of preventative care to screen for anemia and blood disorders, to screen for problems with the kidneys and liver, to screen for bladder problems, to check blood sugar, and to check your cholesterol level.  Preventative services generally include counseling about diet, exercise, avoiding tobacco, drugs, excessive alcohol consumption, and sexually transmitted infections.    GENERAL RECOMMENDATIONS FOR GOOD HEALTH:  Healthy diet:  Eat a variety of foods, including  fruit, vegetables, animal or vegetable protein, such as meat, fish, chicken, and eggs, or beans, lentils, tofu, and grains, such as rice.  Drink plenty of water daily.  Decrease saturated fat in the diet, avoid lots of red meat, processed foods, sweets, fast foods, and fried foods.  Exercise:  Aerobic exercise helps maintain good heart health. At least 30-40 minutes of moderate-intensity exercise is recommended. For example, a brisk walk that increases your heart rate and breathing. This should be done on most days of the week.   Find a type of exercise or a variety of exercises that you enjoy so that it becomes a part of your daily life.  Examples are running, walking, swimming, water aerobics, and biking.  For motivation and support, explore group exercise such as aerobic class, spin class, Zumba, Yoga,or  martial arts, etc.    Set exercise goals for yourself, such as a certain weight goal, walk or run in a race such as a 5k walk/run.  Speak to your primary care provider about exercise goals.  Disease prevention:  If you smoke or chew tobacco, find out from your caregiver how to quit. It can literally save your life, no matter how long you have been a tobacco user. If you do not use tobacco, never begin.   Maintain a healthy diet and normal weight. Increased weight leads to problems with blood pressure and diabetes.   The Body Mass Index or BMI is a way of measuring how much of your body is fat. Having a BMI above 27 increases the risk of heart disease,   diabetes, hypertension, stroke and other problems related to obesity. Your caregiver can help determine your BMI and based on it develop an exercise and dietary program to help you achieve or maintain this important measurement at a healthful level.  High blood pressure causes heart and blood vessel problems.  Persistent high blood pressure should be treated with medicine if weight loss and exercise do not work.   Fat and cholesterol leaves  deposits in your arteries that can block them. This causes heart disease and vessel disease elsewhere in your body.  If your cholesterol is found to be high, or if you have heart disease or certain other medical conditions, then you may need to have your cholesterol monitored frequently and be treated with medication.   Ask if you should have a cardiac stress test if your history suggests this. A stress test is a test done on a treadmill that looks for heart disease. This test can find disease prior to there being a problem.  Menopause can be associated with physical symptoms and risks. Hormone replacement therapy is available to decrease these. You should talk to your caregiver about whether starting or continuing to take hormones is right for you.   Osteoporosis is a disease in which the bones lose minerals and strength as we age. This can result in serious bone fractures. Risk of osteoporosis can be identified using a bone density scan. Women ages 4 and over should discuss this with their caregivers, as should women after menopause who have other risk factors. Ask your caregiver whether you should be taking a calcium supplement and Vitamin D, to reduce the rate of osteoporosis.   Avoid drinking alcohol in excess (more than two drinks per day).  Avoid use of street drugs. Do not share needles with anyone. Ask for professional help if you need assistance or instructions on stopping the use of alcohol, cigarettes, and/or drugs.  Brush your teeth twice a day with fluoride toothpaste, and floss once a day. Good oral hygiene prevents tooth decay and gum disease. The problems can be painful, unattractive, and can cause other health problems. Visit your dentist for a routine oral and dental check up and preventive care every 6-12 months.   Look at your skin regularly.  Use a mirror to look at your back. Notify your caregivers of changes in moles, especially if there are changes in shapes, colors, a size  larger than a pencil eraser, an irregular border, or development of new moles.  Safety:  Use seatbelts 100% of the time, whether driving or as a passenger.  Use safety devices such as hearing protection if you work in environments with loud noise or significant background noise.  Use safety glasses when doing any work that could send debris in to the eyes.  Use a helmet if you ride a bike or motorcycle.  Use appropriate safety gear for contact sports.  Talk to your caregiver about gun safety.  Use sunscreen with a SPF (or skin protection factor) of 15 or greater.  Lighter skinned people are at a greater risk of skin cancer. Don't forget to also wear sunglasses in order to protect your eyes from too much damaging sunlight. Damaging sunlight can accelerate cataract formation.   Practice safe sex. Use condoms. Condoms are used for birth control and to help reduce the spread of sexually transmitted infections (or STIs).  Some of the STIs are gonorrhea (the clap), chlamydia, syphilis, trichomonas, herpes, HPV (human papilloma virus) and HIV (human immunodeficiency virus)  which causes AIDS. The herpes, HIV and HPV are viral illnesses that have no cure. These can result in disability, cancer and death.   Keep carbon monoxide and smoke detectors in your home functioning at all times. Change the batteries every 6 months or use a model that plugs into the wall.   Vaccinations:  Stay up to date with your tetanus shots and other required immunizations. You should have a booster for tetanus every 10 years. Be sure to get your flu shot every year, since 5%-20% of the [place] population comes down with the flu. The flu vaccine changes each year, so being vaccinated once is not enough. Get your shot in the fall, before the flu season peaks.   Other vaccines to consider:  Human Papilloma Virus or HPV causes cancer of the cervix, and other infections that can be transmitted from person to person. There is a vaccine for  HPV, and females should get immunized between the ages of 85 and 35. It requires a series of 3 shots.   Pneumococcal vaccine to protect against certain types of pneumonia.  This is normally recommended for adults age 17 or older.  However, adults younger than 63 years old with certain underlying conditions such as diabetes, heart or lung disease should also receive the vaccine.  Shingles vaccine to protect against Varicella Zoster if you are older than age 89, or younger than 63 years old with certain underlying illness.  Hepatitis A vaccine to protect against a form of infection of the liver by a virus acquired from food.  Hepatitis B vaccine to protect against a form of infection of the liver by a virus acquired from blood or body fluids, particularly if you work in health care.  If you plan to travel internationally, check with your local health department for specific vaccination recommendations.  Cancer Screening:  Breast cancer screening is essential to preventive care for women. All women age 92 and older should perform a breast self-exam every month. At age 75 and older, women should have their caregiver complete a breast exam each year. Women at ages 89 and older should have a mammogram (x-ray film) of the breasts. Your caregiver can discuss how often you need mammograms.    Cervical cancer screening includes taking a Pap smear (sample of cells examined under a microscope) from the cervix (end of the uterus). It also includes testing for HPV (Human Papilloma Virus, which can cause cervical cancer). Screening and a pelvic exam should begin at age 53, or 3 years after a woman becomes sexually active. Screening should occur every year, with a Pap smear but no HPV testing, up to age 34. After age 81, you should have a Pap smear every 3 years with HPV testing, if no HPV was found previously.   Most routine colon cancer screening begins at the age of 11. On a yearly basis, doctors may provide  special easy to use take-home tests to check for hidden blood in the stool. Sigmoidoscopy or colonoscopy can detect the earliest forms of colon cancer and is life saving. These tests use a small camera at the end of a tube to directly examine the colon. Speak to your caregiver about this at age 70, when routine screening begins (and is repeated every 5 years unless early forms of pre-cancerous polyps or small growths are found).    [place] is a care service which can be used by people who are terminally ill and in whom healing  is no longer thought possible. Hospice care is for people believed to have no more than 6 months to live. It is meant to help with the two largest fears near the end of life (the fears of dying and of being alone), as well as pain management, and an attempt to allow people to pass away comfortably at home.  Hospice staff:  Administer appropriate pain relief.  Provide nursing care.  Offer reassurance and support to loved ones and family members.  Provide services to keep people comfortable at home or in a hospice facility. Together, you can see to it that your loved one is not alone during this last and important phase of life. You, your family, and your caregivers help you decide when hospice services should begin. If your condition improves or the disease goes into remission, you can be discharged from the hospice program. You can return to hospice care at a later time if needed. The hospice philosophy recognizes death as the final stage of life. It helps patients continue an alert, pain-free life, and manage symptoms while surrounded by their loved ones. Hospice affirms life without hurrying death. Hospice care treats the person rather than the disease. It emphasizes quality of life with family-centered care. Hospice care involves the patient and family and helps them make decisions.  The care is designed to:  Relieve or decrease pain.  Control other  problems.  Provide as much quality time as possible.  Allow people to die with dignity. Unlike other medical care, the focus is no longer on curing disease. The goal of hospice care is to offer as high a quality of life as possible during the end of life. In this way, the last days of life may be spent with dignity.  With hospice care, instead of spending the last weeks or months in a hospital, a person is with loved ones in the home or a homelike setting. About 90 percent of hospice care is provided at home. But hospice is available wherever a person lives, including a nursing home or assisted-living residence. Some residential hospices designed specifically for hospice care also exist. Hospice care is available for many types of terminal illnesses. Hospice services are meant to serve both the patient and family members.  Comfort. In most cases, the individual stays in his or her home or in homelike surroundings instead of in a hospital. The core of hospice is a cooperative effort by family, friends and a team of professional and volunteer caregivers working together to meet your loved one's needs. This team supplies all necessary medicines and equipment. It works with both the person involved and family members to relieve pain and symptoms.  Support. Individuals enjoy the support of loved ones by receiving much of the basic care from family and friends. A nurse may lead the team and coordinates the day-to-day care. A doctor is also part of the team. Chaplains and social workers are available to counsel the family and their loved one. They make sure emotional, spiritual, and social needs are being met. Trained volunteers perform a wide variety of tasks as needed, such as:  Providing companionship.  Doing light housekeeping.  Preparing meals.  Running errands.  Providing respite for the family.  Improving quality of life. Caring for someone who is dying is emotionally and physically demanding.  This can be particularly true for family members who are primary caregivers. But you can take comfort in knowing that hospice is an act of love that can improve  the quality of life for all involved. Professionals are often available to tend to the needs of grieving family members as well.  Spiritual Care. Hospice care emphasizes the spiritual needs of you and your family. People differ in their spiritual needs and religious beliefs so spiritual care is individualized to meet the persons' and family's needs. It may include helping you to look at what death means to you, to say good-bye, or to perform a specific religious ceremony or ritual. HOW TO SELECT A PROGRAM Most hospice programs are run by nonprofit, independent organizations. Some are affiliated with hospitals, nursing homes or home health care agencies. Some are for-profit organizations. You can learn about existing hospice programs in your area from your health caregivers. ASK THE FOLLOWING:  What services are available to the patient?  What services are offered to the family?  Are bereavement services available?  How involved are the family members?  How involved is the doctor?  Who makes up the hospice care team? How are they trained or screened?  How will the individual's pain and symptoms be managed?  If circumstances change, can services be provided in different settings, such as the home or the hospital?  Is the program reviewed and licensed by the state or certified in some other way?  Are all costs covered by insurance? How much you pay for hospice care can vary greatly. It depends on the length and type of care necessary and your insurance coverage. Medicare and most private insurance plans, including managed care organizations, cover hospice care. Hospice is also covered by Medicaid in most states. Some hospice programs provide services on a sliding fee scale, based on your ability to pay. They may also provide some  durable medical equipment for support within the home. Document Released: 09/19/2003 Document Revised: 08/25/2011 Document Reviewed: 06/02/2005 [facility] Patient Information 2014 [place], [place].

## 2013-08-25 ENCOUNTER — Other Ambulatory Visit: Payer: Self-pay | Admitting: Physician Assistant

## 2013-08-25 ENCOUNTER — Ambulatory Visit (INDEPENDENT_AMBULATORY_CARE_PROVIDER_SITE_OTHER)
Admission: RE | Admit: 2013-08-25 | Discharge: 2013-08-25 | Disposition: A | Discharge status: Home / Self Care | Payer: Medicare Other | Source: Ambulatory Visit | Attending: Adult Health | Admitting: Adult Health

## 2013-08-25 ENCOUNTER — Encounter: Payer: Self-pay | Admitting: Adult Health

## 2013-08-25 ENCOUNTER — Ambulatory Visit (INDEPENDENT_AMBULATORY_CARE_PROVIDER_SITE_OTHER): Payer: Medicare Other | Admitting: Adult Health

## 2013-08-25 ENCOUNTER — Other Ambulatory Visit: Payer: Self-pay | Admitting: Adult Health

## 2013-08-25 VITALS — BP 116/92 | HR 118 | Temp 97.5°F | Ht 64.0 in | Wt 203.0 lb

## 2013-08-25 DIAGNOSIS — B441 Other pulmonary aspergillosis: Secondary | ICD-10-CM

## 2013-08-25 DIAGNOSIS — J189 Pneumonia, unspecified organism: Secondary | ICD-10-CM

## 2013-08-25 DIAGNOSIS — J9610 Chronic respiratory failure, unspecified whether with hypoxia or hypercapnia: Secondary | ICD-10-CM

## 2013-08-25 DIAGNOSIS — B4481 Allergic bronchopulmonary aspergillosis: Secondary | ICD-10-CM

## 2013-08-25 DIAGNOSIS — J449 Chronic obstructive pulmonary disease, unspecified: Secondary | ICD-10-CM

## 2013-08-25 NOTE — Assessment & Plan Note (Addendum)
Cont on current regimen on VFEND LFT yesterday were wnl  follow up Dr. Delford Field  Next week as planned

## 2013-08-25 NOTE — Progress Notes (Signed)
Subjective:    Patient ID: Jacqueline Washington, female    DOB: 14-May-1951, 63 y.o.   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  08/25/2013 Post Hospital follow up  Patient was admitted February 25 through 08/16/2013 for acute on chronic hypoxic respiratory failure with healthcare associated pneumonia. In the setting of rheumatoid arthritis pulmonary fibrosis and cavitary pulmonary aspergillosis. Patient was treated with IV antibiotics, slow prednisone taper, and nebulized bronchodilators. A 2-D echo showed grade 1 diastolic dysfunction. Aspergillus Ab was neg (<1.8) , Aspergillus ag 0.11 (neg) .  Fungal cx prelim neg. Sputum cx neg. BC neg  CT chest on January 25, showed severe chronic lung disease with emphysema. Pulmonary fibrosis, and traction bronchiectasis, with multiple lung cavities. There was a left sided pleural effusion with overlying atelectasis. New airspace opacity In the left upper lobe questionable fungal ball.  Started on VFEND at discharge .  Since discharge. Patient is feeling better.  She denies any hemoptysis, orthopnea, PND, or increased leg swelling. Reports breathing is improving every day since discharge.  no new complaints.  On prednisone 35mg  daily - to taper by 5mg  every 7d to baseline of 20mg  daily  On O2 6l/m at rest and 8/m with walking w/ sats >90%.  CXR w/ chronic changes , no acute process noted.      Review of Systems Constitutional:   No  weight loss, night sweats,  Fevers, chills,  +fatigue, or  lassitude.  HEENT:   No headaches,  Difficulty swallowing,  Tooth/dental problems, or  Sore throat,                No sneezing, itching, ear ache, nasal congestion, post nasal drip,   CV:  No chest pain,  Orthopnea, PND, swelling in lower extremities, anasarca, dizziness, palpitations, syncope.   GI  No heartburn, indigestion, abdominal  pain, nausea, vomiting, diarrhea, change in bowel habits, loss of appetite, bloody stools.   Resp:    No chest wall deformity  Skin: no rash or lesions.  GU: no dysuria, change in color of urine, no urgency or frequency.  No flank pain, no hematuria   MS:  No joint pain or swelling.  No decreased range of motion.  No back pain.  Psych:  No change in mood or affect. No depression or anxiety.  No memory loss.         Objective:   Physical Exam GEN: A/Ox3; pleasant , NAD, elderly , chronically ill appearing   HEENT:  [place]/AT,  EACs-clear, TMs-wnl, NOSE-clear, THROAT-clear, no lesions, no postnasal drip or exudate noted.   NECK:  Supple w/ fair ROM; no JVD; normal carotid impulses w/o bruits; no thyromegaly or nodules palpated; no lymphadenopathy.  RESP    Decreased BS in bases, tr exp wheeze on forced exp. no dullness to percussion  CARD:  RRR, no m/r/g  , tr  peripheral edema, pulses intact, no cyanosis or clubbing.  GI:   Soft & nt; nml bowel sounds; no organomegaly or masses detected.  Musco: Warm bil, no deformities or joint swelling noted.   Neuro: alert, no focal deficits noted.    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 There is emphysematous changewith bullous disease in both upper lobes. Small left effusion. No new opacity.       Assessment & Plan:

## 2013-08-25 NOTE — Patient Instructions (Addendum)
Continue on current regimen Taper prednisone as directed.  Follow up Dr. Delford Field  In 1 week as planned and As needed   Please contact office for sooner follow up if symptoms do not improve or worsen or seek emergency care

## 2013-08-25 NOTE — Assessment & Plan Note (Signed)
Continue on current regimen .  O2 to keep sats >90%.

## 2013-08-25 NOTE — Assessment & Plan Note (Signed)
Recent flare now resolving  Cont on current regimen  

## 2013-08-25 NOTE — Assessment & Plan Note (Signed)
Improved with abx  ? LUL consolidation vs fungal ball  Now on VFEND  Cont on current regimen

## 2013-08-26 ENCOUNTER — Ambulatory Visit: Payer: Medicare Other | Admitting: Critical Care Medicine

## 2013-08-29 ENCOUNTER — Emergency Department (HOSPITAL_COMMUNITY): Payer: Medicare Other

## 2013-08-29 ENCOUNTER — Ambulatory Visit (INDEPENDENT_AMBULATORY_CARE_PROVIDER_SITE_OTHER): Payer: Medicare Other | Admitting: Physician Assistant

## 2013-08-29 ENCOUNTER — Encounter: Payer: Self-pay | Admitting: Physician Assistant

## 2013-08-29 ENCOUNTER — Encounter (HOSPITAL_COMMUNITY): Payer: Self-pay | Admitting: Emergency Medicine

## 2013-08-29 ENCOUNTER — Inpatient Hospital Stay (HOSPITAL_COMMUNITY)
Admission: EM | Admit: 2013-08-29 | Discharge: 2013-09-08 | DRG: 196 | Disposition: A | Discharge status: Hospice | Payer: Medicare Other | Attending: Internal Medicine | Admitting: Internal Medicine

## 2013-08-29 VITALS — BP 102/60 | HR 130 | Temp 98.1°F | Resp 20

## 2013-08-29 DIAGNOSIS — R0682 Tachypnea, not elsewhere classified: Secondary | ICD-10-CM | POA: Diagnosis present

## 2013-08-29 DIAGNOSIS — Z66 Do not resuscitate: Secondary | ICD-10-CM

## 2013-08-29 DIAGNOSIS — J45901 Unspecified asthma with (acute) exacerbation: Secondary | ICD-10-CM

## 2013-08-29 DIAGNOSIS — Z79899 Other long term (current) drug therapy: Secondary | ICD-10-CM

## 2013-08-29 DIAGNOSIS — J441 Chronic obstructive pulmonary disease with (acute) exacerbation: Secondary | ICD-10-CM | POA: Diagnosis present

## 2013-08-29 DIAGNOSIS — Z825 Family history of asthma and other chronic lower respiratory diseases: Secondary | ICD-10-CM

## 2013-08-29 DIAGNOSIS — I5033 Acute on chronic diastolic (congestive) heart failure: Secondary | ICD-10-CM

## 2013-08-29 DIAGNOSIS — Z9981 Dependence on supplemental oxygen: Secondary | ICD-10-CM

## 2013-08-29 DIAGNOSIS — R7309 Other abnormal glucose: Secondary | ICD-10-CM

## 2013-08-29 DIAGNOSIS — J189 Pneumonia, unspecified organism: Secondary | ICD-10-CM

## 2013-08-29 DIAGNOSIS — M069 Rheumatoid arthritis, unspecified: Secondary | ICD-10-CM

## 2013-08-29 DIAGNOSIS — K5900 Constipation, unspecified: Secondary | ICD-10-CM

## 2013-08-29 DIAGNOSIS — R1013 Epigastric pain: Secondary | ICD-10-CM | POA: Diagnosis present

## 2013-08-29 DIAGNOSIS — E559 Vitamin D deficiency, unspecified: Secondary | ICD-10-CM

## 2013-08-29 DIAGNOSIS — T502X5A Adverse effect of carbonic-anhydrase inhibitors, benzothiadiazides and other diuretics, initial encounter: Secondary | ICD-10-CM | POA: Diagnosis present

## 2013-08-29 DIAGNOSIS — Z833 Family history of diabetes mellitus: Secondary | ICD-10-CM

## 2013-08-29 DIAGNOSIS — R600 Localized edema: Secondary | ICD-10-CM

## 2013-08-29 DIAGNOSIS — R0989 Other specified symptoms and signs involving the circulatory and respiratory systems: Secondary | ICD-10-CM

## 2013-08-29 DIAGNOSIS — J984 Other disorders of lung: Secondary | ICD-10-CM

## 2013-08-29 DIAGNOSIS — R0609 Other forms of dyspnea: Secondary | ICD-10-CM

## 2013-08-29 DIAGNOSIS — E1129 Type 2 diabetes mellitus with other diabetic kidney complication: Secondary | ICD-10-CM | POA: Diagnosis present

## 2013-08-29 DIAGNOSIS — R5381 Other malaise: Secondary | ICD-10-CM

## 2013-08-29 DIAGNOSIS — I5032 Chronic diastolic (congestive) heart failure: Secondary | ICD-10-CM

## 2013-08-29 DIAGNOSIS — R1011 Right upper quadrant pain: Secondary | ICD-10-CM

## 2013-08-29 DIAGNOSIS — R0600 Dyspnea, unspecified: Secondary | ICD-10-CM

## 2013-08-29 DIAGNOSIS — M0510 Rheumatoid lung disease with rheumatoid arthritis of unspecified site: Principal | ICD-10-CM | POA: Diagnosis present

## 2013-08-29 DIAGNOSIS — J471 Bronchiectasis with (acute) exacerbation: Secondary | ICD-10-CM

## 2013-08-29 DIAGNOSIS — D638 Anemia in other chronic diseases classified elsewhere: Secondary | ICD-10-CM | POA: Diagnosis present

## 2013-08-29 DIAGNOSIS — J449 Chronic obstructive pulmonary disease, unspecified: Secondary | ICD-10-CM

## 2013-08-29 DIAGNOSIS — T4275XA Adverse effect of unspecified antiepileptic and sedative-hypnotic drugs, initial encounter: Secondary | ICD-10-CM | POA: Diagnosis present

## 2013-08-29 DIAGNOSIS — E876 Hypokalemia: Secondary | ICD-10-CM | POA: Diagnosis present

## 2013-08-29 DIAGNOSIS — K5909 Other constipation: Secondary | ICD-10-CM | POA: Diagnosis present

## 2013-08-29 DIAGNOSIS — J9610 Chronic respiratory failure, unspecified whether with hypoxia or hypercapnia: Secondary | ICD-10-CM

## 2013-08-29 DIAGNOSIS — R079 Chest pain, unspecified: Secondary | ICD-10-CM

## 2013-08-29 DIAGNOSIS — E039 Hypothyroidism, unspecified: Secondary | ICD-10-CM

## 2013-08-29 DIAGNOSIS — I509 Heart failure, unspecified: Secondary | ICD-10-CM | POA: Diagnosis present

## 2013-08-29 DIAGNOSIS — J9620 Acute and chronic respiratory failure, unspecified whether with hypoxia or hypercapnia: Secondary | ICD-10-CM

## 2013-08-29 DIAGNOSIS — G8929 Other chronic pain: Secondary | ICD-10-CM

## 2013-08-29 DIAGNOSIS — Z87891 Personal history of nicotine dependence: Secondary | ICD-10-CM

## 2013-08-29 DIAGNOSIS — K219 Gastro-esophageal reflux disease without esophagitis: Secondary | ICD-10-CM

## 2013-08-29 DIAGNOSIS — E43 Unspecified severe protein-calorie malnutrition: Secondary | ICD-10-CM

## 2013-08-29 DIAGNOSIS — E669 Obesity, unspecified: Secondary | ICD-10-CM

## 2013-08-29 DIAGNOSIS — M109 Gout, unspecified: Secondary | ICD-10-CM | POA: Diagnosis present

## 2013-08-29 DIAGNOSIS — I10 Essential (primary) hypertension: Secondary | ICD-10-CM

## 2013-08-29 DIAGNOSIS — J9621 Acute and chronic respiratory failure with hypoxia: Secondary | ICD-10-CM

## 2013-08-29 DIAGNOSIS — F411 Generalized anxiety disorder: Secondary | ICD-10-CM | POA: Diagnosis present

## 2013-08-29 DIAGNOSIS — D696 Thrombocytopenia, unspecified: Secondary | ICD-10-CM | POA: Diagnosis present

## 2013-08-29 DIAGNOSIS — J4489 Other specified chronic obstructive pulmonary disease: Secondary | ICD-10-CM

## 2013-08-29 DIAGNOSIS — B441 Other pulmonary aspergillosis: Secondary | ICD-10-CM

## 2013-08-29 DIAGNOSIS — R918 Other nonspecific abnormal finding of lung field: Secondary | ICD-10-CM

## 2013-08-29 DIAGNOSIS — M25519 Pain in unspecified shoulder: Secondary | ICD-10-CM | POA: Diagnosis present

## 2013-08-29 DIAGNOSIS — E119 Type 2 diabetes mellitus without complications: Secondary | ICD-10-CM

## 2013-08-29 DIAGNOSIS — Z515 Encounter for palliative care: Secondary | ICD-10-CM

## 2013-08-29 DIAGNOSIS — R000 Tachycardia, unspecified: Secondary | ICD-10-CM | POA: Diagnosis present

## 2013-08-29 DIAGNOSIS — Z8249 Family history of ischemic heart disease and other diseases of the circulatory system: Secondary | ICD-10-CM

## 2013-08-29 DIAGNOSIS — E785 Hyperlipidemia, unspecified: Secondary | ICD-10-CM

## 2013-08-29 LAB — I-STAT TROPONIN, ED: TROPONIN I, POC: 0.01 ng/mL (ref 0.00–0.08)

## 2013-08-29 LAB — CBC
HCT: 37.9 % (ref 36.0–46.0)
Hemoglobin: 11.3 g/dL — ABNORMAL LOW (ref 12.0–15.0)
MCH: 23.3 pg — AB (ref 26.0–34.0)
MCHC: 29.8 g/dL — ABNORMAL LOW (ref 30.0–36.0)
MCV: 78.3 fL (ref 78.0–100.0)
PLATELETS: 218 10*3/uL (ref 150–400)
RBC: 4.84 MIL/uL (ref 3.87–5.11)
RDW: 21.7 % — ABNORMAL HIGH (ref 11.5–15.5)
WBC: 10.1 10*3/uL (ref 4.0–10.5)

## 2013-08-29 LAB — COMPREHENSIVE METABOLIC PANEL
ALT: 33 U/L (ref 0–35)
AST: 36 U/L (ref 0–37)
Albumin: 3.4 g/dL — ABNORMAL LOW (ref 3.5–5.2)
Alkaline Phosphatase: 108 U/L (ref 39–117)
BUN: 18 mg/dL (ref 6–23)
CALCIUM: 9.8 mg/dL (ref 8.4–10.5)
CO2: 31 mEq/L (ref 19–32)
Chloride: 82 mEq/L — ABNORMAL LOW (ref 96–112)
Creatinine, Ser: 0.8 mg/dL (ref 0.50–1.10)
GFR calc non Af Amer: 77 mL/min — ABNORMAL LOW (ref >90)
GFR, EST AFRICAN AMERICAN: 90 mL/min — AB (ref >90)
GLUCOSE: 173 mg/dL — AB (ref 70–99)
Potassium: 4.8 mEq/L (ref 3.7–5.3)
Sodium: 127 mEq/L — ABNORMAL LOW (ref 137–147)
TOTAL PROTEIN: 7.4 g/dL (ref 6.0–8.3)
Total Bilirubin: 0.5 mg/dL (ref 0.3–1.2)

## 2013-08-29 LAB — LIPASE, BLOOD: LIPASE: 30 U/L (ref 11–59)

## 2013-08-29 MED ORDER — SODIUM CHLORIDE 0.9 % IJ SOLN: 3.0000 mL | INTRAMUSCULAR | Status: DC | PRN

## 2013-08-29 MED ORDER — WHITE PETROLATUM GEL
1.0000 "application " | Status: DC | PRN
  Filled 2013-08-29: qty 5

## 2013-08-29 MED ORDER — ALLOPURINOL 100 MG PO TABS
100.0000 mg | ORAL_TABLET | Freq: Every day | ORAL | Status: DC
  Administered 2013-08-30 – 2013-09-06 (×6): 100 mg via ORAL
  Filled 2013-08-29 (×10): qty 1

## 2013-08-29 MED ORDER — ENOXAPARIN SODIUM 40 MG/0.4ML ~~LOC~~ SOLN
40.0000 mg | SUBCUTANEOUS | Status: DC
  Administered 2013-08-29 – 2013-09-07 (×10): 40 mg via SUBCUTANEOUS
  Filled 2013-08-29 (×11): qty 0.4

## 2013-08-29 MED ORDER — HYDRALAZINE HCL 20 MG/ML IJ SOLN
5.0000 mg | Freq: Four times a day (QID) | INTRAMUSCULAR | Status: DC | PRN
Start: 2013-08-29 — End: 2013-08-31
  Administered 2013-08-30 – 2013-08-31 (×2): 5 mg via INTRAVENOUS
  Filled 2013-08-29 (×2): qty 1

## 2013-08-29 MED ORDER — LEFLUNOMIDE 20 MG PO TABS
20.0000 mg | ORAL_TABLET | Freq: Every day | ORAL | Status: DC
  Administered 2013-08-30 – 2013-09-08 (×10): 20 mg via ORAL
  Filled 2013-08-29 (×12): qty 1

## 2013-08-29 MED ORDER — ALBUTEROL (5 MG/ML) CONTINUOUS INHALATION SOLN
15.0000 mg/h | INHALATION_SOLUTION | Freq: Once | RESPIRATORY_TRACT | Status: AC
  Administered 2013-08-29: 15 mg/h via RESPIRATORY_TRACT
  Filled 2013-08-29: qty 20

## 2013-08-29 MED ORDER — IPRATROPIUM BROMIDE 0.02 % IN SOLN
0.5000 mg | Freq: Once | RESPIRATORY_TRACT | Status: AC
  Administered 2013-08-29: 0.5 mg via RESPIRATORY_TRACT
  Filled 2013-08-29: qty 2.5

## 2013-08-29 MED ORDER — INSULIN ASPART 100 UNIT/ML ~~LOC~~ SOLN
0.0000 [IU] | Freq: Three times a day (TID) | SUBCUTANEOUS | Status: DC
  Administered 2013-08-30 – 2013-08-31 (×3): 3 [IU] via SUBCUTANEOUS
  Administered 2013-08-31: 2 [IU] via SUBCUTANEOUS
  Administered 2013-08-31: 3 [IU] via SUBCUTANEOUS
  Administered 2013-09-01: 5 [IU] via SUBCUTANEOUS
  Administered 2013-09-01 – 2013-09-02 (×2): 3 [IU] via SUBCUTANEOUS
  Administered 2013-09-02 (×2): 2 [IU] via SUBCUTANEOUS
  Administered 2013-09-03: 3 [IU] via SUBCUTANEOUS
  Administered 2013-09-03: 2 [IU] via SUBCUTANEOUS
  Administered 2013-09-04: 5 [IU] via SUBCUTANEOUS
  Administered 2013-09-04: 2 [IU] via SUBCUTANEOUS
  Administered 2013-09-05: 3 [IU] via SUBCUTANEOUS
  Administered 2013-09-05: 2 [IU] via SUBCUTANEOUS
  Administered 2013-09-06 (×2): 3 [IU] via SUBCUTANEOUS
  Administered 2013-09-07: 2 [IU] via SUBCUTANEOUS
  Administered 2013-09-07: 3 [IU] via SUBCUTANEOUS
  Administered 2013-09-07: 2 [IU] via SUBCUTANEOUS
  Administered 2013-09-08: 3 [IU] via SUBCUTANEOUS

## 2013-08-29 MED ORDER — IOHEXOL 350 MG/ML SOLN
100.0000 mL | Freq: Once | INTRAVENOUS | Status: AC | PRN
  Administered 2013-08-29: 100 mL via INTRAVENOUS

## 2013-08-29 MED ORDER — VANCOMYCIN HCL IN DEXTROSE 1-5 GM/200ML-% IV SOLN
1000.0000 mg | Freq: Three times a day (TID) | INTRAVENOUS | Status: DC
  Administered 2013-08-29 – 2013-08-31 (×5): 1000 mg via INTRAVENOUS
  Filled 2013-08-29 (×6): qty 200

## 2013-08-29 MED ORDER — ALBUTEROL SULFATE (2.5 MG/3ML) 0.083% IN NEBU
2.5000 mg | INHALATION_SOLUTION | RESPIRATORY_TRACT | Status: DC | PRN
  Administered 2013-09-01 – 2013-09-05 (×3): 2.5 mg via RESPIRATORY_TRACT
  Filled 2013-08-29 (×3): qty 3

## 2013-08-29 MED ORDER — METHYLPREDNISOLONE SODIUM SUCC 125 MG IJ SOLR
125.0000 mg | Freq: Once | INTRAMUSCULAR | Status: AC
  Administered 2013-08-29: 125 mg via INTRAVENOUS
  Filled 2013-08-29: qty 2

## 2013-08-29 MED ORDER — FUROSEMIDE 40 MG PO TABS
40.0000 mg | ORAL_TABLET | Freq: Every day | ORAL | Status: DC
  Administered 2013-08-30 – 2013-09-08 (×10): 40 mg via ORAL
  Filled 2013-08-29 (×10): qty 1

## 2013-08-29 MED ORDER — MONTELUKAST SODIUM 10 MG PO TABS
10.0000 mg | ORAL_TABLET | Freq: Every day | ORAL | Status: DC
  Administered 2013-08-30 – 2013-09-07 (×9): 10 mg via ORAL
  Filled 2013-08-29 (×10): qty 1

## 2013-08-29 MED ORDER — ENSURE COMPLETE PO LIQD
237.0000 mL | Freq: Two times a day (BID) | ORAL | Status: DC
  Administered 2013-08-30 – 2013-09-06 (×13): 237 mL via ORAL

## 2013-08-29 MED ORDER — HYDROCOD POLST-CHLORPHEN POLST 10-8 MG/5ML PO LQCR: 5.0000 mL | Freq: Two times a day (BID) | ORAL | Status: DC | PRN

## 2013-08-29 MED ORDER — NYSTATIN 100000 UNIT/GM EX CREA: 1.0000 "application " | TOPICAL_CREAM | Freq: Every day | CUTANEOUS | Status: DC | PRN

## 2013-08-29 MED ORDER — LEVOTHYROXINE SODIUM 50 MCG PO TABS
50.0000 ug | ORAL_TABLET | Freq: Every day | ORAL | Status: DC
  Administered 2013-08-30 – 2013-09-08 (×10): 50 ug via ORAL
  Filled 2013-08-29 (×11): qty 1

## 2013-08-29 MED ORDER — HYDROCODONE-ACETAMINOPHEN 5-325 MG PO TABS
1.0000 | ORAL_TABLET | Freq: Once | ORAL | Status: AC
  Administered 2013-08-29: 1 via ORAL
  Filled 2013-08-29: qty 1

## 2013-08-29 MED ORDER — MOMETASONE FURO-FORMOTEROL FUM 200-5 MCG/ACT IN AERO
2.0000 | INHALATION_SPRAY | Freq: Two times a day (BID) | RESPIRATORY_TRACT | Status: DC
  Administered 2013-08-29 – 2013-09-08 (×20): 2 via RESPIRATORY_TRACT
  Filled 2013-08-29: qty 8.8

## 2013-08-29 MED ORDER — PANTOPRAZOLE SODIUM 40 MG PO TBEC
40.0000 mg | DELAYED_RELEASE_TABLET | Freq: Every day | ORAL | Status: DC
  Administered 2013-08-30 – 2013-09-08 (×10): 40 mg via ORAL
  Filled 2013-08-29 (×11): qty 1

## 2013-08-29 MED ORDER — SODIUM CHLORIDE 0.9 % IV SOLN
500.0000 mg | Freq: Four times a day (QID) | INTRAVENOUS | Status: DC
  Administered 2013-08-29 – 2013-09-08 (×40): 500 mg via INTRAVENOUS
  Filled 2013-08-29 (×46): qty 500

## 2013-08-29 MED ORDER — IPRATROPIUM-ALBUTEROL 0.5-2.5 (3) MG/3ML IN SOLN
3.0000 mL | Freq: Four times a day (QID) | RESPIRATORY_TRACT | Status: DC
  Administered 2013-08-30 – 2013-09-08 (×35): 3 mL via RESPIRATORY_TRACT
  Filled 2013-08-29 (×16): qty 3
  Filled 2013-08-29: qty 6
  Filled 2013-08-29 (×20): qty 3

## 2013-08-29 MED ORDER — TIOTROPIUM BROMIDE MONOHYDRATE 18 MCG IN CAPS: 18.0000 ug | ORAL_CAPSULE | Freq: Every day | RESPIRATORY_TRACT | Status: DC

## 2013-08-29 MED ORDER — POTASSIUM CHLORIDE CRYS ER 20 MEQ PO TBCR
60.0000 meq | EXTENDED_RELEASE_TABLET | Freq: Every day | ORAL | Status: DC
  Administered 2013-08-30 – 2013-09-06 (×8): 60 meq via ORAL
  Filled 2013-08-29 (×10): qty 3

## 2013-08-29 MED ORDER — SODIUM CHLORIDE 0.9 % IV SOLN
Freq: Once | INTRAVENOUS | Status: AC
  Administered 2013-08-29: 21:00:00 via INTRAVENOUS

## 2013-08-29 MED ORDER — HYDROCODONE-ACETAMINOPHEN 5-325 MG PO TABS
1.0000 | ORAL_TABLET | ORAL | Status: DC | PRN
  Administered 2013-08-29 – 2013-09-02 (×14): 1 via ORAL
  Filled 2013-08-29 (×14): qty 1

## 2013-08-29 MED ORDER — SODIUM CHLORIDE 0.9 % IJ SOLN
3.0000 mL | Freq: Two times a day (BID) | INTRAMUSCULAR | Status: DC
  Administered 2013-08-31: 3 mL via INTRAVENOUS
  Administered 2013-08-31: 11:00:00 via INTRAVENOUS
  Administered 2013-08-31 – 2013-09-02 (×5): 3 mL via INTRAVENOUS
  Administered 2013-09-03 (×2): 10 mL via INTRAVENOUS
  Administered 2013-09-04 – 2013-09-07 (×6): 3 mL via INTRAVENOUS

## 2013-08-29 MED ORDER — IPRATROPIUM-ALBUTEROL 0.5-2.5 (3) MG/3ML IN SOLN
3.0000 mL | RESPIRATORY_TRACT | Status: DC
  Administered 2013-08-29: 3 mL via RESPIRATORY_TRACT
  Filled 2013-08-29: qty 3

## 2013-08-29 MED ORDER — SODIUM CHLORIDE 0.9 % IV BOLUS (SEPSIS)
1000.0000 mL | Freq: Once | INTRAVENOUS | Status: AC
  Administered 2013-08-29: 1000 mL via INTRAVENOUS

## 2013-08-29 MED ORDER — LORATADINE 10 MG PO TABS
10.0000 mg | ORAL_TABLET | Freq: Every day | ORAL | Status: DC
  Administered 2013-08-29 – 2013-09-08 (×11): 10 mg via ORAL
  Filled 2013-08-29 (×11): qty 1

## 2013-08-29 MED ORDER — VORICONAZOLE 200 MG PO TABS
200.0000 mg | ORAL_TABLET | Freq: Two times a day (BID) | ORAL | Status: DC
  Administered 2013-08-29 – 2013-08-30 (×2): 200 mg via ORAL
  Filled 2013-08-29 (×3): qty 1

## 2013-08-29 MED ORDER — MORPHINE SULFATE 10 MG/5ML PO SOLN
5.0000 mg | Freq: Four times a day (QID) | ORAL | Status: DC | PRN
  Administered 2013-08-30: 5 mg via ORAL
  Filled 2013-08-29 (×2): qty 5

## 2013-08-29 MED ORDER — SODIUM CHLORIDE 0.9 % IV SOLN
250.0000 mL | INTRAVENOUS | Status: DC | PRN
  Administered 2013-09-02: 250 mL via INTRAVENOUS
  Administered 2013-09-04: 500 mL via INTRAVENOUS

## 2013-08-29 MED ORDER — METHYLPREDNISOLONE SODIUM SUCC 125 MG IJ SOLR
125.0000 mg | Freq: Every day | INTRAMUSCULAR | Status: DC
  Administered 2013-08-29 – 2013-09-02 (×5): 125 mg via INTRAVENOUS
  Filled 2013-08-29 (×5): qty 2

## 2013-08-29 NOTE — Progress Notes (Signed)
ANTIBIOTIC CONSULT NOTE - INITIAL  Pharmacy Consult for Vancomycin, Primaxin Indication: pneumonia  Allergies  Allergen Reactions  . Chantix [Varenicline] Other (See Comments)    Unknown " blurry vision"  . Hctz [Hydrochlorothiazide] Other (See Comments)    unknown  . Penicillins Rash  . Sulfonamide Derivatives Rash  . Tetanus Toxoid Other (See Comments)    Knot on arm    Patient Measurements:   Last documented weight 92kg and height 163 cm (08/25/13)  Vital Signs: Temp: 98.1 F (36.7 C) (03/16 1144) BP: 173/98 mmHg (03/16 1700) Pulse Rate: 116 (03/16 1700)  Labs:  Recent Labs  08/29/13 1308  WBC 10.1  HGB 11.3*  PLT 218  CREATININE 0.80   The CrCl is unknown because both a height and weight (above a minimum accepted value) are required for this calculation.   Microbiology: Recent Results (from the past 720 hour(s))  CULTURE, BLOOD (ROUTINE X 2)     Status: None   Collection Time    08/10/13  8:35 PM      Result Value Ref Range Status   Specimen Description BLOOD LEFT WRIST   Final   Special Requests BOTTLES DRAWN AEROBIC AND ANAEROBIC 2.5ML   Final   Culture  Setup Time     Final   Value: 08/11/2013 01:18     Performed at [facility]   Culture     Final   Value: NO GROWTH 5 DAYS     Performed at [facility]   Report Status 08/17/2013 FINAL   Final  MRSA PCR SCREENING     Status: Abnormal   Collection Time    08/10/13 11:40 PM      Result Value Ref Range Status   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 It is not     intended to diagnose MRSA     infection nor to guide or     monitor treatment for     MRSA infections.     RESULT CALLED TO, READ BACK BY AND VERIFIED WITH:     SPOKE WITH AYERS,C RN [phone] [place],N  CULTURE, BLOOD (ROUTINE X 2)      Status: None   Collection Time    08/10/13 11:45 PM      Result Value Ref Range Status   Specimen Description BLOOD LEFT HAND   Final   Special Requests BOTTLES DRAWN AEROBIC AND ANAEROBIC 5CC   Final   Culture  Setup Time     Final   Value: 08/11/2013 04:06     Performed at [facility]   Culture     Final   Value: NO GROWTH 5 DAYS     Performed at [facility]   Report Status 08/17/2013 FINAL   Final  FUNGUS CULTURE W SMEAR     Status: None   Collection Time    08/15/13  6:00 PM      Result Value Ref Range Status   Specimen Description SPUTUM   Final   Special Requests Immunocompromised   Final   Fungal Smear     Final   Value: NO YEAST OR FUNGAL ELEMENTS SEEN     Performed at [facility]   Culture     Final   Value: CANDIDA ALBICANS     Performed at [facility]   Report Status PENDING   Incomplete  CULTURE, EXPECTORATED SPUTUM-ASSESSMENT     Status: None   Collection Time    08/15/13  6:00 PM      Result Value Ref Range Status   Specimen Description SPUTUM   Final   Special Requests NONE   Final   Sputum evaluation     Final   Value: THIS SPECIMEN IS ACCEPTABLE. RESPIRATORY CULTURE REPORT TO FOLLOW.   Report Status 08/15/2013 FINAL   Final  CULTURE, RESPIRATORY (NON-EXPECTORATED)     Status: None   Collection Time    08/15/13  6:00 PM      Result Value Ref Range Status   Specimen Description SPUTUM   Final   Special Requests NONE   Final   Gram Stain     Final   Value: FEW WBC PRESENT,BOTH PMN AND MONONUCLEAR     RARE SQUAMOUS EPITHELIAL CELLS PRESENT     FEW GRAM POSITIVE COCCI IN PAIRS     FEW GRAM NEGATIVE RODS     Performed at [facility]   Culture     Final   Value: NORMAL OROPHARYNGEAL FLORA     Performed at [facility]   Report Status 08/18/2013 FINAL   Final    Medical History: Past Medical History  Diagnosis Date  . Asthma   . Emphysema     02 dependent 3L/min  . Emphysema   . Pneumonia   .  Hypothyroidism   . Diabetes mellitus, type II   . Obesity   . Diastolic heart failure 09/22/2012    Grade 1  . Emphysema   . Rheumatoid arthritis(714.0)   . Hyperlipidemia   . Hypertension   . Vitamin D deficiency     Medications:  Anti-infectives   None     Assessment: 12 yoF [facility] ED with abdominal and chest pain and SOB.  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  Pharmacy is consulted to dose vancomycin and Primaxin.  Tmax: 98.1  WBCs: 10.1  Renal: SCr 0.8, CrCl > 100 ml/min (wt 92 kg, CrCl ~83 ml/min Normalized)  Antibiotics on previous admission (2/25 - 3/3 ) included vanc/aztreonam transitioned to levofloxacin and voriconazole was added for r/o pulmonary Aspergillosis.  2/25 Aspergillus galactomannan Ag not detected.  Goal of Therapy:  Vancomycin trough level 15-20 mcg/ml  Plan:   Primaxin 500mg  IV q6h  Vancomycin 1000 mg IV q8h.  Measure Vanc trough at steady state.  Follow up renal fxn and culture results.   PharmD, BCPS Pager [phone] 08/29/2013 6:56 PM

## 2013-08-29 NOTE — Progress Notes (Signed)
Subjective:    Patient ID: Jacqueline Washington, female    DOB: March 17, 1951, 63 y.o.   MRN: [id number]  HPI 63 y.o. female morbidly obese with endstage COPD presents for chest pain. Thursday or Friday she started to have chest pain while lying in her chair, started out feeling like a belt across her chest, but has progressed to sharp pain that  radiates from right upper quadrant to center chest. Accompanied by worsening breathing, productive cough with white mucus, dizziness, and diaphoresis. Pain is worse with sitting up straight and better with leaning forward, better with belching.  Normally takes morphine/norco 10 to help sleep and help pain, took pain meds at 1030 this morning and this has not helped the pain. Denies pleurisy, vomiting, nausea.  She states she has had constipation for one week, on colace. In addition she states she has polydypsia and has had decreased urine output even with her lasix.   States she has been having right shoulder pain, contributes this to playing with the mouse on her computer.   Current Outpatient Prescriptions on File Prior to Visit  Medication Sig Dispense Refill  . albuterol (PROVENTIL) (2.5 MG/3ML) 0.083% nebulizer solution Take 3 mLs (2.5 mg total) by nebulization 4 (four) times daily. DX 496  360 mL  6  . allopurinol (ZYLOPRIM) 100 MG tablet Take 100 mg by mouth daily.      Marland Kitchen ALPRAZolam (XANAX) 0.5 MG tablet Take 1 tablet (0.5 mg total) by mouth 3 (three) times daily as needed for anxiety.  30 tablet  0  . cetirizine (ZYRTEC) 10 MG tablet Take 10 mg by mouth daily.      . feeding supplement, ENSURE COMPLETE, (ENSURE COMPLETE) LIQD Take 237 mLs by mouth 2 (two) times daily between meals.  10 Bottle  0  . Fluticasone-Salmeterol (ADVAIR) 500-50 MCG/DOSE AEPB Inhale 1 puff into the lungs 2 (two) times daily.      . furosemide (LASIX) 40 MG tablet Take 40 mg by mouth daily.       Marland Kitchen ipratropium (ATROVENT) 0.02 % nebulizer solution Take 2.5 mLs (0.5 mg total) by  nebulization every 6 (six) hours as needed for wheezing or shortness of breath. Dx 496  300 mL  12  . leflunomide (ARAVA) 20 MG tablet Take 20 mg by mouth daily.      Marland Kitchen levothyroxine (SYNTHROID, LEVOTHROID) 50 MCG tablet TAKE 1 TABLET EVERY DAY  90 tablet  1  . montelukast (SINGULAIR) 10 MG tablet Take 1 tablet by mouth daily.      Marland Kitchen morphine 10 MG/5ML solution Take 2.5 mLs (5 mg total) by mouth every 6 (six) hours as needed for severe pain (Shortness of breath).  120 mL  0  . nystatin cream (MYCOSTATIN) Apply 1 application topically daily as needed (under breasts).      . pantoprazole (PROTONIX) 40 MG tablet Take 1 tablet (40 mg total) by mouth daily.  30 tablet  6  . potassium chloride SA (K-DUR,KLOR-CON) 20 MEQ tablet Take 60 mEq by mouth daily.      . predniSONE (DELTASONE) 20 MG tablet Reduce to 35 mg daily for 7days then reduce to 30mg  per day  for seven days, then reduce to 25 mg po daily for seven days and then reduce to 20 mg po daily and stay on 20 mg po daily (use 20mg  and 5mg  pred tabs)  60 tablet  1  . SPIRIVA HANDIHALER 18 MCG inhalation capsule INHALE 1 PUFF DAILY  30  capsule  3  . voriconazole (VFEND) 200 MG tablet Take 1 tablet (200 mg total) by mouth every 12 (twelve) hours.  60 tablet  2  . white petrolatum (VASELINE) GEL Apply 1 application topically as needed for lip care.  28.35 g  4   No current facility-administered medications on file prior to visit.   Past Medical History  Diagnosis Date  . Asthma   . Emphysema     02 dependent 3L/min  . Emphysema   . Pneumonia   . Hypothyroidism   . Diabetes mellitus, type II   . Obesity   . Diastolic heart failure 09/22/2012    Grade 1  . Emphysema   . Rheumatoid arthritis(714.0)   . Hyperlipidemia   . Hypertension   . Vitamin D deficiency      Review of Systems  Constitutional: Positive for diaphoresis and fatigue. Negative for fever and chills.  HENT: Negative.   Respiratory: Positive for cough, chest tightness,  shortness of breath and wheezing.   Cardiovascular: Positive for chest pain, palpitations and leg swelling.  Gastrointestinal: Positive for abdominal pain, constipation and abdominal distention. Negative for nausea, vomiting, diarrhea, blood in stool, anal bleeding and rectal pain.  Endocrine: Negative.   Genitourinary: Positive for decreased urine volume. Negative for dysuria, urgency, frequency, hematuria and flank pain.  Musculoskeletal: Negative.   Neurological: Positive for dizziness. Negative for syncope, facial asymmetry, speech difficulty, light-headedness, numbness and headaches.       Objective:   Physical Exam  Constitutional: She is oriented to person, place, and time.  HENT:  Head: Normocephalic and atraumatic.  Eyes: Conjunctivae are normal. Pupils are equal, round, and reactive to light.  Neck: Normal range of motion. Neck supple. No JVD present.  Cardiovascular: Tachycardia present.  Exam reveals decreased pulses.   Pulmonary/Chest: Tachypnea noted. She has decreased breath sounds. She has wheezes.  Abdominal: She exhibits distension. Bowel sounds are decreased. There is tenderness in the right upper quadrant and epigastric area. There is rebound and guarding. There is no CVA tenderness.  Musculoskeletal:  Wheelchair  Neurological: She is alert and oriented to person, place, and time.  Skin: Skin is warm. She is diaphoretic.      Assessment & Plan:  Chest pain with SOB,diaphoresis- EKG shows tachycardia but no ST changes- go to ER to rule out PE, CHF, MI, Pericarditis, recent severe anemia with transfusion last hospitalization.  Abdominal pain, constipation, pain not responding to morphine- Rule out gallstones, pancreatitis, obstruction  Patient is suppose to be getting a portacath on Thursday, difficult stick Have discussed hospice with the patient and family on multiple occasions and still encourage this.  Sent to [facility] per private vehicle, refused ambulance.

## 2013-08-29 NOTE — ED Notes (Signed)
Multiple attempts at blood cultures by this RN and ER phlebotomist unsuccessful.

## 2013-08-29 NOTE — ED Notes (Signed)
Lab called for assistance in obtaining both blood cultures. Will be up shortly.

## 2013-08-29 NOTE — ED Notes (Signed)
Pt presents to ed with c/o upper abdominal pain and central chest pain since Saturday. Pt also reports increased SOB, uses home O2 at 6LPM

## 2013-08-29 NOTE — ED Provider Notes (Signed)
Patient signed out to me to followup on CT angiography. Patient with a history of COPD and recent hospitalization for healthcare associated pneumonia returns with worsening symptoms. She has had increased cough for 3 days. She was seen by primary care doctor and had an increased oxygen demand today, referred to the ER.  CT scan did not show any evidence of pulmonary embolism. She has persistent pneumonia similar to imaging performed during her hospitalization 2 weeks ago. At that time she was treated with Levaquin and antifungals for what was felt to be Aspergillus pneumonia. Reviewing her records, however, reveals that the Aspergillus titer was negative. I am concerned that she has persistent bacterial pneumonia and initiated broad-spectrum coverage for healthcare associated pneumonia. In conjunction with pharmacy, Primaxin and vancomycin was chosen. Patient will require hospitalization.  Jacqueline Crease, MD 08/29/13 [place]

## 2013-08-29 NOTE — H&P (Signed)
[facility] History and Physical  Jacqueline Washington KCM:[id number] DOB: 1950/09/27 DOA: 08/29/2013  Referring physician:  PCP: Nadean Corwin, MD  Specialists: Dr. Delford Field, pulmonologist  Chief Complaint: Shortness of breath and cough  HPI: Jacqueline Washington is a 63 y.o. female  With history of COPD, recent hospitalization and discharged on 08/16/2013 for suspected aspergillosis versus pneumonia, rheumatoid arthritis that presented to the emergency department with complaints of shortness of breath and cough. Patient also complained of some right-sided rib pain which was worse with inspiration as well as coughing. Patient normally uses approximately 4-6 L of oxygen via nasal cannula at home, however today she's required approximately 8 L. Patient had gone to her primary care physician's office and was sent to the emergency department for further evaluation. Patient denies any recent fever, chills, sick contacts, diarrhea.  Review of Systems:  Constitutional: Denies fever, chills, diaphoresis. Complains of generalized fatigue and weakness. HEENT: Denies photophobia, eye pain, redness, hearing loss, ear pain, congestion, sore throat, rhinorrhea, sneezing, mouth sores, trouble swallowing, neck pain, neck stiffness and tinnitus.   Respiratory: Complains of cough and shortness of breath.   Cardiovascular: Denies chest pain, palpitations and leg swelling.  Gastrointestinal: Complains of constipation. Denies any abdominal pain nausea or vomiting.  Genitourinary: Denies dysuria, urgency, frequency, hematuria, flank pain and difficulty urinating.  Musculoskeletal: Denies myalgias, back pain, joint swelling, arthralgias and gait problem.  Skin: Denies pallor, rash and wound.  Neurological: Denies dizziness, seizures, syncope, weakness, light-headedness, numbness and headaches.  Hematological: Denies adenopathy. Easy bruising, personal or family bleeding history  Psychiatric/Behavioral: Denies  suicidal ideation, mood changes, confusion, nervousness, sleep disturbance and agitation  Past Medical History  Diagnosis Date  . Asthma   . Emphysema     02 dependent 3L/min  . Emphysema   . Pneumonia   . Hypothyroidism   . Diabetes mellitus, type II   . Obesity   . Diastolic heart failure 09/22/2012    Grade 1  . Emphysema   . Rheumatoid arthritis(714.0)   . Hyperlipidemia   . Hypertension   . Vitamin D deficiency    Past Surgical History  Procedure Laterality Date  . Tubal ligation    . Colonoscopy    . Video bronchoscopy N/A 11/18/2012    Procedure: VIDEO BRONCHOSCOPY WITH FLUORO;  Surgeon: Storm Frisk, MD;  Location: [facility];  Service: Cardiopulmonary;  Laterality: N/A;  . Eye surgery  at age 18   Social History:  reports that she quit smoking about 6 years ago. Her smoking use included Cigarettes. She has a 129 pack-year smoking history. She has never used smokeless tobacco. She reports that she drinks alcohol. She reports that she does not use illicit drugs.   Allergies  Allergen Reactions  . Chantix [Varenicline] Other (See Comments)    Unknown " blurry vision"  . Hctz [Hydrochlorothiazide] Other (See Comments)    unknown  . Penicillins Rash  . Sulfonamide Derivatives Rash  . Tetanus Toxoid Other (See Comments)    Knot on arm    Family History  Problem Relation Age of Onset  . Asthma Mother   . Heart disease Mother   . Clotting disorder Mother   . Heart attack Mother   . Diabetes Mother   . Hypertension Mother   . Cirrhosis Mother   . Colon cancer Neg Hx   . Heart attack Father     Prior to Admission medications   Medication Sig Start Date End Date Taking? Authorizing Provider  albuterol (  PROVENTIL) (2.5 MG/3ML) 0.083% nebulizer solution Take 3 mLs (2.5 mg total) by nebulization 4 (four) times daily. DX 496 06/23/13  Yes Storm Frisk, MD  allopurinol (ZYLOPRIM) 100 MG tablet Take 100 mg by mouth daily.   Yes Historical Provider, MD    cetirizine (ZYRTEC) 10 MG tablet Take 10 mg by mouth daily.   Yes Historical Provider, MD  feeding supplement, ENSURE COMPLETE, (ENSURE COMPLETE) LIQD Take 237 mLs by mouth 2 (two) times daily between meals. 08/16/13  Yes Meredeth Ide, MD  Fluticasone-Salmeterol (ADVAIR) 500-50 MCG/DOSE AEPB Inhale 1 puff into the lungs 2 (two) times daily.   Yes Historical Provider, MD  furosemide (LASIX) 40 MG tablet Take 40 mg by mouth daily.    Yes Historical Provider, MD  HYDROcodone-acetaminophen (NORCO/VICODIN) 5-325 MG per tablet Take 1 tablet by mouth every 4 (four) hours as needed. 07/07/13  Yes Historical Provider, MD  ipratropium (ATROVENT) 0.02 % nebulizer solution Take 2.5 mLs (0.5 mg total) by nebulization every 6 (six) hours as needed for wheezing or shortness of breath. Dx 496 04/19/13  Yes Simonne Martinet, NP  leflunomide (ARAVA) 20 MG tablet Take 20 mg by mouth daily. 06/06/13  Yes Historical Provider, MD  levothyroxine (SYNTHROID, LEVOTHROID) 50 MCG tablet TAKE 1 TABLET EVERY DAY 08/12/13  Yes Melissa R Smith, PA-C  montelukast (SINGULAIR) 10 MG tablet Take 1 tablet by mouth daily.   Yes Historical Provider, MD  morphine 10 MG/5ML solution Take 2.5 mLs (5 mg total) by mouth every 6 (six) hours as needed for severe pain (Shortness of breath). 08/16/13  Yes Meredeth Ide, MD  nystatin cream (MYCOSTATIN) Apply 1 application topically daily as needed (under breasts).   Yes Historical Provider, MD  pantoprazole (PROTONIX) 40 MG tablet Take 1 tablet (40 mg total) by mouth daily. 04/19/13  Yes Simonne Martinet, NP  potassium chloride SA (K-DUR,KLOR-CON) 20 MEQ tablet Take 60 mEq by mouth daily. 10/26/12  Yes Laveda Norman, MD  predniSONE (DELTASONE) 20 MG tablet Reduce to 35 mg daily for 7days then reduce to 30mg  per day  for seven days, then reduce to 25 mg po daily for seven days and then reduce to 20 mg po daily and stay on 20 mg po daily (use 20mg  and 5mg  pred tabs) 08/16/13  Yes Meredeth Ide, MD  SPIRIVA  HANDIHALER 18 MCG inhalation capsule INHALE 1 PUFF DAILY 08/25/13  Yes Melissa R Smith, PA-C  voriconazole (VFEND) 200 MG tablet Take 1 tablet (200 mg total) by mouth every 12 (twelve) hours. 08/16/13  Yes Meredeth Ide, MD  white petrolatum (VASELINE) GEL Apply 1 application topically as needed for lip care. 08/23/13  Yes Quentin Mulling, PA-C   Physical Exam: Filed Vitals:   08/29/13 1845  BP: 166/92  Pulse: 119  Resp: 28     General: Well developed, well nourished, mild respiratory distress, appears stated age  HEENT: NCAT, PERRLA, EOMI, Anicteic Sclera, mucous membranes dry  Neck: Supple, no JVD, no masses  Cardiovascular: S1 S2 auscultated, tachycardic  Respiratory: Diminished breath sounds, with some expiratory wheezing noted.  Abdomen: Soft, nontender, nondistended, + bowel sounds  Extremities: warm dry without cyanosis clubbing. +1 edema in lower extremities  Neuro: AAOx3, cranial nerves grossly intact. Strength 5/5 in patient's upper and lower extremities bilaterally  Skin: Without rashes exudates or nodules  Psych: Anxious, intact judgment and insight.  Labs on Admission:  Basic Metabolic Panel:  Recent Labs Lab 08/23/13 1213 08/29/13 1308  NA 139 127*  K 3.9 4.8  CL 99 82*  CO2 29 31  GLUCOSE 159* 173*  BUN 14 18  CREATININE 0.81 0.80  CALCIUM 8.5 9.8  MG 1.8  --    Liver Function Tests:  Recent Labs Lab 08/23/13 1213 08/29/13 1308  AST 19 36  ALT 16 33  ALKPHOS 78 108  BILITOT 0.4 0.5  PROT 6.2 7.4  ALBUMIN 3.3* 3.4*    Recent Labs Lab 08/29/13 1308  LIPASE 30   No results found for this basename: AMMONIA,  in the last 168 hours CBC:  Recent Labs Lab 08/23/13 1213 08/29/13 1308  WBC 10.6* 10.1  NEUTROABS 9.4*  --   HGB 10.0* 11.3*  HCT 32.9* 37.9  MCV 75.6* 78.3  PLT 260 218   Cardiac Enzymes: No results found for this basename: CKTOTAL, CKMB, CKMBINDEX, TROPONINI,  in the last 168 hours  BNP (last 3 results)  Recent Labs   03/25/13 0601 04/14/13 2300 08/10/13 1554  PROBNP 1150.0* 301.6* 1099.0*   CBG: No results found for this basename: GLUCAP,  in the last 168 hours  Radiological Exams on Admission: Dg Abd 1 View  08/29/2013   CLINICAL DATA:  Abdominal pain, constipation  EXAM: ABDOMEN - 1 VIEW  COMPARISON:  None.  FINDINGS: There is nonspecific nonobstructive bowel gas pattern. Moderate stool noted in right colon. Some gas noted in transverse colon.  IMPRESSION: Nonspecific nonobstructive bowel gas pattern. Moderate stool noted in right colon.   Electronically Signed   By: Natasha Mead M.D.   On: 08/29/2013 15:51   Ct Angio Chest W/cm &/or Wo Cm  08/29/2013   CLINICAL DATA:  COPD and pneumonia  EXAM: CT ANGIOGRAPHY CHEST WITH CONTRAST  TECHNIQUE: Multidetector CT imaging of the chest was performed using the standard protocol during bolus administration of intravenous contrast. Multiplanar CT image reconstructions and MIPs were obtained to evaluate the vascular anatomy.  CONTRAST:  OMNIPAQUE IOHEXOL 350 MG/ML SOLN  COMPARISON:  DG CHEST 1V PORT dated 08/29/2013; CT CHEST W/O CM dated 08/10/2013; DG CHEST 2 VIEW dated 04/29/2013; CT CHEST W/CM dated 10/19/2012  FINDINGS: There are no filling defects in the pulmonary arterial tree to suggest acute pulmonary thromboembolism.  No evidence of aortic dissection. Maximal ascending aortic diameter is 3.7 cm. Atherosclerotic changes at the origin of the left common carotid artery and left subclavian artery are noted. Left ventricular hypertrophy is present.  No significant pericardial effusion is present. Stable coronary artery calcifications. Small mediastinal nodes are stable.  Small left pleural effusion is stable and is probably loculated.  Fibrotic lung disease within apical prep collection is stable. Scattered patchy opacities in the lower lungs are also stable. Confluent opacity in the superior segment of the left lower lobe is also stable. Severe emphysema.  No  pneumothorax.  No acute bony deformity.  Round soft tissue density in the left breast is stable.  Review of the MIP images confirms the above findings.  IMPRESSION: No evidence of acute pulmonary thromboembolism.  Chronic lung disease.  Stable confluent pulmonary opacity in the superior segment of the left lower lobe previously characterized as pneumonia.   Electronically Signed   By: Maryclare Bean M.D.   On: 08/29/2013 17:16   Dg Chest Port 1 View  08/29/2013   CLINICAL DATA:  Shortness of breath, asthma, emphysema  EXAM: PORTABLE CHEST - 1 VIEW  COMPARISON:  08/25/2013 and 08/13/2013  FINDINGS: Cardiomediastinal silhouette is stable. Extensive scarring and volume loss bilateral  upper lobe again noted. Stable emphysematous changes. Again noted hilar retraction bilaterally. No definite new consolidation is noted. Stable airspace opacities in upper lobes.  IMPRESSION: Extensive scarring and volume loss bilateral upper lobe again noted. Stable emphysematous changes. Again noted hilar retraction bilaterally. No definite new consolidation is noted. Stable airspace opacities in upper lobes.   Electronically Signed   By: Natasha Mead M.D.   On: 08/29/2013 13:23    EKG: Independently reviewed. Sinus tachycardia rate 135.  Assessment/Plan  Acute respiratory failure likely secondary to HCAP vs aspergillus Patient will be admitted to step down unit. Will place her on broad-spectrum antibiotics with vancomycin and Primaxin, as patient has a penicillin allergy. 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 She was recently admitted and discharged on February 25 2 08/16/2013. 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 CT of the chest shows no evidence of acute pulmonary embolism, chronic lung disease, stable confluent pulmonary opacity in the superior segment left lower lobe previously characterized as pneumonia. Patient has had worsening shortness of breath. Will place her on nasal cannula to maintain  oxygen saturations above 92%. Will also place on DuoNeb treatments as well as Solu-Medrol and continue Advair and spiriva.  Pulmonology as well as infectious disease have been consulted.  COPD exacerbation Secondary to the above. Treatment and plan as above.  Grade 1 diastolic dysfunction Will place on fluid restriction, monitor daily weights and intake and output.  Diabetes mellitus type 2 Appears to be diet controlled, last hemoglobin A1c was on 08/23/2013 6.4. Will place her on insulin sliding scale due to Solu-Medrol again monitor her CBGs.  Hypertension Continue Lasix, will add on medications.    Hypothyroidism Continue her thyroxine  Gout Continue allopurinol.   GERD Continue PPI.   Rheumatoid arthritis Continue Arava and pain control as needed.   Physical deconditioning Will consult PT for evaluation and treatment.  DVT prophylaxis: Lovenox  Code Status: DO NOT RESUSCITATE  Condition: Guarded  Family Communication: Daughter at bedside. Admission, patients condition and plan of care including tests being ordered have been discussed with the patient and daughter who indicate understanding and agree with the plan and Code Status.  Disposition Plan: Admitted.    Time spent: 60 minutes  Whisper Kurka D.O. [facility] Pager [phone]  If 7PM-7AM, please contact night-coverage [email or web address] Password [email or web address] 08/29/2013, 8:01 PM

## 2013-08-29 NOTE — Progress Notes (Signed)
   CARE MANAGEMENT ED NOTE 08/29/2013  Patient:  Jacqueline Washington, Jacqueline Washington   Account Number:  [id number]  Date Initiated:  08/29/2013  Documentation initiated by:  Radford Pax  Subjective/Objective Assessment:   Patient presents to Ed with chest pain and shortness of breath     Subjective/Objective Assessment Detail:   Patient with past medical history of COPD, asthma, DM and recent pneumonia.  Currently with elevated heart rate of 100-139     Action/Plan:   Action/Plan Detail:   Anticipated DC Date:       Status Recommendation to Physician:   Result of Recommendation:    Other ED Services  Consult Working Plan    DC Planning Services  Other  Medication Assistance    Choice offered to / List presented to:            Status of service:  Completed, signed off  ED Comments:   ED Comments Detail:  EDCM spoke to patient and [name] at bedside Sam's phone number [phone].  Patient lives with her husband at home.  Patient is currently receiving home health services with [facility].  Patient has a visiting RN, PT, OT and receives her oxygen from [facility]. [facility] text Belenda Cruise, transitional care specialist for [facility] to update [facility] of patient's admission.  Patient has a walker, wheelchair, transport chair, nebulizer machine and bedside commode at home.  Patient is also being followed by [facility]. Patient having difficuly affording Vfend and Spiriva. Patient has [employer]/Medicare insurance.  EDCM provided patient's daughter printed discount card from needymeds.org to save 80% on medication costs.  Also provided patient with phone number for patient assistance program for Vfend and spiriva.  [facility] provided patient with website needymeds.org and Good https://figueroa.info/.  Also provided patient with list of financial resources in the community such as local churches and Holiday representative.  Patient and patient's daughter thankful for services.  No further EDCM needs at this time.

## 2013-08-29 NOTE — ED Notes (Signed)
Lab at bs to attempt for cultures.

## 2013-08-29 NOTE — ED Provider Notes (Signed)
CSN: [id number]     Arrival date & time 08/29/13  1232 History   First MD Initiated Contact with Patient 08/29/13 1248     Chief Complaint  Patient presents with  . Chest Pain     (Consider location/radiation/quality/duration/timing/severity/associated sxs/prior Treatment) HPI  This a 63 year old female with history of asthma, COPD, recent pneumonia, and diabetes who presents with chest pain and shortness of breath. Patient had recent hospitalization for health care acquired pneumonia. Patient reports sharp right sided and sternal chest pain that is worse with inspiration and worse with coughing. She states onset of symptoms happened Thursday. Patient also reports to 3 day history of worsening shortness of breath. She has a chronic productive cough. She's currently on 6 L of home O2. She denies any fevers or chills. Patient did not take any good meds prior to arrival. She denies any lower extremity swelling. She denies a history of blood clots.  Past Medical History  Diagnosis Date  . Asthma   . Emphysema     02 dependent 3L/min  . Emphysema   . Pneumonia   . Hypothyroidism   . Diabetes mellitus, type II   . Obesity   . Diastolic heart failure 09/22/2012    Grade 1  . Emphysema   . Rheumatoid arthritis(714.0)   . Hyperlipidemia   . Hypertension   . Vitamin D deficiency    Past Surgical History  Procedure Laterality Date  . Tubal ligation    . Colonoscopy    . Video bronchoscopy N/A 11/18/2012    Procedure: VIDEO BRONCHOSCOPY WITH FLUORO;  Surgeon: Storm Frisk, MD;  Location: [facility];  Service: Cardiopulmonary;  Laterality: N/A;  . Eye surgery  at age 4   Family History  Problem Relation Age of Onset  . Asthma Mother   . Heart disease Mother   . Clotting disorder Mother   . Heart attack Mother   . Diabetes Mother   . Hypertension Mother   . Cirrhosis Mother   . Colon cancer Neg Hx   . Heart attack Father    History  Substance Use Topics  . Smoking status:  Former Smoker -- 3.00 packs/day for 43 years    Types: Cigarettes    Quit date: 04/24/2007  . Smokeless tobacco: Never Used  . Alcohol Use: Yes     Comment: occasional   OB History   Grav Para Term Preterm Abortions TAB SAB Ect Mult Living                 Review of Systems  Constitutional: Negative for fever and chills.  Respiratory: Positive for cough and shortness of breath. Negative for chest tightness.   Cardiovascular: Positive for chest pain.  Gastrointestinal: Positive for constipation. Negative for nausea, vomiting and abdominal pain.  Genitourinary: Negative for dysuria.  Musculoskeletal: Negative for back pain.  Skin: Negative for rash.  Neurological: Negative for headaches.  Psychiatric/Behavioral: Negative for confusion.  All other systems reviewed and are negative.      Allergies  Chantix; Hctz; Penicillins; Sulfonamide derivatives; and Tetanus toxoid  Home Medications   Current Outpatient Rx  Name  Route  Sig  Dispense  Refill  . albuterol (PROVENTIL) (2.5 MG/3ML) 0.083% nebulizer solution   Nebulization   Take 3 mLs (2.5 mg total) by nebulization 4 (four) times daily. DX 496   360 mL   6   . allopurinol (ZYLOPRIM) 100 MG tablet   Oral   Take 100 mg by mouth daily.         Marland Kitchen  cetirizine (ZYRTEC) 10 MG tablet   Oral   Take 10 mg by mouth daily.         . feeding supplement, ENSURE COMPLETE, (ENSURE COMPLETE) LIQD   Oral   Take 237 mLs by mouth 2 (two) times daily between meals.   10 Bottle   0   . Fluticasone-Salmeterol (ADVAIR) 500-50 MCG/DOSE AEPB   Inhalation   Inhale 1 puff into the lungs 2 (two) times daily.         . furosemide (LASIX) 40 MG tablet   Oral   Take 40 mg by mouth daily.          Marland Kitchen HYDROcodone-acetaminophen (NORCO/VICODIN) 5-325 MG per tablet   Oral   Take 1 tablet by mouth every 4 (four) hours as needed.         Marland Kitchen ipratropium (ATROVENT) 0.02 % nebulizer solution   Nebulization   Take 2.5 mLs (0.5 mg total)  by nebulization every 6 (six) hours as needed for wheezing or shortness of breath. Dx 496   300 mL   12   . leflunomide (ARAVA) 20 MG tablet   Oral   Take 20 mg by mouth daily.         Marland Kitchen levothyroxine (SYNTHROID, LEVOTHROID) 50 MCG tablet      TAKE 1 TABLET EVERY DAY   90 tablet   1   . montelukast (SINGULAIR) 10 MG tablet   Oral   Take 1 tablet by mouth daily.         Marland Kitchen morphine 10 MG/5ML solution   Oral   Take 2.5 mLs (5 mg total) by mouth every 6 (six) hours as needed for severe pain (Shortness of breath).   120 mL   0   . nystatin cream (MYCOSTATIN)   Topical   Apply 1 application topically daily as needed (under breasts).         . pantoprazole (PROTONIX) 40 MG tablet   Oral   Take 1 tablet (40 mg total) by mouth daily.   30 tablet   6   . potassium chloride SA (K-DUR,KLOR-CON) 20 MEQ tablet   Oral   Take 60 mEq by mouth daily.         . predniSONE (DELTASONE) 20 MG tablet      Reduce to 35 mg daily for 7days then reduce to 30mg  per day  for seven days, then reduce to 25 mg po daily for seven days and then reduce to 20 mg po daily and stay on 20 mg po daily (use 20mg  and 5mg  pred tabs)   60 tablet   1   . SPIRIVA HANDIHALER 18 MCG inhalation capsule      INHALE 1 PUFF DAILY   30 capsule   3   . voriconazole (VFEND) 200 MG tablet   Oral   Take 1 tablet (200 mg total) by mouth every 12 (twelve) hours.   60 tablet   2   . white petrolatum (VASELINE) GEL   Topical   Apply 1 application topically as needed for lip care.   28.35 g   4    BP 155/94  Pulse 124  Resp 24  SpO2 100% Physical Exam  Nursing note and vitals reviewed. Constitutional: She is oriented to person, place, and time.  Obese, tachypneic, chronically ill-appearing  HENT:  Head: Normocephalic and atraumatic.  Mucous membranes dry  Eyes: Pupils are equal, round, and reactive to light.  Cardiovascular: Regular rhythm and normal heart sounds.  Tachycardia   Pulmonary/Chest: Effort normal. No respiratory distress. She has wheezes.  tachypnic Nasal cannula in place  Abdominal: Soft. Bowel sounds are normal. She exhibits no distension. There is no tenderness. There is no rebound and no guarding.  Musculoskeletal: She exhibits edema.  Neurological: She is alert and oriented to person, place, and time.  Skin: Skin is warm and dry.  Psychiatric: She has a normal mood and affect.    ED Course  Procedures (including critical care time) Labs Review Labs Reviewed  CBC - Abnormal; Notable for the following:    Hemoglobin 11.3 (*)    MCH 23.3 (*)    MCHC 29.8 (*)    RDW 21.7 (*)    All other components within normal limits  COMPREHENSIVE METABOLIC PANEL - Abnormal; Notable for the following:    Sodium 127 (*)    Chloride 82 (*)    Glucose, Bld 173 (*)    Albumin 3.4 (*)    GFR calc non Af Amer 77 (*)    GFR calc Af Amer 90 (*)    All other components within normal limits  LIPASE, BLOOD  I-STAT TROPOININ, ED   Imaging Review Dg Abd 1 View  08/29/2013   CLINICAL DATA:  Abdominal pain, constipation  EXAM: ABDOMEN - 1 VIEW  COMPARISON:  None.  FINDINGS: There is nonspecific nonobstructive bowel gas pattern. Moderate stool noted in right colon. Some gas noted in transverse colon.  IMPRESSION: Nonspecific nonobstructive bowel gas pattern. Moderate stool noted in right colon.   Electronically Signed   By: Natasha Mead M.D.   On: 08/29/2013 15:51   Dg Chest Port 1 View  08/29/2013   CLINICAL DATA:  Shortness of breath, asthma, emphysema  EXAM: PORTABLE CHEST - 1 VIEW  COMPARISON:  08/25/2013 and 08/13/2013  FINDINGS: Cardiomediastinal silhouette is stable. Extensive scarring and volume loss bilateral upper lobe again noted. Stable emphysematous changes. Again noted hilar retraction bilaterally. No definite new consolidation is noted. Stable airspace opacities in upper lobes.  IMPRESSION: Extensive scarring and volume loss bilateral upper lobe again  noted. Stable emphysematous changes. Again noted hilar retraction bilaterally. No definite new consolidation is noted. Stable airspace opacities in upper lobes.   Electronically Signed   By: Natasha Mead M.D.   On: 08/29/2013 13:23     EKG Interpretation   Date/Time:  Monday August 29 2013 12:43:39 EDT Ventricular Rate:  135 PR Interval:  135 QRS Duration: 66 QT Interval:  290 QTC Calculation: 435 R Axis:   73 Text Interpretation:  Sinus tachycardia LAE, consider biatrial enlargement  Minimal ST depression, diffuse leads No significant change since last  tracing Confirmed by HORTON  MD, Toni Amend ([id number]) on 08/29/2013 12:52:26 PM      MDM   Final diagnoses:  None    Patient presents with shortness of breath and chest pain. His chronically ill-appearing on exam. Patient has wheezing. Vital signs notable for pulse of 130 and BP 102/60.  Lab work shows evidence of mild dehydration.  Patient does not have any history of blood clots but has a recent hospitalization. Have ordered CTA. Patient also received duo neb and steroids for presumed COPD component.  SIgned out to Pollina.    Shon Baton, MD 08/29/13 [phone]

## 2013-08-29 NOTE — ED Notes (Signed)
Pt to radiology.

## 2013-08-29 NOTE — Consult Note (Cosign Needed)
See full consult note to follow    Pulmonary and Critical Care Medicine [facility] Pager: [phone]  08/29/2013, 8:20 PM

## 2013-08-29 NOTE — ED Notes (Signed)
Pt transported to floor by another RN, NAD upon leaving ED.

## 2013-08-29 NOTE — ED Notes (Signed)
BC x2 obtained and sent to lab by phlebotomist.

## 2013-08-30 ENCOUNTER — Inpatient Hospital Stay (HOSPITAL_COMMUNITY): Payer: Medicare Other

## 2013-08-30 DIAGNOSIS — R0902 Hypoxemia: Secondary | ICD-10-CM

## 2013-08-30 DIAGNOSIS — J449 Chronic obstructive pulmonary disease, unspecified: Secondary | ICD-10-CM

## 2013-08-30 DIAGNOSIS — J9620 Acute and chronic respiratory failure, unspecified whether with hypoxia or hypercapnia: Secondary | ICD-10-CM

## 2013-08-30 DIAGNOSIS — B4481 Allergic bronchopulmonary aspergillosis: Secondary | ICD-10-CM

## 2013-08-30 DIAGNOSIS — M069 Rheumatoid arthritis, unspecified: Secondary | ICD-10-CM

## 2013-08-30 DIAGNOSIS — J8409 Other alveolar and parieto-alveolar conditions: Secondary | ICD-10-CM

## 2013-08-30 DIAGNOSIS — J189 Pneumonia, unspecified organism: Secondary | ICD-10-CM

## 2013-08-30 LAB — RESPIRATORY VIRUS PANEL
Adenovirus: NOT DETECTED
INFLUENZA B 1: NOT DETECTED
Influenza A H1: NOT DETECTED
Influenza A H3: NOT DETECTED
Influenza A: NOT DETECTED
Metapneumovirus: NOT DETECTED
PARAINFLUENZA 2 A: NOT DETECTED
PARAINFLUENZA 3 A: NOT DETECTED
Parainfluenza 1: NOT DETECTED
Respiratory Syncytial Virus A: NOT DETECTED
Respiratory Syncytial Virus B: NOT DETECTED
Rhinovirus: NOT DETECTED

## 2013-08-30 LAB — BASIC METABOLIC PANEL
BUN: 13 mg/dL (ref 6–23)
CHLORIDE: 90 meq/L — AB (ref 96–112)
CO2: 29 mEq/L (ref 19–32)
Calcium: 9 mg/dL (ref 8.4–10.5)
Creatinine, Ser: 0.63 mg/dL (ref 0.50–1.10)
GFR calc non Af Amer: >90 mL/min (ref >90)
GLUCOSE: 174 mg/dL — AB (ref 70–99)
Potassium: 4.3 mEq/L (ref 3.7–5.3)
Sodium: 130 mEq/L — ABNORMAL LOW (ref 137–147)

## 2013-08-30 LAB — LEGIONELLA ANTIGEN, URINE: LEGIONELLA ANTIGEN, URINE: NEGATIVE

## 2013-08-30 LAB — CBC
HEMATOCRIT: 32.3 % — AB (ref 36.0–46.0)
HEMOGLOBIN: 9.6 g/dL — AB (ref 12.0–15.0)
MCH: 23.3 pg — AB (ref 26.0–34.0)
MCHC: 29.7 g/dL — AB (ref 30.0–36.0)
MCV: 78.4 fL (ref 78.0–100.0)
Platelets: 175 10*3/uL (ref 150–400)
RBC: 4.12 MIL/uL (ref 3.87–5.11)
RDW: 21.8 % — ABNORMAL HIGH (ref 11.5–15.5)
WBC: 6.8 10*3/uL (ref 4.0–10.5)

## 2013-08-30 LAB — GLUCOSE, CAPILLARY
Glucose-Capillary: 187 mg/dL — ABNORMAL HIGH (ref 70–99)
Glucose-Capillary: 189 mg/dL — ABNORMAL HIGH (ref 70–99)
Glucose-Capillary: 190 mg/dL — ABNORMAL HIGH (ref 70–99)

## 2013-08-30 LAB — HIV ANTIBODY (ROUTINE TESTING W REFLEX): HIV: NONREACTIVE

## 2013-08-30 LAB — MRSA PCR SCREENING: MRSA by PCR: INVALID — AB

## 2013-08-30 LAB — STREP PNEUMONIAE URINARY ANTIGEN: Strep Pneumo Urinary Antigen: POSITIVE — AB

## 2013-08-30 MED ORDER — DOCUSATE SODIUM 100 MG PO CAPS
100.0000 mg | ORAL_CAPSULE | Freq: Two times a day (BID) | ORAL | Status: DC
  Administered 2013-08-30 – 2013-09-07 (×9): 100 mg via ORAL
  Filled 2013-08-30 (×21): qty 1

## 2013-08-30 MED ORDER — LIDOCAINE-EPINEPHRINE (PF) 2 %-1:200000 IJ SOLN
INTRAMUSCULAR | Status: AC
  Filled 2013-08-30: qty 20

## 2013-08-30 MED ORDER — SENNA 8.6 MG PO TABS
1.0000 | ORAL_TABLET | Freq: Every day | ORAL | Status: DC | PRN
  Administered 2013-08-31: 8.6 mg via ORAL
  Filled 2013-08-30: qty 1

## 2013-08-30 NOTE — Consult Note (Signed)
I have reviewed and discussed the care of this patient in detail with the nurse practitioner including pertinent patient records, physical exam findings and data. I agree with details of this encounter.  

## 2013-08-30 NOTE — Procedures (Signed)
Successful placement of right IJ approach tunnelled  dual lumen PICC line with tip at the superior caval-atrial junction.  The PICC line is ready for immediate use.

## 2013-08-30 NOTE — Progress Notes (Signed)
Patient active with [facility] Care Management services. At last discharge, it was thought patient would go home with hospice. However she elected to go home with home health and continue with [facility] Care Management services. [facility] Pharmacist has been trying to assist patient with medications. However, she does not qualify for low income subsidy. It has been explained to daughter, Lelon Mast, and patient that a certain amount has to be paid out of pocket before her costs are lower. Will continue to follow patient. Spoke with inpatient RN case manager to make aware [facility] Care Management following.  Raiford Noble, MSN- RN,BSN- [facility] [phone]

## 2013-08-30 NOTE — Progress Notes (Signed)
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 -Broad spectrum antibiotics for ?HCAP. 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 -Appreciate pulmonary input.  Right Shoulder Pain -? Referred diaphragmatic pain from constipation-->start bowel regimen. -RUQ [place].  HCAP as above Pulmonary Aspergillosis as above. Biopsy to confirm would be too invasive.  Code Status: DNR Family Communication: Daughter at bedside  Disposition Plan: To be determined. Will need to continue to discuss GOC. Maybe palliative consult would be helpful for this. Midline to be placed as she has  difficult IV access.   Consultants:  Pulmonary   Antibiotics:  Vanc  imipenem   voriconazole (antifungal)  Subjective: Frail, still SOB  Objective: Filed Vitals:   08/30/13 1200 08/30/13 1400 08/30/13 1524 08/30/13 1600  BP: 147/82 118/81  141/96  Pulse: 120 114  113  Temp: 97.7 F (36.5 C)     TempSrc: Oral     Resp: 35 17  25  Height:      Weight:      SpO2: 98% 98% 99% 100%    Intake/Output Summary (Last 24 hours) at 08/30/13 1623 Last data filed at 08/30/13 1600  Gross per 24 hour  Intake   1100 ml  Output   1950 ml  Net   -850 ml   Filed Weights   08/29/13 2045 08/30/13 0145  Weight: 90.674 kg (199 lb 14.4 oz) 90.7 kg (199 lb 15.3 oz)    Exam:   General:  AA Ox3  Cardiovascular: RRR  Respiratory: bilateral crackles  Abdomen: S/NT/ND/+BS  Extremities: trace bilateral edema   Neurologic:  Non-focal  Data Reviewed: Basic Metabolic Panel:  Recent Labs Lab 08/29/13 1308 08/30/13 0320  NA 127* 130*  K 4.8 4.3  CL 82* 90*  CO2 31 29  GLUCOSE 173* 174*  BUN 18 13  CREATININE 0.80 0.63  CALCIUM 9.8 9.0   Liver Function  Tests:  Recent Labs Lab 08/29/13 1308  AST 36  ALT 33  ALKPHOS 108  BILITOT 0.5  PROT 7.4  ALBUMIN 3.4*    Recent Labs Lab 08/29/13 1308  LIPASE 30   No results found for this basename: AMMONIA,  in the last 168 hours CBC:  Recent Labs Lab 08/29/13 1308 08/30/13 0320  WBC 10.1 6.8  HGB 11.3* 9.6*  HCT 37.9 32.3*  MCV 78.3 78.4  PLT 218 175   Cardiac Enzymes: No results found for this basename: CKTOTAL, CKMB, CKMBINDEX, TROPONINI,  in the last 168 hours BNP (last 3 results)  Recent Labs  03/25/13 0601 04/14/13 2300 08/10/13 1554  PROBNP 1150.0* 301.6* 1099.0*   CBG:  Recent Labs Lab 08/29/13 2149 08/30/13 0750 08/30/13 1153  GLUCAP 187* 189* 190*    Recent Results (from the past 240 hour(s))  MRSA PCR SCREENING     Status: Abnormal   Collection Time    08/29/13  8:49 PM      Result Value Ref Range Status   MRSA by PCR INVALID RESULTS, SPECIMEN SENT FOR CULTURE (*) NEGATIVE Final   Comment: RESULT CALLED TO, READ BACK BY AND VERIFIED WITH:     ASMITH RN AT 0120 ON [id number] BY DLONG                The GeneXpert MRSA Assay (FDA     approved for NASAL specimens     only),  is one component of a     comprehensive MRSA colonization     surveillance program. It is not     intended to diagnose MRSA     infection nor to guide or     monitor treatment for     MRSA infections.     Studies: Dg Abd 1 View  08/29/2013   CLINICAL DATA:  Abdominal pain, constipation  EXAM: ABDOMEN - 1 VIEW  COMPARISON:  None.  FINDINGS: There is nonspecific nonobstructive bowel gas pattern. Moderate stool noted in right colon. Some gas noted in transverse colon.  IMPRESSION: Nonspecific nonobstructive bowel gas pattern. Moderate stool noted in right colon.   Electronically Signed   By: Natasha Mead M.D.   On: 08/29/2013 15:51   Ct Angio Chest W/cm &/or Wo Cm  08/29/2013   CLINICAL DATA:  COPD and pneumonia  EXAM: CT ANGIOGRAPHY CHEST WITH CONTRAST  TECHNIQUE: Multidetector CT  imaging of the chest was performed using the standard protocol during bolus administration of intravenous contrast. Multiplanar CT image reconstructions and MIPs were obtained to evaluate the vascular anatomy.  CONTRAST:  OMNIPAQUE IOHEXOL 350 MG/ML SOLN  COMPARISON:  DG CHEST 1V PORT dated 08/29/2013; CT CHEST W/O CM dated 08/10/2013; DG CHEST 2 VIEW dated 04/29/2013; CT CHEST W/CM dated 10/19/2012  FINDINGS: There are no filling defects in the pulmonary arterial tree to suggest acute pulmonary thromboembolism.  No evidence of aortic dissection. Maximal ascending aortic diameter is 3.7 cm. Atherosclerotic changes at the origin of the left common carotid artery and left subclavian artery are noted. Left ventricular hypertrophy is present.  No significant pericardial effusion is present. Stable coronary artery calcifications. Small mediastinal nodes are stable.  Small left pleural effusion is stable and is probably loculated.  Fibrotic lung disease within apical prep collection is stable. Scattered patchy opacities in the lower lungs are also stable. Confluent opacity in the superior segment of the left lower lobe is also stable. Severe emphysema.  No pneumothorax.  No acute bony deformity.  Round soft tissue density in the left breast is stable.  Review of the MIP images confirms the above findings.  IMPRESSION: No evidence of acute pulmonary thromboembolism.  Chronic lung disease.  Stable confluent pulmonary opacity in the superior segment of the left lower lobe previously characterized as pneumonia.   Electronically Signed   By: Maryclare Bean M.D.   On: 08/29/2013 17:16   Dg Chest Port 1 View  08/29/2013   CLINICAL DATA:  Shortness of breath, asthma, emphysema  EXAM: PORTABLE CHEST - 1 VIEW  COMPARISON:  08/25/2013 and 08/13/2013  FINDINGS: Cardiomediastinal silhouette is stable. Extensive scarring and volume loss bilateral upper lobe again noted. Stable emphysematous changes. Again noted hilar retraction  bilaterally. No definite new consolidation is noted. Stable airspace opacities in upper lobes.  IMPRESSION: Extensive scarring and volume loss bilateral upper lobe again noted. Stable emphysematous changes. Again noted hilar retraction bilaterally. No definite new consolidation is noted. Stable airspace opacities in upper lobes.   Electronically Signed   By: Natasha Mead M.D.   On: 08/29/2013 13:23    Scheduled Meds: . allopurinol  100 mg Oral Daily  . docusate sodium  100 mg Oral BID  . enoxaparin (LOVENOX) injection  40 mg Subcutaneous Q24H  . feeding supplement (ENSURE COMPLETE)  237 mL Oral BID BM  . furosemide  40 mg Oral Daily  . imipenem-cilastatin  500 mg Intravenous 4 times per day  . insulin aspart  0-15  Units Subcutaneous TID WC  . ipratropium-albuterol  3 mL Nebulization QID  . leflunomide  20 mg Oral Daily  . levothyroxine  50 mcg Oral QAC breakfast  . loratadine  10 mg Oral Daily  . methylPREDNISolone (SOLU-MEDROL) injection  125 mg Intravenous Daily  . mometasone-formoterol  2 puff Inhalation BID  . montelukast  10 mg Oral Daily  . pantoprazole  40 mg Oral Daily  . potassium chloride SA  60 mEq Oral Daily  . sodium chloride  3 mL Intravenous Q12H  . vancomycin  1,000 mg Intravenous Q8H  . voriconazole  200 mg Oral Q12H   Continuous Infusions:   Active Problems:   Chronic diastolic heart failure, NYHA class 1   Hypothyroidism   Diabetes mellitus, type II   Protein-calorie malnutrition, severe   Physical deconditioning   GERD (gastroesophageal reflux disease)   Chronic respiratory failure   Hyperlipidemia   Hypertension   HCAP (healthcare-associated pneumonia)   Pulmonary aspergillosis    Time spent: 35 minutes. Greater than 50% of this time was spent in direct contact with the patient coordinating care.    Chaya Jan  [facility] Pager [phone]  If 7PM-7AM, please contact night-coverage at [email or web address], password [facility] 08/30/2013, 4:23 PM   LOS: 1 day

## 2013-08-30 NOTE — Progress Notes (Signed)
CARE MANAGEMENT NOTE 08/30/2013  Patient:  Jacqueline Washington, Jacqueline Washington   Account Number:  [id number]  Date Initiated:  08/30/2013  Documentation initiated by:  Burnsworth,RHONDA  Subjective/Objective Assessment:   pt with hx of recent pna and end stage copd, readmitt4ed for increased wob and tachycardia, Md has started end of life issue discussion, palliative care consult done     Action/Plan:   tbd determined will follow for needs   Anticipated DC Date:  09/02/2013   Anticipated DC Plan:  HOSPICE MEDICAL FACILITY  In-house referral  Hospice / Palliative Care      DC Planning Services  NA      [facility] Choice  NA   Choice offered to / List presented to:  NA   DME arranged  NA      DME agency  NA     HH arranged  NA      HH agency  NA   Status of service:  In process, will continue to follow Medicare Important Message given?  NA - LOS <3 / Initial given by admissions (If response is "NO", the following Medicare IM given date fields will be blank) Date Medicare IM given:   Date Additional Medicare IM given:    Discharge Disposition:    Per UR Regulation:  Reviewed for med. necessity/level of care/duration of stay  If discussed at Long Length of Stay Meetings, dates discussed:   08/30/2013    Comments:  [id number] Earlene Plater RN, BSN, [place] [phone] Chart Reviewed for discharge and hospital needs. Discharge needs at time of review:  None present will follow for needs. Review of patient progress due on [id number].

## 2013-08-30 NOTE — Consult Note (Signed)
PULMONARY / CRITICAL CARE MEDICINE   Name: Jacqueline Washington MRN: [id number] DOB: 24-May-1951    ADMISSION DATE:  08/29/2013 CONSULTATION DATE:  3/17  REFERRING MD :  Philip Aspen PRIMARY SERVICE: [facility]  CHIEF COMPLAINT:  Right shoulder and side pain  BRIEF PATIENT DESCRIPTION: 63 y/o female with a complex pulmonary history including COPD and rheumatoid arthritis related lung disease was re-admitted on 3/16 with right shoulder pain and increasing shortness of breath.  SIGNIFICANT EVENTS / STUDIES:  3/16 CT Angio chest > no pe, severe bilateral emphysema, left effusion unchanged, ? 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; several areas of frank consolidation RUL and LUL  LINES / TUBES:   CULTURES: 3/17 sputum bacterial> 3/17 sputum fungal >  ANTIBIOTICS: 3/16 vanc >> 3/16 imipenem>> VFend (pre-admission) >>  HISTORY OF PRESENT ILLNESS:  63 y/o female with a complex pulmonary history including COPD and rheumatoid arthritis related lung disease was re-admitted on 3/16 with right shoulder pain and increasing shortness of breath. She noted that after going home she initially felt well, but she started to develop increasing R shoulder and chest wall pain which was sharp and constant.  She also had epigastric pain and RUQ pain. During this time she developed constipation and has not had a bowel movement since around 3/9.  She feels like her dyspnea has worsened somewhat but interestingly this is not her chief complaint.  Her sputum production has remained about the same.  No fevers or chills.   PAST MEDICAL HISTORY :  Past Medical History  Diagnosis Date  . Asthma   . Emphysema     02 dependent 3L/min  . Emphysema   . Pneumonia   . Hypothyroidism   . Diabetes mellitus, type II   . Obesity   . Diastolic heart failure 09/22/2012    Grade 1  . Emphysema   . Rheumatoid arthritis(714.0)   . Hyperlipidemia   . Hypertension   . Vitamin D deficiency    Past Surgical History   Procedure Laterality Date  . Tubal ligation    . Colonoscopy    . Video bronchoscopy N/A 11/18/2012    Procedure: VIDEO BRONCHOSCOPY WITH FLUORO;  Surgeon: Storm Frisk, MD;  Location: [facility] ENDOSCOPY;  Service: Cardiopulmonary;  Laterality: N/A;  . Eye surgery  at age 12   Prior to Admission medications   Medication Sig Start Date End Date Taking? Authorizing Provider  albuterol (PROVENTIL) (2.5 MG/3ML) 0.083% nebulizer solution Take 3 mLs (2.5 mg total) by nebulization 4 (four) times daily. DX 496 06/23/13  Yes Storm Frisk, MD  allopurinol (ZYLOPRIM) 100 MG tablet Take 100 mg by mouth daily.   Yes Historical Provider, MD  cetirizine (ZYRTEC) 10 MG tablet Take 10 mg by mouth daily.   Yes Historical Provider, MD  feeding supplement, ENSURE COMPLETE, (ENSURE COMPLETE) LIQD Take 237 mLs by mouth 2 (two) times daily between meals. 08/16/13  Yes Meredeth Ide, MD  Fluticasone-Salmeterol (ADVAIR) 500-50 MCG/DOSE AEPB Inhale 1 puff into the lungs 2 (two) times daily.   Yes Historical Provider, MD  furosemide (LASIX) 40 MG tablet Take 40 mg by mouth daily.    Yes Historical Provider, MD  HYDROcodone-acetaminophen (NORCO/VICODIN) 5-325 MG per tablet Take 1 tablet by mouth every 4 (four) hours as needed. 07/07/13  Yes Historical Provider, MD  ipratropium (ATROVENT) 0.02 % nebulizer solution Take 2.5 mLs (0.5 mg total) by nebulization every 6 (six) hours as needed for wheezing or shortness of breath. Dx 496  04/19/13  Yes Simonne Martinet, NP  leflunomide (ARAVA) 20 MG tablet Take 20 mg by mouth daily. 06/06/13  Yes Historical Provider, MD  levothyroxine (SYNTHROID, LEVOTHROID) 50 MCG tablet TAKE 1 TABLET EVERY DAY 08/12/13  Yes Melissa R Smith, PA-C  montelukast (SINGULAIR) 10 MG tablet Take 1 tablet by mouth daily.   Yes Historical Provider, MD  morphine 10 MG/5ML solution Take 2.5 mLs (5 mg total) by mouth every 6 (six) hours as needed for severe pain (Shortness of breath). 08/16/13  Yes Meredeth Ide, MD   nystatin cream (MYCOSTATIN) Apply 1 application topically daily as needed (under breasts).   Yes Historical Provider, MD  pantoprazole (PROTONIX) 40 MG tablet Take 1 tablet (40 mg total) by mouth daily. 04/19/13  Yes Simonne Martinet, NP  potassium chloride SA (K-DUR,KLOR-CON) 20 MEQ tablet Take 60 mEq by mouth daily. 10/26/12  Yes Laveda Norman, MD  predniSONE (DELTASONE) 20 MG tablet Reduce to 35 mg daily for 7days then reduce to 30mg  per day  for seven days, then reduce to 25 mg po daily for seven days and then reduce to 20 mg po daily and stay on 20 mg po daily (use 20mg  and 5mg  pred tabs) 08/16/13  Yes Meredeth Ide, MD  SPIRIVA HANDIHALER 18 MCG inhalation capsule INHALE 1 PUFF DAILY 08/25/13  Yes Melissa R Smith, PA-C  voriconazole (VFEND) 200 MG tablet Take 1 tablet (200 mg total) by mouth every 12 (twelve) hours. 08/16/13  Yes Meredeth Ide, MD  white petrolatum (VASELINE) GEL Apply 1 application topically as needed for lip care. 08/23/13  Yes Quentin Mulling, PA-C   Allergies  Allergen Reactions  . Chantix [Varenicline] Other (See Comments)    Unknown " blurry vision"  . Hctz [Hydrochlorothiazide] Other (See Comments)    unknown  . Penicillins Rash  . Sulfonamide Derivatives Rash  . Tetanus Toxoid Other (See Comments)    Knot on arm    FAMILY HISTORY:  Family History  Problem Relation Age of Onset  . Asthma Mother   . Heart disease Mother   . Clotting disorder Mother   . Heart attack Mother   . Diabetes Mother   . Hypertension Mother   . Cirrhosis Mother   . Colon cancer Neg Hx   . Heart attack Father    SOCIAL HISTORY:  reports that she quit smoking about 6 years ago. Her smoking use included Cigarettes. She has a 129 pack-year smoking history. She has never used smokeless tobacco. She reports that she drinks alcohol. She reports that she does not use illicit drugs.  REVIEW OF SYSTEMS:   Gen: Denies fever, chills, weight change, + fatigue, night sweats HEENT: Denies blurred  vision, double vision, hearing loss, tinnitus, sinus congestion, rhinorrhea, sore throat, neck stiffness, dysphagia PULM: per HPI CV: Per HPI GI: notes some abdominal pain, denies nausea, vomiting, diarrhea, hematochezia, melena, notes constipation, change in bowel habits GU: Denies dysuria, hematuria, polyuria, oliguria, urethral discharge Endocrine: Denies hot or cold intolerance, polyuria, polyphagia or appetite change Derm: Denies rash, dry skin, scaling or peeling skin change Heme: Denies easy bruising, bleeding, bleeding gums Neuro: Denies headache, numbness, weakness, slurred speech, loss of memory or consciousness   SUBJECTIVE:   VITAL SIGNS: Temp:  [97.4 F (36.3 C)-98.1 F (36.7 C)] 97.5 F (36.4 C) (03/17 0800) Pulse Rate:  [100-139] 114 (03/17 0600) Resp:  [14-39] 16 (03/17 0600) BP: (102-183)/(60-110) 126/72 mmHg (03/17 0600) SpO2:  [96 %-100 %] 98 % (03/17  0600) Weight:  [90.674 kg (199 lb 14.4 oz)-90.7 kg (199 lb 15.3 oz)] 90.7 kg (199 lb 15.3 oz) (03/17 0145) HEMODYNAMICS:   VENTILATOR SETTINGS:   INTAKE / OUTPUT: Intake/Output     03/16 0701 - 03/17 0700 03/17 0701 - 03/18 0700   IV Piggyback 600    Total Intake(mL/kg) 600 (6.6)    Urine (mL/kg/hr) 450    Total Output 450     Net +150            PHYSICAL EXAMINATION:  Gen: chronically ill appearing, tachypnic but no acute distress HEENT: NCAT, EOMi, OP clear,  PULM: Crackles bilaterally CV: RRR, no mgr, no JVD AB: BS+, soft, mildly tender RUQ, no hsm Ext: warm, trace edema, no clubbing, no cyanosis Derm: no rash or skin breakdown Neuro: A&Ox4, CN II-XII intact, MAEW   LABS:  CBC  Recent Labs Lab 08/23/13 1213 08/29/13 1308 08/30/13 0320  WBC 10.6* 10.1 6.8  HGB 10.0* 11.3* 9.6*  HCT 32.9* 37.9 32.3*  PLT 260 218 175   Coag's  Recent Labs Lab 08/23/13 1213  APTT 23*  INR 0.97   BMET  Recent Labs Lab 08/23/13 1213 08/29/13 1308 08/30/13 0320  NA 139 127* 130*  K 3.9 4.8  4.3  CL 99 82* 90*  CO2 29 31 29   BUN 14 18 13   CREATININE 0.81 0.80 0.63  GLUCOSE 159* 173* 174*   Electrolytes  Recent Labs Lab 08/23/13 1213 08/29/13 1308 08/30/13 0320  CALCIUM 8.5 9.8 9.0  MG 1.8  --   --    Sepsis Markers No results found for this basename: LATICACIDVEN, PROCALCITON, O2SATVEN,  in the last 168 hours ABG No results found for this basename: PHART, PCO2ART, PO2ART,  in the last 168 hours Liver Enzymes  Recent Labs Lab 08/23/13 1213 08/29/13 1308  AST 19 36  ALT 16 33  ALKPHOS 78 108  BILITOT 0.4 0.5  ALBUMIN 3.3* 3.4*   Cardiac Enzymes No results found for this basename: TROPONINI, PROBNP,  in the last 168 hours Glucose  Recent Labs Lab 08/29/13 2149 08/30/13 0750  GLUCAP 187* 189*    Imaging  See above  ASSESSMENT / PLAN:  PULMONARY A: COPD (2013 FEV1 1.23 L, 54% pred) with severe emphysema complicated by rheumatoid arthritis related lung disease.   My suspicion is that she has organizing pneumonia as well as cavitary change related to the RA.  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  The only way to confirm this would be to perform a biopsy which is too dangerous. The ddx includes bacterial pneumonia, but she doesn't have the acute change with fevers one would expect with that.  Regardless the definitive diagnosis, her overall prognosis from a pulmonary perspective is poor.   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 Constipation from  narcotics P:   -RUQ ultrasound -bowel regimen -consult palliative care to help 09/01/13 with symptom management    INFECTIOUS A:  HCAP? P:   -agree with empiric antibiotic coverage -f/u cultures  Global:  She was to have a port this week, not a good idea right now as she may have HCAP.  Will request a midline as she has very difficult IV access. Reassess port later in the week.    Will also need to discuss goals of care more later in the week.  We discussed it briefly today and she understands that she is not going to get better.  I did not specifically bring up hospice today, but we need to discuss again this week.  Yolonda KidaMCQUAID, Jessamy Torosyan [place] [facility] Pager: [phone] Cell: [phone] If no response, call [phone]  08/30/2013, 9:36 AM

## 2013-08-30 NOTE — Progress Notes (Signed)
PCCM  I have seen and examined this pt .  I know this patient extremely well. I looked at the serial CT scans on this case.  I do NOT feel she has an aspergilloma. The CT scans do not support this in my opinion.  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  We had her on high dose steroids for a focal area of mass like density on RUL.  She never had TB .  An FOB done 1 year ago did NOT reveal any TB.    She now has cavitated the RUL area.  The LLL superior segment is likely undoubtedly one more are of RA related organized pneumonia and NOT a fungus ball.  I would try to get an AFB sputum if possible, she is not a candidate for FOB  I spoke to the pt about EOL issues and palliative care. A PMT consult is warranted but I am not sure the pt or her family are ready yet for home Hospice although she would easily qualify and I am happy to be primary for this.  I will call for the PMT consult and discuss with ID and Dr Kendrick Fries.  I would consider d/c of the VFend.  Dorcas Carrow Beeper  [phone]  Cell  [phone]  If no response or cell goes to voicemail, call beeper [phone]

## 2013-08-30 NOTE — Consult Note (Addendum)
[name]  Date of Admission:  08/29/2013  Date of Consult:  08/30/2013  Reason for Consult: Aspergilosis Referring Physician: Ree Kida  Impression/Recommendation Fungal Diona Foley  ?Aspergilosis COPD  BOOP RA DM2  Would- Stop Arava if ok with PCP Continue her current meds Check fungal panel Check quantiferon gold Will try to get voriconazole blood level  Comment- A BAL would be helpful but I am not sure her respiratory status would tolerate and I am not sure that is in accordance with her wishes (see previous palliative care discussions). Aspergillus is certainly a possibility and her arava/leflunimide would seem to increase her risk for this. As well, she gives a significant TB exposure hx (although she also states she has had 2 previous negative Tb exams). Would also query if this could be a rheumatoid nodule?  Thank you so much for this interesting consult,   Bobby Rumpf (pager) [phone] [email or web address]  Jacqueline Washington is an 63 y.o. female.  HPI: 63 yo F with hx of COPD and BOOP, DM2,and RA lung disease (on Avara). She was hospitalized 08/10/13 to 08/16/13 with 4 weeks of worsening SOB and was found on CT to have a LUL cavitary lesion concerning for fungal ball. She was treated with levaquin and sent home with rx for Voriconazole. She also received a steroid taper. Her galactomannin assay was (-).  Sputum Cx from 3-2 grew C albicans.   She returned to the ED on 3/16 with R sided pleuritic rib pain as well as cough and increased O2 requirements at home. She had no f/c at home. On arrival her WBC was normal, her CT showed : No evidence of acute pulmonary thromboembolism. Chronic lung disease. Stable confluent pulmonary opacity in the superior segment of the left lower lobe previously characterized as pneumonia. She was started on imipenem/vanco (for possible HCAP) and voriconazole was continued.    Past Medical History  Diagnosis Date  .  Asthma   . Emphysema     02 dependent 3L/min  . Emphysema   . Pneumonia   . Hypothyroidism   . Diabetes mellitus, type II   . Obesity   . Diastolic heart failure 08/19/6813    Grade 1  . Emphysema   . Rheumatoid arthritis(714.0)   . Hyperlipidemia   . Hypertension   . Vitamin D deficiency     Past Surgical History  Procedure Laterality Date  . Tubal ligation    . Colonoscopy    . Video bronchoscopy N/A 11/18/2012    Procedure: VIDEO BRONCHOSCOPY WITH FLUORO;  Surgeon: Elsie Stain, MD;  Location: [facility];  Service: Cardiopulmonary;  Laterality: N/A;  . Eye surgery  at age 40     Allergies  Allergen Reactions  . Chantix [Varenicline] Other (See Comments)    Unknown " blurry vision"  . Hctz [Hydrochlorothiazide] Other (See Comments)    unknown  . Penicillins Rash  . Sulfonamide Derivatives Rash  . Tetanus Toxoid Other (See Comments)    Knot on arm    Medications:  Scheduled: . allopurinol  100 mg Oral Daily  . docusate sodium  100 mg Oral BID  . enoxaparin (LOVENOX) injection  40 mg Subcutaneous Q24H  . feeding supplement (ENSURE COMPLETE)  237 mL Oral BID BM  . furosemide  40 mg Oral Daily  . imipenem-cilastatin  500 mg Intravenous 4 times per day  . insulin aspart  0-15 Units Subcutaneous TID WC  . ipratropium-albuterol  3 mL Nebulization QID  .  leflunomide  20 mg Oral Daily  . levothyroxine  50 mcg Oral QAC breakfast  . loratadine  10 mg Oral Daily  . methylPREDNISolone (SOLU-MEDROL) injection  125 mg Intravenous Daily  . mometasone-formoterol  2 puff Inhalation BID  . montelukast  10 mg Oral Daily  . pantoprazole  40 mg Oral Daily  . potassium chloride SA  60 mEq Oral Daily  . sodium chloride  3 mL Intravenous Q12H  . vancomycin  1,000 mg Intravenous Q8H  . voriconazole  200 mg Oral Q12H    Abtx:  Anti-infectives   Start     Dose/Rate Route Frequency Ordered Stop   08/29/13 2200  voriconazole (VFEND) tablet 200 mg     200 mg Oral Every 12 hours  08/29/13 2042     08/29/13 2000  vancomycin (VANCOCIN) IVPB 1000 mg/200 mL premix     1,000 mg 200 mL/hr over 60 Minutes Intravenous Every 8 hours 08/29/13 1851     08/29/13 2000  imipenem-cilastatin (PRIMAXIN) 500 mg in sodium chloride 0.9 % 100 mL IVPB     500 mg 200 mL/hr over 30 Minutes Intravenous 4 times per day 08/29/13 1851        Total days of antibiotics:  3/16 Vanco/imipenem  3/2 Voriconazole          Social History:  reports that she quit smoking about 6 years ago. Her smoking use included Cigarettes. She has a 129 pack-year smoking history. She has never used smokeless tobacco. She reports that she drinks alcohol. She reports that she does not use illicit drugs.  Family History  Problem Relation Age of Onset  . Asthma Mother   . Heart disease Mother   . Clotting disorder Mother   . Heart attack Mother   . Diabetes Mother   . Hypertension Mother   . Cirrhosis Mother   . Colon cancer Neg Hx   . Heart attack Father     General ROS: states she has had small blisters in her mouth. she has had R cervical LN, no f/c, normal urination, no BM, denies living outside of [place], states her sister had TB when she was a child and has a "spot on her lungs". see HPI.   Blood pressure 118/81, pulse 114, temperature 97.7 F (36.5 C), temperature source Oral, resp. rate 17, height _0  (1.626 m), weight 90.7 kg (199 lb 15.3 oz), SpO2 99.00%. General appearance: alert, cooperative, no distress, morbidly obese and pale Eyes: negative findings: pupils equal, round, reactive to light and accomodation Throat: normal findings: oropharynx pink & moist without lesions or evidence of thrush Neck: no adenopathy and supple, symmetrical, trachea midline Lungs: rhonchi bilaterally Heart: regular rate and rhythm Abdomen: normal findings: bowel sounds normal and soft, non-tender Extremities: edema none at ankles Lymph nodes: Cervical adenopathy: none and Axillary adenopathy: none   Results for  orders placed during the hospital encounter of 08/29/13 (from the past 48 hour(s))  CBC     Status: Abnormal   Collection Time    08/29/13  1:08 PM      Result Value Ref Range   WBC 10.1  4.0 - 10.5 K/uL   RBC 4.84  3.87 - 5.11 MIL/uL   Hemoglobin 11.3 (*) 12.0 - 15.0 g/dL   HCT 37.9  36.0 - 46.0 %   MCV 78.3  78.0 - 100.0 fL   MCH 23.3 (*) 26.0 - 34.0 pg   MCHC 29.8 (*) 30.0 - 36.0 g/dL   RDW 21.7 (*)  11.5 - 15.5 %   Platelets 218  150 - 400 K/uL  COMPREHENSIVE METABOLIC PANEL     Status: Abnormal   Collection Time    08/29/13  1:08 PM      Result Value Ref Range   Sodium 127 (*) 137 - 147 mEq/L   Potassium 4.8  3.7 - 5.3 mEq/L   Chloride 82 (*) 96 - 112 mEq/L   CO2 31  19 - 32 mEq/L   Glucose, Bld 173 (*) 70 - 99 mg/dL   BUN 18  6 - 23 mg/dL   Creatinine, Ser 0.80  0.50 - 1.10 mg/dL   Calcium 9.8  8.4 - 10.5 mg/dL   Total Protein 7.4  6.0 - 8.3 g/dL   Albumin 3.4 (*) 3.5 - 5.2 g/dL   AST 36  0 - 37 U/L   ALT 33  0 - 35 U/L   Alkaline Phosphatase 108  39 - 117 U/L   Total Bilirubin 0.5  0.3 - 1.2 mg/dL   GFR calc non Af Amer 77 (*) >90 mL/min   GFR calc Af Amer 90 (*) >90 mL/min   Comment: (NOTE)     The eGFR has been calculated using the CKD EPI equation.     This calculation has not been validated in all clinical situations.     eGFR's persistently <90 mL/min signify possible Chronic Kidney     Disease.  LIPASE, BLOOD     Status: None   Collection Time    08/29/13  1:08 PM      Result Value Ref Range   Lipase 30  11 - 59 U/L  I-STAT TROPOININ, ED     Status: None   Collection Time    08/29/13  1:17 PM      Result Value Ref Range   Troponin i, poc 0.01  0.00 - 0.08 ng/mL   Comment 3            Comment: Due to the release kinetics of cTnI,     a negative result within the first hours     of the onset of symptoms does not rule out     myocardial infarction with certainty.     If myocardial infarction is still suspected,     repeat the test at appropriate  intervals.  LEGIONELLA ANTIGEN, URINE     Status: None   Collection Time    08/29/13  8:49 PM      Result Value Ref Range   Specimen Description URINE, CLEAN CATCH     Special Requests NONE     Legionella Antigen, Urine       Value: Negative for Legionella pneumophilia serogroup 1     Performed at [facility] Status 08/30/2013 FINAL    STREP PNEUMONIAE URINARY ANTIGEN     Status: Abnormal   Collection Time    08/29/13  8:49 PM      Result Value Ref Range   Strep Pneumo Urinary Antigen POSITIVE (*) NEGATIVE   Comment: PERFORMED AT [facility]     Performed at [facility]  MRSA PCR SCREENING     Status: Abnormal   Collection Time    08/29/13  8:49 PM      Result Value Ref Range   MRSA by PCR INVALID RESULTS, SPECIMEN SENT FOR CULTURE (*) NEGATIVE   Comment: RESULT CALLED TO, READ BACK BY AND VERIFIED WITH:     ASMITH RN AT 0120  ON [id number] BY DLONG                The GeneXpert MRSA Assay (FDA     approved for NASAL specimens     only), is one component of a     comprehensive MRSA colonization     surveillance program. It is not     intended to diagnose MRSA     infection nor to guide or     monitor treatment for     MRSA infections.  GLUCOSE, CAPILLARY     Status: Abnormal   Collection Time    08/29/13  9:49 PM      Result Value Ref Range   Glucose-Capillary 187 (*) 70 - 99 mg/dL   Comment 1 Documented in Chart     Comment 2 Notify RN    BASIC METABOLIC PANEL     Status: Abnormal   Collection Time    08/30/13  3:20 AM      Result Value Ref Range   Sodium 130 (*) 137 - 147 mEq/L   Potassium 4.3  3.7 - 5.3 mEq/L   Chloride 90 (*) 96 - 112 mEq/L   Comment: DELTA CHECK NOTED   CO2 29  19 - 32 mEq/L   Glucose, Bld 174 (*) 70 - 99 mg/dL   BUN 13  6 - 23 mg/dL   Creatinine, Ser 0.63  0.50 - 1.10 mg/dL   Calcium 9.0  8.4 - 10.5 mg/dL   GFR calc non Af Amer >90  >90 mL/min   GFR calc Af Amer >90  >90 mL/min   Comment: (NOTE)     The  eGFR has been calculated using the CKD EPI equation.     This calculation has not been validated in all clinical situations.     eGFR's persistently <90 mL/min signify possible Chronic Kidney     Disease.  CBC     Status: Abnormal   Collection Time    08/30/13  3:20 AM      Result Value Ref Range   WBC 6.8  4.0 - 10.5 K/uL   RBC 4.12  3.87 - 5.11 MIL/uL   Hemoglobin 9.6 (*) 12.0 - 15.0 g/dL   HCT 32.3 (*) 36.0 - 46.0 %   MCV 78.4  78.0 - 100.0 fL   MCH 23.3 (*) 26.0 - 34.0 pg   MCHC 29.7 (*) 30.0 - 36.0 g/dL   RDW 21.8 (*) 11.5 - 15.5 %   Platelets 175  150 - 400 K/uL  HIV ANTIBODY (ROUTINE TESTING)     Status: None   Collection Time    08/30/13  3:20 AM      Result Value Ref Range   HIV NON REACTIVE  NON REACTIVE   Comment: (NOTE)     Effective September 19, 2013, [employer] will no longer offer the     current 3rd Generation HIV diagnostic screening assay, HIV Antibodies,     HIV-1/2 EIA, with reflexes. At that time, [employer] will     only offer HIV-1/2 Ag/Ab, 4th Gen, w/ Reflexes as recommended by the     CDC. This HIV diagnostic screening assay tests for antibodies to HIV-1     and HIV-2 as well as HIV p24 antigen and provides greater sensitivity     for the detection of recent infection. Any orders for the 3rd     Generation assay will automatically be referred to the 4th Generation  assay.     Performed at [facility], CAPILLARY     Status: Abnormal   Collection Time    08/30/13  7:50 AM      Result Value Ref Range   Glucose-Capillary 189 (*) 70 - 99 mg/dL  GLUCOSE, CAPILLARY     Status: Abnormal   Collection Time    08/30/13 11:53 AM      Result Value Ref Range   Glucose-Capillary 190 (*) 70 - 99 mg/dL      Component Value Date/Time   SDES URINE, CLEAN CATCH 08/29/2013 2049   [place] NONE 08/29/2013 2049   CULT  Value: CANDIDA ALBICANS Performed at [facility] 08/15/2013 1800   CULT  Value: NORMAL OROPHARYNGEAL  FLORA Performed at [facility] 08/15/2013 1800   REPTSTATUS 08/30/2013 FINAL 08/29/2013 2049   Dg Abd 1 View  08/29/2013   CLINICAL DATA:  Abdominal pain, constipation  EXAM: ABDOMEN - 1 VIEW  COMPARISON:  None.  FINDINGS: There is nonspecific nonobstructive bowel gas pattern. Moderate stool noted in right colon. Some gas noted in transverse colon.  IMPRESSION: Nonspecific nonobstructive bowel gas pattern. Moderate stool noted in right colon.   Electronically Signed   By: Lahoma Crocker M.D.   On: 08/29/2013 15:51   Ct Angio Chest W/cm &/or Wo Cm  08/29/2013   CLINICAL DATA:  COPD and pneumonia  EXAM: CT ANGIOGRAPHY CHEST WITH CONTRAST  TECHNIQUE: Multidetector CT imaging of the chest was performed using the standard protocol during bolus administration of intravenous contrast. Multiplanar CT image reconstructions and MIPs were obtained to evaluate the vascular anatomy.  CONTRAST:  150m OMNIPAQUE IOHEXOL 350 MG/ML SOLN  COMPARISON:  DG CHEST 1V PORT dated 08/29/2013; CT CHEST W/O CM dated 08/10/2013; DG CHEST 2 VIEW dated 04/29/2013; CT CHEST W/CM dated 10/19/2012  FINDINGS: There are no filling defects in the pulmonary arterial tree to suggest acute pulmonary thromboembolism.  No evidence of aortic dissection. Maximal ascending aortic diameter is 3.7 cm. Atherosclerotic changes at the origin of the left common carotid artery and left subclavian artery are noted. Left ventricular hypertrophy is present.  No significant pericardial effusion is present. Stable coronary artery calcifications. Small mediastinal nodes are stable.  Small left pleural effusion is stable and is probably loculated.  Fibrotic lung disease within apical prep collection is stable. Scattered patchy opacities in the lower lungs are also stable. Confluent opacity in the superior segment of the left lower lobe is also stable. Severe emphysema.  No pneumothorax.  No acute bony deformity.  Round soft tissue density in the left breast is stable.   Review of the MIP images confirms the above findings.  IMPRESSION: No evidence of acute pulmonary thromboembolism.  Chronic lung disease.  Stable confluent pulmonary opacity in the superior segment of the left lower lobe previously characterized as pneumonia.   Electronically Signed   By: AMaryclare BeanM.D.   On: 08/29/2013 17:16   Dg Chest Port 1 View  08/29/2013   CLINICAL DATA:  Shortness of breath, asthma, emphysema  EXAM: PORTABLE CHEST - 1 VIEW  COMPARISON:  08/25/2013 and 08/13/2013  FINDINGS: Cardiomediastinal silhouette is stable. Extensive scarring and volume loss bilateral upper lobe again noted. Stable emphysematous changes. Again noted hilar retraction bilaterally. No definite new consolidation is noted. Stable airspace opacities in upper lobes.  IMPRESSION: Extensive scarring and volume loss bilateral upper lobe again noted. Stable emphysematous changes. Again noted hilar retraction bilaterally. No definite new consolidation is noted. Stable  airspace opacities in upper lobes.   Electronically Signed   By: Lahoma Crocker M.D.   On: 08/29/2013 13:23   Recent Results (from the past 240 hour(s))  MRSA PCR SCREENING     Status: Abnormal   Collection Time    08/29/13  8:49 PM      Result Value Ref Range Status   MRSA by PCR INVALID RESULTS, SPECIMEN SENT FOR CULTURE (*) NEGATIVE Final   Comment: RESULT CALLED TO, READ BACK BY AND VERIFIED WITH:     ASMITH RN AT 0120 ON [id number] BY DLONG                The GeneXpert MRSA Assay (FDA     approved for NASAL specimens     only), is one component of a     comprehensive MRSA colonization     surveillance program. It is not     intended to diagnose MRSA     infection nor to guide or     monitor treatment for     MRSA infections.      08/30/2013, 3:57 PM     LOS: 1 day

## 2013-08-30 NOTE — Progress Notes (Signed)
INITIAL NUTRITION ASSESSMENT  DOCUMENTATION CODES Per approved criteria  -Obesity Grade 1   INTERVENTION:  Continue Ensure Complete bid  High Protein Snack once daily.  Reviewed hospital menu and items allowed on current diet.  Took meal orders.  Noted continued hospice discussion.  RD to follow plan of care.  NUTRITION DIAGNOSIS: Inadequate oral intake related to poor appetite as evidenced by patient report.   Goal: Maximize intake  Monitor:  Plan of care, intake, labs, weight trend  Reason for Assessment: MST  63 y.o. female  Admitting Dx: <principal problem not specified>  ASSESSMENT: Patient with severe emphysema/COPD complicated to rheumatoid arthritis related lung disease.  PNA. Noted overall prognosis from a pulmonary perspective is poor.    Patient was last seen by an inpatient RD 08/11/12.  Met with patient and daughter today.  Patient reports a poor appetite since the beginning of February.  Usually only eats 2 meals per day (a bagel for breakfast and vegetables later in the day).  Has not been eating much meat or other protein rich foods.  Drank 1 Ensure daily (It is too expensive.)  Weight has decreased from 209 lbs last admit.  Some of weight loss probably secondary to fluid loss.  Current intake has been inadequate.    Height: Ht Readings from Last 1 Encounters:  08/29/13 $RemoveB'5\' 4"'WGXTihdv$  (1.626 m)    Weight: Wt Readings from Last 1 Encounters:  08/30/13 199 lb 15.3 oz (90.7 kg)    Ideal Body Weight: 120 lbs  % Ideal Body Weight: 166  Wt Readings from Last 10 Encounters:  08/30/13 199 lb 15.3 oz (90.7 kg)  08/25/13 203 lb (92.08 kg)  08/14/13 211 lb 10.3 oz (96 kg)  07/07/13 216 lb (97.977 kg)  06/22/13 212 lb 12.8 oz (96.525 kg)  06/13/13 208 lb (94.348 kg)  05/30/13 210 lb (95.255 kg)  05/13/13 218 lb 3.2 oz (98.975 kg)  05/03/13 215 lb (97.523 kg)  04/29/13 212 lb 9.6 oz (96.435 kg)    Usual Body Weight: 209 lbs  % Usual Body Weight: 95  BMI:   Body mass index is 34.31 kg/(m^2).  Estimated Nutritional Needs: Kcal: 1400-1500 Protein: 90-100 gm Fluid: 1.5L FR   Skin: wnl  Diet Order: Cardiac  EDUCATION NEEDS: -Education needs addressed   Intake/Output Summary (Last 24 hours) at 08/30/13 1155 Last data filed at 08/30/13 1000  Gross per 24 hour  Intake    740 ml  Output    850 ml  Net   -110 ml     Labs:   Recent Labs Lab 08/23/13 1213 08/29/13 1308 08/30/13 0320  NA 139 127* 130*  K 3.9 4.8 4.3  CL 99 82* 90*  CO2 $Re'29 31 29  'VTJ$ BUN $R'14 18 13  'wH$ CREATININE 0.81 0.80 0.63  CALCIUM 8.5 9.8 9.0  MG 1.8  --   --   GLUCOSE 159* 173* 174*    CBG (last 3)   Recent Labs  08/29/13 2149 08/30/13 0750  GLUCAP 187* 189*    Scheduled Meds: . allopurinol  100 mg Oral Daily  . docusate sodium  100 mg Oral BID  . enoxaparin (LOVENOX) injection  40 mg Subcutaneous Q24H  . feeding supplement (ENSURE COMPLETE)  237 mL Oral BID BM  . furosemide  40 mg Oral Daily  . imipenem-cilastatin  500 mg Intravenous 4 times per day  . insulin aspart  0-15 Units Subcutaneous TID WC  . ipratropium-albuterol  3 mL Nebulization QID  . leflunomide  20 mg Oral Daily  . levothyroxine  50 mcg Oral QAC breakfast  . loratadine  10 mg Oral Daily  . methylPREDNISolone (SOLU-MEDROL) injection  125 mg Intravenous Daily  . mometasone-formoterol  2 puff Inhalation BID  . montelukast  10 mg Oral Daily  . pantoprazole  40 mg Oral Daily  . potassium chloride SA  60 mEq Oral Daily  . sodium chloride  3 mL Intravenous Q12H  . vancomycin  1,000 mg Intravenous Q8H  . voriconazole  200 mg Oral Q12H    Continuous Infusions:   Past Medical History  Diagnosis Date  . Asthma   . Emphysema     02 dependent 3L/min  . Emphysema   . Pneumonia   . Hypothyroidism   . Diabetes mellitus, type II   . Obesity   . Diastolic heart failure 0/02/4708    Grade 1  . Emphysema   . Rheumatoid arthritis(714.0)   . Hyperlipidemia   . Hypertension   .  Vitamin D deficiency     Past Surgical History  Procedure Laterality Date  . Tubal ligation    . Colonoscopy    . Video bronchoscopy N/A 11/18/2012    Procedure: VIDEO BRONCHOSCOPY WITH FLUORO;  Surgeon: Elsie Stain, MD;  Location: [facility];  Service: Cardiopulmonary;  Laterality: N/A;  . Eye surgery  at age 46    Antonieta Iba, RD, LDN Clinical Inpatient Dietitian Pager:  [phone] Weekend and after hours pager:  [phone]

## 2013-08-30 NOTE — Progress Notes (Signed)
[facility]  Patient Status: Active (receiving services up to time of hospitalization)  [facility] is providing the following services: RN, PT, OT and HHA  If patient discharges after hours, please call [phone].   Lanae Crumbly 08/30/2013, 2:32 PM

## 2013-08-30 NOTE — Progress Notes (Signed)
PT Cancellation Note  Patient Details Name: Jacqueline Washington MRN: [id number] DOB: [birth year]   Cancelled Treatment:    Reason Eval/Treat Not Completed: Medical issues which prohibited therapy. Will check on 3/18 AM. GOC being considered.    Rada Hay 08/30/2013, 4:37 PM Blanchard Kelch PT [phone]

## 2013-08-30 NOTE — Progress Notes (Signed)
ANTIBIOTIC CONSULT NOTE - INITIAL  Pharmacy Consult for Vancomycin, Primaxin Indication: pneumonia   Allergies  Allergen Reactions  . Chantix [Varenicline] Other (See Comments)    Unknown " blurry vision"  . Hctz [Hydrochlorothiazide] Other (See Comments)    unknown  . Penicillins Rash  . Sulfonamide Derivatives Rash  . Tetanus Toxoid Other (See Comments)    Knot on arm    Patient Measurements: Height: 5\' 4"  (162.6 cm) Weight: 199 lb 15.3 oz (90.7 kg) IBW/kg (Calculated) : 54.7 Last documented weight 92kg and height 163 cm (08/25/13)  Vital Signs: Temp: 97.7 F (36.5 C) (03/17 1200) Temp src: Oral (03/17 1200) BP: 147/82 mmHg (03/17 1200) Pulse Rate: 120 (03/17 1200)  Labs:  Recent Labs  08/29/13 1308 08/30/13 0320  WBC 10.1 6.8  HGB 11.3* 9.6*  PLT 218 175  CREATININE 0.80 0.63   Estimated Creatinine Clearance: 79.5 ml/min (by C-G formula based on Cr of 0.63).   Microbiology: Recent Results (from the past 720 hour(s))  CULTURE, BLOOD (ROUTINE X 2)     Status: None   Collection Time    08/10/13  8:35 PM      Result Value Ref Range Status   Specimen Description BLOOD LEFT WRIST   Final   Special Requests BOTTLES DRAWN AEROBIC AND ANAEROBIC 2.5ML   Final   Culture  Setup Time     Final   Value: 08/11/2013 01:18     Performed at [facility]   Culture     Final   Value: NO GROWTH 5 DAYS     Performed at [facility]   Report Status 08/17/2013 FINAL   Final  MRSA PCR SCREENING     Status: Abnormal   Collection Time    08/10/13 11:40 PM      Result Value Ref Range Status   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 It is not     intended to diagnose MRSA     infection nor to guide or     monitor treatment for     MRSA infections.     RESULT CALLED TO, READ  BACK BY AND VERIFIED WITH:     SPOKE WITH AYERS,C RN [phone] [place],N  CULTURE, BLOOD (ROUTINE X 2)     Status: None   Collection Time    08/10/13 11:45 PM      Result Value Ref Range Status   Specimen Description BLOOD LEFT HAND   Final   Special Requests BOTTLES DRAWN AEROBIC AND ANAEROBIC 5CC   Final   Culture  Setup Time     Final   Value: 08/11/2013 04:06     Performed at [facility]   Culture     Final   Value: NO GROWTH 5 DAYS     Performed at [facility]   Report Status 08/17/2013 FINAL   Final  FUNGUS CULTURE W SMEAR     Status: None   Collection Time    08/15/13  6:00 PM      Result Value Ref Range Status   Specimen Description SPUTUM   Final   Special Requests Immunocompromised   Final   Fungal Smear  Final   Value: NO YEAST OR FUNGAL ELEMENTS SEEN     Performed at [facility]   Culture     Final   Value: CANDIDA ALBICANS     Performed at [facility]   Report Status PENDING   Incomplete  CULTURE, EXPECTORATED SPUTUM-ASSESSMENT     Status: None   Collection Time    08/15/13  6:00 PM      Result Value Ref Range Status   Specimen Description SPUTUM   Final   Special Requests NONE   Final   Sputum evaluation     Final   Value: THIS SPECIMEN IS ACCEPTABLE. RESPIRATORY CULTURE REPORT TO FOLLOW.   Report Status 08/15/2013 FINAL   Final  CULTURE, RESPIRATORY (NON-EXPECTORATED)     Status: None   Collection Time    08/15/13  6:00 PM      Result Value Ref Range Status   Specimen Description SPUTUM   Final   Special Requests NONE   Final   Gram Stain     Final   Value: FEW WBC PRESENT,BOTH PMN AND MONONUCLEAR     RARE SQUAMOUS EPITHELIAL CELLS PRESENT     FEW GRAM POSITIVE COCCI IN PAIRS     FEW GRAM NEGATIVE RODS     Performed at [facility]   Culture     Final   Value: NORMAL OROPHARYNGEAL FLORA     Performed at [facility]   Report Status 08/18/2013 FINAL   Final  MRSA PCR SCREENING     Status:  Abnormal   Collection Time    08/29/13  8:49 PM      Result Value Ref Range Status   MRSA by PCR INVALID RESULTS, SPECIMEN SENT FOR CULTURE (*) NEGATIVE Final   Comment: RESULT CALLED TO, READ BACK BY AND VERIFIED WITH:     ASMITH RN AT 0120 ON [id number] BY DLONG                The GeneXpert MRSA Assay (FDA     approved for NASAL specimens     only), is one component of a     comprehensive MRSA colonization     surveillance program. It is not     intended to diagnose MRSA     infection nor to guide or     monitor treatment for     MRSA infections.    Medical History: Past Medical History  Diagnosis Date  . Asthma   . Emphysema     02 dependent 3L/min  . Emphysema   . Pneumonia   . Hypothyroidism   . Diabetes mellitus, type II   . Obesity   . Diastolic heart failure 09/22/2012    Grade 1  . Emphysema   . Rheumatoid arthritis(714.0)   . Hyperlipidemia   . Hypertension   . Vitamin D deficiency     Medications:  Anti-infectives   Start     Dose/Rate Route Frequency Ordered Stop   08/29/13 2200  voriconazole (VFEND) tablet 200 mg     200 mg Oral Every 12 hours 08/29/13 2042     08/29/13 2000  vancomycin (VANCOCIN) IVPB 1000 mg/200 mL premix     1,000 mg 200 mL/hr over 60 Minutes Intravenous Every 8 hours 08/29/13 1851     08/29/13 2000  imipenem-cilastatin (PRIMAXIN) 500 mg in sodium chloride 0.9 % 100 mL IVPB     500 mg 200 mL/hr over 30 Minutes Intravenous 4 times per  day 08/29/13 1851       Assessment: 44 yoF [facility] with abdominal and chest pain and SOB, now with acute on chronic respiratory failure in setting of RA related pulmonary fibrosis, underlying emphysema, and progressive fibrocavitary lung disease with HCAP.  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  Pharmacy is consulted to dose vancomycin and Primaxin. Voriconazole recently started for aspergillosis, also resumed.   D2  Antibiotics PTA >> voriconazole >>  3/16 >> Vanc >> 3/16 >> Primaxin >>    Tmax: Afebrile  WBCs: now WNL  Renal: SCr 0.63, CrCl 80 ml/min (CrCl ~82 ml/min Normalized)  Antibiotics on previous admission (2/25 - 3/3 ) included vanc/aztreonam transitioned to levofloxacin and voriconazole was added for r/o pulmonary Aspergillosis.  2/25 Aspergillus galactomannan Ag not detected.  3/16 Respiratory viral panel: collected 3/16 blood: collected 3/16 MRSA culture: collected 3/16 Strep pneumo + / legionella pending Fungus culture with smear: ordered / urine:  / sputum: ordered  Goal of Therapy:  Vancomycin trough level 15-20 mcg/ml Eradication of infection  Plan:   Continue Primaxin 500mg  IV q6h and Vancomycin 1000 mg IV q8h.  Measure Vanc trough at steady state tonight  Follow up renal fxn and culture results.  , PharmD, BCPS 08/30/2013, 2:33 PM  Pager: [phone]

## 2013-08-31 ENCOUNTER — Other Ambulatory Visit: Payer: Self-pay

## 2013-08-31 ENCOUNTER — Encounter (HOSPITAL_COMMUNITY): Payer: Self-pay | Admitting: *Deleted

## 2013-08-31 DIAGNOSIS — J984 Other disorders of lung: Secondary | ICD-10-CM

## 2013-08-31 DIAGNOSIS — J471 Bronchiectasis with (acute) exacerbation: Secondary | ICD-10-CM

## 2013-08-31 DIAGNOSIS — Z66 Do not resuscitate: Secondary | ICD-10-CM

## 2013-08-31 LAB — EXPECTORATED SPUTUM ASSESSMENT W REFEX TO RESP CULTURE

## 2013-08-31 LAB — BASIC METABOLIC PANEL
BUN: 15 mg/dL (ref 6–23)
CO2: 35 meq/L — AB (ref 19–32)
Calcium: 9.2 mg/dL (ref 8.4–10.5)
Chloride: 95 mEq/L — ABNORMAL LOW (ref 96–112)
Creatinine, Ser: 0.73 mg/dL (ref 0.50–1.10)
GFR calc Af Amer: >90 mL/min (ref >90)
GFR calc non Af Amer: 90 mL/min — ABNORMAL LOW (ref >90)
GLUCOSE: 128 mg/dL — AB (ref 70–99)
Potassium: 4.4 mEq/L (ref 3.7–5.3)
SODIUM: 139 meq/L (ref 137–147)

## 2013-08-31 LAB — CBC
HCT: 30.9 % — ABNORMAL LOW (ref 36.0–46.0)
HEMOGLOBIN: 8.8 g/dL — AB (ref 12.0–15.0)
MCH: 23 pg — AB (ref 26.0–34.0)
MCHC: 28.5 g/dL — ABNORMAL LOW (ref 30.0–36.0)
MCV: 80.9 fL (ref 78.0–100.0)
Platelets: 172 10*3/uL (ref 150–400)
RBC: 3.82 MIL/uL — ABNORMAL LOW (ref 3.87–5.11)
RDW: 22.3 % — ABNORMAL HIGH (ref 11.5–15.5)
WBC: 12.5 10*3/uL — AB (ref 4.0–10.5)

## 2013-08-31 LAB — VANCOMYCIN, TROUGH: Vancomycin Tr: 25.6 ug/mL (ref 10.0–20.0)

## 2013-08-31 LAB — GLUCOSE, CAPILLARY
GLUCOSE-CAPILLARY: 200 mg/dL — AB (ref 70–99)
GLUCOSE-CAPILLARY: 171 mg/dL — AB (ref 70–99)
GLUCOSE-CAPILLARY: 173 mg/dL — AB (ref 70–99)
Glucose-Capillary: 167 mg/dL — ABNORMAL HIGH (ref 70–99)

## 2013-08-31 LAB — TROPONIN I

## 2013-08-31 LAB — EXPECTORATED SPUTUM ASSESSMENT W GRAM STAIN, RFLX TO RESP C

## 2013-08-31 MED ORDER — VANCOMYCIN HCL 10 G IV SOLR
1250.0000 mg | Freq: Two times a day (BID) | INTRAVENOUS | Status: DC
  Administered 2013-08-31 – 2013-09-02 (×4): 1250 mg via INTRAVENOUS
  Filled 2013-08-31 (×5): qty 1250

## 2013-08-31 MED ORDER — AMLODIPINE BESYLATE 5 MG PO TABS
5.0000 mg | ORAL_TABLET | Freq: Every day | ORAL | Status: DC
  Administered 2013-08-31 – 2013-09-01 (×2): 5 mg via ORAL
  Filled 2013-08-31 (×2): qty 1

## 2013-08-31 MED ORDER — LISINOPRIL 10 MG PO TABS
10.0000 mg | ORAL_TABLET | Freq: Every day | ORAL | Status: DC
  Administered 2013-09-01: 10 mg via ORAL
  Filled 2013-08-31 (×2): qty 1

## 2013-08-31 MED ORDER — HYDRALAZINE HCL 20 MG/ML IJ SOLN: 10.0000 mg | Freq: Four times a day (QID) | INTRAMUSCULAR | Status: DC | PRN

## 2013-08-31 MED ORDER — SORBITOL 70 % SOLN
960.0000 mL | TOPICAL_OIL | Freq: Once | ORAL | Status: DC
  Filled 2013-08-31: qty 240

## 2013-08-31 MED ORDER — DIPHENHYDRAMINE HCL 50 MG/ML IJ SOLN
25.0000 mg | Freq: Once | INTRAMUSCULAR | Status: AC
  Administered 2013-08-31: 25 mg via INTRAVENOUS
  Filled 2013-08-31: qty 1

## 2013-08-31 NOTE — Consult Note (Signed)
Patient Jacqueline Washington      DOB: Jan 17, 1951      NGI:[id number]  Summary of Symptom management consult; daughter present ( wants to remeet at 13 am Thursday)   Met with patient who displays significant anxiety and need to be in control of situation and circumstances which is understandable. There are also trust issues which she openly admits that she only trusts Dr. Joya Gaskins who has been her long time physician.  She brought up the topic with me while I was assessing her symptom needs so we did further talk about what hospice dose for people in a general sense.  She reports that she does not like the morphine as it makes her feel funny, but she also did not state that she wanted to stop it. The best thing that happened to her today was that the charge nurse prayed with her.  She relates that when the Reita Cliche is involved he always gives her Peace.  She was open to a spiritual care consult , but was not open earlier to using and antianxiety medication when offered by the pulmonary team.  We were able to establish that she is having most difficulty with constipation, that she did have "several anxiety attacks today( known triggers for anxiety attacks are having to have blood drawn or IVs placed per daughter).  Patient willing to talk some more tomorrow at  930 am with daughter present.   Recommend: 1.  DNR affirmed in chart but not with patient  2.  Constipation: Noted enema ordered.  Would recommend Scheduling Senna along with Colace ( Colace may be less effective than senna per recent studies, but some folks do well with it.) Add miralax once moving from below. If ineffective in next 24 hours consider dose of Relistor .    Time : 330-400 pm  Rilyn Upshaw L. Lovena Le, MD MBA The Palliative Medicine Team at [facility] Phone: [phone] Pager: [phone]

## 2013-08-31 NOTE — Progress Notes (Signed)
Pt is eating breakfast and C/O "throat closing" and difficulty swallowing water."  Bilateral wheezing. RR 38. Administer am solumedrol and lasix. Some improvement within 5-10 minutes.

## 2013-08-31 NOTE — Progress Notes (Signed)
Patient Jacqueline Washington      DOB: 12/03/1950      FKC:[id number]  Palliative Medicine Consult received.  Will be seeing patient later today as quickly as we can.  Thank you for your patience. Angeligue Bowne L. Ladona Ridgel, MD MBA The Palliative Medicine Team at [facility] Phone: [phone] Pager: [phone]

## 2013-08-31 NOTE — Progress Notes (Signed)
Rx Brief Antibiotic note:  IV Vancomycin  Assessement:  VT=25.6 mg/l after 1Gm q8h @ Css-above desired goal 15-20mg /l  SCr stable 0.80>0.63>0.73, CrCl~66 (N)  Afebrile  Plan:  Change Vancomycin to 1250mg  IV q12h next @ 1800  F/u SCr/levels/cultures as needed  08/31/2013 6:22 AM

## 2013-08-31 NOTE — Consult Note (Deleted)
PULMONARY / CRITICAL CARE MEDICINE   Name: Jacqueline Washington MRN: [id number] DOB: 12-21-1950    ADMISSION DATE:  08/29/2013 CONSULTATION DATE:  3/17  REFERRING MD :  Philip Aspen PRIMARY SERVICE: TRH  CHIEF COMPLAINT:  Right shoulder and side pain  BRIEF PATIENT DESCRIPTION: 63 y/o female with a complex pulmonary history including COPD and rheumatoid arthritis related lung disease was re-admitted on 3/16 with right shoulder pain and increasing shortness of breath.  SIGNIFICANT EVENTS / STUDIES:  3/16 - CT Angio chest > no pe, severe bilateral emphysema, left effusion unchanged, ? 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; several areas of frank consolidation RUL and LUL  LINES / TUBES: R IJ Tunneled Dual Lumen 3/17>>>  CULTURES: 3/17 sputum bacterial> 3/17 sputum fungal >  ANTIBIOTICS: 3/16 vanc >> 3/16 imipenem>> VFend (pre-admission) >>3/17   SUBJECTIVE:   VITAL SIGNS: Temp:  [97.7 F (36.5 C)-98.6 F (37 C)] 98.6 F (37 C) (03/18 0347) Pulse Rate:  [100-127] 110 (03/18 0600) Resp:  [12-41] 28 (03/18 0600) BP: (118-183)/(78-110) 183/97 mmHg (03/18 0600) SpO2:  [94 %-100 %] 98 % (03/18 0805) Weight:  [203 lb 7.8 oz (92.3 kg)] 203 lb 7.8 oz (92.3 kg) (03/18 0347)  HEMODYNAMICS:   VENTILATOR SETTINGS:    INTAKE / OUTPUT: Intake/Output     03/17 0701 - 03/18 0700 03/18 0701 - 03/19 0700   I.V. (mL/kg) 140 (1.5)    IV Piggyback 1100    Total Intake(mL/kg) 1240 (13.4)    Urine (mL/kg/hr) 2550 (1.2)    Total Output 2550     Net -1310            PHYSICAL EXAMINATION: Gen: chronically ill appearing, tachypnic but no acute distress HEENT: NCAT, EOMi, OP clear,  PULM: Crackles bilaterally, few exp wheezes CV: RRR, no mgr, no JVD AB: BS+, soft, mildly tender RUQ, no hsm Ext: warm, trace edema, no clubbing, no cyanosis Derm: no rash or skin breakdown Neuro: A&Ox4, CN II-XII intact, MAEW   LABS:  CBC  Recent Labs Lab 08/29/13 1308 08/30/13 0320  08/31/13 0345  WBC 10.1 6.8 12.5*  HGB 11.3* 9.6* 8.8*  HCT 37.9 32.3* 30.9*  PLT 218 175 172   Coag's No results found for this basename: APTT, INR,  in the last 168 hours BMET  Recent Labs Lab 08/29/13 1308 08/30/13 0320 08/31/13 0345  NA 127* 130* 139  K 4.8 4.3 4.4  CL 82* 90* 95*  CO2 31 29 35*  BUN 18 13 15   CREATININE 0.80 0.63 0.73  GLUCOSE 173* 174* 128*   Electrolytes  Recent Labs Lab 08/29/13 1308 08/30/13 0320 08/31/13 0345  CALCIUM 9.8 9.0 9.2   ABG No results found for this basename: PHART, PCO2ART, PO2ART,  in the last 168 hours Liver Enzymes  Recent Labs Lab 08/29/13 1308  AST 36  ALT 33  ALKPHOS 108  BILITOT 0.5  ALBUMIN 3.4*   Cardiac Enzymes No results found for this basename: TROPONINI, PROBNP,  in the last 168 hours Glucose  Recent Labs Lab 08/29/13 2149 08/30/13 0750 08/30/13 1153 08/30/13 1841 08/30/13 2112  GLUCAP 187* 189* 190* 167* 200*    Imaging  See above  ASSESSMENT / PLAN:  PULMONARY A: COPD (2013 FEV1 1.23 L, 54% pred) with severe emphysema complicated by rheumatoid arthritis related lung disease.   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  The only way to confirm this would be to perform a biopsy which is too dangerous.  Regardless the definitive diagnosis, her overall prognosis from a pulmonary perspective is poor.    P:   -agree with increased steroid dose for organizing pneumonia -will send sputum culture for fungus -agree with empiric HCAP coverage -continue bronchodilators -wean O2 as indicated -consult palliative care for assistance with narcotics for relief of dyspnea -VFend discontinued  GASTROINTESTINAL A:   R shoulder pain> is this referred pain from gall bladder or constipation? Constipation from narcotics P:   -RUQ ultrasound -bowel regimen -consult palliative care to  help [place] with symptom management    INFECTIOUS A:   HCAP? P:   -agree with empiric antibiotic coverage -f/u cultures -will need to re-visit port placement at some point (was to have one placed but held due to concern for acute infection)   Global: Palliative Medicine consult called per Dr. Delford Field for EOL issues and dyspnea relief.   Canary Brim, NP-C [facility] Pgr: [phone] or [phone]  08/31/2013, 8:55 AM

## 2013-08-31 NOTE — Progress Notes (Signed)
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 -On Vanc and primaxin for ?HCAP. 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 D/c'd on 3/17 -Appreciate pulmonary input._0 - PMT consult pending  Right Shoulder Pain -Suspected diaphragmatic pain from constipation-->started bowel regimen.  HCAP as above Pulmonary Aspergillosis as above. Biopsy to confirm would be too invasive.  HTN Poorly controlled. Possibly made worse by high dose steroids. Will increase PRN hydralazine dose to 81m prn. Consider adding ACEI  Code Status: DNR Family Communication: Pt in room Disposition Plan: Pending - Pending PMT consult   Consultants:  Pulmonary   Antibiotics:  Vanc 3/16>>>  imipenem  3/16>>>  voriconazole (antifungal) 3/16>>>3/17  Subjective: No acute events overnight. Pt reports breathing well this AM  Objective: Filed Vitals:   08/31/13 0347 08/31/13 0400 08/31/13 0500 08/31/13 0600  BP:  160/110 164/81 183/97  Pulse:  108 100 110  Temp: 98.6 F (37 C)     TempSrc: Oral     Resp:  _1 Height:      Weight: 92.3 kg (203 lb 7.8 oz)     SpO2:  98% 99% 98%    Intake/Output Summary (Last 24 hours) at 08/31/13 0759 Last data filed at 08/31/13 0600  Gross per 24 hour  Intake   1140 ml  Output   2550 ml  Net  -1410 ml   Filed Weights   08/29/13 2045 08/30/13 0145 08/31/13 0347  Weight: 90.674 kg (199 lb 14.4 oz) 90.7 kg (199 lb 15.3 oz) 92.3 kg (203 lb 7.8 oz)    Exam:   General:  AA Ox3  Cardiovascular: RRR  Respiratory: wheezing on R with B decreased BS  Abdomen: S/NT/ND/+BS  Extremities: trace bilateral edema   Neurologic:  Non-focal  Data  Reviewed: Basic Metabolic Panel:  Recent Labs Lab 08/29/13 1308 08/30/13 0320 08/31/13 0345  NA 127* 130* 139  K 4.8 4.3 4.4  CL 82* 90* 95*  CO2 31 29 35*  GLUCOSE 173* 174* 128*  BUN _2 CREATININE 0.80 0.63 0.73  CALCIUM 9.8 9.0 9.2   Liver Function Tests:  Recent Labs Lab 08/29/13 1308  AST 36  ALT 33  ALKPHOS 108  BILITOT 0.5  PROT 7.4  ALBUMIN 3.4*    Recent Labs Lab 08/29/13 1308  LIPASE 30   No results found for this basename: AMMONIA,  in the last 168 hours CBC:  Recent Labs Lab 08/29/13 1308 08/30/13 0320 08/31/13 0345  WBC 10.1 6.8 12.5*  HGB 11.3* 9.6* 8.8*  HCT 37.9 32.3* 30.9*  MCV 78.3 78.4 80.9  PLT 218 175 172   Cardiac Enzymes: No results found for this basename: CKTOTAL, CKMB, CKMBINDEX, TROPONINI,  in the last 168 hours BNP (last 3 results)  Recent Labs  03/25/13 0601 04/14/13 2300 08/10/13 1554  PROBNP 1150.0* 301.6* 1099.0*   CBG:  Recent Labs Lab 08/29/13 2149 08/30/13 0750 08/30/13 1153 08/30/13 1841 08/30/13 2112  GLUCAP 187* 189* 190* 167* 200*    Recent Results (from the past 240 hour(s))  MRSA PCR SCREENING     Status: Abnormal   Collection Time    08/29/13  8:49 PM      Result Value Ref Range Status   MRSA by PCR INVALID RESULTS, SPECIMEN SENT  FOR CULTURE (*) NEGATIVE Final   Comment: RESULT CALLED TO, READ BACK BY AND VERIFIED WITH:     ASMITH RN AT 0120 ON [id number] BY DLONG                The GeneXpert MRSA Assay (FDA     approved for NASAL specimens     only), is one component of a     comprehensive MRSA colonization     surveillance program. It is not     intended to diagnose MRSA     infection nor to guide or     monitor treatment for     MRSA infections.  RESPIRATORY VIRUS PANEL     Status: None   Collection Time    08/29/13  9:32 PM      Result Value Ref Range Status   Source - RVPAN NOSE   Final   Respiratory Syncytial Virus A NOT DETECTED   Final   Respiratory Syncytial Virus B  NOT DETECTED   Final   Influenza A NOT DETECTED   Final   Influenza B NOT DETECTED   Final   Parainfluenza 1 NOT DETECTED   Final   Parainfluenza 2 NOT DETECTED   Final   Parainfluenza 3 NOT DETECTED   Final   Metapneumovirus NOT DETECTED   Final   Rhinovirus NOT DETECTED   Final   Adenovirus NOT DETECTED   Final   Influenza A H1 NOT DETECTED   Final   Influenza A H3 NOT DETECTED   Final   Comment: (NOTE)           Normal Reference Range for each Analyte: NOT DETECTED     Testing performed using the Luminex xTAG Respiratory Viral Panel test     kit.     This test was developed and its performance characteristics determined     by [facility]. It has not been cleared or approved by the [place]     Food and Drug Administration. This test is used for clinical purposes.     It should not be regarded as investigational or for research. This     laboratory is certified under the [place] (CLIA) as qualified to perform high complexity     clinical laboratory testing.     Performed at [facility]     Studies: Dg Abd 1 View  08/29/2013   CLINICAL DATA:  Abdominal pain, constipation  EXAM: ABDOMEN - 1 VIEW  COMPARISON:  None.  FINDINGS: There is nonspecific nonobstructive bowel gas pattern. Moderate stool noted in right colon. Some gas noted in transverse colon.  IMPRESSION: Nonspecific nonobstructive bowel gas pattern. Moderate stool noted in right colon.   Electronically Signed   By: Lahoma Crocker M.D.   On: 08/29/2013 15:51   Ct Angio Chest W/cm &/or Wo Cm  08/29/2013   CLINICAL DATA:  COPD and pneumonia  EXAM: CT ANGIOGRAPHY CHEST WITH CONTRAST  TECHNIQUE: Multidetector CT imaging of the chest was performed using the standard protocol during bolus administration of intravenous contrast. Multiplanar CT image reconstructions and MIPs were obtained to evaluate the vascular anatomy.  CONTRAST:  175m OMNIPAQUE IOHEXOL 350 MG/ML SOLN   COMPARISON:  DG CHEST 1V PORT dated 08/29/2013; CT CHEST W/O CM dated 08/10/2013; DG CHEST 2 VIEW dated 04/29/2013; CT CHEST W/CM dated 10/19/2012  FINDINGS: There are no filling defects in the pulmonary arterial tree to suggest acute pulmonary thromboembolism.  No evidence of aortic dissection. Maximal ascending aortic diameter is 3.7 cm. Atherosclerotic changes at the origin of the left common carotid artery and left subclavian artery are noted. Left ventricular hypertrophy is present.  No significant pericardial effusion is present. Stable coronary artery calcifications. Small mediastinal nodes are stable.  Small left pleural effusion is stable and is probably loculated.  Fibrotic lung disease within apical prep collection is stable. Scattered patchy opacities in the lower lungs are also stable. Confluent opacity in the superior segment of the left lower lobe is also stable. Severe emphysema.  No pneumothorax.  No acute bony deformity.  Round soft tissue density in the left breast is stable.  Review of the MIP images confirms the above findings.  IMPRESSION: No evidence of acute pulmonary thromboembolism.  Chronic lung disease.  Stable confluent pulmonary opacity in the superior segment of the left lower lobe previously characterized as pneumonia.   Electronically Signed   By: Maryclare Bean M.D.   On: 08/29/2013 17:16   Ir Fluoro Guide Cv Line Right  08/30/2013   INDICATION: Poor venous access, in need of intravenous access for medication administration and blood draws. Patient has history of failed right upper extremity approach PICC line placement and requests recurrent right jugular approach PICC line placement.  EXAM: TUNNELED CENTRAL VENOUS HEMODIALYSIS CATHETER PLACEMENT WITH ULTRASOUND AND FLUOROSCOPIC GUIDANCE  MEDICATIONS: None  CONTRAST:  None  ANESTHESIA/SEDATION: None  FLUOROSCOPY TIME:  54 seconds.  COMPLICATIONS: None immediate  PROCEDURE: Informed written consent was obtained from the patient after a  discussion of the risks, benefits, and alternatives to treatment. Questions regarding the procedure were encouraged and answered. The right neck and chest were prepped with chlorhexidine in a sterile fashion, and a sterile drape was applied covering the operative field. Maximum barrier sterile technique with sterile gowns and gloves were used for the procedure. A timeout was performed prior to the initiation of the procedure.  After creating a small venotomy incision, a micropuncture kit was utilized to access the right internal jugular vein under direct, real-time ultrasound guidance after the overlying soft tissues were anesthetized with 1% lidocaine with epinephrine. Ultrasound image documentation was performed. The microwire was kinked to measure appropriate catheter length. Over a micro wire, the micropuncture needle was exchanged for a exchanged for a peel-away sheath. A tunneled dual-lumen PICC measuring 24 cm from tip to cuff was tunneled in a retrograde fashion from the anterior chest wall to the venotomy incision.  The catheter was then placed through the peel-away sheath with tips ultimately positioned within the superior aspect of the right atrium. Final catheter positioning was confirmed and documented with a spot radiographic image. The catheter aspirates and flushes normally. The catheter was flushed with appropriate volume heparin dwells.  The catheter exit site was secured with a 0-Prolene retention suture. The venotomy incision was closed with an interrupted 4-0 Vicryl, Dermabond and Steri-strips. Dressings were applied. The patient tolerated the procedure well without immediate post procedural complication.  IMPRESSION: Successful placement of 24 cm tip to cuff tunneled dual-lumen PICC line via the right internal jugular vein with tips terminating within the superior aspect of the right atrium. The catheter is ready for immediate use.   Electronically Signed   By: Sandi Mariscal M.D.   On: 08/30/2013  17:33   Ir US Guide Vasc Access Right  08/30/2013   INDICATION: Poor venous access, in need of intravenous access for medication administration and blood draws. Patient has history of failed right upper  extremity approach PICC line placement and requests recurrent right jugular approach PICC line placement.  EXAM: TUNNELED CENTRAL VENOUS HEMODIALYSIS CATHETER PLACEMENT WITH ULTRASOUND AND FLUOROSCOPIC GUIDANCE  MEDICATIONS: None  CONTRAST:  None  ANESTHESIA/SEDATION: None  FLUOROSCOPY TIME:  54 seconds.  COMPLICATIONS: None immediate  PROCEDURE: Informed written consent was obtained from the patient after a discussion of the risks, benefits, and alternatives to treatment. Questions regarding the procedure were encouraged and answered. The right neck and chest were prepped with chlorhexidine in a sterile fashion, and a sterile drape was applied covering the operative field. Maximum barrier sterile technique with sterile gowns and gloves were used for the procedure. A timeout was performed prior to the initiation of the procedure.  After creating a small venotomy incision, a micropuncture kit was utilized to access the right internal jugular vein under direct, real-time ultrasound guidance after the overlying soft tissues were anesthetized with 1% lidocaine with epinephrine. Ultrasound image documentation was performed. The microwire was kinked to measure appropriate catheter length. Over a micro wire, the micropuncture needle was exchanged for a exchanged for a peel-away sheath. A tunneled dual-lumen PICC measuring 24 cm from tip to cuff was tunneled in a retrograde fashion from the anterior chest wall to the venotomy incision.  The catheter was then placed through the peel-away sheath with tips ultimately positioned within the superior aspect of the right atrium. Final catheter positioning was confirmed and documented with a spot radiographic image. The catheter aspirates and flushes normally. The catheter was  flushed with appropriate volume heparin dwells.  The catheter exit site was secured with a 0-Prolene retention suture. The venotomy incision was closed with an interrupted 4-0 Vicryl, Dermabond and Steri-strips. Dressings were applied. The patient tolerated the procedure well without immediate post procedural complication.  IMPRESSION: Successful placement of 24 cm tip to cuff tunneled dual-lumen PICC line via the right internal jugular vein with tips terminating within the superior aspect of the right atrium. The catheter is ready for immediate use.   Electronically Signed   By: Sandi Mariscal M.D.   On: 08/30/2013 17:33   Dg Chest Port 1 View  08/29/2013   CLINICAL DATA:  Shortness of breath, asthma, emphysema  EXAM: PORTABLE CHEST - 1 VIEW  COMPARISON:  08/25/2013 and 08/13/2013  FINDINGS: Cardiomediastinal silhouette is stable. Extensive scarring and volume loss bilateral upper lobe again noted. Stable emphysematous changes. Again noted hilar retraction bilaterally. No definite new consolidation is noted. Stable airspace opacities in upper lobes.  IMPRESSION: Extensive scarring and volume loss bilateral upper lobe again noted. Stable emphysematous changes. Again noted hilar retraction bilaterally. No definite new consolidation is noted. Stable airspace opacities in upper lobes.   Electronically Signed   By: Lahoma Crocker M.D.   On: 08/29/2013 13:23   US Abdomen Limited Ruq  08/30/2013   CLINICAL DATA:  Right upper quadrant pain  EXAM: US ABDOMEN LIMITED - RIGHT UPPER QUADRANT  COMPARISON:  None.  FINDINGS: Gallbladder:  Evaluation mildly limited by body habitus but the gallbladder appears normal with no wall thickening or stones. There is however a positive sonographic Murphy's sign.  Common bile duct:  Diameter: 2 mm  Liver:  Inhomogeneous with coarsened echotexture. Very limited evaluation. Cannot exclude potential focal abnormalities.  IMPRESSION: 1. There is a positive sonographic Murphy's sign. Evaluation  of the gallbladder is limited by body habitus, but no stones or wall thickening appreciated. Significance therefore uncertain. 2. Very limited evaluation of the liver likely due to a combination of  fatty infiltration and patient body habitus.   Electronically Signed   By: Skipper Cliche M.D.   On: 08/30/2013 21:31    Scheduled Meds: . allopurinol  100 mg Oral Daily  . docusate sodium  100 mg Oral BID  . enoxaparin (LOVENOX) injection  40 mg Subcutaneous Q24H  . feeding supplement (ENSURE COMPLETE)  237 mL Oral BID BM  . furosemide  40 mg Oral Daily  . imipenem-cilastatin  500 mg Intravenous 4 times per day  . insulin aspart  0-15 Units Subcutaneous TID WC  . ipratropium-albuterol  3 mL Nebulization QID  . leflunomide  20 mg Oral Daily  . levothyroxine  50 mcg Oral QAC breakfast  . loratadine  10 mg Oral Daily  . methylPREDNISolone (SOLU-MEDROL) injection  125 mg Intravenous Daily  . mometasone-formoterol  2 puff Inhalation BID  . montelukast  10 mg Oral Daily  . pantoprazole  40 mg Oral Daily  . potassium chloride SA  60 mEq Oral Daily  . sodium chloride  3 mL Intravenous Q12H  . vancomycin  1,250 mg Intravenous Q12H   Continuous Infusions:   Active Problems:   Chronic diastolic heart failure, NYHA class 1   Hypothyroidism   Diabetes mellitus, type II   Protein-calorie malnutrition, severe   Physical deconditioning   GERD (gastroesophageal reflux disease)   Chronic respiratory failure   Hyperlipidemia   Hypertension   HCAP (healthcare-associated pneumonia)   Pulmonary aspergillosis    Time spent: 35 minutes. Greater than 50% of this time was spent in direct contact with the patient coordinating care.    Donne Hazel  [facility] Pager [phone]  If 7PM-7AM, please contact night-coverage at [email or web address], password [facility] 08/31/2013, 7:59 AM  LOS: 2 days

## 2013-08-31 NOTE — Consult Note (Signed)
Patient Jacqueline Washington      DOB: Mar 28, 1951      YSH:[id number]     Consult Note from the Palliative Medicine Team at [place] Requested by: Dr. Joya Gaskins    PCP: Alesia Richards, MD Reason for Consultation: symptom management    Phone Number:[phone]  Assessment of patients Current state: 63 yr old white female with end stage COPD , anxiety patient was admitted with respiratory failure, course complicated by constipation.  Met with patient and her daughter.  Patient opened the topic of hospice and so we talked a little about that but in general reviewed symptoms of anxiety, constipation and shortness of breath. See my note from the date of service.   Goals of Care: 1.  Code Status:  DNR affirmed   2. Scope of Treatment: At present, patient resistant to most interventions.  She uses her faith to avoid the reality of her advanced disease.  Offered medication for anxiety and dyspnea and constipation   4. Disposition:  To be determined   3. Symptom Management:   1. Anxiety/Agitation: has been offered xanax in the past. 2. Pain/Dyspnea: agree with low dose roxanol 3. Bowel Regimen: schedule senna along with colace.  Add miralax. Consider Relistor if ineffective 4.   4. Psychosocial:  Daughter very supportive and is in a surrogate role despite the patient being married for the second time.  5. Spiritual: Chaplain visits will be arranged. She has a strong faith and states she doesn;t feel its time yet.        Patient Documents Completed or Given: Document Given Completed  Advanced Directives Pkt    MOST    DNR    Gone from My Sight    Hard Choices      Brief HPI: 63 yr old white female with advanced COPD , being treated for exacerbation.  We were asked to address symptom mangement.   ROS:  Anxious "panic attacks",  Dyspnea, nausea    PMH:  Past Medical History  Diagnosis Date  . Asthma   . Emphysema     02 dependent 3L/min  . Emphysema    . Pneumonia   . Hypothyroidism   . Diabetes mellitus, type II   . Obesity   . Diastolic heart failure 06/16/5518    Grade 1  . Emphysema   . Rheumatoid arthritis(714.0)   . Hyperlipidemia   . Hypertension   . Vitamin D deficiency      PSH: Past Surgical History  Procedure Laterality Date  . Tubal ligation    . Colonoscopy    . Video bronchoscopy N/A 11/18/2012    Procedure: VIDEO BRONCHOSCOPY WITH FLUORO;  Surgeon: Elsie Stain, MD;  Location: [facility];  Service: Cardiopulmonary;  Laterality: N/A;  . Eye surgery  at age 8   I have reviewed the [place] and SH and  If appropriate update it with new information. Allergies  Allergen Reactions  . Chantix [Varenicline] Other (See Comments)    Unknown " blurry vision"  . Hctz [Hydrochlorothiazide] Other (See Comments)    unknown  . Penicillins Rash  . Sulfonamide Derivatives Rash  . Tetanus Toxoid Other (See Comments)    Knot on arm   Scheduled Meds: . allopurinol  100 mg Oral Daily  . amLODipine  5 mg Oral Daily  . docusate sodium  100 mg Oral BID  . enoxaparin (LOVENOX) injection  40 mg Subcutaneous Q24H  . feeding supplement (ENSURE COMPLETE)  237 mL  Oral BID BM  . furosemide  40 mg Oral Daily  . imipenem-cilastatin  500 mg Intravenous 4 times per day  . insulin aspart  0-15 Units Subcutaneous TID WC  . ipratropium-albuterol  3 mL Nebulization QID  . leflunomide  20 mg Oral Daily  . levothyroxine  50 mcg Oral QAC breakfast  . lisinopril  10 mg Oral Daily  . loratadine  10 mg Oral Daily  . methylPREDNISolone (SOLU-MEDROL) injection  125 mg Intravenous Daily  . mometasone-formoterol  2 puff Inhalation BID  . montelukast  10 mg Oral Daily  . pantoprazole  40 mg Oral Daily  . potassium chloride SA  60 mEq Oral Daily  . sodium chloride  3 mL Intravenous Q12H  . sorbitol, milk of mag, mineral oil, glycerin (SMOG) enema  960 mL Rectal Once  . vancomycin  1,250 mg Intravenous Q12H   Continuous Infusions:  PRN  Meds:.sodium chloride, albuterol, chlorpheniramine-HYDROcodone, hydrALAZINE, HYDROcodone-acetaminophen, morphine, nystatin cream, senna, sodium chloride, white petrolatum    BP 131/92  Pulse 139  Temp(Src) 97.9 F (36.6 C) (Axillary)  Resp 42  Ht 5' 4" (1.626 m)  Wt 92.3 kg (203 lb 7.8 oz)  BMI 34.91 kg/m2  SpO2 95%   PPS: 30%   Intake/Output Summary (Last 24 hours) at 08/31/13 1733 Last data filed at 08/31/13 1400  Gross per 24 hour  Intake    940 ml  Output   2150 ml  Net  -1210 ml    Physical Exam:  General: Morbidly obese, mod distress secondary to respiratory distress HEENT:  PERRL, EOMI, anicteric, mmm Chest:   Decreased with wheezing, S1, S2 CVS: tachy, irregular, S1, S2 Abdomen: morbidly obese Ext: edema, bilateral lower extremities Neuro:awake, alert oriented , x3  Very focused on her faith   Labs: CBC    Component Value Date/Time   WBC 12.5* 08/31/2013 0345   RBC 3.82* 08/31/2013 0345   HGB 8.8* 08/31/2013 0345   HCT 30.9* 08/31/2013 0345   PLT 172 08/31/2013 0345   MCV 80.9 08/31/2013 0345   MCH 23.0* 08/31/2013 0345   MCHC 28.5* 08/31/2013 0345   RDW 22.3* 08/31/2013 0345   LYMPHSABS 0.7 08/23/2013 1213   MONOABS 0.3 08/23/2013 1213   EOSABS 0.1 08/23/2013 1213   BASOSABS 0.0 08/23/2013 1213      CMP     Component Value Date/Time   NA 139 08/31/2013 0345   K 4.4 08/31/2013 0345   CL 95* 08/31/2013 0345   CO2 35* 08/31/2013 0345   GLUCOSE 128* 08/31/2013 0345   BUN 15 08/31/2013 0345   CREATININE 0.73 08/31/2013 0345   CREATININE 0.81 08/23/2013 1213   CALCIUM 9.2 08/31/2013 0345   PROT 7.4 08/29/2013 1308   ALBUMIN 3.4* 08/29/2013 1308   AST 36 08/29/2013 1308   ALT 33 08/29/2013 1308   ALKPHOS 108 08/29/2013 1308   BILITOT 0.5 08/29/2013 1308   GFRNONAA 90* 08/31/2013 0345   GFRAA >90 08/31/2013 0345    Chest Xray Reviewed/Impressions:Stable upper lobe predominant pleural-parenchymal scarring. No acute  findings identified    Time In Time Out Total  Time Spent with Patient Total Overall Time  330 pm 400 pm 30 min 30 min    Greater than 50%  of this time was spent counseling and coordinating care related to the above assessment and plan.  Gracy Ehly L. Lovena Le, MD MBA The Palliative Medicine Team at [facility] Phone: [phone] Pager: [phone]

## 2013-08-31 NOTE — Progress Notes (Addendum)
INFECTIOUS DISEASE PROGRESS NOTE  ID: Jacqueline Washington is a 63 y.o. female with  Active Problems:   Chronic diastolic heart failure, NYHA class 1   Hypothyroidism   Diabetes mellitus, type II   Protein-calorie malnutrition, severe   Physical deconditioning   GERD (gastroesophageal reflux disease)   Chronic respiratory failure   Hyperlipidemia   Hypertension   HCAP (healthcare-associated pneumonia)   Pulmonary aspergillosis  Subjective: Still having som SOB. Has had 2 anxiety attacks today.  "I do not have TB"  Abtx:  Anti-infectives   Start     Dose/Rate Route Frequency Ordered Stop   08/31/13 1800  vancomycin (VANCOCIN) 1,250 mg in sodium chloride 0.9 % 250 mL IVPB     1,250 mg 166.7 mL/hr over 90 Minutes Intravenous Every 12 hours 08/31/13 0551     08/29/13 2200  voriconazole (VFEND) tablet 200 mg  Status:  Discontinued     200 mg Oral Every 12 hours 08/29/13 2042 08/30/13 1837   08/29/13 2000  vancomycin (VANCOCIN) IVPB 1000 mg/200 mL premix  Status:  Discontinued     1,000 mg 200 mL/hr over 60 Minutes Intravenous Every 8 hours 08/29/13 1851 08/31/13 0551   08/29/13 2000  imipenem-cilastatin (PRIMAXIN) 500 mg in sodium chloride 0.9 % 100 mL IVPB     500 mg 200 mL/hr over 30 Minutes Intravenous 4 times per day 08/29/13 1851        Medications:  Scheduled: . allopurinol  100 mg Oral Daily  . amLODipine  5 mg Oral Daily  . docusate sodium  100 mg Oral BID  . enoxaparin (LOVENOX) injection  40 mg Subcutaneous Q24H  . feeding supplement (ENSURE COMPLETE)  237 mL Oral BID BM  . furosemide  40 mg Oral Daily  . imipenem-cilastatin  500 mg Intravenous 4 times per day  . insulin aspart  0-15 Units Subcutaneous TID WC  . ipratropium-albuterol  3 mL Nebulization QID  . leflunomide  20 mg Oral Daily  . levothyroxine  50 mcg Oral QAC breakfast  . lisinopril  10 mg Oral Daily  . loratadine  10 mg Oral Daily  . methylPREDNISolone (SOLU-MEDROL) injection  125 mg Intravenous  Daily  . mometasone-formoterol  2 puff Inhalation BID  . montelukast  10 mg Oral Daily  . pantoprazole  40 mg Oral Daily  . potassium chloride SA  60 mEq Oral Daily  . sodium chloride  3 mL Intravenous Q12H  . sorbitol, milk of mag, mineral oil, glycerin (SMOG) enema  960 mL Rectal Once  . vancomycin  1,250 mg Intravenous Q12H    Objective: Vital signs in last 24 hours: Temp:  [97.9 F (36.6 C)-98.6 F (37 C)] 97.9 F (36.6 C) (03/18 0900) Pulse Rate:  [100-139] 133 (03/18 1500) Resp:  [12-41] 36 (03/18 1500) BP: (129-193)/(78-114) 150/83 mmHg (03/18 1500) SpO2:  [94 %-100 %] 95 % (03/18 1500) Weight:  [92.3 kg (203 lb 7.8 oz)] 92.3 kg (203 lb 7.8 oz) (03/18 0347)   General appearance: alert, cooperative, mild distress and morbidly obese Resp: rhonchi bilaterally Cardio: regular rate and rhythm GI: normal findings: bowel sounds normal and soft, non-tender  Lab Results  Recent Labs  08/30/13 0320 08/31/13 0345  WBC 6.8 12.5*  HGB 9.6* 8.8*  HCT 32.3* 30.9*  NA 130* 139  K 4.3 4.4  CL 90* 95*  CO2 29 35*  BUN 13 15  CREATININE 0.63 0.73   Liver Panel  Recent Labs  08/29/13 1308  PROT  7.4  ALBUMIN 3.4*  AST 36  ALT 33  ALKPHOS 108  BILITOT 0.5   Sedimentation Rate No results found for this basename: ESRSEDRATE,  in the last 72 hours C-Reactive Protein No results found for this basename: CRP,  in the last 72 hours  Microbiology: Recent Results (from the past 240 hour(s))  CULTURE, BLOOD (ROUTINE X 2)     Status: None   Collection Time    08/29/13  8:00 PM      Result Value Ref Range Status   Specimen Description BLOOD LEFT HAND   Final   Special Requests BOTTLES DRAWN AEROBIC ONLY 10CC   Final   Culture  Setup Time     Final   Value: 08/30/2013 03:46     Performed at [facility]   Culture     Final   Value:        BLOOD CULTURE RECEIVED NO GROWTH TO DATE CULTURE WILL BE HELD FOR 5 DAYS BEFORE ISSUING A FINAL NEGATIVE REPORT     Performed  at [facility]   Report Status PENDING   Incomplete  CULTURE, BLOOD (ROUTINE X 2)     Status: None   Collection Time    08/29/13  8:15 PM      Result Value Ref Range Status   Specimen Description BLOOD LEFT ARM   Final   Special Requests BOTTLES DRAWN AEROBIC ONLY 10CC   Final   Culture  Setup Time     Final   Value: 08/30/2013 02:07     Performed at [facility]   Culture     Final   Value:        BLOOD CULTURE RECEIVED NO GROWTH TO DATE CULTURE WILL BE HELD FOR 5 DAYS BEFORE ISSUING A FINAL NEGATIVE REPORT     Performed at [facility]   Report Status PENDING   Incomplete  MRSA PCR SCREENING     Status: Abnormal   Collection Time    08/29/13  8:49 PM      Result Value Ref Range Status   MRSA by PCR INVALID RESULTS, SPECIMEN SENT FOR CULTURE (*) NEGATIVE Final   Comment: RESULT CALLED TO, READ BACK BY AND VERIFIED WITH:     ASMITH RN AT 0120 ON [id number] BY DLONG                The GeneXpert MRSA Assay (FDA     approved for NASAL specimens     only), is one component of a     comprehensive MRSA colonization     surveillance program. It is not     intended to diagnose MRSA     infection nor to guide or     monitor treatment for     MRSA infections.  MRSA CULTURE     Status: None   Collection Time    08/29/13  8:49 PM      Result Value Ref Range Status   Specimen Description NOSE   Final   Special Requests NONE   Final   Culture     Final   Value: Culture reincubated for better growth     Performed at [facility]   Report Status PENDING   Incomplete  RESPIRATORY VIRUS PANEL     Status: None   Collection Time    08/29/13  9:32 PM      Result Value Ref Range Status   Source - RVPAN NOSE   Final  Respiratory Syncytial Virus A NOT DETECTED   Final   Respiratory Syncytial Virus B NOT DETECTED   Final   Influenza A NOT DETECTED   Final   Influenza B NOT DETECTED   Final   Parainfluenza 1 NOT DETECTED   Final   Parainfluenza 2 NOT DETECTED    Final   Parainfluenza 3 NOT DETECTED   Final   Metapneumovirus NOT DETECTED   Final   Rhinovirus NOT DETECTED   Final   Adenovirus NOT DETECTED   Final   Influenza A H1 NOT DETECTED   Final   Influenza A H3 NOT DETECTED   Final   Comment: (NOTE)           Normal Reference Range for each Analyte: NOT DETECTED     Testing performed using the Luminex xTAG Respiratory Viral Panel test     kit.     This test was developed and its performance characteristics determined     by [employer]. It has not been cleared or approved by the [place]     Food and Drug Administration. This test is used for clinical purposes.     It should not be regarded as investigational or for research. This     laboratory is certified under the [place] (CLIA) as qualified to perform high complexity     clinical laboratory testing.     Performed at [facility], EXPECTORATED SPUTUM-ASSESSMENT     Status: None   Collection Time    08/31/13 12:51 PM      Result Value Ref Range Status   Specimen Description SPUTUM   Final   Special Requests NONE   Final   Sputum evaluation     Final   Value: THIS SPECIMEN IS ACCEPTABLE. RESPIRATORY CULTURE REPORT TO FOLLOW.   Report Status 08/31/2013 FINAL   Final    Studies/Results: Dg Abd 1 View  08/29/2013   CLINICAL DATA:  Abdominal pain, constipation  EXAM: ABDOMEN - 1 VIEW  COMPARISON:  None.  FINDINGS: There is nonspecific nonobstructive bowel gas pattern. Moderate stool noted in right colon. Some gas noted in transverse colon.  IMPRESSION: Nonspecific nonobstructive bowel gas pattern. Moderate stool noted in right colon.   Electronically Signed   By: Lahoma Crocker M.D.   On: 08/29/2013 15:51   Ct Angio Chest W/cm &/or Wo Cm  08/29/2013   CLINICAL DATA:  COPD and pneumonia  EXAM: CT ANGIOGRAPHY CHEST WITH CONTRAST  TECHNIQUE: Multidetector CT imaging of the chest was performed using the standard protocol during  bolus administration of intravenous contrast. Multiplanar CT image reconstructions and MIPs were obtained to evaluate the vascular anatomy.  CONTRAST:  197m OMNIPAQUE IOHEXOL 350 MG/ML SOLN  COMPARISON:  DG CHEST 1V PORT dated 08/29/2013; CT CHEST W/O CM dated 08/10/2013; DG CHEST 2 VIEW dated 04/29/2013; CT CHEST W/CM dated 10/19/2012  FINDINGS: There are no filling defects in the pulmonary arterial tree to suggest acute pulmonary thromboembolism.  No evidence of aortic dissection. Maximal ascending aortic diameter is 3.7 cm. Atherosclerotic changes at the origin of the left common carotid artery and left subclavian artery are noted. Left ventricular hypertrophy is present.  No significant pericardial effusion is present. Stable coronary artery calcifications. Small mediastinal nodes are stable.  Small left pleural effusion is stable and is probably loculated.  Fibrotic lung disease within apical prep collection is stable. Scattered patchy opacities in the lower lungs are  also stable. Confluent opacity in the superior segment of the left lower lobe is also stable. Severe emphysema.  No pneumothorax.  No acute bony deformity.  Round soft tissue density in the left breast is stable.  Review of the MIP images confirms the above findings.  IMPRESSION: No evidence of acute pulmonary thromboembolism.  Chronic lung disease.  Stable confluent pulmonary opacity in the superior segment of the left lower lobe previously characterized as pneumonia.   Electronically Signed   By: Maryclare Bean M.D.   On: 08/29/2013 17:16   Ir Fluoro Guide Cv Line Right  08/30/2013   INDICATION: Poor venous access, in need of intravenous access for medication administration and blood draws. Patient has history of failed right upper extremity approach PICC line placement and requests recurrent right jugular approach PICC line placement.  EXAM: TUNNELED CENTRAL VENOUS HEMODIALYSIS CATHETER PLACEMENT WITH ULTRASOUND AND FLUOROSCOPIC GUIDANCE   MEDICATIONS: None  CONTRAST:  None  ANESTHESIA/SEDATION: None  FLUOROSCOPY TIME:  54 seconds.  COMPLICATIONS: None immediate  PROCEDURE: Informed written consent was obtained from the patient after a discussion of the risks, benefits, and alternatives to treatment. Questions regarding the procedure were encouraged and answered. The right neck and chest were prepped with chlorhexidine in a sterile fashion, and a sterile drape was applied covering the operative field. Maximum barrier sterile technique with sterile gowns and gloves were used for the procedure. A timeout was performed prior to the initiation of the procedure.  After creating a small venotomy incision, a micropuncture kit was utilized to access the right internal jugular vein under direct, real-time ultrasound guidance after the overlying soft tissues were anesthetized with 1% lidocaine with epinephrine. Ultrasound image documentation was performed. The microwire was kinked to measure appropriate catheter length. Over a micro wire, the micropuncture needle was exchanged for a exchanged for a peel-away sheath. A tunneled dual-lumen PICC measuring 24 cm from tip to cuff was tunneled in a retrograde fashion from the anterior chest wall to the venotomy incision.  The catheter was then placed through the peel-away sheath with tips ultimately positioned within the superior aspect of the right atrium. Final catheter positioning was confirmed and documented with a spot radiographic image. The catheter aspirates and flushes normally. The catheter was flushed with appropriate volume heparin dwells.  The catheter exit site was secured with a 0-Prolene retention suture. The venotomy incision was closed with an interrupted 4-0 Vicryl, Dermabond and Steri-strips. Dressings were applied. The patient tolerated the procedure well without immediate post procedural complication.  IMPRESSION: Successful placement of 24 cm tip to cuff tunneled dual-lumen PICC line via the  right internal jugular vein with tips terminating within the superior aspect of the right atrium. The catheter is ready for immediate use.   Electronically Signed   By: Sandi Mariscal M.D.   On: 08/30/2013 17:33   Ir US Guide Vasc Access Right  08/30/2013   INDICATION: Poor venous access, in need of intravenous access for medication administration and blood draws. Patient has history of failed right upper extremity approach PICC line placement and requests recurrent right jugular approach PICC line placement.  EXAM: TUNNELED CENTRAL VENOUS HEMODIALYSIS CATHETER PLACEMENT WITH ULTRASOUND AND FLUOROSCOPIC GUIDANCE  MEDICATIONS: None  CONTRAST:  None  ANESTHESIA/SEDATION: None  FLUOROSCOPY TIME:  54 seconds.  COMPLICATIONS: None immediate  PROCEDURE: Informed written consent was obtained from the patient after a discussion of the risks, benefits, and alternatives to treatment. Questions regarding the procedure were encouraged and answered. The right neck  and chest were prepped with chlorhexidine in a sterile fashion, and a sterile drape was applied covering the operative field. Maximum barrier sterile technique with sterile gowns and gloves were used for the procedure. A timeout was performed prior to the initiation of the procedure.  After creating a small venotomy incision, a micropuncture kit was utilized to access the right internal jugular vein under direct, real-time ultrasound guidance after the overlying soft tissues were anesthetized with 1% lidocaine with epinephrine. Ultrasound image documentation was performed. The microwire was kinked to measure appropriate catheter length. Over a micro wire, the micropuncture needle was exchanged for a exchanged for a peel-away sheath. A tunneled dual-lumen PICC measuring 24 cm from tip to cuff was tunneled in a retrograde fashion from the anterior chest wall to the venotomy incision.  The catheter was then placed through the peel-away sheath with tips ultimately  positioned within the superior aspect of the right atrium. Final catheter positioning was confirmed and documented with a spot radiographic image. The catheter aspirates and flushes normally. The catheter was flushed with appropriate volume heparin dwells.  The catheter exit site was secured with a 0-Prolene retention suture. The venotomy incision was closed with an interrupted 4-0 Vicryl, Dermabond and Steri-strips. Dressings were applied. The patient tolerated the procedure well without immediate post procedural complication.  IMPRESSION: Successful placement of 24 cm tip to cuff tunneled dual-lumen PICC line via the right internal jugular vein with tips terminating within the superior aspect of the right atrium. The catheter is ready for immediate use.   Electronically Signed   By: Sandi Mariscal M.D.   On: 08/30/2013 17:33   US Abdomen Limited Ruq  08/30/2013   CLINICAL DATA:  Right upper quadrant pain  EXAM: US ABDOMEN LIMITED - RIGHT UPPER QUADRANT  COMPARISON:  None.  FINDINGS: Gallbladder:  Evaluation mildly limited by body habitus but the gallbladder appears normal with no wall thickening or stones. There is however a positive sonographic Murphy's sign.  Common bile duct:  Diameter: 2 mm  Liver:  Inhomogeneous with coarsened echotexture. Very limited evaluation. Cannot exclude potential focal abnormalities.  IMPRESSION: 1. There is a positive sonographic Murphy's sign. Evaluation of the gallbladder is limited by body habitus, but no stones or wall thickening appreciated. Significance therefore uncertain. 2. Very limited evaluation of the liver likely due to a combination of fatty infiltration and patient body habitus.   Electronically Signed   By: Skipper Cliche M.D.   On: 08/30/2013 21:31     Assessment/Plan: Cavitary lung lesion COPD  BOOP  RA  DM2   Appreciate Dr Bettina Gavia f/u. 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   Total days of antibiotics: 2 vanco/imipenem         Bobby Rumpf Infectious Diseases (pager) [phone] [email or web address] 08/31/2013, 3:43 PM  LOS: 2 days

## 2013-08-31 NOTE — Progress Notes (Signed)
PT Cancellation Note  Patient Details Name: Jacqueline Washington MRN: [id number] DOB: Nov 06, 1950   Cancelled Treatment:    Reason Eval/Treat Not Completed: Medical issues which prohibited therapy, incr. HR, RR, for Palliative consult. Will follow along for  Pt's ability to participate in mobility.   Rada Hay 08/31/2013, 11:48 AM Blanchard Kelch PT [phone]

## 2013-08-31 NOTE — Progress Notes (Signed)
PULMONARY / CRITICAL CARE MEDICINE   Name: Jacqueline Washington MRN: [id number] DOB: Nov 10, 1950    ADMISSION DATE:  08/29/2013 CONSULTATION DATE:  3/17  REFERRING MD :  Philip Aspen PRIMARY SERVICE: [facility]  CHIEF COMPLAINT:  Right shoulder and side pain  BRIEF PATIENT DESCRIPTION: 63 y/o female with a complex pulmonary history including COPD and rheumatoid arthritis related lung disease was re-admitted on 3/16 with right shoulder pain and increasing shortness of breath.  SIGNIFICANT EVENTS / STUDIES:  3/16 - CT Angio chest > no pe, severe bilateral emphysema, left effusion unchanged, ? 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; several areas of frank consolidation RUL and LUL  LINES / TUBES: R IJ Tunneled Dual Lumen 3/17>>>  CULTURES: 3/17 sputum bacterial> 3/17 sputum fungal >  ANTIBIOTICS: 3/16 vanc >> 3/16 imipenem>> VFend (pre-admission) >>3/17   SUBJECTIVE: Pt reports ongoing epigastric pain  VITAL SIGNS: Temp:  [97.7 F (36.5 C)-98.6 F (37 C)] 98.6 F (37 C) (03/18 0347) Pulse Rate:  [100-127] 110 (03/18 0600) Resp:  [12-41] 28 (03/18 0600) BP: (118-183)/(78-110) 183/97 mmHg (03/18 0600) SpO2:  [94 %-100 %] 98 % (03/18 0805) Weight:  [203 lb 7.8 oz (92.3 kg)] 203 lb 7.8 oz (92.3 kg) (03/18 0347)  HEMODYNAMICS:   VENTILATOR SETTINGS:    INTAKE / OUTPUT: Intake/Output     03/17 0701 - 03/18 0700 03/18 0701 - 03/19 0700   I.V. (mL/kg) 140 (1.5)    IV Piggyback 1100    Total Intake(mL/kg) 1240 (13.4)    Urine (mL/kg/hr) 2550 (1.2)    Total Output 2550     Net -1310            PHYSICAL EXAMINATION: Gen: chronically ill appearing, tachypnic but no acute distress HEENT: NCAT, EOMi, OP clear,  PULM: Crackles bilaterally, few exp wheezes CV: RRR, no mgr, no JVD AB: BS+, soft, mildly tender RUQ, no hsm Ext: warm, trace edema, no clubbing, no cyanosis Derm: no rash or skin breakdown Neuro: A&Ox4, CN II-XII intact, MAEW   LABS:  CBC  Recent Labs Lab  08/29/13 1308 08/30/13 0320 08/31/13 0345  WBC 10.1 6.8 12.5*  HGB 11.3* 9.6* 8.8*  HCT 37.9 32.3* 30.9*  PLT 218 175 172   Coag's No results found for this basename: APTT, INR,  in the last 168 hours BMET  Recent Labs Lab 08/29/13 1308 08/30/13 0320 08/31/13 0345  NA 127* 130* 139  K 4.8 4.3 4.4  CL 82* 90* 95*  CO2 31 29 35*  BUN 18 13 15   CREATININE 0.80 0.63 0.73  GLUCOSE 173* 174* 128*   Electrolytes  Recent Labs Lab 08/29/13 1308 08/30/13 0320 08/31/13 0345  CALCIUM 9.8 9.0 9.2   ABG No results found for this basename: PHART, PCO2ART, PO2ART,  in the last 168 hours Liver Enzymes  Recent Labs Lab 08/29/13 1308  AST 36  ALT 33  ALKPHOS 108  BILITOT 0.5  ALBUMIN 3.4*   Cardiac Enzymes No results found for this basename: TROPONINI, PROBNP,  in the last 168 hours Glucose  Recent Labs Lab 08/29/13 2149 08/30/13 0750 08/30/13 1153 08/30/13 1841 08/30/13 2112  GLUCAP 187* 189* 190* 167* 200*    Imaging  See above  ASSESSMENT / PLAN:  PULMONARY A: COPD (2013 FEV1 1.23 L, 54% pred) with severe emphysema complicated by rheumatoid arthritis related lung disease.   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  The only way to confirm this would be to perform a biopsy which is too dangerous.  Regardless the definitive diagnosis, her overall prognosis from a pulmonary perspective is poor.    Urine strep antigen positive, unclear if this is a false positive.  Doubt TB given prior negative BAL culture.  P:   -agree with increased steroid dose for organizing pneumonia -will f/u sputum culture for fungus -agree with empiric HCAP coverage, defer to ID for choices here -continue bronchodilators -wean O2 as indicated -consult palliative care for assistance with narcotics for relief of dyspnea -VFend  discontinued  GASTROINTESTINAL A:   R shoulder, epigastric pain> is this referred pain from constipation? RUQ ultrasound negative Constipation from narcotics P:   -assess troponin x1, EKG -bowel regimen, no BM since 3/12 -consult palliative care to help with symptom management    INFECTIOUS A:   HCAP? P:   -abx per ID -f/u cultures -will need to re-visit port placement at some point (was to have one placed but held due to concern for acute infection)   Global: Palliative Medicine consult called per Dr. Delford Field for EOL issues and dyspnea relief.   Canary Brim, NP-C [facility] Pgr: [phone] or [phone]  08/31/2013, 9:04 AM  Attending:  I have seen and examined the patient with nurse practitioner/resident and agree with the note above.    Yolonda Kida PCCM Pager: [phone] Cell: [phone] If no response, call [phone]

## 2013-09-01 DIAGNOSIS — J189 Pneumonia, unspecified organism: Secondary | ICD-10-CM

## 2013-09-01 DIAGNOSIS — I10 Essential (primary) hypertension: Secondary | ICD-10-CM

## 2013-09-01 DIAGNOSIS — Z515 Encounter for palliative care: Secondary | ICD-10-CM

## 2013-09-01 DIAGNOSIS — K5900 Constipation, unspecified: Secondary | ICD-10-CM

## 2013-09-01 DIAGNOSIS — E119 Type 2 diabetes mellitus without complications: Secondary | ICD-10-CM

## 2013-09-01 DIAGNOSIS — R0902 Hypoxemia: Secondary | ICD-10-CM

## 2013-09-01 DIAGNOSIS — J9610 Chronic respiratory failure, unspecified whether with hypoxia or hypercapnia: Secondary | ICD-10-CM

## 2013-09-01 DIAGNOSIS — J9620 Acute and chronic respiratory failure, unspecified whether with hypoxia or hypercapnia: Secondary | ICD-10-CM

## 2013-09-01 LAB — CBC
HEMATOCRIT: 31.3 % — AB (ref 36.0–46.0)
Hemoglobin: 9 g/dL — ABNORMAL LOW (ref 12.0–15.0)
MCH: 23.3 pg — ABNORMAL LOW (ref 26.0–34.0)
MCHC: 28.8 g/dL — AB (ref 30.0–36.0)
MCV: 81.1 fL (ref 78.0–100.0)
Platelets: 165 10*3/uL (ref 150–400)
RBC: 3.86 MIL/uL — AB (ref 3.87–5.11)
RDW: 22.7 % — ABNORMAL HIGH (ref 11.5–15.5)
WBC: 11 10*3/uL — AB (ref 4.0–10.5)

## 2013-09-01 LAB — GLUCOSE, CAPILLARY
GLUCOSE-CAPILLARY: 129 mg/dL — AB (ref 70–99)
GLUCOSE-CAPILLARY: 108 mg/dL — AB (ref 70–99)
GLUCOSE-CAPILLARY: 204 mg/dL — AB (ref 70–99)
GLUCOSE-CAPILLARY: 198 mg/dL — AB (ref 70–99)
Glucose-Capillary: 146 mg/dL — ABNORMAL HIGH (ref 70–99)
Glucose-Capillary: 190 mg/dL — ABNORMAL HIGH (ref 70–99)

## 2013-09-01 LAB — BASIC METABOLIC PANEL
BUN: 20 mg/dL (ref 6–23)
CHLORIDE: 96 meq/L (ref 96–112)
CO2: 34 mEq/L — ABNORMAL HIGH (ref 19–32)
Calcium: 9 mg/dL (ref 8.4–10.5)
Creatinine, Ser: 0.69 mg/dL (ref 0.50–1.10)
GFR calc non Af Amer: >90 mL/min (ref >90)
Glucose, Bld: 124 mg/dL — ABNORMAL HIGH (ref 70–99)
POTASSIUM: 4.1 meq/L (ref 3.7–5.3)
SODIUM: 140 meq/L (ref 137–147)

## 2013-09-01 LAB — MRSA CULTURE

## 2013-09-01 MED ORDER — AMLODIPINE BESYLATE 5 MG PO TABS
5.0000 mg | ORAL_TABLET | Freq: Once | ORAL | Status: AC
  Administered 2013-09-01: 5 mg via ORAL
  Filled 2013-09-01: qty 1

## 2013-09-01 MED ORDER — AMLODIPINE BESYLATE 10 MG PO TABS
10.0000 mg | ORAL_TABLET | Freq: Every day | ORAL | Status: DC
  Administered 2013-09-02 – 2013-09-08 (×7): 10 mg via ORAL
  Filled 2013-09-01 (×7): qty 1

## 2013-09-01 MED ORDER — ALPRAZOLAM 1 MG PO TABS
1.0000 mg | ORAL_TABLET | Freq: Once | ORAL | Status: AC
  Administered 2013-09-01: 1 mg via ORAL
  Filled 2013-09-01: qty 1

## 2013-09-01 MED ORDER — POLYETHYLENE GLYCOL 3350 17 G PO PACK
17.0000 g | PACK | Freq: Every day | ORAL | Status: DC
  Administered 2013-09-01 – 2013-09-07 (×4): 17 g via ORAL
  Filled 2013-09-01 (×9): qty 1

## 2013-09-01 MED ORDER — SENNA 8.6 MG PO TABS
1.0000 | ORAL_TABLET | Freq: Two times a day (BID) | ORAL | Status: DC
  Administered 2013-09-01 – 2013-09-07 (×6): 8.6 mg via ORAL
  Filled 2013-09-01 (×8): qty 1

## 2013-09-01 NOTE — Progress Notes (Signed)
Patient [id number] C Hassinger      DOB: [birth year]      YIR:[id number]   Palliative Medicine Team at [facility] Progress Note    Subjective: Patient appears calmer this am, less distress but still purse lip breathing.  She admits that the first part of the month when everyone was talking about death and hospice that she was upset.  She states that she doesn't feel that way this visit.  She relates without prompting that she is a DNR and all her family knows it and when it happens it happens.  She was unable to have enema yesterday due to laying flat issues.  She is not vomiting or nauseated and so I think that the best next step before jumping Relistor is to try Miralax and increase senna.  She reports intermittent sensation of her throat closing up.  She has no difficulty  swallowing large pills and her symptoms are inconsistent, and may be related to anxiety more than anything ( globus sensation).  She has no evidence for thrush.  Her daughter reported facial swelling yesterday which could be from steroids, but she was also on an ace which was to be discontinued with Norvasc in its place.  We awaiting testing from sputum which is pending.  The patient continues to refuse to take an antianxiety drug.  Spiritual care consult was made.    Filed Vitals:   09/01/13 0909  BP: 151/102  Pulse: 115  Temp: 97  Resp: 36   Physical exam:  General: no acute distress, but some purse lip breathing intermittently, completing sentences better PERRL, EOMI, no oral thrush, mm dry Chest decreased in all fields, no wheezing at present CVS: tachycardic, S1, S2, Abd: morbidly obese mild diffuse discomfort on palpation but no guarding or rebound Ext: trace not pitting edema Neuro: awake , alert , less anxious, but still slips easily into this mode.Cranial nerves are intact     Assessment and plan: 63 yr old female with centrolobular emphysema, rheumatoid disease with element of of fibrosis and possible fugal  infection reflected in a cavitary lesion.  She was not able to have a bowel movement  And could not turn or lay  Flat for an enema.  She becomes tearful when she thinks about having a movement in the bed requiring the staff to clean her up.  She reports intermittent sensation of her "throut closing off". This may represent a globus sensation because of the way that she describes it and the fact that at present she has no stridor or wheezing to suggest a true obstruction. She does have an ACE ordered which will be discontinued in lieu of the Norvasc that was started per pulmonary.  She does not have lip swelling but did complain of facial swelling- could be from steroids.   1.  DNR  2.  Constipation:  Since she is not nauseated or vomiting and is not obstructed .  I would increase senna and start miralax from above.  Golytley could also be sipped on over the course of the day if miralax not effective.  Holding off on Relistor at this time as other measures have not been used to treat the symptom, and relistor is not without side effects.  3.  Patient self volunteering information of opinion of hospice.  She is not accepting this at this time but is getting there.   total time: 930 -1000 am.  Discussed with Pulmonary NP  Kynzli Rease L. Ladona Ridgel, MD MBA The Palliative  Medicine Team at [facility] Team Phone: [phone] Pager: [phone]

## 2013-09-01 NOTE — Progress Notes (Addendum)
INFECTIOUS DISEASE PROGRESS NOTE  ID: DOREATHA OFFER is a 63 y.o. female with  Active Problems:   Chronic diastolic heart failure, NYHA class 1   Hypothyroidism   Diabetes mellitus, type II   Protein-calorie malnutrition, severe   Physical deconditioning   GERD (gastroesophageal reflux disease)   Chronic respiratory failure   Hyperlipidemia   Hypertension   HCAP (healthcare-associated pneumonia)   Pulmonary aspergillosis  Subjective: Still SOB  Abtx:  Anti-infectives   Start     Dose/Rate Route Frequency Ordered Stop   08/31/13 1800  vancomycin (VANCOCIN) 1,250 mg in sodium chloride 0.9 % 250 mL IVPB     1,250 mg 166.7 mL/hr over 90 Minutes Intravenous Every 12 hours 08/31/13 0551     08/29/13 2200  voriconazole (VFEND) tablet 200 mg  Status:  Discontinued     200 mg Oral Every 12 hours 08/29/13 2042 08/30/13 1837   08/29/13 2000  vancomycin (VANCOCIN) IVPB 1000 mg/200 mL premix  Status:  Discontinued     1,000 mg 200 mL/hr over 60 Minutes Intravenous Every 8 hours 08/29/13 1851 08/31/13 0551   08/29/13 2000  imipenem-cilastatin (PRIMAXIN) 500 mg in sodium chloride 0.9 % 100 mL IVPB     500 mg 200 mL/hr over 30 Minutes Intravenous 4 times per day 08/29/13 1851        Medications:  Scheduled: . allopurinol  100 mg Oral Daily  . [START ON 09/02/2013] amLODipine  10 mg Oral Daily  . amLODipine  5 mg Oral Once  . docusate sodium  100 mg Oral BID  . enoxaparin (LOVENOX) injection  40 mg Subcutaneous Q24H  . feeding supplement (ENSURE COMPLETE)  237 mL Oral BID BM  . furosemide  40 mg Oral Daily  . imipenem-cilastatin  500 mg Intravenous 4 times per day  . insulin aspart  0-15 Units Subcutaneous TID WC  . ipratropium-albuterol  3 mL Nebulization QID  . leflunomide  20 mg Oral Daily  . levothyroxine  50 mcg Oral QAC breakfast  . loratadine  10 mg Oral Daily  . methylPREDNISolone (SOLU-MEDROL) injection  125 mg Intravenous Daily  . mometasone-formoterol  2 puff  Inhalation BID  . montelukast  10 mg Oral Daily  . pantoprazole  40 mg Oral Daily  . polyethylene glycol  17 g Oral Daily  . potassium chloride SA  60 mEq Oral Daily  . senna  1 tablet Oral BID  . sodium chloride  3 mL Intravenous Q12H  . vancomycin  1,250 mg Intravenous Q12H    Objective: Vital signs in last 24 hours: Temp:  [98.1 F (36.7 C)-99 F (37.2 C)] 98.8 F (37.1 C) (03/19 0800) Pulse Rate:  [100-139] 111 (03/19 0800) Resp:  [11-42] 13 (03/19 0800) BP: (114-151)/(69-102) 151/102 mmHg (03/19 0909) SpO2:  [92 %-99 %] 97 % (03/19 0925) Weight:  [89.812 kg (198 lb)] 89.812 kg (198 lb) (03/19 0500)   General appearance: alert, cooperative and mild distress Resp: diminished breath sounds bilaterally and rhonchi bilaterally Cardio: tachycardic GI: normal findings: bowel sounds normal and spleen non-palpable  Lab Results  Recent Labs  08/31/13 0345 09/01/13 0527  WBC 12.5* 11.0*  HGB 8.8* 9.0*  HCT 30.9* 31.3*  NA 139 140  K 4.4 4.1  CL 95* 96  CO2 35* 34*  BUN 15 20  CREATININE 0.73 0.69   Liver Panel  Recent Labs  08/29/13 1308  PROT 7.4  ALBUMIN 3.4*  AST 36  ALT 33  ALKPHOS 108  BILITOT 0.5   Sedimentation Rate No results found for this basename: ESRSEDRATE,  in the last 72 hours C-Reactive Protein No results found for this basename: CRP,  in the last 72 hours  Microbiology: Recent Results (from the past 240 hour(s))  CULTURE, BLOOD (ROUTINE X 2)     Status: None   Collection Time    08/29/13  8:00 PM      Result Value Ref Range Status   Specimen Description BLOOD LEFT HAND   Final   Special Requests BOTTLES DRAWN AEROBIC ONLY 10CC   Final   Culture  Setup Time     Final   Value: 08/30/2013 03:46     Performed at [facility]   Culture     Final   Value:        BLOOD CULTURE RECEIVED NO GROWTH TO DATE CULTURE WILL BE HELD FOR 5 DAYS BEFORE ISSUING A FINAL NEGATIVE REPORT     Performed at [facility]   Report Status  PENDING   Incomplete  CULTURE, BLOOD (ROUTINE X 2)     Status: None   Collection Time    08/29/13  8:15 PM      Result Value Ref Range Status   Specimen Description BLOOD LEFT ARM   Final   Special Requests BOTTLES DRAWN AEROBIC ONLY 10CC   Final   Culture  Setup Time     Final   Value: 08/30/2013 02:07     Performed at [facility]   Culture     Final   Value:        BLOOD CULTURE RECEIVED NO GROWTH TO DATE CULTURE WILL BE HELD FOR 5 DAYS BEFORE ISSUING A FINAL NEGATIVE REPORT     Performed at [facility]   Report Status PENDING   Incomplete  MRSA PCR SCREENING     Status: Abnormal   Collection Time    08/29/13  8:49 PM      Result Value Ref Range Status   MRSA by PCR INVALID RESULTS, SPECIMEN SENT FOR CULTURE (*) NEGATIVE Final   Comment: RESULT CALLED TO, READ BACK BY AND VERIFIED WITH:     ASMITH RN AT 0120 ON [id number] BY DLONG                The GeneXpert MRSA Assay (FDA     approved for NASAL specimens     only), is one component of a     comprehensive MRSA colonization     surveillance program. It is not     intended to diagnose MRSA     infection nor to guide or     monitor treatment for     MRSA infections.  MRSA CULTURE     Status: None   Collection Time    08/29/13  8:49 PM      Result Value Ref Range Status   Specimen Description NOSE   Final   Special Requests NONE   Final   Culture     Final   Value: Culture reincubated for better growth     Performed at [facility]   Report Status PENDING   Incomplete  RESPIRATORY VIRUS PANEL     Status: None   Collection Time    08/29/13  9:32 PM      Result Value Ref Range Status   Source - RVPAN NOSE   Final   Respiratory Syncytial Virus A NOT DETECTED   Final   Respiratory  Syncytial Virus B NOT DETECTED   Final   Influenza A NOT DETECTED   Final   Influenza B NOT DETECTED   Final   Parainfluenza 1 NOT DETECTED   Final   Parainfluenza 2 NOT DETECTED   Final   Parainfluenza 3 NOT DETECTED    Final   Metapneumovirus NOT DETECTED   Final   Rhinovirus NOT DETECTED   Final   Adenovirus NOT DETECTED   Final   Influenza A H1 NOT DETECTED   Final   Influenza A H3 NOT DETECTED   Final   Comment: (NOTE)           Normal Reference Range for each Analyte: NOT DETECTED     Testing performed using the Luminex xTAG Respiratory Viral Panel test     kit.     This test was developed and its performance characteristics determined     by [facility]. It has not been cleared or approved by the [place]     Food and Drug Administration. This test is used for clinical purposes.     It should not be regarded as investigational or for research. This     laboratory is certified under the [place] (CLIA) as qualified to perform high complexity     clinical laboratory testing.     Performed at [place], RESPIRATORY (NON-EXPECTORATED)     Status: None   Collection Time    08/31/13 12:32 PM      Result Value Ref Range Status   Specimen Description SPUTUM   Final   Special Requests NONE   Final   Gram Stain     Final   Value: NO WBC SEEN     NO SQUAMOUS EPITHELIAL CELLS SEEN     NO ORGANISMS SEEN     Performed at [facility]   Culture PENDING   Incomplete   Report Status PENDING   Incomplete  CULTURE, EXPECTORATED SPUTUM-ASSESSMENT     Status: None   Collection Time    08/31/13 12:51 PM      Result Value Ref Range Status   Specimen Description SPUTUM   Final   Special Requests NONE   Final   Sputum evaluation     Final   Value: THIS SPECIMEN IS ACCEPTABLE. RESPIRATORY CULTURE REPORT TO FOLLOW.   Report Status 08/31/2013 FINAL   Final    Studies/Results: Ir Fluoro Guide Cv Line Right  08/30/2013   INDICATION: Poor venous access, in need of intravenous access for medication administration and blood draws. Patient has history of failed right upper extremity approach PICC line placement and requests recurrent right  jugular approach PICC line placement.  EXAM: TUNNELED CENTRAL VENOUS HEMODIALYSIS CATHETER PLACEMENT WITH ULTRASOUND AND FLUOROSCOPIC GUIDANCE  MEDICATIONS: None  CONTRAST:  None  ANESTHESIA/SEDATION: None  FLUOROSCOPY TIME:  54 seconds.  COMPLICATIONS: None immediate  PROCEDURE: Informed written consent was obtained from the patient after a discussion of the risks, benefits, and alternatives to treatment. Questions regarding the procedure were encouraged and answered. The right neck and chest were prepped with chlorhexidine in a sterile fashion, and a sterile drape was applied covering the operative field. Maximum barrier sterile technique with sterile gowns and gloves were used for the procedure. A timeout was performed prior to the initiation of the procedure.  After creating a small venotomy incision, a micropuncture kit was utilized to access the right internal jugular vein under  direct, real-time ultrasound guidance after the overlying soft tissues were anesthetized with 1% lidocaine with epinephrine. Ultrasound image documentation was performed. The microwire was kinked to measure appropriate catheter length. Over a micro wire, the micropuncture needle was exchanged for a exchanged for a peel-away sheath. A tunneled dual-lumen PICC measuring 24 cm from tip to cuff was tunneled in a retrograde fashion from the anterior chest wall to the venotomy incision.  The catheter was then placed through the peel-away sheath with tips ultimately positioned within the superior aspect of the right atrium. Final catheter positioning was confirmed and documented with a spot radiographic image. The catheter aspirates and flushes normally. The catheter was flushed with appropriate volume heparin dwells.  The catheter exit site was secured with a 0-Prolene retention suture. The venotomy incision was closed with an interrupted 4-0 Vicryl, Dermabond and Steri-strips. Dressings were applied. The patient tolerated the procedure well  without immediate post procedural complication.  IMPRESSION: Successful placement of 24 cm tip to cuff tunneled dual-lumen PICC line via the right internal jugular vein with tips terminating within the superior aspect of the right atrium. The catheter is ready for immediate use.   Electronically Signed   By: Sandi Mariscal M.D.   On: 08/30/2013 17:33   Ir US Guide Vasc Access Right  08/30/2013   INDICATION: Poor venous access, in need of intravenous access for medication administration and blood draws. Patient has history of failed right upper extremity approach PICC line placement and requests recurrent right jugular approach PICC line placement.  EXAM: TUNNELED CENTRAL VENOUS HEMODIALYSIS CATHETER PLACEMENT WITH ULTRASOUND AND FLUOROSCOPIC GUIDANCE  MEDICATIONS: None  CONTRAST:  None  ANESTHESIA/SEDATION: None  FLUOROSCOPY TIME:  54 seconds.  COMPLICATIONS: None immediate  PROCEDURE: Informed written consent was obtained from the patient after a discussion of the risks, benefits, and alternatives to treatment. Questions regarding the procedure were encouraged and answered. The right neck and chest were prepped with chlorhexidine in a sterile fashion, and a sterile drape was applied covering the operative field. Maximum barrier sterile technique with sterile gowns and gloves were used for the procedure. A timeout was performed prior to the initiation of the procedure.  After creating a small venotomy incision, a micropuncture kit was utilized to access the right internal jugular vein under direct, real-time ultrasound guidance after the overlying soft tissues were anesthetized with 1% lidocaine with epinephrine. Ultrasound image documentation was performed. The microwire was kinked to measure appropriate catheter length. Over a micro wire, the micropuncture needle was exchanged for a exchanged for a peel-away sheath. A tunneled dual-lumen PICC measuring 24 cm from tip to cuff was tunneled in a retrograde fashion  from the anterior chest wall to the venotomy incision.  The catheter was then placed through the peel-away sheath with tips ultimately positioned within the superior aspect of the right atrium. Final catheter positioning was confirmed and documented with a spot radiographic image. The catheter aspirates and flushes normally. The catheter was flushed with appropriate volume heparin dwells.  The catheter exit site was secured with a 0-Prolene retention suture. The venotomy incision was closed with an interrupted 4-0 Vicryl, Dermabond and Steri-strips. Dressings were applied. The patient tolerated the procedure well without immediate post procedural complication.  IMPRESSION: Successful placement of 24 cm tip to cuff tunneled dual-lumen PICC line via the right internal jugular vein with tips terminating within the superior aspect of the right atrium. The catheter is ready for immediate use.   Electronically Signed   By: Jenny Reichmann  Watts M.D.   On: 08/30/2013 17:33   US Abdomen Limited Ruq  08/30/2013   CLINICAL DATA:  Right upper quadrant pain  EXAM: US ABDOMEN LIMITED - RIGHT UPPER QUADRANT  COMPARISON:  None.  FINDINGS: Gallbladder:  Evaluation mildly limited by body habitus but the gallbladder appears normal with no wall thickening or stones. There is however a positive sonographic Murphy's sign.  Common bile duct:  Diameter: 2 mm  Liver:  Inhomogeneous with coarsened echotexture. Very limited evaluation. Cannot exclude potential focal abnormalities.  IMPRESSION: 1. There is a positive sonographic Murphy's sign. Evaluation of the gallbladder is limited by body habitus, but no stones or wall thickening appreciated. Significance therefore uncertain. 2. Very limited evaluation of the liver likely due to a combination of fatty infiltration and patient body habitus.   Electronically Signed   By: Skipper Cliche M.D.   On: 08/30/2013 21:31     Assessment/Plan: Cavitary lung lesion  COPD  BOOP  RA   Constipation DM2  Total days of antibiotics: 3 vanco/imipenem  Still tachypneic, tachycardic Bowel hygiene Await  Quantiferon, sputum studies Await fungal serologies No change in anbx for now- appreciate pharm assistance with adjusting vanco dosing (elevated Tr 3-18)         Bobby Rumpf Infectious Diseases (pager) [phone] [email or web address] 09/01/2013, 11:27 AM  LOS: 3 days

## 2013-09-01 NOTE — Progress Notes (Signed)
eLink Physician-Brief Progress Note Patient Name: Jacqueline Washington DOB: 11/01/1950 MRN: [id number]  Date of Service  09/01/2013   HPI/Events of Note   Pt with a panic attack. She has had previously but has wanted to avoid meds. She is now asking for something to assist with her nerves.   eICU Interventions  - xanax x 1 - will repeat or change to ativan depending on her response   Intervention Category Minor Interventions: Agitation / anxiety - evaluation and management  Zephyr Ridley S. 09/01/2013, 6:49 PM

## 2013-09-01 NOTE — Progress Notes (Signed)
PT Cancellation Note /  Patient Details Name: Jacqueline Washington MRN: [id number] DOB: December 13, 1950   Cancelled Treatment:    Reason Eval/Treat Not Completed: Medical issues which prohibited therapy RN reports pt not yet medically ready, and aware PT to sign off at this time.  Please reorder when/if appropriate.  Thanks.   Shukri Nistler,KATHrine E 09/01/2013, 11:40 AM Zenovia Jarred, PT, DPT 09/01/2013 Pager: [phone]

## 2013-09-01 NOTE — Progress Notes (Signed)
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 -On Vanc and primaxin for ?HCAP. 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 D/c'd on 3/17 -Appreciate pulmonary input.\ - PMT recs noted and appreciated  Right Shoulder Pain -Suspected diaphragmatic pain from constipation-->recs noted per PMT  HCAP as above Initially Suspected Pulmonary Aspergillosis as above. Voriconazole d/c'd per Pulm recs as pt not thought to have aspergillosis  HTN Better controlled. Cont current regimen.  Code Status: DNR Family Communication: Pt in room Disposition Plan: Pending - Pending PMT consult  Consultants:  Pulmonary   Antibiotics:  Vanc 3/16>>>  imipenem  3/16>>>  voriconazole (antifungal) 3/16>>>3/17  Subjective: No acute events overnight. Pt reports breathing well this AM  Objective: Filed Vitals:   09/01/13 0500 09/01/13 0527 09/01/13 0536 09/01/13 0600  BP: 145/90   144/88  Pulse: 109   116  Temp:  99 F (37.2 C)    TempSrc:  Oral    Resp: 23   26  Height:      Weight: 89.812 kg (198 lb)     SpO2: 96%  93% 96%    Intake/Output Summary (Last 24 hours) at 09/01/13 0859 Last data filed at 09/01/13 0600  Gross per 24 hour  Intake   2310 ml  Output   2100 ml  Net    210 ml   Filed Weights   08/30/13 0145 08/31/13 0347 09/01/13 0500  Weight: 90.7 kg (199 lb 15.3 oz) 92.3 kg (203 lb 7.8 oz) 89.812 kg (198 lb)    Exam:   General:  AA Ox3  Cardiovascular: RRR  Respiratory: wheezing on R with B decreased BS  Abdomen: S/NT/ND/+BS  Extremities: trace bilateral edema   Neurologic:  Non-focal  Data Reviewed: Basic Metabolic Panel:  Recent Labs Lab 08/29/13 1308  08/30/13 0320 08/31/13 0345 09/01/13 0527  NA 127* 130* 139 140  K 4.8 4.3 4.4 4.1  CL 82* 90* 95* 96  CO2 31 29 35* 34*  GLUCOSE 173* 174* 128* 124*  BUN $Re'18 13 15 20  'hwK$ CREATININE 0.80 0.63 0.73 0.69  CALCIUM 9.8 9.0 9.2 9.0   Liver Function Tests:  Recent Labs Lab 08/29/13 1308  AST 36  ALT 33  ALKPHOS 108  BILITOT 0.5  PROT 7.4  ALBUMIN 3.4*    Recent Labs Lab 08/29/13 1308  LIPASE 30   No results found for this basename: AMMONIA,  in the last 168 hours CBC:  Recent Labs Lab 08/29/13 1308 08/30/13 0320 08/31/13 0345 09/01/13 0527  WBC 10.1 6.8 12.5* 11.0*  HGB 11.3* 9.6* 8.8* 9.0*  HCT 37.9 32.3* 30.9* 31.3*  MCV 78.3 78.4 80.9 81.1  PLT 218 175 172 165   Cardiac Enzymes:  Recent Labs Lab 08/31/13 1055  TROPONINI <0.30   BNP (last 3 results)  Recent Labs  03/25/13 0601 04/14/13 2300 08/10/13 1554  PROBNP 1150.0* 301.6* 1099.0*   CBG:  Recent Labs Lab 08/31/13 0901 08/31/13 1138 08/31/13 1718 08/31/13 2200 09/01/13 0810  GLUCAP 129* 171* 173* 146* 108*    Recent Results (from the past 240 hour(s))  CULTURE, BLOOD (ROUTINE X 2)     Status: None   Collection Time    08/29/13  8:00 PM      Result Value Ref Range Status   Specimen  Description BLOOD LEFT HAND   Final   Special Requests BOTTLES DRAWN AEROBIC ONLY 10CC   Final   Culture  Setup Time     Final   Value: 08/30/2013 03:46     Performed at [facility]   Culture     Final   Value:        BLOOD CULTURE RECEIVED NO GROWTH TO DATE CULTURE WILL BE HELD FOR 5 DAYS BEFORE ISSUING A FINAL NEGATIVE REPORT     Performed at [facility]   Report Status PENDING   Incomplete  CULTURE, BLOOD (ROUTINE X 2)     Status: None   Collection Time    08/29/13  8:15 PM      Result Value Ref Range Status   Specimen Description BLOOD LEFT ARM   Final   Special Requests BOTTLES DRAWN AEROBIC ONLY 10CC   Final   Culture  Setup Time     Final   Value: 08/30/2013 02:07      Performed at [facility]   Culture     Final   Value:        BLOOD CULTURE RECEIVED NO GROWTH TO DATE CULTURE WILL BE HELD FOR 5 DAYS BEFORE ISSUING A FINAL NEGATIVE REPORT     Performed at [facility]   Report Status PENDING   Incomplete  MRSA PCR SCREENING     Status: Abnormal   Collection Time    08/29/13  8:49 PM      Result Value Ref Range Status   MRSA by PCR INVALID RESULTS, SPECIMEN SENT FOR CULTURE (*) NEGATIVE Final   Comment: RESULT CALLED TO, READ BACK BY AND VERIFIED WITH:     ASMITH RN AT 0120 ON [id number] BY DLONG                The GeneXpert MRSA Assay (FDA     approved for NASAL specimens     only), is one component of a     comprehensive MRSA colonization     surveillance program. It is not     intended to diagnose MRSA     infection nor to guide or     monitor treatment for     MRSA infections.  MRSA CULTURE     Status: None   Collection Time    08/29/13  8:49 PM      Result Value Ref Range Status   Specimen Description NOSE   Final   Special Requests NONE   Final   Culture     Final   Value: Culture reincubated for better growth     Performed at [facility]   Report Status PENDING   Incomplete  RESPIRATORY VIRUS PANEL     Status: None   Collection Time    08/29/13  9:32 PM      Result Value Ref Range Status   Source - RVPAN NOSE   Final   Respiratory Syncytial Virus A NOT DETECTED   Final   Respiratory Syncytial Virus B NOT DETECTED   Final   Influenza A NOT DETECTED   Final   Influenza B NOT DETECTED   Final   Parainfluenza 1 NOT DETECTED   Final   Parainfluenza 2 NOT DETECTED   Final   Parainfluenza 3 NOT DETECTED   Final   Metapneumovirus NOT DETECTED   Final   Rhinovirus NOT DETECTED   Final   Adenovirus NOT DETECTED   Final  Influenza A H1 NOT DETECTED   Final   Influenza A H3 NOT DETECTED   Final   Comment: (NOTE)           Normal Reference Range for each Analyte: NOT DETECTED     Testing performed using the  Luminex xTAG Respiratory Viral Panel test     kit.     This test was developed and its performance characteristics determined     by [facility]. It has not been cleared or approved by the [place]     Food and Drug Administration. This test is used for clinical purposes.     It should not be regarded as investigational or for research. This     laboratory is certified under the [place] (CLIA) as qualified to perform high complexity     clinical laboratory testing.     Performed at [place], EXPECTORATED SPUTUM-ASSESSMENT     Status: None   Collection Time    08/31/13 12:51 PM      Result Value Ref Range Status   Specimen Description SPUTUM   Final   Special Requests NONE   Final   Sputum evaluation     Final   Value: THIS SPECIMEN IS ACCEPTABLE. RESPIRATORY CULTURE REPORT TO FOLLOW.   Report Status 08/31/2013 FINAL   Final     Studies: Ir Fluoro Guide Cv Line Right  08/30/2013   INDICATION: Poor venous access, in need of intravenous access for medication administration and blood draws. Patient has history of failed right upper extremity approach PICC line placement and requests recurrent right jugular approach PICC line placement.  EXAM: TUNNELED CENTRAL VENOUS HEMODIALYSIS CATHETER PLACEMENT WITH ULTRASOUND AND FLUOROSCOPIC GUIDANCE  MEDICATIONS: None  CONTRAST:  None  ANESTHESIA/SEDATION: None  FLUOROSCOPY TIME:  54 seconds.  COMPLICATIONS: None immediate  PROCEDURE: Informed written consent was obtained from the patient after a discussion of the risks, benefits, and alternatives to treatment. Questions regarding the procedure were encouraged and answered. The right neck and chest were prepped with chlorhexidine in a sterile fashion, and a sterile drape was applied covering the operative field. Maximum barrier sterile technique with sterile gowns and gloves were used for the procedure. A timeout was performed prior to  the initiation of the procedure.  After creating a small venotomy incision, a micropuncture kit was utilized to access the right internal jugular vein under direct, real-time ultrasound guidance after the overlying soft tissues were anesthetized with 1% lidocaine with epinephrine. Ultrasound image documentation was performed. The microwire was kinked to measure appropriate catheter length. Over a micro wire, the micropuncture needle was exchanged for a exchanged for a peel-away sheath. A tunneled dual-lumen PICC measuring 24 cm from tip to cuff was tunneled in a retrograde fashion from the anterior chest wall to the venotomy incision.  The catheter was then placed through the peel-away sheath with tips ultimately positioned within the superior aspect of the right atrium. Final catheter positioning was confirmed and documented with a spot radiographic image. The catheter aspirates and flushes normally. The catheter was flushed with appropriate volume heparin dwells.  The catheter exit site was secured with a 0-Prolene retention suture. The venotomy incision was closed with an interrupted 4-0 Vicryl, Dermabond and Steri-strips. Dressings were applied. The patient tolerated the procedure well without immediate post procedural complication.  IMPRESSION: Successful placement of 24 cm tip to cuff tunneled dual-lumen PICC line via the right internal jugular  vein with tips terminating within the superior aspect of the right atrium. The catheter is ready for immediate use.   Electronically Signed   By: Sandi Mariscal M.D.   On: 08/30/2013 17:33   Ir US Guide Vasc Access Right  08/30/2013   INDICATION: Poor venous access, in need of intravenous access for medication administration and blood draws. Patient has history of failed right upper extremity approach PICC line placement and requests recurrent right jugular approach PICC line placement.  EXAM: TUNNELED CENTRAL VENOUS HEMODIALYSIS CATHETER PLACEMENT WITH ULTRASOUND AND  FLUOROSCOPIC GUIDANCE  MEDICATIONS: None  CONTRAST:  None  ANESTHESIA/SEDATION: None  FLUOROSCOPY TIME:  54 seconds.  COMPLICATIONS: None immediate  PROCEDURE: Informed written consent was obtained from the patient after a discussion of the risks, benefits, and alternatives to treatment. Questions regarding the procedure were encouraged and answered. The right neck and chest were prepped with chlorhexidine in a sterile fashion, and a sterile drape was applied covering the operative field. Maximum barrier sterile technique with sterile gowns and gloves were used for the procedure. A timeout was performed prior to the initiation of the procedure.  After creating a small venotomy incision, a micropuncture kit was utilized to access the right internal jugular vein under direct, real-time ultrasound guidance after the overlying soft tissues were anesthetized with 1% lidocaine with epinephrine. Ultrasound image documentation was performed. The microwire was kinked to measure appropriate catheter length. Over a micro wire, the micropuncture needle was exchanged for a exchanged for a peel-away sheath. A tunneled dual-lumen PICC measuring 24 cm from tip to cuff was tunneled in a retrograde fashion from the anterior chest wall to the venotomy incision.  The catheter was then placed through the peel-away sheath with tips ultimately positioned within the superior aspect of the right atrium. Final catheter positioning was confirmed and documented with a spot radiographic image. The catheter aspirates and flushes normally. The catheter was flushed with appropriate volume heparin dwells.  The catheter exit site was secured with a 0-Prolene retention suture. The venotomy incision was closed with an interrupted 4-0 Vicryl, Dermabond and Steri-strips. Dressings were applied. The patient tolerated the procedure well without immediate post procedural complication.  IMPRESSION: Successful placement of 24 cm tip to cuff tunneled  dual-lumen PICC line via the right internal jugular vein with tips terminating within the superior aspect of the right atrium. The catheter is ready for immediate use.   Electronically Signed   By: Sandi Mariscal M.D.   On: 08/30/2013 17:33   US Abdomen Limited Ruq  08/30/2013   CLINICAL DATA:  Right upper quadrant pain  EXAM: US ABDOMEN LIMITED - RIGHT UPPER QUADRANT  COMPARISON:  None.  FINDINGS: Gallbladder:  Evaluation mildly limited by body habitus but the gallbladder appears normal with no wall thickening or stones. There is however a positive sonographic Murphy's sign.  Common bile duct:  Diameter: 2 mm  Liver:  Inhomogeneous with coarsened echotexture. Very limited evaluation. Cannot exclude potential focal abnormalities.  IMPRESSION: 1. There is a positive sonographic Murphy's sign. Evaluation of the gallbladder is limited by body habitus, but no stones or wall thickening appreciated. Significance therefore uncertain. 2. Very limited evaluation of the liver likely due to a combination of fatty infiltration and patient body habitus.   Electronically Signed   By: Skipper Cliche M.D.   On: 08/30/2013 21:31    Scheduled Meds: . allopurinol  100 mg Oral Daily  . amLODipine  5 mg Oral Daily  . docusate sodium  100 mg Oral BID  . enoxaparin (LOVENOX) injection  40 mg Subcutaneous Q24H  . feeding supplement (ENSURE COMPLETE)  237 mL Oral BID BM  . furosemide  40 mg Oral Daily  . imipenem-cilastatin  500 mg Intravenous 4 times per day  . insulin aspart  0-15 Units Subcutaneous TID WC  . ipratropium-albuterol  3 mL Nebulization QID  . leflunomide  20 mg Oral Daily  . levothyroxine  50 mcg Oral QAC breakfast  . lisinopril  10 mg Oral Daily  . loratadine  10 mg Oral Daily  . methylPREDNISolone (SOLU-MEDROL) injection  125 mg Intravenous Daily  . mometasone-formoterol  2 puff Inhalation BID  . montelukast  10 mg Oral Daily  . pantoprazole  40 mg Oral Daily  . potassium chloride SA  60 mEq Oral  Daily  . sodium chloride  3 mL Intravenous Q12H  . sorbitol, milk of mag, mineral oil, glycerin (SMOG) enema  960 mL Rectal Once  . vancomycin  1,250 mg Intravenous Q12H   Continuous Infusions:   Active Problems:   Chronic diastolic heart failure, NYHA class 1   Hypothyroidism   Diabetes mellitus, type II   Protein-calorie malnutrition, severe   Physical deconditioning   GERD (gastroesophageal reflux disease)   Chronic respiratory failure   Hyperlipidemia   Hypertension   HCAP (healthcare-associated pneumonia)   Pulmonary aspergillosis    Time spent: 35 minutes. Greater than 50% of this time was spent in direct contact with the patient coordinating care.    Donne Hazel  [facility] Pager [phone]  If 7PM-7AM, please contact night-coverage at [email or web address], password [facility] 09/01/2013, 8:59 AM  LOS: 3 days

## 2013-09-01 NOTE — Progress Notes (Signed)
PULMONARY / CRITICAL CARE MEDICINE   Name: Jacqueline Washington MRN: [id number] DOB: 08-Feb-1951    ADMISSION DATE:  08/29/2013 CONSULTATION DATE:  3/17  REFERRING MD :  Isaac Bliss PRIMARY SERVICE: [facility]  CHIEF COMPLAINT:  Right shoulder and side pain  BRIEF PATIENT DESCRIPTION: 63 y/o female with a complex pulmonary history including COPD and rheumatoid arthritis related lung disease was re-admitted on 3/16 with right shoulder pain and increasing shortness of breath.  SIGNIFICANT EVENTS / STUDIES:  3/16 - CT Angio chest > no pe, severe bilateral emphysema, left effusion unchanged, ? 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; several areas of frank consolidation RUL and LUL 3/17 - Met with Palliative Care, several anxiety attacks  LINES / TUBES: R IJ Tunneled Dual Lumen 3/17>>>  CULTURES: 3/17 sputum bacterial>> 3/17 sputum fungal >> 3/16 BCx2>> 3/16 RVP>>neg  ANTIBIOTICS: 3/16 vanc >> 3/16 imipenem>> VFend (pre-admission) >>3/17   SUBJECTIVE: Pt reports sensation of throat closing up during day yesterday.  Occurred after eating a bagel for bkfst.  Tolerating pills this am without difficulty.   VITAL SIGNS: Temp:  [98.1 F (36.7 C)-99 F (37.2 C)] 98.8 F (37.1 C) (03/19 0800) Pulse Rate:  [100-139] 116 (03/19 0600) Resp:  [11-42] 26 (03/19 0600) BP: (114-163)/(69-102) 151/102 mmHg (03/19 0909) SpO2:  [92 %-99 %] 96 % (03/19 0600) Weight:  [198 lb (89.812 kg)] 198 lb (89.812 kg) (03/19 0500)  INTAKE / OUTPUT: Intake/Output     03/18 0701 - 03/19 0700 03/19 0701 - 03/20 0700   P.O. 1440    I.V. (mL/kg) 460 (5.1)    IV Piggyback 650    Total Intake(mL/kg) 2550 (28.4)    Urine (mL/kg/hr) 2100 (1)    Total Output 2100     Net +450            PHYSICAL EXAMINATION: Gen: chronically ill appearing, tachypnic but no acute distress HEENT: NCAT, EOMi, OP clear, no exudate or lesions PULM: faint crackles lower posterior bilaterally, few exp wheezes CV: RRR, no mgr, no  JVD AB: BS+, soft, mildly tender RUQ, no hsm Ext: warm, 1-2+ LE edema, no clubbing, no cyanosis Derm: no rash or skin breakdown Neuro: A&Ox4, CN II-XII intact, MAEW   LABS:  CBC  Recent Labs Lab 08/30/13 0320 08/31/13 0345 09/01/13 0527  WBC 6.8 12.5* 11.0*  HGB 9.6* 8.8* 9.0*  HCT 32.3* 30.9* 31.3*  PLT 175 172 165   Coag's No results found for this basename: APTT, INR,  in the last 168 hours BMET  Recent Labs Lab 08/30/13 0320 08/31/13 0345 09/01/13 0527  NA 130* 139 140  K 4.3 4.4 4.1  CL 90* 95* 96  CO2 29 35* 34*  BUN _0 CREATININE 0.63 0.73 0.69  GLUCOSE 174* 128* 124*   Electrolytes  Recent Labs Lab 08/30/13 0320 08/31/13 0345 09/01/13 0527  CALCIUM 9.0 9.2 9.0   ABG No results found for this basename: PHART, PCO2ART, PO2ART,  in the last 168 hours Liver Enzymes  Recent Labs Lab 08/29/13 1308  AST 36  ALT 33  ALKPHOS 108  BILITOT 0.5  ALBUMIN 3.4*   Cardiac Enzymes  Recent Labs Lab 08/31/13 1055  TROPONINI <0.30   Glucose  Recent Labs Lab 08/30/13 2112 08/31/13 0901 08/31/13 1138 08/31/13 1718 08/31/13 2200 09/01/13 0810  GLUCAP 200* 129* 171* 173* 146* 108*    Imaging  See above  ASSESSMENT / PLAN:  PULMONARY A: COPD (2013 FEV1 1.23 L, 54% pred) with  severe emphysema complicated by rheumatoid arthritis related lung disease.   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  CT images highly suspicious for aspergilloma in LUL vs necrotizing organizing PNA.  The only way to confirm this would be to perform a biopsy which is too dangerous.  Regardless the definitive diagnosis, her overall prognosis from a pulmonary perspective is poor.    Urine strep antigen positive, unclear if this is a false positive.  Doubt TB given prior negative BAL culture.  P:   -agree with increased steroid dose for organizing pneumonia -f/u sputum culture for fungus -agree with empiric HCAP  coverage, defer to ID for choices here -continue bronchodilators -wean O2 as indicated -consult palliative care for assistance with narcotics for relief of dyspnea -lasix as ordered -consider lateral plain film to assess airway for swelling but respiratory status limits transport to radiology.  Likely anxiety as contributing factor. -d/c ACE-I with sensation of throat swelling 3/19  GASTROINTESTINAL A:   R shoulder, epigastric pain> is this referred pain from constipation? RUQ ultrasound negative Constipation from narcotics P:   -bowel regimen, no BM since 3/12 -consult palliative care to help with symptom management.  Although, pt resistant to narcotics / anxiolytics  -EKG, troponin negative   INFECTIOUS A:   Likely Organizing PNA P:   -abx per ID -f/u cultures -will need to re-visit port placement at some point (was to have one placed but held due to concern for acute infection)   Global: Palliative Medicine consult called per Dr. Joya Gaskins for EOL issues and dyspnea relief.   Noe Gens, NP-C [facility] Pgr: [phone] or [phone]  09/01/2013, 9:23 AM  Attending:  I have seen and examined the patient with nurse practitioner/resident and agree with the note above.   Appreciate assistance from ID and Palliative care Today we haven't seen too much improvement.  We continue to empirically treat end stage organizing pneumonia and COPD with possibly HCAP with solumedrol and broad spectrum antibiotics.  Her lack of improvement is apparently typically for her (she says it usually takes a few days). She had some parotid gland swelling this morning likely related to the dry mouth, improved with coffee  I discussed hospice with her at length this morning.  She is opening up to the idea.  She wants to know if she can be admitted to the hospital on hospice.  Will try to get more information for her on that.  Continue solumedrol and antibiotics, increase  amlodipine, family bringing grapes for constipation (usually works for her), and will continue to address hospice.  Jillyn Hidden PCCM Pager: [phone] Cell: [phone] If no response, call [phone]

## 2013-09-02 ENCOUNTER — Ambulatory Visit: Payer: Medicare Other | Admitting: Critical Care Medicine

## 2013-09-02 ENCOUNTER — Ambulatory Visit (INDEPENDENT_AMBULATORY_CARE_PROVIDER_SITE_OTHER): Payer: Self-pay | Admitting: Surgery

## 2013-09-02 LAB — QUANTIFERON TB GOLD ASSAY (BLOOD)
Mitogen value: 0.23 IU/mL
Quantiferon Nil Value: 0.02 IU/mL
TB Ag value: 0.02 IU/mL
TB Antigen Minus Nil Value: 0 IU/mL

## 2013-09-02 LAB — GLUCOSE, CAPILLARY
GLUCOSE-CAPILLARY: 126 mg/dL — AB (ref 70–99)
GLUCOSE-CAPILLARY: 192 mg/dL — AB (ref 70–99)
Glucose-Capillary: 129 mg/dL — ABNORMAL HIGH (ref 70–99)
Glucose-Capillary: 175 mg/dL — ABNORMAL HIGH (ref 70–99)

## 2013-09-02 LAB — VANCOMYCIN, TROUGH: Vancomycin Tr: 23.6 ug/mL — ABNORMAL HIGH (ref 10.0–20.0)

## 2013-09-02 MED ORDER — ZOLPIDEM TARTRATE 10 MG PO TABS
ORAL_TABLET | ORAL | Status: AC
  Filled 2013-09-02: qty 1

## 2013-09-02 MED ORDER — METHYLPREDNISOLONE SODIUM SUCC 125 MG IJ SOLR
60.0000 mg | Freq: Every day | INTRAMUSCULAR | Status: DC
Start: 2013-09-03 — End: 2013-09-03
  Administered 2013-09-03: 60 mg via INTRAVENOUS
  Filled 2013-09-02: qty 0.96

## 2013-09-02 MED ORDER — VANCOMYCIN HCL 10 G IV SOLR
1500.0000 mg | INTRAVENOUS | Status: DC
  Administered 2013-09-02 – 2013-09-04 (×3): 1500 mg via INTRAVENOUS
  Filled 2013-09-02 (×4): qty 1500

## 2013-09-02 MED ORDER — ALPRAZOLAM 0.5 MG PO TABS
0.5000 mg | ORAL_TABLET | Freq: Three times a day (TID) | ORAL | Status: DC | PRN
  Administered 2013-09-02 – 2013-09-04 (×5): 0.5 mg via ORAL
  Filled 2013-09-02 (×6): qty 1

## 2013-09-02 MED ORDER — ZOLPIDEM TARTRATE 5 MG PO TABS: 5.0000 mg | ORAL_TABLET | Freq: Once | ORAL | Status: DC

## 2013-09-02 NOTE — Progress Notes (Signed)
ANTIBIOTIC CONSULT NOTE - Follow up  Pharmacy Consult for Vancomycin, Primaxin Indication: pneumonia   Allergies  Allergen Reactions  . Chantix [Varenicline] Other (See Comments)    Unknown " blurry vision"  . Hctz [Hydrochlorothiazide] Other (See Comments)    unknown  . Penicillins Rash  . Sulfonamide Derivatives Rash  . Tetanus Toxoid Other (See Comments)    Knot on arm    Patient Measurements: Height: 5\' 4"  (162.6 cm) Weight: 200 lb (90.719 kg) IBW/kg (Calculated) : 54.7  Vital Signs: Temp: 98.5 F (36.9 C) (03/20 1100) Temp src: Oral (03/20 0700) BP: 135/70 mmHg (03/20 0800) Pulse Rate: 123 (03/20 0900)  Labs:  Recent Labs  08/31/13 0345 09/01/13 0527  WBC 12.5* 11.0*  HGB 8.8* 9.0*  PLT 172 165  CREATININE 0.73 0.69   Estimated Creatinine Clearance: 79.5 ml/min (by C-G formula based on Cr of 0.69).  Assessment: 53 [facility]F [facility] with abdominal and chest pain and SOB, now with acute on chronic respiratory failure in setting of RA related pulmonary fibrosis, underlying emphysema, and progressive fibrocavitary lung disease with HCAP.  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  Pharmacy is consulted to dose vancomycin and Primaxin. Voriconazole recently started for aspergillosis, discontinued 3/17.   ID is following  FYI: Antibiotics on previous admission (2/25 - 3/3 ) included vanc/aztreonam transitioned to levofloxacin and voriconazole was added for r/o pulmonary Aspergillosis. 2/25 Aspergillus galactomannan Ag not detected.  D5 Antibiotics PTA 2/26 >> voriconazole >> 3/17 3/16 >> Vanc >> 3/16 >> Primaxin >>   Tmax: Afeb WBCs: 12.5-->11 Renal: SCr 0.69, stable, CrCl 55ml/min (N >100), UOP 1.1/kg/hr  Microbiology 3/16 Resp viral panel: negative 3/16 blood x2: NGTD 3/16 MRSA culture: pending 3/16 Strep pneumo + / Legionella - 3/18 Fungus culture with smear: in  process 3/17 Fungal antibodies: in process 3/17: Quantiferon TB gold: in process 3/18: sputum: reincubated  Drug level / dose changes info: 3/18: VT at 0330 = 25.6 mg/l on 1g IV q8h, adjusted Vancomycin to 1250mg  q12h   Goal of Therapy:  Vancomycin trough level 15-20 mcg/ml Eradication of infection  Plan:   Continue Primaxin 500mg  IV q6h and Vancomycin 1250 mg IV q12h.  Measure Vanc trough at steady state tonight  Follow up renal fxn and culture results.  Thank you for the consult.  4/18, PharmD, BCPS Pager: [phone] Pharmacy: [phone] 09/02/2013 12:44 PM

## 2013-09-02 NOTE — Progress Notes (Signed)
PULMONARY / CRITICAL CARE MEDICINE   Name: Jacqueline Washington MRN: [id number] DOB: 06/25/1950    ADMISSION DATE:  08/29/2013 CONSULTATION DATE:  3/17  REFERRING MD :  Hernandez Acosta PRIMARY SERVICE: TRH  CHIEF COMPLAINT:  Right shoulder and side pain  BRIEF PATIENT DESCRIPTION: 62 y/o female with a complex pulmonary history including COPD and rheumatoid arthritis related lung disease was re-admitted on 3/16 with right shoulder pain and increasing shortness of breath.  SIGNIFICANT EVENTS / STUDIES:  3/16 - CT Angio chest > no pe, severe bilateral emphysema, left effusion unchanged, ? 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 BM yesterday x2.    VITAL SIGNS: Temp:  [97.8 F (36.6 C)-98.3 F (36.8 C)] 97.9 F (36.6 C) (03/20 0700) Pulse Rate:  [94-139] 99 (03/20 0800) Resp:  [13-33] 16 (03/20 0800) BP: (86-137)/(47-95) 135/70 mmHg (03/20 0800) SpO2:  [94 %-100 %] 100 % (03/20 0800) Weight:  [200 lb (90.719 kg)] 200 lb (90.719 kg) (03/20 0500)  INTAKE / OUTPUT: Intake/Output     03/19 0701 - 03/20 0700 03/20 0701 - 03/21 0700   P.O. 1560    I.V. (mL/kg) 490 (5.4)    IV Piggyback 550    Total Intake(mL/kg) 2600 (28.7)    Urine (mL/kg/hr) 2625 (1.2)    Total Output 2625     Net -25          Stool Occurrence 2 x      PHYSICAL EXAMINATION: Gen: chronically ill appearing, tachypnic but no acute distress HEENT: NCAT, EOMi, OP clear, no exudate or lesions PULM: faint crackles lower posterior bilaterally, few exp wheezes CV: RRR, no mgr, no JVD AB: BS+, soft, mildly tender RUQ, no  hsm Ext: warm, 1-2+ LE edema, no clubbing, no cyanosis Derm: no rash or skin breakdown Neuro: A&Ox4, CN II-XII intact, MAEW   LABS:  CBC  Recent Labs Lab 08/30/13 0320 08/31/13 0345 09/01/13 0527  WBC 6.8 12.5* 11.0*  HGB 9.6* 8.8* 9.0*  HCT 32.3* 30.9* 31.3*  PLT 175 172 165   Coag's No results found for this basename: APTT, INR,  in the last 168 hours BMET  Recent Labs Lab 08/30/13 0320 08/31/13 0345 09/01/13 0527  NA 130* 139 140  K 4.3 4.4 4.1  CL 90* 95* 96  CO2 29 35* 34*  BUN 13 15 20  CREATININE 0.63 0.73 0.69  GLUCOSE 174* 128* 124*   Electrolytes  Recent Labs Lab 08/30/13 0320 08/31/13 0345 09/01/13 0527  CALCIUM 9.0 9.2 9.0   ABG No results found for this basename: PHART, PCO2ART, PO2ART,  in the last 168 hours Liver Enzymes  Recent Labs Lab 08/29/13 1308  AST 36  ALT 33  ALKPHOS 108  BILITOT 0.5  ALBUMIN 3.4*   Cardiac Enzymes  Recent Labs Lab 08/31/13 1055  TROPONINI <0.30   Glucose  Recent Labs Lab 08/31/13 2200 09/01/13 0810 09/01/13 1237 09/01/13 1652 09/01/13 2149 09/02/13 0744  GLUCAP 146* 108* 190* 204* 198* 129*    Imaging  See above  ASSESSMENT / PLAN:  PULMONARY A: COPD (2013 FEV1 1.23 L, 54% pred) with severe emphysema complicated by   rheumatoid arthritis related lung disease.   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  CT images highly suspicious for aspergilloma in LUL vs necrotizing organizing PNA.  The only way to confirm this would be to perform a biopsy which is too dangerous.  Regardless the definitive diagnosis, her overall prognosis from a pulmonary perspective is poor.    Urine strep antigen positive, unclear if this is a false positive.  Prelim AFB, Fungal culture neg.   P:   -agree with increased steroid dose for organizing pneumonia -agree with empiric HCAP coverage, defer to ID for choices here -continue bronchodilators -wean O2 as  indicated -Palliative care following for assistance with narcotics for relief of dyspnea, appreciate input -lasix as ordered  GASTROINTESTINAL A:   R shoulder, epigastric pain> is this referred pain from constipation? RUQ ultrasound negative Constipation from narcotics P:   -continue bowel regimen -Ppalliative care to help with symptom management.  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 Wright for EOL issues and dyspnea relief.   Brandi Ollis, NP-C [facility] Pgr: [phone] or [phone]  09/02/2013, 9:10 AM  Attending:  I have seen and examined the patient with nurse practitioner/resident and agree with the note above.   She is better today, mostly due to the Xanax, I think I think the high dose of steroids is making her euphoric somewhat and hypervigilant  I had a lengthy conversation with her yesterday regarding hospice, her major hold up right now is the fact that she has developed friendships with the folks from [facility].  Her family is agreeable to hospice  Plan: -wean solumedrol today, would have to home dose of 40mg by Monday -ABX per ID -PT consult -likely home next week  MCQUAID, DOUGLAS [facility] PCCM Pager: [phone] Cell: [phone] If no response, call [phone]  

## 2013-09-02 NOTE — Progress Notes (Signed)
Brief Pharmacy Consult Note - Vancomycin Follow Up  Labs: vanc trough 23.6  A/P: vanc trough supratherapeutic (goal 15-20). Will change dose from 1250mg  IV q12 to 1500mg  IV q24. Spoke with RN and had 6pm 1250mg  dose not given and will schedule first dose of 1500mg  later on tonight  Lorenza Winkleman M Bryanna Yim, PharmD, BCPS Pager [phone] 09/02/2013 6:12 PM

## 2013-09-02 NOTE — Progress Notes (Signed)
INFECTIOUS DISEASE PROGRESS NOTE  ID: Jacqueline Washington is a 63 y.o. female with  Active Problems:   Chronic diastolic heart failure, NYHA class 1   Hypothyroidism   Diabetes mellitus, type II   Protein-calorie malnutrition, severe   Physical deconditioning   GERD (gastroesophageal reflux disease)   Chronic respiratory failure   Hyperlipidemia   Hypertension   HCAP (healthcare-associated pneumonia)   Pulmonary aspergillosis  Subjective: " I feel better. God's going to heal my lungs"  Abtx:  Anti-infectives   Start     Dose/Rate Route Frequency Ordered Stop   08/31/13 1800  vancomycin (VANCOCIN) 1,250 mg in sodium chloride 0.9 % 250 mL IVPB     1,250 mg 166.7 mL/hr over 90 Minutes Intravenous Every 12 hours 08/31/13 0551     08/29/13 2200  voriconazole (VFEND) tablet 200 mg  Status:  Discontinued     200 mg Oral Every 12 hours 08/29/13 2042 08/30/13 1837   08/29/13 2000  vancomycin (VANCOCIN) IVPB 1000 mg/200 mL premix  Status:  Discontinued     1,000 mg 200 mL/hr over 60 Minutes Intravenous Every 8 hours 08/29/13 1851 08/31/13 0551   08/29/13 2000  imipenem-cilastatin (PRIMAXIN) 500 mg in sodium chloride 0.9 % 100 mL IVPB     500 mg 200 mL/hr over 30 Minutes Intravenous 4 times per day 08/29/13 1851        Medications:  Scheduled: . allopurinol  100 mg Oral Daily  . amLODipine  10 mg Oral Daily  . docusate sodium  100 mg Oral BID  . enoxaparin (LOVENOX) injection  40 mg Subcutaneous Q24H  . feeding supplement (ENSURE COMPLETE)  237 mL Oral BID BM  . furosemide  40 mg Oral Daily  . imipenem-cilastatin  500 mg Intravenous 4 times per day  . insulin aspart  0-15 Units Subcutaneous TID WC  . ipratropium-albuterol  3 mL Nebulization QID  . leflunomide  20 mg Oral Daily  . levothyroxine  50 mcg Oral QAC breakfast  . loratadine  10 mg Oral Daily  . [START ON 09/03/2013] methylPREDNISolone (SOLU-MEDROL) injection  60 mg Intravenous Daily  . mometasone-formoterol  2 puff  Inhalation BID  . montelukast  10 mg Oral Daily  . pantoprazole  40 mg Oral Daily  . polyethylene glycol  17 g Oral Daily  . potassium chloride SA  60 mEq Oral Daily  . senna  1 tablet Oral BID  . sodium chloride  3 mL Intravenous Q12H  . vancomycin  1,250 mg Intravenous Q12H    Objective: Vital signs in last 24 hours: Temp:  [97.8 F (36.6 C)-98.5 F (36.9 C)] 98.5 F (36.9 C) (03/20 1100) Pulse Rate:  [94-130] 120 (03/20 1300) Resp:  [13-31] 23 (03/20 1300) BP: (86-140)/(47-95) 140/85 mmHg (03/20 1200) SpO2:  [95 %-100 %] 99 % (03/20 1325) Weight:  [90.719 kg (200 lb)] 90.719 kg (200 lb) (03/20 0500)   General appearance: alert, cooperative and no distress Resp: rhonchi bilaterally Cardio: tachycardic GI: normal findings: bowel sounds normal and soft, non-tender  Lab Results  Recent Labs  08/31/13 0345 09/01/13 0527  WBC 12.5* 11.0*  HGB 8.8* 9.0*  HCT 30.9* 31.3*  NA 139 140  K 4.4 4.1  CL 95* 96  CO2 35* 34*  BUN 15 20  CREATININE 0.73 0.69   Liver Panel No results found for this basename: PROT, ALBUMIN, AST, ALT, ALKPHOS, BILITOT, BILIDIR, IBILI,  in the last 72 hours Sedimentation Rate No results found for this  basename: ESRSEDRATE,  in the last 72 hours C-Reactive Protein No results found for this basename: CRP,  in the last 72 hours  Microbiology: Recent Results (from the past 240 hour(s))  CULTURE, BLOOD (ROUTINE X 2)     Status: None   Collection Time    08/29/13  8:00 PM      Result Value Ref Range Status   Specimen Description BLOOD LEFT HAND   Final   Special Requests BOTTLES DRAWN AEROBIC ONLY 10CC   Final   Culture  Setup Time     Final   Value: 08/30/2013 03:46     Performed at [facility]   Culture     Final   Value:        BLOOD CULTURE RECEIVED NO GROWTH TO DATE CULTURE WILL BE HELD FOR 5 DAYS BEFORE ISSUING A FINAL NEGATIVE REPORT     Performed at [facility]   Report Status PENDING   Incomplete  CULTURE, BLOOD  (ROUTINE X 2)     Status: None   Collection Time    08/29/13  8:15 PM      Result Value Ref Range Status   Specimen Description BLOOD LEFT ARM   Final   Special Requests BOTTLES DRAWN AEROBIC ONLY 10CC   Final   Culture  Setup Time     Final   Value: 08/30/2013 02:07     Performed at [facility]   Culture     Final   Value:        BLOOD CULTURE RECEIVED NO GROWTH TO DATE CULTURE WILL BE HELD FOR 5 DAYS BEFORE ISSUING A FINAL NEGATIVE REPORT     Performed at [facility]   Report Status PENDING   Incomplete  MRSA PCR SCREENING     Status: Abnormal   Collection Time    08/29/13  8:49 PM      Result Value Ref Range Status   MRSA by PCR INVALID RESULTS, SPECIMEN SENT FOR CULTURE (*) NEGATIVE Final   Comment: RESULT CALLED TO, READ BACK BY AND VERIFIED WITH:     ASMITH RN AT 0120 ON [id number] BY DLONG                The GeneXpert MRSA Assay (FDA     approved for NASAL specimens     only), is one component of a     comprehensive MRSA colonization     surveillance program. It is not     intended to diagnose MRSA     infection nor to guide or     monitor treatment for     MRSA infections.  MRSA CULTURE     Status: None   Collection Time    08/29/13  8:49 PM      Result Value Ref Range Status   Specimen Description NOSE   Final   Special Requests NONE   Final   Culture     Final   Value: ABUNDANT STAPHYLOCOCCUS AUREUS     Note: NOMRSA     Performed at [facility]   Report Status 09/01/2013 FINAL   Final  RESPIRATORY VIRUS PANEL     Status: None   Collection Time    08/29/13  9:32 PM      Result Value Ref Range Status   Source - RVPAN NOSE   Final   Respiratory Syncytial Virus A NOT DETECTED   Final   Respiratory Syncytial Virus B NOT DETECTED  Final   Influenza A NOT DETECTED   Final   Influenza B NOT DETECTED   Final   Parainfluenza 1 NOT DETECTED   Final   Parainfluenza 2 NOT DETECTED   Final   Parainfluenza 3 NOT DETECTED   Final    Metapneumovirus NOT DETECTED   Final   Rhinovirus NOT DETECTED   Final   Adenovirus NOT DETECTED   Final   Influenza A H1 NOT DETECTED   Final   Influenza A H3 NOT DETECTED   Final   Comment: (NOTE)           Normal Reference Range for each Analyte: NOT DETECTED     Testing performed using the Luminex xTAG Respiratory Viral Panel test     kit.     This test was developed and its performance characteristics determined     by [facility]. It has not been cleared or approved by the [place]     Food and Drug Administration. This test is used for clinical purposes.     It should not be regarded as investigational or for research. This     laboratory is certified under the Clinical Laboratory Improvement     Amendments of 1988 (CLIA) as qualified to perform high complexity     clinical laboratory testing.     Performed at [facility]  CULTURE, RESPIRATORY (NON-EXPECTORATED)     Status: None   Collection Time    08/31/13 12:32 PM      Result Value Ref Range Status   Specimen Description SPUTUM   Final   Special Requests NONE   Final   Gram Stain     Final   Value: NO WBC SEEN     NO SQUAMOUS EPITHELIAL CELLS SEEN     NO ORGANISMS SEEN     Performed at [facility]   Culture     Final   Value: Culture reincubated for better growth     Performed at [facility]   Report Status PENDING   Incomplete  CULTURE, EXPECTORATED SPUTUM-ASSESSMENT     Status: None   Collection Time    08/31/13 12:51 PM      Result Value Ref Range Status   Specimen Description SPUTUM   Final   Special Requests NONE   Final   Sputum evaluation     Final   Value: THIS SPECIMEN IS ACCEPTABLE. RESPIRATORY CULTURE REPORT TO FOLLOW.   Report Status 08/31/2013 FINAL   Final  FUNGUS CULTURE W SMEAR     Status: None   Collection Time    08/31/13 12:51 PM      Result Value Ref Range Status   Specimen Description SPUTUM   Final   Special Requests NONE   Final   Fungal Smear     Final    Value: NO YEAST OR FUNGAL ELEMENTS SEEN     Performed at [facility]   Culture     Final   Value: CULTURE IN PROGRESS FOR FOUR WEEKS     Performed at [facility]   Report Status PENDING   Incomplete  AFB CULTURE WITH SMEAR     Status: None   Collection Time    08/31/13 11:55 PM      Result Value Ref Range Status   Specimen Description SPUTUM   Final   Special Requests Normal   Final   ACID FAST SMEAR     Final   Value: NO  ACID FAST BACILLI SEEN     Performed at [facility]   Culture     Final   Value: CULTURE WILL BE EXAMINED FOR 6 WEEKS BEFORE ISSUING A FINAL REPORT     Performed at [facility]   Report Status PENDING   Incomplete    Studies/Results: No results found.   Assessment/Plan:  Cavitary lung lesion  COPD  BOOP  RA  Constipation  DM2   Total days of antibiotics: 4 vanco/imipenem   Still tachypneic, tachycardic  Bowel hygiene  Quantiferon indeterminant, sputum AFB (-) Await fungal serologies  No change in anbx for now Will d/c isolation          Bobby Rumpf Infectious Diseases (pager) [phone] [email or web address] 09/02/2013, 3:06 PM  LOS: 4 days

## 2013-09-02 NOTE — Progress Notes (Signed)
[facility] PROGRESS NOTE  Jacqueline Washington FYB:[id number] DOB: 29-Jan-1951 DOA: 08/29/2013 PCP: Alesia Richards, MD  Assessment/Plan: Acute Respiratory Failure -Multifactorial: Likely secondary to combination of severe emphysema, RA related lung disease, BOOP, ?aspergiloma in LUL, possible HCAP. 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 -On Vanc and primaxin for ?HCAP. 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 D/c'd on 3/17 -Appreciate pulmonary input.\ - PMT recs noted and appreciated - ID recs noted. Pending quantiferon, fungal, and serum studies  Right Shoulder Pain -Suspected diaphragmatic pain from constipation-->recs noted per PMT  HCAP as above - Cont current abx Initially Suspected Pulmonary Aspergillosis as above. Voriconazole d/c'd per Pulm recs as pt not thought to have aspergillosis  HTN sbp in the 90's overnight. Cont on prn's alone for now  Code Status: DNR Family Communication: Pt in room Disposition Plan: Pending - Pending PMT consult  Consultants:  Pulmonary   Antibiotics:  Vanc 3/16>>>  imipenem  3/16>>>  voriconazole (antifungal) 3/16>>>3/17  Subjective: No acute events overnight. Pt without complaints.  Objective: Filed Vitals:   09/02/13 0000 09/02/13 0200 09/02/13 0400 09/02/13 0500  BP: 91/47 92/50 111/67   Pulse: 104 95 94   Temp: 97.9 F (36.6 C)  97.8 F (36.6 C)   TempSrc: Oral  Oral   Resp: $Remo'13 14 13   'fztfA$ Height:      Weight:    90.719 kg (200 lb)  SpO2: 100% 100% 100%     Intake/Output Summary (Last 24 hours) at 09/02/13 0752 Last data filed at 09/02/13 0400  Gross per 24 hour  Intake   2600 ml  Output   2450 ml  Net    150 ml   Filed Weights   08/31/13 0347 09/01/13 0500 09/02/13 0500  Weight: 92.3 kg (203 lb 7.8 oz) 89.812 kg (198 lb) 90.719 kg (200 lb)    Exam:   General:  AA Ox3  Cardiovascular: RRR  Respiratory: wheezing on R with B decreased BS  Abdomen: S/NT/ND/+BS  Extremities: trace bilateral  edema   Neurologic:  Non-focal  Data Reviewed: Basic Metabolic Panel:  Recent Labs Lab 08/29/13 1308 08/30/13 0320 08/31/13 0345 09/01/13 0527  NA 127* 130* 139 140  K 4.8 4.3 4.4 4.1  CL 82* 90* 95* 96  CO2 31 29 35* 34*  GLUCOSE 173* 174* 128* 124*  BUN $Re'18 13 15 20  'LhG$ CREATININE 0.80 0.63 0.73 0.69  CALCIUM 9.8 9.0 9.2 9.0   Liver Function Tests:  Recent Labs Lab 08/29/13 1308  AST 36  ALT 33  ALKPHOS 108  BILITOT 0.5  PROT 7.4  ALBUMIN 3.4*    Recent Labs Lab 08/29/13 1308  LIPASE 30   No results found for this basename: AMMONIA,  in the last 168 hours CBC:  Recent Labs Lab 08/29/13 1308 08/30/13 0320 08/31/13 0345 09/01/13 0527  WBC 10.1 6.8 12.5* 11.0*  HGB 11.3* 9.6* 8.8* 9.0*  HCT 37.9 32.3* 30.9* 31.3*  MCV 78.3 78.4 80.9 81.1  PLT 218 175 172 165   Cardiac Enzymes:  Recent Labs Lab 08/31/13 1055  TROPONINI <0.30   BNP (last 3 results)  Recent Labs  03/25/13 0601 04/14/13 2300 08/10/13 1554  PROBNP 1150.0* 301.6* 1099.0*   CBG:  Recent Labs Lab 08/31/13 2200 09/01/13 0810 09/01/13 1237 09/01/13 1652 09/01/13 2149  GLUCAP 146* 108* 190* 204* 198*    Recent Results (from the past 240 hour(s))  CULTURE, BLOOD (ROUTINE X 2)     Status: None   Collection Time  08/29/13  8:00 PM      Result Value Ref Range Status   Specimen Description BLOOD LEFT HAND   Final   Special Requests BOTTLES DRAWN AEROBIC ONLY 10CC   Final   Culture  Setup Time     Final   Value: 08/30/2013 03:46     Performed at [facility]   Culture     Final   Value:        BLOOD CULTURE RECEIVED NO GROWTH TO DATE CULTURE WILL BE HELD FOR 5 DAYS BEFORE ISSUING A FINAL NEGATIVE REPORT     Performed at [facility]   Report Status PENDING   Incomplete  CULTURE, BLOOD (ROUTINE X 2)     Status: None   Collection Time    08/29/13  8:15 PM      Result Value Ref Range Status   Specimen Description BLOOD LEFT ARM   Final   Special Requests  BOTTLES DRAWN AEROBIC ONLY 10CC   Final   Culture  Setup Time     Final   Value: 08/30/2013 02:07     Performed at [facility]   Culture     Final   Value:        BLOOD CULTURE RECEIVED NO GROWTH TO DATE CULTURE WILL BE HELD FOR 5 DAYS BEFORE ISSUING A FINAL NEGATIVE REPORT     Performed at [facility]   Report Status PENDING   Incomplete  MRSA PCR SCREENING     Status: Abnormal   Collection Time    08/29/13  8:49 PM      Result Value Ref Range Status   MRSA by PCR INVALID RESULTS, SPECIMEN SENT FOR CULTURE (*) NEGATIVE Final   Comment: RESULT CALLED TO, READ BACK BY AND VERIFIED WITH:     ASMITH RN AT 0120 ON [id number] BY DLONG                The GeneXpert MRSA Assay (FDA     approved for NASAL specimens     only), is one component of a     comprehensive MRSA colonization     surveillance program. It is not     intended to diagnose MRSA     infection nor to guide or     monitor treatment for     MRSA infections.  MRSA CULTURE     Status: None   Collection Time    08/29/13  8:49 PM      Result Value Ref Range Status   Specimen Description NOSE   Final   Special Requests NONE   Final   Culture     Final   Value: ABUNDANT STAPHYLOCOCCUS AUREUS     Note: NOMRSA     Performed at [facility]   Report Status 09/01/2013 FINAL   Final  RESPIRATORY VIRUS PANEL     Status: None   Collection Time    08/29/13  9:32 PM      Result Value Ref Range Status   Source - RVPAN NOSE   Final   Respiratory Syncytial Virus A NOT DETECTED   Final   Respiratory Syncytial Virus B NOT DETECTED   Final   Influenza A NOT DETECTED   Final   Influenza B NOT DETECTED   Final   Parainfluenza 1 NOT DETECTED   Final   Parainfluenza 2 NOT DETECTED   Final   Parainfluenza 3 NOT DETECTED   Final   Metapneumovirus NOT  DETECTED   Final   Rhinovirus NOT DETECTED   Final   Adenovirus NOT DETECTED   Final   Influenza A H1 NOT DETECTED   Final   Influenza A H3 NOT DETECTED   Final    Comment: (NOTE)           Normal Reference Range for each Analyte: NOT DETECTED     Testing performed using the Luminex xTAG Respiratory Viral Panel test     kit.     This test was developed and its performance characteristics determined     by [facility]. It has not been cleared or approved by the [place]     Food and Drug Administration. This test is used for clinical purposes.     It should not be regarded as investigational or for research. This     laboratory is certified under the [place] (CLIA) as qualified to perform high complexity     clinical laboratory testing.     Performed at [place], RESPIRATORY (NON-EXPECTORATED)     Status: None   Collection Time    08/31/13 12:32 PM      Result Value Ref Range Status   Specimen Description SPUTUM   Final   Special Requests NONE   Final   Gram Stain     Final   Value: NO WBC SEEN     NO SQUAMOUS EPITHELIAL CELLS SEEN     NO ORGANISMS SEEN     Performed at [facility]   Culture PENDING   Incomplete   Report Status PENDING   Incomplete  CULTURE, EXPECTORATED SPUTUM-ASSESSMENT     Status: None   Collection Time    08/31/13 12:51 PM      Result Value Ref Range Status   Specimen Description SPUTUM   Final   Special Requests NONE   Final   Sputum evaluation     Final   Value: THIS SPECIMEN IS ACCEPTABLE. RESPIRATORY CULTURE REPORT TO FOLLOW.   Report Status 08/31/2013 FINAL   Final  FUNGUS CULTURE W SMEAR     Status: None   Collection Time    08/31/13 12:51 PM      Result Value Ref Range Status   Specimen Description SPUTUM   Final   Special Requests NONE   Final   Fungal Smear     Final   Value: NO YEAST OR FUNGAL ELEMENTS SEEN     Performed at [facility]   Culture     Final   Value: CULTURE IN PROGRESS FOR FOUR WEEKS     Performed at [facility]   Report Status PENDING   Incomplete  AFB CULTURE WITH SMEAR     Status: None    Collection Time    08/31/13 11:55 PM      Result Value Ref Range Status   Specimen Description SPUTUM   Final   Special Requests Normal   Final   ACID FAST SMEAR     Final   Value: NO ACID FAST BACILLI SEEN     Performed at [facility]   Culture     Final   Value: CULTURE WILL BE EXAMINED FOR 6 WEEKS BEFORE ISSUING A FINAL REPORT     Performed at [facility]   Report Status PENDING   Incomplete     Studies: No results found.  Scheduled Meds: . allopurinol  100  mg Oral Daily  . amLODipine  10 mg Oral Daily  . docusate sodium  100 mg Oral BID  . enoxaparin (LOVENOX) injection  40 mg Subcutaneous Q24H  . feeding supplement (ENSURE COMPLETE)  237 mL Oral BID BM  . furosemide  40 mg Oral Daily  . imipenem-cilastatin  500 mg Intravenous 4 times per day  . insulin aspart  0-15 Units Subcutaneous TID WC  . ipratropium-albuterol  3 mL Nebulization QID  . leflunomide  20 mg Oral Daily  . levothyroxine  50 mcg Oral QAC breakfast  . loratadine  10 mg Oral Daily  . methylPREDNISolone (SOLU-MEDROL) injection  125 mg Intravenous Daily  . mometasone-formoterol  2 puff Inhalation BID  . montelukast  10 mg Oral Daily  . pantoprazole  40 mg Oral Daily  . polyethylene glycol  17 g Oral Daily  . potassium chloride SA  60 mEq Oral Daily  . senna  1 tablet Oral BID  . sodium chloride  3 mL Intravenous Q12H  . vancomycin  1,250 mg Intravenous Q12H   Continuous Infusions:   Active Problems:   Chronic diastolic heart failure, NYHA class 1   Hypothyroidism   Diabetes mellitus, type II   Protein-calorie malnutrition, severe   Physical deconditioning   GERD (gastroesophageal reflux disease)   Chronic respiratory failure   Hyperlipidemia   Hypertension   HCAP (healthcare-associated pneumonia)   Pulmonary aspergillosis    Time spent: 35 minutes. Greater than 50% of this time was spent in direct contact with the patient coordinating care.    Donne Hazel  [facility] Pager [phone]  If 7PM-7AM, please contact night-coverage at [email or web address], password [facility] 09/02/2013, 7:52 AM  LOS: 4 days

## 2013-09-02 NOTE — Progress Notes (Signed)
[id number]/Rhonda Chaffin, RN, BSN, CCM [phone] Chart Reviewed for discharge and hospital needs. Discharge needs at time of review:  None present will follow for needs. Review of patient progress due on [id number]. 

## 2013-09-03 LAB — GLUCOSE, CAPILLARY
GLUCOSE-CAPILLARY: 107 mg/dL — AB (ref 70–99)
Glucose-Capillary: 126 mg/dL — ABNORMAL HIGH (ref 70–99)
Glucose-Capillary: 152 mg/dL — ABNORMAL HIGH (ref 70–99)

## 2013-09-03 LAB — CULTURE, RESPIRATORY W GRAM STAIN

## 2013-09-03 LAB — CULTURE, RESPIRATORY
CULTURE: NORMAL
GRAM STAIN: NONE SEEN

## 2013-09-03 MED ORDER — METHYLPREDNISOLONE SODIUM SUCC 125 MG IJ SOLR
50.0000 mg | Freq: Every day | INTRAMUSCULAR | Status: DC
  Administered 2013-09-04 – 2013-09-06 (×3): 50 mg via INTRAVENOUS
  Filled 2013-09-03 (×4): qty 0.8

## 2013-09-03 NOTE — Progress Notes (Signed)
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 -Critical Care recs for increasing steroids for ?BOOP. -Cont Vanc and primaxin for per ID recs. 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 D/c'd on 3/17 -Appreciate pulmonary input. -PMT recs noted and appreciated for pain and sx mgt -ID recs noted. Now off contact precautions  Right Shoulder Pain -Suspected diaphragmatic pain from constipation-->improved with large BM recently   HCAP as above - Cont current abx  Initially Suspected Pulmonary Aspergillosis as above. Voriconazole d/c'd per Pulm recs as pt not thought to have aspergillosis  HTN bp stable and controlled overnight. On PRN hydralazine alone  Code Status: DNR Family Communication: Pt in room Disposition Plan: Pending - Pending  Consultants:  Pulmonary  ID  PMT  Antibiotics:  Vanc 3/16>>>  imipenem  3/16>>>  voriconazole (antifungal) 3/16>>>3/17  Subjective: No acute events noted. Cont to have bm and flatus. Reports feeling much improved. Eager to go home tomorrow.  Objective: Filed Vitals:   09/02/13 2200 09/03/13 0000 09/03/13 0200 09/03/13 0400  BP: 124/77 112/89 104/64 119/66  Pulse: 115 110 102 96  Temp:  97.6 F (36.4 C)  98.1 F (36.7 C)  TempSrc:  Oral  Oral  Resp: _0 Height:      Weight:    94.8 kg (208 lb 15.9 oz)  SpO2: 100% 100% 100% 100%    Intake/Output Summary (Last 24 hours) at 09/03/13 0756 Last data filed at 09/03/13 2575  Gross per 24 hour  Intake   1300 ml  Output   2525 ml  Net  -1225 ml   Filed Weights   09/01/13 0500 09/02/13 0500 09/03/13 0400  Weight: 89.812 kg (198 lb) 90.719 kg (200 lb) 94.8 kg (208 lb 15.9 oz)    Exam:   General:  AA  Ox3  Cardiovascular: RRR  Respiratory: wheezing on R with B decreased BS  Abdomen: S/NT/ND/+BS  Extremities: trace bilateral edema   Neurologic:  Non-focal  Data Reviewed: Basic Metabolic Panel:  Recent Labs Lab 08/29/13 1308 08/30/13 0320 08/31/13 0345 09/01/13 0527  NA 127* 130* 139 140  K 4.8 4.3 4.4 4.1  CL 82* 90* 95* 96  CO2 31 29 35* 34*  GLUCOSE 173* 174* 128* 124*  BUN _1 CREATININE 0.80 0.63 0.73 0.69  CALCIUM 9.8 9.0 9.2 9.0   Liver Function Tests:  Recent Labs Lab 08/29/13 1308  AST 36  ALT 33  ALKPHOS 108  BILITOT 0.5  PROT 7.4  ALBUMIN 3.4*    Recent Labs Lab 08/29/13 1308  LIPASE 30   No results found for this basename: AMMONIA,  in the last 168 hours CBC:  Recent Labs Lab 08/29/13 1308 08/30/13 0320 08/31/13 0345 09/01/13 0527  WBC 10.1 6.8 12.5* 11.0*  HGB 11.3* 9.6* 8.8* 9.0*  HCT 37.9 32.3* 30.9* 31.3*  MCV 78.3 78.4 80.9 81.1  PLT 218 175 172 165   Cardiac Enzymes:  Recent Labs Lab 08/31/13 1055  TROPONINI <0.30   BNP (last 3 results)  Recent Labs  03/25/13 0601 04/14/13 2300 08/10/13 1554  PROBNP 1150.0* 301.6* 1099.0*   CBG:  Recent Labs Lab 09/01/13 2149 09/02/13 0744 09/02/13 1126 09/02/13 1749 09/02/13 2131  GLUCAP 198* 129* 175* 126* 192*    Recent Results (from the past 240  hour(s))  CULTURE, BLOOD (ROUTINE X 2)     Status: None   Collection Time    08/29/13  8:00 PM      Result Value Ref Range Status   Specimen Description BLOOD LEFT HAND   Final   Special Requests BOTTLES DRAWN AEROBIC ONLY 10CC   Final   Culture  Setup Time     Final   Value: 08/30/2013 03:46     Performed at [facility]   Culture     Final   Value:        BLOOD CULTURE RECEIVED NO GROWTH TO DATE CULTURE WILL BE HELD FOR 5 DAYS BEFORE ISSUING A FINAL NEGATIVE REPORT     Performed at [facility]   Report Status PENDING   Incomplete  CULTURE, BLOOD (ROUTINE X 2)     Status: None    Collection Time    08/29/13  8:15 PM      Result Value Ref Range Status   Specimen Description BLOOD LEFT ARM   Final   Special Requests BOTTLES DRAWN AEROBIC ONLY 10CC   Final   Culture  Setup Time     Final   Value: 08/30/2013 02:07     Performed at [facility]   Culture     Final   Value:        BLOOD CULTURE RECEIVED NO GROWTH TO DATE CULTURE WILL BE HELD FOR 5 DAYS BEFORE ISSUING A FINAL NEGATIVE REPORT     Performed at [facility]   Report Status PENDING   Incomplete  MRSA PCR SCREENING     Status: Abnormal   Collection Time    08/29/13  8:49 PM      Result Value Ref Range Status   MRSA by PCR INVALID RESULTS, SPECIMEN SENT FOR CULTURE (*) NEGATIVE Final   Comment: RESULT CALLED TO, READ BACK BY AND VERIFIED WITH:     ASMITH RN AT 0120 ON [id number] BY DLONG                The GeneXpert MRSA Assay (FDA     approved for NASAL specimens     only), is one component of a     comprehensive MRSA colonization     surveillance program. It is not     intended to diagnose MRSA     infection nor to guide or     monitor treatment for     MRSA infections.  MRSA CULTURE     Status: None   Collection Time    08/29/13  8:49 PM      Result Value Ref Range Status   Specimen Description NOSE   Final   Special Requests NONE   Final   Culture     Final   Value: ABUNDANT STAPHYLOCOCCUS AUREUS     Note: NOMRSA     Performed at [facility]   Report Status 09/01/2013 FINAL   Final  RESPIRATORY VIRUS PANEL     Status: None   Collection Time    08/29/13  9:32 PM      Result Value Ref Range Status   Source - RVPAN NOSE   Final   Respiratory Syncytial Virus A NOT DETECTED   Final   Respiratory Syncytial Virus B NOT DETECTED   Final   Influenza A NOT DETECTED   Final   Influenza B NOT DETECTED   Final   Parainfluenza 1 NOT DETECTED   Final  Parainfluenza 2 NOT DETECTED   Final   Parainfluenza 3 NOT DETECTED   Final   Metapneumovirus NOT DETECTED   Final    Rhinovirus NOT DETECTED   Final   Adenovirus NOT DETECTED   Final   Influenza A H1 NOT DETECTED   Final   Influenza A H3 NOT DETECTED   Final   Comment: (NOTE)           Normal Reference Range for each Analyte: NOT DETECTED     Testing performed using the Luminex xTAG Respiratory Viral Panel test     kit.     This test was developed and its performance characteristics determined     by [facility]. It has not been cleared or approved by the [place]     Food and Drug Administration. This test is used for clinical purposes.     It should not be regarded as investigational or for research. This     laboratory is certified under the [place] (CLIA) as qualified to perform high complexity     clinical laboratory testing.     Performed at [place], RESPIRATORY (NON-EXPECTORATED)     Status: None   Collection Time    08/31/13 12:32 PM      Result Value Ref Range Status   Specimen Description SPUTUM   Final   Special Requests NONE   Final   Gram Stain     Final   Value: NO WBC SEEN     NO SQUAMOUS EPITHELIAL CELLS SEEN     NO ORGANISMS SEEN     Performed at [facility]   Culture     Final   Value: Culture reincubated for better growth     Performed at [facility]   Report Status PENDING   Incomplete  CULTURE, EXPECTORATED SPUTUM-ASSESSMENT     Status: None   Collection Time    08/31/13 12:51 PM      Result Value Ref Range Status   Specimen Description SPUTUM   Final   Special Requests NONE   Final   Sputum evaluation     Final   Value: THIS SPECIMEN IS ACCEPTABLE. RESPIRATORY CULTURE REPORT TO FOLLOW.   Report Status 08/31/2013 FINAL   Final  FUNGUS CULTURE W SMEAR     Status: None   Collection Time    08/31/13 12:51 PM      Result Value Ref Range Status   Specimen Description SPUTUM   Final   Special Requests NONE   Final   Fungal Smear     Final   Value: NO YEAST OR FUNGAL ELEMENTS SEEN      Performed at [facility]   Culture     Final   Value: CULTURE IN PROGRESS FOR FOUR WEEKS     Performed at [facility]   Report Status PENDING   Incomplete  AFB CULTURE WITH SMEAR     Status: None   Collection Time    08/31/13 11:55 PM      Result Value Ref Range Status   Specimen Description SPUTUM   Final   Special Requests Normal   Final   ACID FAST SMEAR     Final   Value: NO ACID FAST BACILLI SEEN     Performed at [facility]   Culture     Final   Value: CULTURE WILL BE EXAMINED FOR 6  WEEKS BEFORE ISSUING A FINAL REPORT     Performed at [facility]   Report Status PENDING   Incomplete     Studies: No results found.  Scheduled Meds: . allopurinol  100 mg Oral Daily  . amLODipine  10 mg Oral Daily  . docusate sodium  100 mg Oral BID  . enoxaparin (LOVENOX) injection  40 mg Subcutaneous Q24H  . feeding supplement (ENSURE COMPLETE)  237 mL Oral BID BM  . furosemide  40 mg Oral Daily  . imipenem-cilastatin  500 mg Intravenous 4 times per day  . insulin aspart  0-15 Units Subcutaneous TID WC  . ipratropium-albuterol  3 mL Nebulization QID  . leflunomide  20 mg Oral Daily  . levothyroxine  50 mcg Oral QAC breakfast  . loratadine  10 mg Oral Daily  . methylPREDNISolone (SOLU-MEDROL) injection  60 mg Intravenous Daily  . mometasone-formoterol  2 puff Inhalation BID  . montelukast  10 mg Oral Daily  . pantoprazole  40 mg Oral Daily  . polyethylene glycol  17 g Oral Daily  . potassium chloride SA  60 mEq Oral Daily  . senna  1 tablet Oral BID  . sodium chloride  3 mL Intravenous Q12H  . vancomycin  1,500 mg Intravenous Q24H  . zolpidem  5 mg Oral Once   Continuous Infusions:   Active Problems:   Chronic diastolic heart failure, NYHA class 1   Hypothyroidism   Diabetes mellitus, type II   Protein-calorie malnutrition, severe   Physical deconditioning   GERD (gastroesophageal reflux disease)   Chronic respiratory failure    Hyperlipidemia   Hypertension   HCAP (healthcare-associated pneumonia)   Pulmonary aspergillosis    Time spent: 35 minutes. Greater than 50% of this time was spent in direct contact with the patient coordinating care.    Donne Hazel  [facility] Pager [phone]  If 7PM-7AM, please contact night-coverage at [email or web address], password [facility] 09/03/2013, 7:56 AM  LOS: 5 days

## 2013-09-03 NOTE — Progress Notes (Signed)
Paged at the request of Ms Lacson for spiritual care. She is concerned about faith issues and wished to speak to a chaplain about the issues.  Ms Furniss is a practicing [employer] who bases her personal and medical insights on the teachings of her faith. It is advisable for Spiritual Care to be part of the inter-professional team when dealing with long range and short range decisions, and any other issue in which medical practices conflict with Ms Doi' faith issues.  Comfort, and prayer given. Discussions concerning miracle healing was undertaken to guide Ms Frankland in her quest of for deeper understanding of her God's will.  Chaplain follow-up indicated.  Benjie Karvonen. Jailyn Langhorst, DMin Chaplain

## 2013-09-03 NOTE — Progress Notes (Signed)
Patient Jacqueline Washington      DOB: 11-18-1950      LXB:[id number]  Emotional support offered to patient and daughter. Patient has slipped further into believing that she will be healed based on her faith.  She stated that the xanax did help and she is grateful for it.  She is very set on going home. Her daughter continues to work with her on her fixed beliefs.   Glover Capano L. Ladona Ridgel, MD MBA The Palliative Medicine Team at [facility] Phone: [phone] Pager: [phone]

## 2013-09-03 NOTE — Progress Notes (Signed)
PULMONARY / CRITICAL CARE MEDICINE   Name: Jacqueline Washington MRN: [id number] DOB: 1950/07/16    ADMISSION DATE:  08/29/2013 CONSULTATION DATE:  3/17  REFERRING MD :  Isaac Bliss PRIMARY SERVICE: TRH  CHIEF COMPLAINT:  Right shoulder and side pain  BRIEF PATIENT DESCRIPTION: 63 y/o female with a complex pulmonary history including COPD and rheumatoid arthritis related lung disease was re-admitted on 3/16 with right shoulder pain and increasing shortness of breath.  SIGNIFICANT EVENTS / STUDIES:  3/16 - CT Angio chest > no pe, severe bilateral emphysema, left effusion unchanged, ? 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; several areas of frank consolidation RUL and LUL 3/17 - Met with Palliative Care, several anxiety attacks  LINES / TUBES: R IJ Tunneled Dual Lumen 3/17>>>  CULTURES: 3/17 sputum bacterial>> 3/17 sputum fungal >>no afb / fungal on prelim>>> 3/16 BCx2>> 3/16 RVP>>neg  ANTIBIOTICS: 3/16 vanc >> 3/16 imipenem>> VFend (pre-admission) >>3/17   SUBJECTIVE:  Sleepy but a bit better Denies dyspnea or pain   VITAL SIGNS: Temp:  [97.6 F (36.4 C)-98.5 F (36.9 C)] 98.5 F (36.9 C) (03/21 0754) Pulse Rate:  [96-144] 112 (03/21 0800) Resp:  [16-33] 24 (03/21 0800) BP: (91-164)/(54-118) 156/90 mmHg (03/21 0800) SpO2:  [91 %-100 %] 100 % (03/21 0800) Weight:  [94.8 kg (208 lb 15.9 oz)] 94.8 kg (208 lb 15.9 oz) (03/21 0400)  INTAKE / OUTPUT: Intake/Output     03/20 0701 - 03/21 0700 03/21 0701 - 03/22 0700   P.O. 120    I.V. (mL/kg) 180 (1.9)    IV Piggyback 1000    Total Intake(mL/kg) 1300 (13.7)    Urine (mL/kg/hr) 2525 (1.1)    Total Output 2525     Net -1225          Stool Occurrence 2 x      PHYSICAL EXAMINATION: Gen: chronically ill appearing, tachypnic but no acute distress HEENT: NCAT, EOMi, OP clear, no exudate or lesions PULM: faint crackles lower posterior bilaterally, few exp wheezes CV: RRR, no mgr, no JVD AB: BS+, soft, mildly  tender RUQ, no hsm Ext: warm, 1-2+ LE edema, no clubbing, no cyanosis Derm: no rash or skin breakdown Neuro: A&Ox4, CN II-XII intact, MAEW   LABS:  CBC  Recent Labs Lab 08/30/13 0320 08/31/13 0345 09/01/13 0527  WBC 6.8 12.5* 11.0*  HGB 9.6* 8.8* 9.0*  HCT 32.3* 30.9* 31.3*  PLT 175 172 165   Coag's No results found for this basename: APTT, INR,  in the last 168 hours BMET  Recent Labs Lab 08/30/13 0320 08/31/13 0345 09/01/13 0527  NA 130* 139 140  K 4.3 4.4 4.1  CL 90* 95* 96  CO2 29 35* 34*  BUN $Re'13 15 20  'LXe$ CREATININE 0.63 0.73 0.69  GLUCOSE 174* 128* 124*   Electrolytes  Recent Labs Lab 08/30/13 0320 08/31/13 0345 09/01/13 0527  CALCIUM 9.0 9.2 9.0   ABG No results found for this basename: PHART, PCO2ART, PO2ART,  in the last 168 hours Liver Enzymes  Recent Labs Lab 08/29/13 1308  AST 36  ALT 33  ALKPHOS 108  BILITOT 0.5  ALBUMIN 3.4*   Cardiac Enzymes  Recent Labs Lab 08/31/13 1055  TROPONINI <0.30   Glucose  Recent Labs Lab 09/01/13 2149 09/02/13 0744 09/02/13 1126 09/02/13 1749 09/02/13 2131 09/03/13 0744  GLUCAP 198* 129* 175* 126* 192* 107*    Imaging  See above  ASSESSMENT / PLAN:  PULMONARY A: COPD (2013 FEV1 1.23 L, 54% pred)  with severe emphysema complicated by rheumatoid arthritis related lung disease.   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  CT images highly suspicious for aspergilloma in LUL vs necrotizing organizing PNA.  The only way to confirm this would be to perform a biopsy which is too dangerous.  Regardless the definitive diagnosis, her overall prognosis from a pulmonary perspective is poor.    Urine strep antigen positive, unclear if this is a false positive.  Prelim AFB, Fungal culture neg.   P:   -agree with increased steroid dose for organizing pneumonia -agree with empiric HCAP coverage, defer to ID for choices here -continue  bronchodilators -wean O2 as indicated -Palliative care following for assistance with narcotics for relief of dyspnea, appreciate input -lasix as ordered  GASTROINTESTINAL A:   R shoulder, epigastric pain> is this referred pain from constipation? RUQ ultrasound negative Constipation from narcotics P:   -continue bowel regimen -Palliative care to help with symptom management.  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 Joya Gaskins for EOL issues and dyspnea relief.   Baltazar Apo, MD, PhD 09/03/2013, 10:45 AM [facility] [phone] or if no answer [phone]

## 2013-09-04 LAB — GLUCOSE, CAPILLARY
GLUCOSE-CAPILLARY: 109 mg/dL — AB (ref 70–99)
GLUCOSE-CAPILLARY: 87 mg/dL (ref 70–99)
GLUCOSE-CAPILLARY: 129 mg/dL — AB (ref 70–99)
Glucose-Capillary: 241 mg/dL — ABNORMAL HIGH (ref 70–99)

## 2013-09-04 NOTE — Progress Notes (Signed)
PULMONARY / CRITICAL CARE MEDICINE   Name: Jacqueline Washington MRN: [id number] DOB: 1951/02/03    ADMISSION DATE:  08/29/2013 CONSULTATION DATE:  3/17  REFERRING MD :  Isaac Bliss PRIMARY SERVICE: TRH  CHIEF COMPLAINT:  Right shoulder and side pain  BRIEF PATIENT DESCRIPTION: 63 y/o female with a complex pulmonary history including COPD and rheumatoid arthritis related lung disease was re-admitted on 3/16 with right shoulder pain and increasing shortness of breath.  SIGNIFICANT EVENTS / STUDIES:  3/16 - CT Angio chest > no pe, severe bilateral emphysema, left effusion unchanged, ? 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; several areas of frank consolidation RUL and LUL 3/17 - Met with Palliative Care, several anxiety attacks  LINES / TUBES: R IJ Tunneled Dual Lumen 3/17>>>  CULTURES: 3/17 sputum bacterial>> 3/17 sputum fungal >>no afb / fungal on prelim>>> 3/16 BCx2>> 3/16 RVP>>neg  ANTIBIOTICS: 3/16 vanc >> 3/16 imipenem>> VFend (pre-admission) >>3/17   SUBJECTIVE:  Up to chair today, feeling better  VITAL SIGNS: Temp:  [97.5 F (36.4 C)-98.2 F (36.8 C)] 98.2 F (36.8 C) (03/22 0400) Pulse Rate:  [98-131] 112 (03/22 0744) Resp:  [19-32] 31 (03/22 0744) BP: (84-150)/(59-81) 134/78 mmHg (03/22 0744) SpO2:  [92 %-100 %] 93 % (03/22 0805) Weight:  [91.1 kg (200 lb 13.4 oz)] 91.1 kg (200 lb 13.4 oz) (03/22 0500)  INTAKE / OUTPUT: Intake/Output     03/21 0701 - 03/22 0700 03/22 0701 - 03/23 0700   P.O. 240    I.V. (mL/kg) 330 (3.6) 30 (0.3)   IV Piggyback 900    Total Intake(mL/kg) 1470 (16.1) 30 (0.3)   Urine (mL/kg/hr) 2530 (1.2)    Total Output 2530     Net -1060 +30        Stool Occurrence 1 x      PHYSICAL EXAMINATION: Gen: chronically ill appearing, unable to speak full sentence due to breathing pattern HEENT: NCAT, EOMi, OP clear, no exudate or lesions PULM: faint crackles lower posterior bilaterally, few exp wheezes CV: RRR, no mgr, no JVD AB:  BS+, soft, mildly tender RUQ, no hsm Ext: warm, 1-2+ LE edema, no clubbing, no cyanosis Derm: no rash or skin breakdown Neuro: A&Ox4, CN II-XII intact, MAEW   LABS:  CBC  Recent Labs Lab 08/30/13 0320 08/31/13 0345 09/01/13 0527  WBC 6.8 12.5* 11.0*  HGB 9.6* 8.8* 9.0*  HCT 32.3* 30.9* 31.3*  PLT 175 172 165   Coag's No results found for this basename: APTT, INR,  in the last 168 hours BMET  Recent Labs Lab 08/30/13 0320 08/31/13 0345 09/01/13 0527  NA 130* 139 140  K 4.3 4.4 4.1  CL 90* 95* 96  CO2 29 35* 34*  BUN $Re'13 15 20  'Zzq$ CREATININE 0.63 0.73 0.69  GLUCOSE 174* 128* 124*   Electrolytes  Recent Labs Lab 08/30/13 0320 08/31/13 0345 09/01/13 0527  CALCIUM 9.0 9.2 9.0   ABG No results found for this basename: PHART, PCO2ART, PO2ART,  in the last 168 hours Liver Enzymes  Recent Labs Lab 08/29/13 1308  AST 36  ALT 33  ALKPHOS 108  BILITOT 0.5  ALBUMIN 3.4*   Cardiac Enzymes  Recent Labs Lab 08/31/13 1055  TROPONINI <0.30   Glucose  Recent Labs Lab 09/02/13 2131 09/03/13 0744 09/03/13 1207 09/03/13 1718 09/03/13 2308 09/04/13 0744  GLUCAP 192* 107* 126* 152* 109* 87    Imaging  See above  ASSESSMENT / PLAN:  PULMONARY A: COPD (2013 FEV1 1.23 L, 54%  pred) with severe emphysema complicated by rheumatoid arthritis related lung disease.   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  CT images highly suspicious for aspergilloma in LUL vs necrotizing organizing PNA.  The only way to confirm this would be to perform a biopsy which is too dangerous.  Regardless the definitive diagnosis, her overall prognosis from a pulmonary perspective is poor.    Urine strep antigen positive, unclear if this is a false positive.  Prelim AFB, Fungal culture neg.   P:   -continue current steroid dose for organizing pneumonia -agree with empiric HCAP coverage, defer to ID for choices here -continue  bronchodilators -wean O2 as indicated -Palliative care following for assistance with narcotics for relief of dyspnea, appreciate input -lasix as ordered  GASTROINTESTINAL A:   R shoulder, epigastric pain> is this referred pain from constipation? RUQ ultrasound negative Constipation from narcotics P:   -continue bowel regimen -Palliative care to help with symptom management.  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 Joya Gaskins for EOL issues and dyspnea relief.   Baltazar Apo, MD, PhD 09/04/2013, 8:58 AM [facility] [phone] or if no answer [phone]

## 2013-09-04 NOTE — Progress Notes (Signed)
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 -Critical Care recs for increasing steroids for ?BOOP. -Cont Vanc and primaxin for per ID recs. 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 D/c'd on 3/17 as pt thought not to have aspergillosis -Appreciate pulmonary input. -PMT recs noted and appreciated for pain and sx mgt -ID recs noted. Now off contact precautions  Right Shoulder Pain -Suspected diaphragmatic pain from constipation-->improved with large BM recently   HCAP as above - Cont current abx  Initially Suspected Pulmonary Aspergillosis as above. Voriconazole d/c'd per Pulm recs as pt not thought to have aspergillosis  HTN bp stable and controlled overnight. On PRN hydralazine alone  Code Status: DNR Family Communication: Pt in room Disposition Plan: Pending - Pending  Consultants:  Pulmonary  ID  PMT  Antibiotics:  Vanc 3/16>>>  imipenem  3/16>>>  voriconazole (antifungal) 3/16>>>3/17  Subjective: No acute events noted.  Feels better. Eager to go home soon  Objective: Filed Vitals:   09/04/13 0200 09/04/13 0400 09/04/13 0449 09/04/13 0500  BP: 132/73 122/65    Pulse: 102 98    Temp:  98.2 F (36.8 C)    TempSrc:  Oral    Resp: 19 19    Height:      Weight:    91.1 kg (200 lb 13.4 oz)  SpO2: 100% 100% 100%     Intake/Output Summary (Last 24 hours) at 09/04/13 0805 Last data filed at 09/04/13 0600  Gross per 24 hour  Intake   1370 ml  Output   2530 ml  Net  -1160 ml   Filed Weights   09/03/13 0400 09/04/13 0015 09/04/13 0500  Weight: 94.8 kg (208 lb 15.9 oz) 91.1 kg (200 lb 13.4 oz) 91.1 kg (200 lb 13.4 oz)    Exam:   General:  AA Ox3  Cardiovascular: RRR  Respiratory:  wheezing on R with B decreased BS  Abdomen: S/NT/ND/+BS  Extremities: trace bilateral edema   Neurologic:  Non-focal  Data Reviewed: Basic Metabolic Panel:  Recent Labs Lab 08/29/13 1308 08/30/13 0320 08/31/13 0345 09/01/13 0527  NA 127* 130* 139 140  K 4.8 4.3 4.4 4.1  CL 82* 90* 95* 96  CO2 31 29 35* 34*  GLUCOSE 173* 174* 128* 124*  BUN $Re'18 13 15 20  'jXk$ CREATININE 0.80 0.63 0.73 0.69  CALCIUM 9.8 9.0 9.2 9.0   Liver Function Tests:  Recent Labs Lab 08/29/13 1308  AST 36  ALT 33  ALKPHOS 108  BILITOT 0.5  PROT 7.4  ALBUMIN 3.4*    Recent Labs Lab 08/29/13 1308  LIPASE 30   No results found for this basename: AMMONIA,  in the last 168 hours CBC:  Recent Labs Lab 08/29/13 1308 08/30/13 0320 08/31/13 0345 09/01/13 0527  WBC 10.1 6.8 12.5* 11.0*  HGB 11.3* 9.6* 8.8* 9.0*  HCT 37.9 32.3* 30.9* 31.3*  MCV 78.3 78.4 80.9 81.1  PLT 218 175 172 165   Cardiac Enzymes:  Recent Labs Lab 08/31/13 1055  TROPONINI <0.30   BNP (last 3 results)  Recent Labs  03/25/13 0601 04/14/13 2300 08/10/13 1554  PROBNP 1150.0* 301.6* 1099.0*   CBG:  Recent Labs Lab 09/02/13 2131 09/03/13 0744 09/03/13 1207 09/03/13 1718 09/03/13 2308  GLUCAP 192* 107* 126* 152* 109*    Recent Results (from the past  240 hour(s))  CULTURE, BLOOD (ROUTINE X 2)     Status: None   Collection Time    08/29/13  8:00 PM      Result Value Ref Range Status   Specimen Description BLOOD LEFT HAND   Final   Special Requests BOTTLES DRAWN AEROBIC ONLY 10CC   Final   Culture  Setup Time     Final   Value: 08/30/2013 03:46     Performed at [facility]   Culture     Final   Value:        BLOOD CULTURE RECEIVED NO GROWTH TO DATE CULTURE WILL BE HELD FOR 5 DAYS BEFORE ISSUING A FINAL NEGATIVE REPORT     Performed at [facility]   Report Status PENDING   Incomplete  CULTURE, BLOOD (ROUTINE X 2)     Status: None   Collection Time    08/29/13  8:15 PM      Result  Value Ref Range Status   Specimen Description BLOOD LEFT ARM   Final   Special Requests BOTTLES DRAWN AEROBIC ONLY 10CC   Final   Culture  Setup Time     Final   Value: 08/30/2013 02:07     Performed at [facility]   Culture     Final   Value:        BLOOD CULTURE RECEIVED NO GROWTH TO DATE CULTURE WILL BE HELD FOR 5 DAYS BEFORE ISSUING A FINAL NEGATIVE REPORT     Performed at [facility]   Report Status PENDING   Incomplete  MRSA PCR SCREENING     Status: Abnormal   Collection Time    08/29/13  8:49 PM      Result Value Ref Range Status   MRSA by PCR INVALID RESULTS, SPECIMEN SENT FOR CULTURE (*) NEGATIVE Final   Comment: RESULT CALLED TO, READ BACK BY AND VERIFIED WITH:     ASMITH RN AT 0120 ON [id number] BY DLONG                The GeneXpert MRSA Assay (FDA     approved for NASAL specimens     only), is one component of a     comprehensive MRSA colonization     surveillance program. It is not     intended to diagnose MRSA     infection nor to guide or     monitor treatment for     MRSA infections.  MRSA CULTURE     Status: None   Collection Time    08/29/13  8:49 PM      Result Value Ref Range Status   Specimen Description NOSE   Final   Special Requests NONE   Final   Culture     Final   Value: ABUNDANT STAPHYLOCOCCUS AUREUS     Note: NOMRSA     Performed at [facility]   Report Status 09/01/2013 FINAL   Final  RESPIRATORY VIRUS PANEL     Status: None   Collection Time    08/29/13  9:32 PM      Result Value Ref Range Status   Source - RVPAN NOSE   Final   Respiratory Syncytial Virus A NOT DETECTED   Final   Respiratory Syncytial Virus B NOT DETECTED   Final   Influenza A NOT DETECTED   Final   Influenza B NOT DETECTED   Final   Parainfluenza 1 NOT DETECTED   Final  Parainfluenza 2 NOT DETECTED   Final   Parainfluenza 3 NOT DETECTED   Final   Metapneumovirus NOT DETECTED   Final   Rhinovirus NOT DETECTED   Final   Adenovirus NOT  DETECTED   Final   Influenza A H1 NOT DETECTED   Final   Influenza A H3 NOT DETECTED   Final   Comment: (NOTE)           Normal Reference Range for each Analyte: NOT DETECTED     Testing performed using the Luminex xTAG Respiratory Viral Panel test     kit.     This test was developed and its performance characteristics determined     by [facility]. It has not been cleared or approved by the [place]     Food and Drug Administration. This test is used for clinical purposes.     It should not be regarded as investigational or for research. This     laboratory is certified under the [place] (CLIA) as qualified to perform high complexity     clinical laboratory testing.     Performed at [place], RESPIRATORY (NON-EXPECTORATED)     Status: None   Collection Time    08/31/13 12:32 PM      Result Value Ref Range Status   Specimen Description SPUTUM   Final   Special Requests NONE   Final   Gram Stain     Final   Value: NO WBC SEEN     NO SQUAMOUS EPITHELIAL CELLS SEEN     NO ORGANISMS SEEN     Performed at [facility]   Culture     Final   Value: NORMAL OROPHARYNGEAL FLORA     Performed at [facility]   Report Status 09/03/2013 FINAL   Final  CULTURE, EXPECTORATED SPUTUM-ASSESSMENT     Status: None   Collection Time    08/31/13 12:51 PM      Result Value Ref Range Status   Specimen Description SPUTUM   Final   Special Requests NONE   Final   Sputum evaluation     Final   Value: THIS SPECIMEN IS ACCEPTABLE. RESPIRATORY CULTURE REPORT TO FOLLOW.   Report Status 08/31/2013 FINAL   Final  FUNGUS CULTURE W SMEAR     Status: None   Collection Time    08/31/13 12:51 PM      Result Value Ref Range Status   Specimen Description SPUTUM   Final   Special Requests NONE   Final   Fungal Smear     Final   Value: NO YEAST OR FUNGAL ELEMENTS SEEN     Performed at [facility]   Culture      Final   Value: CULTURE IN PROGRESS FOR FOUR WEEKS     Performed at [facility]   Report Status PENDING   Incomplete  AFB CULTURE WITH SMEAR     Status: None   Collection Time    08/31/13 11:55 PM      Result Value Ref Range Status   Specimen Description SPUTUM   Final   Special Requests Normal   Final   ACID FAST SMEAR     Final   Value: NO ACID FAST BACILLI SEEN     Performed at [facility]   Culture     Final   Value: CULTURE WILL BE EXAMINED FOR 6 WEEKS  BEFORE ISSUING A FINAL REPORT     Performed at [facility]   Report Status PENDING   Incomplete     Studies: No results found.  Scheduled Meds: . allopurinol  100 mg Oral Daily  . amLODipine  10 mg Oral Daily  . docusate sodium  100 mg Oral BID  . enoxaparin (LOVENOX) injection  40 mg Subcutaneous Q24H  . feeding supplement (ENSURE COMPLETE)  237 mL Oral BID BM  . furosemide  40 mg Oral Daily  . imipenem-cilastatin  500 mg Intravenous 4 times per day  . insulin aspart  0-15 Units Subcutaneous TID WC  . ipratropium-albuterol  3 mL Nebulization QID  . leflunomide  20 mg Oral Daily  . levothyroxine  50 mcg Oral QAC breakfast  . loratadine  10 mg Oral Daily  . methylPREDNISolone (SOLU-MEDROL) injection  50 mg Intravenous Daily  . mometasone-formoterol  2 puff Inhalation BID  . montelukast  10 mg Oral Daily  . pantoprazole  40 mg Oral Daily  . polyethylene glycol  17 g Oral Daily  . potassium chloride SA  60 mEq Oral Daily  . senna  1 tablet Oral BID  . sodium chloride  3 mL Intravenous Q12H  . vancomycin  1,500 mg Intravenous Q24H  . zolpidem  5 mg Oral Once   Continuous Infusions:   Active Problems:   Chronic diastolic heart failure, NYHA class 1   Hypothyroidism   Diabetes mellitus, type II   Protein-calorie malnutrition, severe   Physical deconditioning   GERD (gastroesophageal reflux disease)   Chronic respiratory failure   Hyperlipidemia   Hypertension   HCAP  (healthcare-associated pneumonia)   Pulmonary aspergillosis    Time spent: 35 minutes. Greater than 50% of this time was spent in direct contact with the patient coordinating care.    Donne Hazel  [facility] Pager [phone]  If 7PM-7AM, please contact night-coverage at [email or web address], password [facility] 09/04/2013, 8:05 AM  LOS: 6 days

## 2013-09-04 NOTE — Progress Notes (Signed)
Chaplain was paged at 2047 to see Ms Jacqueline Washington. Chaplain was dealing with the aftermath of a Code Blue with the same unit (2 [facility] ICU). He hurriedly met with Ms Jacqueline Washington briefly with a promise to return a bit later and it was agreed.  Met with Ms Jacqueline Washington who wished only to thank the chaplain for a prayer prayed at he last visit. She wished for the prayer to be written out because it meant so much to her. The prayer dealt with seeing those that care for her, like her God, are looking after her best interest. Although she is still praying for a miracle to be fully healed, she now has had a spiritual experience that has given her the peace to live, under care, without fretting about may come. She is now more open to speaking of alternatives to miracle healing if that be God's will.   Ms Jacqueline Washington reports that the relaxation exercise and meditation that the chaplain has guided her through helped her to have less panic, breathe easier, and find her spiritual center. Patient spoke of her conversations with Dr Lovena Le concerning palliative care. She is focused on listening rather than resisting what is being told her. Her spiritual focus in this matter to to live as long as she is able, and having what quality of life she can have. It is not that she has stopped believing in miracles, it is that she has broadened her definition of what a miracles.  Ms Jacqueline Washington is giving out flowers as an act of appreciation for Godly loving care. Chaplain was given a flower, which was accepted with humble thanks. Others on the staff are being given flowers as well.  Chaplain joins Dr Lovena Le in the assessment that Ms Jacqueline Washington is a social extrovert, who will adapt to any situation in which she is given quality care.  Due to her religious beliefs she sees those giving dishonest and substandard care as demonic powers which she must resist. Given the high standard of care given in [place] and by the Palliative Care Team, there is little to no reason  for [place] to be labeled "demonic agents."  Sallee Lange. Dylynn Ketner, DMin, MDiv, MA, BSW Chaplain

## 2013-09-04 NOTE — Progress Notes (Signed)
Patient RJ:JOACZYS Jacqueline Washington      DOB: [birth year]      AYT:[id number]  Patient up in chair waved from room to me in hallway.  Currently , entertaining guests. She continues to resist decision making about long term care. Will reapproach one more time.  Reviewed with Dr. Delford Field that out only helpful recommendation would be to have Palliative Care service from [facility] do home visits and help patient transition to home hospice through their services with her.  She is a relational person and will likely see the value once placed in the therapeutic relationship.  She remains in control of medications for comfort and has been using her xanax which has allowed her to sleep and make better choices.  Bryan Goin L. Ladona Ridgel, MD MBA The Palliative Medicine Team at [facility] Phone: [phone] Pager: [phone]

## 2013-09-05 ENCOUNTER — Other Ambulatory Visit: Payer: Medicare Other | Admitting: Physician Assistant

## 2013-09-05 ENCOUNTER — Inpatient Hospital Stay (HOSPITAL_COMMUNITY): Payer: Medicare Other

## 2013-09-05 DIAGNOSIS — R079 Chest pain, unspecified: Secondary | ICD-10-CM

## 2013-09-05 LAB — CBC
HEMATOCRIT: 28.8 % — AB (ref 36.0–46.0)
HEMOGLOBIN: 8.1 g/dL — AB (ref 12.0–15.0)
MCH: 23.3 pg — ABNORMAL LOW (ref 26.0–34.0)
MCHC: 28.1 g/dL — ABNORMAL LOW (ref 30.0–36.0)
MCV: 82.8 fL (ref 78.0–100.0)
Platelets: 112 10*3/uL — ABNORMAL LOW (ref 150–400)
RBC: 3.48 MIL/uL — ABNORMAL LOW (ref 3.87–5.11)
RDW: 23.2 % — ABNORMAL HIGH (ref 11.5–15.5)
WBC: 5.4 10*3/uL (ref 4.0–10.5)

## 2013-09-05 LAB — GLUCOSE, CAPILLARY
GLUCOSE-CAPILLARY: 102 mg/dL — AB (ref 70–99)
GLUCOSE-CAPILLARY: 79 mg/dL (ref 70–99)
GLUCOSE-CAPILLARY: 189 mg/dL — AB (ref 70–99)
GLUCOSE-CAPILLARY: 167 mg/dL — AB (ref 70–99)
Glucose-Capillary: 134 mg/dL — ABNORMAL HIGH (ref 70–99)

## 2013-09-05 LAB — CULTURE, BLOOD (ROUTINE X 2)
CULTURE: NO GROWTH
Culture: NO GROWTH

## 2013-09-05 LAB — FUNGAL ANTIBODIES PANEL, ID-BLOOD
ASPERGILLUS FUMIGATUS: POSITIVE
Aspergillus Flavus Antibodies: NEGATIVE
Aspergillus Niger Antibodies: NEGATIVE
BLASTOMYCES ABS, QN, DID: NEGATIVE
Coccidioides Antibody ID: NEGATIVE
HISTOPLASMA AB ID: NEGATIVE

## 2013-09-05 LAB — BASIC METABOLIC PANEL
BUN: 19 mg/dL (ref 6–23)
CO2: 34 mEq/L — ABNORMAL HIGH (ref 19–32)
Calcium: 9 mg/dL (ref 8.4–10.5)
Chloride: 102 mEq/L (ref 96–112)
Creatinine, Ser: 0.57 mg/dL (ref 0.50–1.10)
GFR calc Af Amer: >90 mL/min (ref >90)
GFR calc non Af Amer: >90 mL/min (ref >90)
GLUCOSE: 91 mg/dL (ref 70–99)
POTASSIUM: 3.5 meq/L — AB (ref 3.7–5.3)
Sodium: 144 mEq/L (ref 137–147)

## 2013-09-05 LAB — VANCOMYCIN, TROUGH: VANCOMYCIN TR: 9.7 ug/mL — AB (ref 10.0–20.0)

## 2013-09-05 MED ORDER — VANCOMYCIN HCL IN DEXTROSE 1-5 GM/200ML-% IV SOLN
1000.0000 mg | Freq: Two times a day (BID) | INTRAVENOUS | Status: DC
  Administered 2013-09-05 – 2013-09-08 (×6): 1000 mg via INTRAVENOUS
  Filled 2013-09-05 (×8): qty 200

## 2013-09-05 NOTE — Progress Notes (Signed)
Chaplain follow up for continued spiritual support.   Belva Crome MDiv

## 2013-09-05 NOTE — Progress Notes (Signed)
PULMONARY / CRITICAL CARE MEDICINE   Name: Jacqueline Washington MRN: [id number] DOB: [birth year]    ADMISSION DATE:  08/29/2013 CONSULTATION DATE:  3/17  REFERRING MD :  Isaac Bliss PRIMARY SERVICE: TRH  CHIEF COMPLAINT:  Right shoulder and side pain  BRIEF PATIENT DESCRIPTION: 63 y/o female with a complex pulmonary history including COPD and rheumatoid arthritis related lung disease was re-admitted on 3/16 with right shoulder pain and increasing shortness of breath.  SIGNIFICANT EVENTS / STUDIES:  3/16 - CT Angio chest > no pe, severe bilateral emphysema, left effusion unchanged, ? 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; several areas of frank consolidation RUL and LUL 3/17 - Met with Palliative Care, several anxiety attacks 3/23 NAD at rest. LINES / TUBES: R IJ Tunneled Dual Lumen 3/17>>>  CULTURES: 3/17 sputum bacterial>>nl flora 3/17 sputum fungal >>no afb / fungal on prelim>>> 3/16 BCx2>>neg 3/16 RVP>>neg  ANTIBIOTICS: 3/16 vanc >> 3/16 imipenem>> VFend (pre-admission) >>3/17   SUBJECTIVE:  Up to chair today, eating  VITAL SIGNS: Temp:  [98.2 F (36.8 C)-99.6 F (37.6 C)] 99.6 F (37.6 C) (03/23 0400) Pulse Rate:  [105-129] 123 (03/23 0914) Resp:  [22-37] 30 (03/23 0914) BP: (115-141)/(58-82) 125/69 mmHg (03/23 0914) SpO2:  [94 %-100 %] 95 % (03/23 0914) Weight:  [92.5 kg (203 lb 14.8 oz)] 92.5 kg (203 lb 14.8 oz) (03/23 0400)  INTAKE / OUTPUT: Intake/Output     03/22 0701 - 03/23 0700 03/23 0701 - 03/24 0700   P.O.     I.V. (mL/kg) 261.8 (2.8) 98.2 (1.1)   IV Piggyback 800    Total Intake(mL/kg) 1061.8 (11.5) 98.2 (1.1)   Urine (mL/kg/hr) 2450 (1.1) 225 (0.7)   Total Output 2450 225   Net -1388.2 -126.8          PHYSICAL EXAMINATION: Gen: chronically ill appearing, communicates well, cushingoid apperance HEENT: No JVD/LAN PULM: diminished in bases CV: RRR, no mgr, no JVD AB: BS+, soft, mildly tender RUQ, no hsm Ext: warm, 1-2+ LE edema, no  clubbing, no cyanosis Derm: no rash or skin breakdown Neuro: A&Ox4, CN II-XII intact, MAEW   LABS:  CBC  Recent Labs Lab 08/31/13 0345 09/01/13 0527 09/05/13 0525  WBC 12.5* 11.0* 5.4  HGB 8.8* 9.0* 8.1*  HCT 30.9* 31.3* 28.8*  PLT 172 165 112*   Coag's No results found for this basename: APTT, INR,  in the last 168 hours BMET  Recent Labs Lab 08/31/13 0345 09/01/13 0527 09/05/13 0525  NA 139 140 144  K 4.4 4.1 3.5*  CL 95* 96 102  CO2 35* 34* 34*  BUN $Re'15 20 19  'yVb$ CREATININE 0.73 0.69 0.57  GLUCOSE 128* 124* 91   Electrolytes  Recent Labs Lab 08/31/13 0345 09/01/13 0527 09/05/13 0525  CALCIUM 9.2 9.0 9.0   ABG No results found for this basename: PHART, PCO2ART, PO2ART,  in the last 168 hours Liver Enzymes  Recent Labs Lab 08/29/13 1308  AST 36  ALT 33  ALKPHOS 108  BILITOT 0.5  ALBUMIN 3.4*   Cardiac Enzymes  Recent Labs Lab 08/31/13 1055  TROPONINI <0.30   Glucose  Recent Labs Lab 09/03/13 2308 09/04/13 0744 09/04/13 1207 09/04/13 1714 09/04/13 2218 09/05/13 0808  GLUCAP 109* 87 129* 241* 102* 79    Imaging  See above  ASSESSMENT / PLAN:  PULMONARY A: COPD (2013 FEV1 1.23 L, 54% pred) with severe emphysema complicated by rheumatoid arthritis related lung disease.   Concern is that she has organizing pneumonia  as well as cavitary change related to the RA.  CT images highly suspicious for necrotizing organizing PNA.  Bx riksly  Regardless the definitive diagnosis, her overall prognosis from a pulmonary perspective is poor.    Urine strep antigen positive, unclear if this is a false positive.  Prelim AFB, Fungal culture neg.   P:   -continue current steroid dose for organizing pneumonia -agree with empiric HCAP coverage, defer to ID for choices here -continue bronchodilators -wean O2 as indicated -Palliative care following for assistance with narcotics for relief of dyspnea, appreciate input -lasix as  needed  GASTROINTESTINAL A:   R shoulder, epigastric pain> is this referred pain from constipation? RUQ ultrasound negative Constipation from narcotics P:   -continue bowel regimen -Palliative care to help with symptom management.  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 Joya Gaskins for EOL issues and dyspnea relief.   Richardson Landry Minor ACNP [facility] Shape PCCM Pager [phone] till 3 pm If no answer page [phone] 09/05/2013, 10:47 AM   STAFF NOTE Sitting and talking on phone. Keep in  SDU for today. No family at bedside. Rx as necrotizing pneumonia. This does not look like aspergillus pneumonia   Dr. Brand Males, M.D., [facility]C.P Pulmonary and Critical Care Medicine Staff Physician [facility] Pager: [phone], If no answer or between  15:00h - 7:00h: call [phone]  09/05/2013 11:22 AM

## 2013-09-05 NOTE — Progress Notes (Signed)
ANTIBIOTIC CONSULT NOTE - Follow up  Pharmacy Consult for Vancomycin, Primaxin Indication: pneumonia   Allergies  Allergen Reactions  . Chantix [Varenicline] Other (See Comments)    Unknown " blurry vision"  . Hctz [Hydrochlorothiazide] Other (See Comments)    unknown  . Penicillins Rash  . Sulfonamide Derivatives Rash  . Tetanus Toxoid Other (See Comments)    Knot on arm    Patient Measurements: Height: 5\' 4"  (162.6 cm) Weight: 203 lb 14.8 oz (92.5 kg) IBW/kg (Calculated) : 54.7  Vital Signs: Temp: 99.6 F (37.6 C) (03/23 0400) Temp src: Oral (03/23 0400) BP: 125/69 mmHg (03/23 0914) Pulse Rate: 123 (03/23 0914)  Labs:  Recent Labs  09/05/13 0525  WBC 5.4  HGB 8.1*  PLT 112*  CREATININE 0.57   Estimated Creatinine Clearance: 80.3 ml/min (by C-G formula based on Cr of 0.57).  Assessment: 33 yoF [facility] 3[facility]16 with abdominal and chest pain and SOB, now with acute on chronic respiratory failure in setting of RA related pulmonary fibrosis, underlying emphysema, and progressive fibrocavitary lung disease with HCAP.  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  Pharmacy is consulted to dose vancomycin and Primaxin. Voriconazole recently started for aspergillosis, discontinued 3/17.   ID is following  FYI: Antibiotics on previous admission (2/25 - 3/3 ) included vanc/aztreonam transitioned to levofloxacin and voriconazole was added for r/o pulmonary Aspergillosis. 2/25 Aspergillus galactomannan Ag not detected.  D5 Antibiotics PTA 2/26 >> voriconazole >> 3/17 3/16 >> Vanc >> 3/16 >> Primaxin >>   Tmax: Afeb WBCs: 5.4, improved now WNL Renal: SCr 0.57, stable, CrCl 46ml/min (N >100), UOP 1.1/kg/hr  Microbiology 3/16 Resp viral panel: negative 3/16 blood x2: NGTD 3/16 MRSA culture: pending 3/16 Strep pneumo + / Legionella - 3/18 Fungus culture with smear: in process 3/17 Fungal  antibodies: in process 3/17: Quantiferon TB gold: in process 3/18: sputum: reincubated  Drug level / dose changes info: 3/18: VT at 0330 = 25.6 mg/l on 1g IV q8h, adjusted Vancomycin to 1250mg  q12h 3/20 VT at 1721 = 23.6 - Vanc adjusted from 1250mg  IV q12 to 1500mg  IV q24   Goal of Therapy:  Vancomycin trough level 15-20 mcg/ml Eradication of infection  Plan:  1) Continue vancomycin 1500mg  IV q24 for now 2) Continue Primaxin 500mg  IV q6 3) Will go ahead and recheck another trough tonight prior to 4th dose after last dosage change due to elevated trough and change dose as necessary 4) Await ID decision on antibiotics   , PharmD, BCPS Pager [phone] 09/05/2013 10:24 AM

## 2013-09-05 NOTE — Progress Notes (Signed)
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 -Critical Care recs for increasing steroids for ?BOOP. -Per ID, on Vanc and primaxin -Was on Voriconazole for ?pulmunary aspergillosis. D/c'd on 3/17 as pt thought not to have aspergillosis -Appreciate pulmonary input. -PMT recs noted and appreciated for pain and sx mgt -Now off contact precautions  Right Shoulder Pain -Suspected diaphragmatic pain from constipation-->improved with large BM   HCAP as above - Cont current abx  Initially Suspected Pulmonary Aspergillosis as above. Voriconazole d/c'd per Pulm recs as pt not thought to have aspergillosis  HTN bp stable and controlled overnight. On PRN hydralazine alone  Code Status: DNR Family Communication: Pt in room Disposition Plan: Pending - Pending  Consultants:  Pulmonary  ID  PMT  Antibiotics:  Vanc 3/16>>>  imipenem  3/16>>>  voriconazole (antifungal) 3/16>>>3/17  Subjective: No acute events noted. Pt continues to feel better.  Objective: Filed Vitals:   09/04/13 2152 09/05/13 0000 09/05/13 0400 09/05/13 0500  BP: 115/58 127/60  141/64  Pulse: 129 112  105  Temp:  98.9 F (37.2 C) 99.6 F (37.6 C)   TempSrc:  Oral Oral   Resp: 33 25  22  Height:      Weight:   92.5 kg (203 lb 14.8 oz)   SpO2: 100% 97%  97%    Intake/Output Summary (Last 24 hours) at 09/05/13 0804 Last data filed at 09/05/13 0600  Gross per 24 hour  Intake 1031.84 ml  Output   2450 ml  Net -1418.16 ml   Filed Weights   09/04/13 0015 09/04/13 0500 09/05/13 0400  Weight: 91.1 kg (200 lb 13.4 oz) 91.1 kg (200 lb 13.4 oz) 92.5 kg (203 lb 14.8 oz)    Exam:   General:  AA Ox3  Cardiovascular: RRR  Respiratory: wheezing  on R with B decreased BS  Abdomen: S/NT/ND/+BS  Extremities: trace bilateral edema   Neurologic:  Non-focal  Data Reviewed: Basic Metabolic Panel:  Recent Labs Lab 08/29/13 1308 08/30/13 0320 08/31/13 0345 09/01/13 0527 09/05/13 0525  NA 127* 130* 139 140 144  K 4.8 4.3 4.4 4.1 3.5*  CL 82* 90* 95* 96 102  CO2 31 29 35* 34* 34*  GLUCOSE 173* 174* 128* 124* 91  BUN _0 CREATININE 0.80 0.63 0.73 0.69 0.57  CALCIUM 9.8 9.0 9.2 9.0 9.0   Liver Function Tests:  Recent Labs Lab 08/29/13 1308  AST 36  ALT 33  ALKPHOS 108  BILITOT 0.5  PROT 7.4  ALBUMIN 3.4*    Recent Labs Lab 08/29/13 1308  LIPASE 30   No results found for this basename: AMMONIA,  in the last 168 hours CBC:  Recent Labs Lab 08/29/13 1308 08/30/13 0320 08/31/13 0345 09/01/13 0527 09/05/13 0525  WBC 10.1 6.8 12.5* 11.0* 5.4  HGB 11.3* 9.6* 8.8* 9.0* 8.1*  HCT 37.9 32.3* 30.9* 31.3* 28.8*  MCV 78.3 78.4 80.9 81.1 82.8  PLT 218 175 172 165 112*   Cardiac Enzymes:  Recent Labs Lab 08/31/13 1055  TROPONINI <0.30   BNP (last 3 results)  Recent Labs  03/25/13 0601 04/14/13 2300 08/10/13 1554  PROBNP 1150.0* 301.6* 1099.0*   CBG:  Recent Labs Lab 09/03/13 2308 09/04/13 0744 09/04/13 1207 09/04/13 1714 09/04/13 2218  GLUCAP 109* 87 129* 241* 102*  Recent Results (from the past 240 hour(s))  CULTURE, BLOOD (ROUTINE X 2)     Status: None   Collection Time    08/29/13  8:00 PM      Result Value Ref Range Status   Specimen Description BLOOD LEFT HAND   Final   Special Requests BOTTLES DRAWN AEROBIC ONLY 10CC   Final   Culture  Setup Time     Final   Value: 08/30/2013 03:46     Performed at [facility]   Culture     Final   Value:        BLOOD CULTURE RECEIVED NO GROWTH TO DATE CULTURE WILL BE HELD FOR 5 DAYS BEFORE ISSUING A FINAL NEGATIVE REPORT     Performed at [facility]   Report Status PENDING   Incomplete  CULTURE, BLOOD (ROUTINE X  2)     Status: None   Collection Time    08/29/13  8:15 PM      Result Value Ref Range Status   Specimen Description BLOOD LEFT ARM   Final   Special Requests BOTTLES DRAWN AEROBIC ONLY 10CC   Final   Culture  Setup Time     Final   Value: 08/30/2013 02:07     Performed at [facility]   Culture     Final   Value:        BLOOD CULTURE RECEIVED NO GROWTH TO DATE CULTURE WILL BE HELD FOR 5 DAYS BEFORE ISSUING A FINAL NEGATIVE REPORT     Performed at [facility]   Report Status PENDING   Incomplete  MRSA PCR SCREENING     Status: Abnormal   Collection Time    08/29/13  8:49 PM      Result Value Ref Range Status   MRSA by PCR INVALID RESULTS, SPECIMEN SENT FOR CULTURE (*) NEGATIVE Final   Comment: RESULT CALLED TO, READ BACK BY AND VERIFIED WITH:     ASMITH RN AT 0120 ON [id number] BY DLONG                The GeneXpert MRSA Assay (FDA     approved for NASAL specimens     only), is one component of a     comprehensive MRSA colonization     surveillance program. It is not     intended to diagnose MRSA     infection nor to guide or     monitor treatment for     MRSA infections.  MRSA CULTURE     Status: None   Collection Time    08/29/13  8:49 PM      Result Value Ref Range Status   Specimen Description NOSE   Final   Special Requests NONE   Final   Culture     Final   Value: ABUNDANT STAPHYLOCOCCUS AUREUS     Note: NOMRSA     Performed at [facility]   Report Status 09/01/2013 FINAL   Final  RESPIRATORY VIRUS PANEL     Status: None   Collection Time    08/29/13  9:32 PM      Result Value Ref Range Status   Source - RVPAN NOSE   Final   Respiratory Syncytial Virus A NOT DETECTED   Final   Respiratory Syncytial Virus B NOT DETECTED   Final   Influenza A NOT DETECTED   Final   Influenza B NOT DETECTED   Final   Parainfluenza 1 NOT  DETECTED   Final   Parainfluenza 2 NOT DETECTED   Final   Parainfluenza 3 NOT DETECTED   Final   Metapneumovirus NOT  DETECTED   Final   Rhinovirus NOT DETECTED   Final   Adenovirus NOT DETECTED   Final   Influenza A H1 NOT DETECTED   Final   Influenza A H3 NOT DETECTED   Final   Comment: (NOTE)           Normal Reference Range for each Analyte: NOT DETECTED     Testing performed using the Luminex xTAG Respiratory Viral Panel test     kit.     This test was developed and its performance characteristics determined     by [facility]. It has not been cleared or approved by the [place]     Food and Drug Administration. This test is used for clinical purposes.     It should not be regarded as investigational or for research. This     laboratory is certified under the [place] (CLIA) as qualified to perform high complexity     clinical laboratory testing.     Performed at [place], RESPIRATORY (NON-EXPECTORATED)     Status: None   Collection Time    08/31/13 12:32 PM      Result Value Ref Range Status   Specimen Description SPUTUM   Final   Special Requests NONE   Final   Gram Stain     Final   Value: NO WBC SEEN     NO SQUAMOUS EPITHELIAL CELLS SEEN     NO ORGANISMS SEEN     Performed at [facility]   Culture     Final   Value: NORMAL OROPHARYNGEAL FLORA     Performed at [facility]   Report Status 09/03/2013 FINAL   Final  CULTURE, EXPECTORATED SPUTUM-ASSESSMENT     Status: None   Collection Time    08/31/13 12:51 PM      Result Value Ref Range Status   Specimen Description SPUTUM   Final   Special Requests NONE   Final   Sputum evaluation     Final   Value: THIS SPECIMEN IS ACCEPTABLE. RESPIRATORY CULTURE REPORT TO FOLLOW.   Report Status 08/31/2013 FINAL   Final  FUNGUS CULTURE W SMEAR     Status: None   Collection Time    08/31/13 12:51 PM      Result Value Ref Range Status   Specimen Description SPUTUM   Final   Special Requests NONE   Final   Fungal Smear     Final   Value: NO YEAST OR FUNGAL  ELEMENTS SEEN     Performed at [facility]   Culture     Final   Value: CULTURE IN PROGRESS FOR FOUR WEEKS     Performed at [facility]   Report Status PENDING   Incomplete  AFB CULTURE WITH SMEAR     Status: None   Collection Time    08/31/13 11:55 PM      Result Value Ref Range Status   Specimen Description SPUTUM   Final   Special Requests Normal   Final   ACID FAST SMEAR     Final   Value: NO ACID FAST BACILLI SEEN     Performed at [facility]   Culture     Final   Value: CULTURE  WILL BE EXAMINED FOR 6 WEEKS BEFORE ISSUING A FINAL REPORT     Performed at [facility]   Report Status PENDING   Incomplete     Studies: Dg Chest Port 1 View  09/05/2013   CLINICAL DATA:  Evaluate infiltrates.  EXAM: PORTABLE CHEST - 1 VIEW  COMPARISON:  Chest x-ray 08/29/2013.  FINDINGS: As with numerous prior examinations there are extensive irregular masslike areas of architectural distortion throughout the lungs bilaterally, particularly in the left mid to upper lung and in the right apex. No new acute consolidative airspace disease. No pleural effusions. No evidence of pulmonary edema. Heart size is normal. Upper mediastinal contours are grossly distorted, but similar to prior studies. Right internal jugular central venous catheter with tip terminating in the superior aspect of the right atrium.  IMPRESSION: 1. Appearance of the chest is similar to numerous prior examinations demonstrating extensive chronic scarring, as above. No definite acute cardiopulmonary disease is identified at this time.   Electronically Signed   By: Vinnie Langton M.D.   On: 09/05/2013 05:37    Scheduled Meds: . allopurinol  100 mg Oral Daily  . amLODipine  10 mg Oral Daily  . docusate sodium  100 mg Oral BID  . enoxaparin (LOVENOX) injection  40 mg Subcutaneous Q24H  . feeding supplement (ENSURE COMPLETE)  237 mL Oral BID BM  . furosemide  40 mg Oral Daily  . imipenem-cilastatin  500  mg Intravenous 4 times per day  . insulin aspart  0-15 Units Subcutaneous TID WC  . ipratropium-albuterol  3 mL Nebulization QID  . leflunomide  20 mg Oral Daily  . levothyroxine  50 mcg Oral QAC breakfast  . loratadine  10 mg Oral Daily  . methylPREDNISolone (SOLU-MEDROL) injection  50 mg Intravenous Daily  . mometasone-formoterol  2 puff Inhalation BID  . montelukast  10 mg Oral Daily  . pantoprazole  40 mg Oral Daily  . polyethylene glycol  17 g Oral Daily  . potassium chloride SA  60 mEq Oral Daily  . senna  1 tablet Oral BID  . sodium chloride  3 mL Intravenous Q12H  . vancomycin  1,500 mg Intravenous Q24H  . zolpidem  5 mg Oral Once   Continuous Infusions:   Active Problems:   Chronic diastolic heart failure, NYHA class 1   Hypothyroidism   Diabetes mellitus, type II   Protein-calorie malnutrition, severe   Physical deconditioning   GERD (gastroesophageal reflux disease)   Chronic respiratory failure   Hyperlipidemia   Hypertension   HCAP (healthcare-associated pneumonia)   Pulmonary aspergillosis  Time spent: 35 minutes. Greater than 50% of this time was spent in direct contact with the patient coordinating care.  Donne Hazel  [facility] Pager [phone]  If 7PM-7AM, please contact night-coverage at [email or web address], password [facility] 09/05/2013, 8:04 AM  LOS: 7 days

## 2013-09-05 NOTE — Progress Notes (Signed)
INFECTIOUS DISEASE PROGRESS NOTE  ID: Jacqueline Washington is a 63 y.o. female with  Active Problems:   Chronic diastolic heart failure, NYHA class 1   Hypothyroidism   Diabetes mellitus, type II   Protein-calorie malnutrition, severe   Physical deconditioning   GERD (gastroesophageal reflux disease)   Chronic respiratory failure   Hyperlipidemia   Hypertension   HCAP (healthcare-associated pneumonia)   Pulmonary aspergillosis  Subjective: Feels better, bowels moving.   Abtx:  Anti-infectives   Start     Dose/Rate Route Frequency Ordered Stop   09/02/13 2200  vancomycin (VANCOCIN) 1,500 mg in sodium chloride 0.9 % 500 mL IVPB     1,500 mg 250 mL/hr over 120 Minutes Intravenous Every 24 hours 09/02/13 1813     08/31/13 1800  vancomycin (VANCOCIN) 1,250 mg in sodium chloride 0.9 % 250 mL IVPB  Status:  Discontinued     1,250 mg 166.7 mL/hr over 90 Minutes Intravenous Every 12 hours 08/31/13 0551 09/02/13 1813   08/29/13 2200  voriconazole (VFEND) tablet 200 mg  Status:  Discontinued     200 mg Oral Every 12 hours 08/29/13 2042 08/30/13 1837   08/29/13 2000  vancomycin (VANCOCIN) IVPB 1000 mg/200 mL premix  Status:  Discontinued     1,000 mg 200 mL/hr over 60 Minutes Intravenous Every 8 hours 08/29/13 1851 08/31/13 0551   08/29/13 2000  imipenem-cilastatin (PRIMAXIN) 500 mg in sodium chloride 0.9 % 100 mL IVPB     500 mg 200 mL/hr over 30 Minutes Intravenous 4 times per day 08/29/13 1851        Medications:  Scheduled: . allopurinol  100 mg Oral Daily  . amLODipine  10 mg Oral Daily  . docusate sodium  100 mg Oral BID  . enoxaparin (LOVENOX) injection  40 mg Subcutaneous Q24H  . feeding supplement (ENSURE COMPLETE)  237 mL Oral BID BM  . furosemide  40 mg Oral Daily  . imipenem-cilastatin  500 mg Intravenous 4 times per day  . insulin aspart  0-15 Units Subcutaneous TID WC  . ipratropium-albuterol  3 mL Nebulization QID  . leflunomide  20 mg Oral Daily  . levothyroxine   50 mcg Oral QAC breakfast  . loratadine  10 mg Oral Daily  . methylPREDNISolone (SOLU-MEDROL) injection  50 mg Intravenous Daily  . mometasone-formoterol  2 puff Inhalation BID  . montelukast  10 mg Oral Daily  . pantoprazole  40 mg Oral Daily  . polyethylene glycol  17 g Oral Daily  . potassium chloride SA  60 mEq Oral Daily  . senna  1 tablet Oral BID  . sodium chloride  3 mL Intravenous Q12H  . vancomycin  1,500 mg Intravenous Q24H  . zolpidem  5 mg Oral Once    Objective: Vital signs in last 24 hours: Temp:  [98.2 F (36.8 C)-99.6 F (37.6 C)] 98.4 F (36.9 C) (03/23 1229) Pulse Rate:  [105-134] 125 (03/23 1229) Resp:  [21-33] 21 (03/23 1229) BP: (113-141)/(58-79) 113/71 mmHg (03/23 1229) SpO2:  [90 %-100 %] 94 % (03/23 1229) Weight:  [92.5 kg (203 lb 14.8 oz)] 92.5 kg (203 lb 14.8 oz) (03/23 0400)   General appearance: alert, cooperative and no distress Resp: rhonchi bilaterally and improved and tachypneia Cardio: tachycardic GI: normal findings: bowel sounds normal and soft, non-tender  Lab Results  Recent Labs  09/05/13 0525  WBC 5.4  HGB 8.1*  HCT 28.8*  NA 144  K 3.5*  CL 102  CO2 34*  BUN 19  CREATININE 0.57   Liver Panel No results found for this basename: PROT, ALBUMIN, AST, ALT, ALKPHOS, BILITOT, BILIDIR, IBILI,  in the last 72 hours Sedimentation Rate No results found for this basename: ESRSEDRATE,  in the last 72 hours C-Reactive Protein No results found for this basename: CRP,  in the last 72 hours  Microbiology: Recent Results (from the past 240 hour(s))  CULTURE, BLOOD (ROUTINE X 2)     Status: None   Collection Time    08/29/13  8:00 PM      Result Value Ref Range Status   Specimen Description BLOOD LEFT HAND   Final   Special Requests BOTTLES DRAWN AEROBIC ONLY 10CC   Final   Culture  Setup Time     Final   Value: 08/30/2013 03:46     Performed at [facility]   Culture     Final   Value: NO GROWTH 5 DAYS     Performed at  [facility]   Report Status 09/05/2013 FINAL   Final  CULTURE, BLOOD (ROUTINE X 2)     Status: None   Collection Time    08/29/13  8:15 PM      Result Value Ref Range Status   Specimen Description BLOOD LEFT ARM   Final   Special Requests BOTTLES DRAWN AEROBIC ONLY 10CC   Final   Culture  Setup Time     Final   Value: 08/30/2013 02:07     Performed at [facility]   Culture     Final   Value: NO GROWTH 5 DAYS     Performed at [facility]   Report Status 09/05/2013 FINAL   Final  MRSA PCR SCREENING     Status: Abnormal   Collection Time    08/29/13  8:49 PM      Result Value Ref Range Status   MRSA by PCR INVALID RESULTS, SPECIMEN SENT FOR CULTURE (*) NEGATIVE Final   Comment: RESULT CALLED TO, READ BACK BY AND VERIFIED WITH:     ASMITH RN AT 0120 ON [id number] BY DLONG                The GeneXpert MRSA Assay (FDA     approved for NASAL specimens     only), is one component of a     comprehensive MRSA colonization     surveillance program. It is not     intended to diagnose MRSA     infection nor to guide or     monitor treatment for     MRSA infections.  MRSA CULTURE     Status: None   Collection Time    08/29/13  8:49 PM      Result Value Ref Range Status   Specimen Description NOSE   Final   Special Requests NONE   Final   Culture     Final   Value: ABUNDANT STAPHYLOCOCCUS AUREUS     Note: NOMRSA     Performed at [facility]   Report Status 09/01/2013 FINAL   Final  RESPIRATORY VIRUS PANEL     Status: None   Collection Time    08/29/13  9:32 PM      Result Value Ref Range Status   Source - RVPAN NOSE   Final   Respiratory Syncytial Virus A NOT DETECTED   Final   Respiratory Syncytial Virus B NOT DETECTED   Final   Influenza A NOT DETECTED  Final   Influenza B NOT DETECTED   Final   Parainfluenza 1 NOT DETECTED   Final   Parainfluenza 2 NOT DETECTED   Final   Parainfluenza 3 NOT DETECTED   Final   Metapneumovirus NOT DETECTED    Final   Rhinovirus NOT DETECTED   Final   Adenovirus NOT DETECTED   Final   Influenza A H1 NOT DETECTED   Final   Influenza A H3 NOT DETECTED   Final   Comment: (NOTE)           Normal Reference Range for each Analyte: NOT DETECTED     Testing performed using the Luminex xTAG Respiratory Viral Panel test     kit.     This test was developed and its performance characteristics determined     by [facility]. It has not been cleared or approved by the [place]     Food and Drug Administration. This test is used for clinical purposes.     It should not be regarded as investigational or for research. This     laboratory is certified under the [place] (CLIA) as qualified to perform high complexity     clinical laboratory testing.     Performed at [facility], RESPIRATORY (NON-EXPECTORATED)     Status: None   Collection Time    08/31/13 12:32 PM      Result Value Ref Range Status   Specimen Description SPUTUM   Final   Special Requests NONE   Final   Gram Stain     Final   Value: NO WBC SEEN     NO SQUAMOUS EPITHELIAL CELLS SEEN     NO ORGANISMS SEEN     Performed at [facility]   Culture     Final   Value: NORMAL OROPHARYNGEAL FLORA     Performed at [facility]   Report Status 09/03/2013 FINAL   Final  CULTURE, EXPECTORATED SPUTUM-ASSESSMENT     Status: None   Collection Time    08/31/13 12:51 PM      Result Value Ref Range Status   Specimen Description SPUTUM   Final   Special Requests NONE   Final   Sputum evaluation     Final   Value: THIS SPECIMEN IS ACCEPTABLE. RESPIRATORY CULTURE REPORT TO FOLLOW.   Report Status 08/31/2013 FINAL   Final  FUNGUS CULTURE W SMEAR     Status: None   Collection Time    08/31/13 12:51 PM      Result Value Ref Range Status   Specimen Description SPUTUM   Final   Special Requests NONE   Final   Fungal Smear     Final   Value: NO YEAST OR FUNGAL ELEMENTS SEEN      Performed at [facility]   Culture     Final   Value: CULTURE IN PROGRESS FOR FOUR WEEKS     Performed at [facility]   Report Status PENDING   Incomplete  AFB CULTURE WITH SMEAR     Status: None   Collection Time    08/31/13 11:55 PM      Result Value Ref Range Status   Specimen Description SPUTUM   Final   Special Requests Normal   Final   ACID FAST SMEAR     Final   Value: NO ACID FAST BACILLI SEEN     Performed at  [facility]     Final   Value: CULTURE WILL BE EXAMINED FOR 6 WEEKS BEFORE ISSUING A FINAL REPORT     Performed at [facility]   Report Status PENDING   Incomplete    Studies/Results: Dg Chest Port 1 View  09/05/2013   CLINICAL DATA:  Evaluate infiltrates.  EXAM: PORTABLE CHEST - 1 VIEW  COMPARISON:  Chest x-ray 08/29/2013.  FINDINGS: As with numerous prior examinations there are extensive irregular masslike areas of architectural distortion throughout the lungs bilaterally, particularly in the left mid to upper lung and in the right apex. No new acute consolidative airspace disease. No pleural effusions. No evidence of pulmonary edema. Heart size is normal. Upper mediastinal contours are grossly distorted, but similar to prior studies. Right internal jugular central venous catheter with tip terminating in the superior aspect of the right atrium.  IMPRESSION: 1. Appearance of the chest is similar to numerous prior examinations demonstrating extensive chronic scarring, as above. No definite acute cardiopulmonary disease is identified at this time.   Electronically Signed   By: Vinnie Langton M.D.   On: 09/05/2013 05:37     Assessment/Plan: Cavitary lung lesion  COPD  BOOP  RA  Constipation  DM2   Total days of antibiotics: 7 vanco/imipenem   Await fungal serologies  Home hospice planning No change in anbx for now, aim for 10 days of anbx.  Available if questions          Bobby Rumpf Infectious  Diseases (pager) [phone] [email or web address] 09/05/2013, 1:48 PM  LOS: 7 days

## 2013-09-06 ENCOUNTER — Inpatient Hospital Stay (HOSPITAL_COMMUNITY): Payer: Medicare Other

## 2013-09-06 DIAGNOSIS — E43 Unspecified severe protein-calorie malnutrition: Secondary | ICD-10-CM

## 2013-09-06 LAB — GLUCOSE, CAPILLARY
GLUCOSE-CAPILLARY: 179 mg/dL — AB (ref 70–99)
GLUCOSE-CAPILLARY: 169 mg/dL — AB (ref 70–99)
Glucose-Capillary: 83 mg/dL (ref 70–99)
Glucose-Capillary: 138 mg/dL — ABNORMAL HIGH (ref 70–99)

## 2013-09-06 MED ORDER — ENSURE COMPLETE PO LIQD
237.0000 mL | Freq: Every day | ORAL | Status: DC
  Administered 2013-09-07: 237 mL via ORAL

## 2013-09-06 NOTE — Progress Notes (Signed)
ANTIBIOTIC CONSULT NOTE - FOLLOW UP  Pharmacy Consult for vancomycin Indication: pneumonia  Allergies  Allergen Reactions  . Chantix [Varenicline] Other (See Comments)    Unknown " blurry vision"  . Hctz [Hydrochlorothiazide] Other (See Comments)    unknown  . Penicillins Rash  . Sulfonamide Derivatives Rash  . Tetanus Toxoid Other (See Comments)    Knot on arm    Patient Measurements: Height: _0  (162.6 cm) Weight: 197 lb 14.4 oz (89.767 kg) IBW/kg (Calculated) : 54.7 Adjusted Body Weight:   Vital Signs: Temp: 98.4 F (36.9 C) (03/24 0000) Temp src: Oral (03/24 0000) BP: 157/83 mmHg (03/24 0500) Pulse Rate: 97 (03/24 0500) Intake/Output from previous day: 03/23 0701 - 03/24 0700 In: 1468.2 [P.O.:870; I.V.:98.2; IV Piggyback:500] Out: 3228 [Urine:3225; Stool:3] Intake/Output from this shift: Total I/O In: 520 [P.O.:220; IV Piggyback:300] Out: 550 [Urine:550]  Labs:  Recent Labs  09/05/13 0525  WBC 5.4  HGB 8.1*  PLT 112*  CREATININE 0.57   Estimated Creatinine Clearance: 79.1 ml/min (by C-G formula based on Cr of 0.57).  Recent Labs  09/05/13 2130  [place] 9.7*     Microbiology: Recent Results (from the past 720 hour(s))  CULTURE, BLOOD (ROUTINE X 2)     Status: None   Collection Time    08/10/13  8:35 PM      Result Value Ref Range Status   Specimen Description BLOOD LEFT WRIST   Final   Special Requests BOTTLES DRAWN AEROBIC AND ANAEROBIC 2.5ML   Final   Culture  Setup Time     Final   Value: 08/11/2013 01:18     Performed at [facility]   Culture     Final   Value: NO GROWTH 5 DAYS     Performed at [facility]   Report Status 08/17/2013 FINAL   Final  MRSA PCR SCREENING     Status: Abnormal   Collection Time    08/10/13 11:40 PM      Result Value Ref Range Status   MRSA by PCR POSITIVE (*) NEGATIVE Final   Comment:            The GeneXpert MRSA Assay (FDA     approved for NASAL specimens     only), is one  component of a     comprehensive MRSA colonization     surveillance program. It is not     intended to diagnose MRSA     infection nor to guide or     monitor treatment for     MRSA infections.     RESULT CALLED TO, READ BACK BY AND VERIFIED WITH:     SPOKE WITH AYERS,C RN [phone] [place],N  CULTURE, BLOOD (ROUTINE X 2)     Status: None   Collection Time    08/10/13 11:45 PM      Result Value Ref Range Status   Specimen Description BLOOD LEFT HAND   Final   Special Requests BOTTLES DRAWN AEROBIC AND ANAEROBIC 5CC   Final   Culture  Setup Time     Final   Value: 08/11/2013 04:06     Performed at [facility]   Culture     Final   Value: NO GROWTH 5 DAYS     Performed at [facility]   Report Status 08/17/2013 FINAL   Final  FUNGUS CULTURE W SMEAR     Status: None   Collection Time    08/15/13  6:00 PM  Result Value Ref Range Status   Specimen Description SPUTUM   Final   Special Requests Immunocompromised   Final   Fungal Smear     Final   Value: NO YEAST OR FUNGAL ELEMENTS SEEN     Performed at [facility]   Culture     Final   Value: CANDIDA ALBICANS     Performed at [facility]   Report Status PENDING   Incomplete  CULTURE, EXPECTORATED SPUTUM-ASSESSMENT     Status: None   Collection Time    08/15/13  6:00 PM      Result Value Ref Range Status   Specimen Description SPUTUM   Final   Special Requests NONE   Final   Sputum evaluation     Final   Value: THIS SPECIMEN IS ACCEPTABLE. RESPIRATORY CULTURE REPORT TO FOLLOW.   Report Status 08/15/2013 FINAL   Final  CULTURE, RESPIRATORY (NON-EXPECTORATED)     Status: None   Collection Time    08/15/13  6:00 PM      Result Value Ref Range Status   Specimen Description SPUTUM   Final   Special Requests NONE   Final   Gram Stain     Final   Value: FEW WBC PRESENT,BOTH PMN AND MONONUCLEAR     RARE SQUAMOUS EPITHELIAL CELLS PRESENT     FEW GRAM POSITIVE COCCI IN PAIRS     FEW GRAM  NEGATIVE RODS     Performed at [facility]   Culture     Final   Value: NORMAL OROPHARYNGEAL FLORA     Performed at [facility]   Report Status 08/18/2013 FINAL   Final  CULTURE, BLOOD (ROUTINE X 2)     Status: None   Collection Time    08/29/13  8:00 PM      Result Value Ref Range Status   Specimen Description BLOOD LEFT HAND   Final   Special Requests BOTTLES DRAWN AEROBIC ONLY 10CC   Final   Culture  Setup Time     Final   Value: 08/30/2013 03:46     Performed at [facility]   Culture     Final   Value: NO GROWTH 5 DAYS     Performed at [facility]   Report Status 09/05/2013 FINAL   Final  CULTURE, BLOOD (ROUTINE X 2)     Status: None   Collection Time    08/29/13  8:15 PM      Result Value Ref Range Status   Specimen Description BLOOD LEFT ARM   Final   Special Requests BOTTLES DRAWN AEROBIC ONLY 10CC   Final   Culture  Setup Time     Final   Value: 08/30/2013 02:07     Performed at [facility]   Culture     Final   Value: NO GROWTH 5 DAYS     Performed at [facility]   Report Status 09/05/2013 FINAL   Final  MRSA PCR SCREENING     Status: Abnormal   Collection Time    08/29/13  8:49 PM      Result Value Ref Range Status   MRSA by PCR INVALID RESULTS, SPECIMEN SENT FOR CULTURE (*) NEGATIVE Final   Comment: RESULT CALLED TO, READ BACK BY AND VERIFIED WITH:     ASMITH RN AT 0120 ON [id number] BY DLONG                The  GeneXpert MRSA Assay (FDA     approved for NASAL specimens     only), is one component of a     comprehensive MRSA colonization     surveillance program. It is not     intended to diagnose MRSA     infection nor to guide or     monitor treatment for     MRSA infections.  MRSA CULTURE     Status: None   Collection Time    08/29/13  8:49 PM      Result Value Ref Range Status   Specimen Description NOSE   Final   Special Requests NONE   Final   Culture     Final   Value: ABUNDANT STAPHYLOCOCCUS  AUREUS     Note: NOMRSA     Performed at [facility]   Report Status 09/01/2013 FINAL   Final  RESPIRATORY VIRUS PANEL     Status: None   Collection Time    08/29/13  9:32 PM      Result Value Ref Range Status   Source - RVPAN NOSE   Final   Respiratory Syncytial Virus A NOT DETECTED   Final   Respiratory Syncytial Virus B NOT DETECTED   Final   Influenza A NOT DETECTED   Final   Influenza B NOT DETECTED   Final   Parainfluenza 1 NOT DETECTED   Final   Parainfluenza 2 NOT DETECTED   Final   Parainfluenza 3 NOT DETECTED   Final   Metapneumovirus NOT DETECTED   Final   Rhinovirus NOT DETECTED   Final   Adenovirus NOT DETECTED   Final   Influenza A H1 NOT DETECTED   Final   Influenza A H3 NOT DETECTED   Final   Comment: (NOTE)           Normal Reference Range for each Analyte: NOT DETECTED     Testing performed using the Luminex xTAG Respiratory Viral Panel test     kit.     This test was developed and its performance characteristics determined     by [facility]. It has not been cleared or approved by the [place]     Food and Drug Administration. This test is used for clinical purposes.     It should not be regarded as investigational or for research. This     laboratory is certified under the [place] (CLIA) as qualified to perform high complexity     clinical laboratory testing.     Performed at [facility], RESPIRATORY (NON-EXPECTORATED)     Status: None   Collection Time    08/31/13 12:32 PM      Result Value Ref Range Status   Specimen Description SPUTUM   Final   Special Requests NONE   Final   Gram Stain     Final   Value: NO WBC SEEN     NO SQUAMOUS EPITHELIAL CELLS SEEN     NO ORGANISMS SEEN     Performed at [facility]   Culture     Final   Value: NORMAL OROPHARYNGEAL FLORA     Performed at [facility]   Report Status 09/03/2013 FINAL   Final  CULTURE, EXPECTORATED  SPUTUM-ASSESSMENT     Status: None   Collection Time    08/31/13 12:51 PM      Result Value Ref Range Status   Specimen  Description SPUTUM   Final   Special Requests NONE   Final   Sputum evaluation     Final   Value: THIS SPECIMEN IS ACCEPTABLE. RESPIRATORY CULTURE REPORT TO FOLLOW.   Report Status 08/31/2013 FINAL   Final  FUNGUS CULTURE W SMEAR     Status: None   Collection Time    08/31/13 12:51 PM      Result Value Ref Range Status   Specimen Description SPUTUM   Final   Special Requests NONE   Final   Fungal Smear     Final   Value: NO YEAST OR FUNGAL ELEMENTS SEEN     Performed at [facility]   Culture     Final   Value: CULTURE IN PROGRESS FOR FOUR WEEKS     Performed at [facility]   Report Status PENDING   Incomplete  AFB CULTURE WITH SMEAR     Status: None   Collection Time    08/31/13 11:55 PM      Result Value Ref Range Status   Specimen Description SPUTUM   Final   Special Requests Normal   Final   ACID FAST SMEAR     Final   Value: NO ACID FAST BACILLI SEEN     Performed at [facility]   Culture     Final   Value: CULTURE WILL BE EXAMINED FOR 6 WEEKS BEFORE ISSUING A FINAL REPORT     Performed at [facility]   Report Status PENDING   Incomplete    Anti-infectives   Start     Dose/Rate Route Frequency Ordered Stop   09/05/13 2245  vancomycin (VANCOCIN) IVPB 1000 mg/200 mL premix     1,000 mg 200 mL/hr over 60 Minutes Intravenous 2 times daily 09/05/13 2233     09/02/13 2200  vancomycin (VANCOCIN) 1,500 mg in sodium chloride 0.9 % 500 mL IVPB  Status:  Discontinued     1,500 mg 250 mL/hr over 120 Minutes Intravenous Every 24 hours 09/02/13 1813 09/05/13 2233   08/31/13 1800  vancomycin (VANCOCIN) 1,250 mg in sodium chloride 0.9 % 250 mL IVPB  Status:  Discontinued     1,250 mg 166.7 mL/hr over 90 Minutes Intravenous Every 12 hours 08/31/13 0551 09/02/13 1813   08/29/13 2200  voriconazole (VFEND) tablet 200 mg  Status:   Discontinued     200 mg Oral Every 12 hours 08/29/13 2042 08/30/13 1837   08/29/13 2000  vancomycin (VANCOCIN) IVPB 1000 mg/200 mL premix  Status:  Discontinued     1,000 mg 200 mL/hr over 60 Minutes Intravenous Every 8 hours 08/29/13 1851 08/31/13 0551   08/29/13 2000  imipenem-cilastatin (PRIMAXIN) 500 mg in sodium chloride 0.9 % 100 mL IVPB     500 mg 200 mL/hr over 30 Minutes Intravenous 4 times per day 08/29/13 1851        Assessment: Patient with low heparin level after dose decrease.  Doses charted correctly.  Goal of Therapy:  Vancomycin trough level 15-20 mcg/ml  Plan:  Measure antibiotic drug levels at steady state Follow up culture results Change vancomycin to 1gm iv q12hr  Nani Skillern Crowford 09/06/2013,5:24 AM

## 2013-09-06 NOTE — Progress Notes (Signed)
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 PNA. Noted overall prognosis from a pulmonary perspective is poor.   3/17 - Patient was last seen by an inpatient RD 08/11/12. Met with patient and daughter today. Patient reports a poor appetite since the beginning of February. Usually only eats 2 meals per day (a bagel for breakfast and vegetables later in the day). Has not been eating much meat or other protein rich foods. Drank 1 Ensure daily (It is too expensive.) Weight has decreased from 209 lbs last admit. Some of weight loss probably secondary to fluid loss. Current intake has been inadequate  3/24 - Noted pt had episode of throat closing and difficulty swallowing water 3/18. Palliative care MD suspects this sensation likely r/t anxiety. Met with pt who reports eating 100% of meals and Ensure and denies any further difficulty swallowing/throat closing.   Potassium low - getting oral replacement   Height: Ht Readings from Last 1 Encounters:  08/29/13 $RemoveB'5\' 4"'[id number]$  (1.626 m)    Weight Status:   Wt Readings from Last 1 Encounters:  09/06/13 197 lb 14.4 oz (89.767 kg)  Admit wt:        199 lb 14.4 oz (90.6 kg)   Re-estimated needs:  Kcal: 1400-1500  Protein: 90-100 gm  Fluid: 1.5L fluid restriction   Skin: +1 generalized edema, +2 RLE, LLE edema  Diet Order: Cardiac   Intake/Output Summary (Last 24 hours) at 09/06/13 1126 Last data filed at 09/06/13 1048  Gross per 24 hour  Intake   1817 ml  Output   3106 ml  Net  -1289 ml    Last BM: 3/23   Labs:   Recent Labs Lab 08/31/13 0345 09/01/13 0527 09/05/13 0525  NA 139 140 144  K 4.4 4.1 3.5*   CL 95* 96 102  CO2 35* 34* 34*  BUN $Re'15 20 19  'pgj$ CREATININE 0.73 0.69 0.57  CALCIUM 9.2 9.0 9.0  GLUCOSE 128* 124* 91    CBG (last 3)   Recent Labs  09/05/13 1657 09/05/13 2114 09/06/13 0748  GLUCAP 189* 167* 83    Scheduled Meds: . allopurinol  100 mg Oral Daily  . amLODipine  10 mg Oral Daily  . docusate sodium  100 mg Oral BID  . enoxaparin (LOVENOX) injection  40 mg Subcutaneous Q24H  . feeding supplement (ENSURE COMPLETE)  237 mL Oral BID BM  . furosemide  40 mg Oral Daily  . imipenem-cilastatin  500 mg Intravenous 4 times per day  . insulin aspart  0-15 Units Subcutaneous TID WC  . ipratropium-albuterol  3 mL Nebulization QID  . leflunomide  20 mg Oral Daily  . levothyroxine  50 mcg Oral QAC breakfast  . loratadine  10 mg Oral Daily  . methylPREDNISolone (SOLU-MEDROL) injection  50 mg Intravenous Daily  . mometasone-formoterol  2 puff Inhalation BID  . montelukast  10 mg Oral Daily  . pantoprazole  40 mg Oral Daily  . polyethylene glycol  17 g Oral Daily  . potassium chloride SA  60 mEq Oral Daily  . senna  1 tablet Oral BID  . sodium chloride  3 mL Intravenous  Q12H  . vancomycin  1,000 mg Intravenous BID  . zolpidem  5 mg Oral Once    Mikey College MS, RD, [place] Pager [phone] After Hours Pager

## 2013-09-06 NOTE — Progress Notes (Addendum)
[facility] PROGRESS NOTE  Jacqueline Washington LZJ:[id number] DOB: [birth year] DOA: 08/29/2013 PCP: Alesia Richards, MD Off Service Summary: 63yo female with a hx of severe COPD who presented with sob, cough with increased o2 requirements. The patient was recently discharged prior to this admit with suspected aspergillosis vs PNA. The patient was initially continued on broad spec abx for HCAP and for aspergillosis. Pulmonary was consulted with recs to focus on HCAP coverage alone. ID was involved. The patient ultimately ruled out for TB with recs for 10 days total of vanc and primaxin. During this hospital stay, the patient's symptoms gradually improved. Given her chronic severe lung disease, Palliative Care was consulted to assist in discharge planning.  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 -Critical Care recs for increasing steroids for ?BOOP. -Per ID, on Vanc and primaxin - plan for total 10 days -> stop date 3/26 -Was on Voriconazole for ?pulmunary aspergillosis. D/c'd on 3/17 as pt thought not to have aspergillosis -Appreciate pulmonary input. -PMT recs noted and appreciated for pain and sx mgt -Now off contact precautions  Right Shoulder Pain -Suspected diaphragmatic pain from constipation-->improved with large BM   HCAP as above - Cont current abx  Initially Suspected Pulmonary Aspergillosis as above. Voriconazole d/c'd per Pulm recs as pt not thought to have aspergillosis  HTN bp stable and controlled overnight. On PRN hydralazine alone  Code Status: DNR Family Communication: Pt in room Disposition Plan: Pending - Pending  Consultants:  Pulmonary  ID  PMT  Antibiotics:  Vanc 3/16>>>  imipenem  3/16>>>  voriconazole (antifungal) 3/16>>>3/17  Subjective: Eager to go home. States "God will take this all away. He will  heal me and make me all better." Pt reports feeling better today.  Objective: Filed Vitals:   09/06/13 0500 09/06/13 0600 09/06/13 0700 09/06/13 0800  BP: 157/83   146/90  Pulse: 97 98 100 118  Temp:      TempSrc:      Resp: $Remo'14 21 26 26  'FXMIP$ Height:      Weight: 89.767 kg (197 lb 14.4 oz)     SpO2: 97% 98% 98% 93%    Intake/Output Summary (Last 24 hours) at 09/06/13 0806 Last data filed at 09/06/13 0800  Gross per 24 hour  Intake 1568.17 ml  Output   3347 ml  Net -1778.83 ml   Filed Weights   09/04/13 0500 09/05/13 0400 09/06/13 0500  Weight: 91.1 kg (200 lb 13.4 oz) 92.5 kg (203 lb 14.8 oz) 89.767 kg (197 lb 14.4 oz)    Exam:   General:  AA Ox3  Cardiovascular: RRR  Respiratory: coarse B with trace end-expiratory wheezing  Abdomen: S/NT/ND/+BS  Extremities: trace bilateral edema   Neurologic:  Non-focal  Data Reviewed: Basic Metabolic Panel:  Recent Labs Lab 08/31/13 0345 09/01/13 0527 09/05/13 0525  NA 139 140 144  K 4.4 4.1 3.5*  CL 95* 96 102  CO2 35* 34* 34*  GLUCOSE 128* 124* 91  BUN $Re'15 20 19  'XkP$ CREATININE 0.73 0.69 0.57  CALCIUM 9.2 9.0 9.0   Liver Function Tests: No results found for this basename: AST, ALT, ALKPHOS, BILITOT, PROT, ALBUMIN,  in the last 168 hours No results found for this basename: LIPASE, AMYLASE,  in the last 168 hours No results found for this basename: AMMONIA,  in the last 168 hours CBC:  Recent Labs Lab 08/31/13 0345 09/01/13 0527 09/05/13 0525  WBC 12.5* 11.0* 5.4  HGB 8.8* 9.0* 8.1*  HCT 30.9* 31.3* 28.8*  MCV 80.9 81.1 82.8  PLT 172 165 112*   Cardiac Enzymes:  Recent Labs Lab 08/31/13 1055  TROPONINI <0.30   BNP (last 3 results)  Recent Labs  03/25/13 0601 04/14/13 2300 08/10/13 1554  PROBNP 1150.0* 301.6* 1099.0*   CBG:  Recent Labs Lab 09/04/13 2218 09/05/13 0808 09/05/13 1237 09/05/13 1657 09/05/13 2114  GLUCAP 102* 79 134* 189* 167*    Recent Results (from the past 240 hour(s))   CULTURE, BLOOD (ROUTINE X 2)     Status: None   Collection Time    08/29/13  8:00 PM      Result Value Ref Range Status   Specimen Description BLOOD LEFT HAND   Final   Special Requests BOTTLES DRAWN AEROBIC ONLY 10CC   Final   Culture  Setup Time     Final   Value: 08/30/2013 03:46     Performed at [facility]   Culture     Final   Value: NO GROWTH 5 DAYS     Performed at [facility]   Report Status 09/05/2013 FINAL   Final  CULTURE, BLOOD (ROUTINE X 2)     Status: None   Collection Time    08/29/13  8:15 PM      Result Value Ref Range Status   Specimen Description BLOOD LEFT ARM   Final   Special Requests BOTTLES DRAWN AEROBIC ONLY 10CC   Final   Culture  Setup Time     Final   Value: 08/30/2013 02:07     Performed at [facility]   Culture     Final   Value: NO GROWTH 5 DAYS     Performed at [facility]   Report Status 09/05/2013 FINAL   Final  MRSA PCR SCREENING     Status: Abnormal   Collection Time    08/29/13  8:49 PM      Result Value Ref Range Status   MRSA by PCR INVALID RESULTS, SPECIMEN SENT FOR CULTURE (*) NEGATIVE Final   Comment: RESULT CALLED TO, READ BACK BY AND VERIFIED WITH:     ASMITH RN AT 0120 ON [id number] BY DLONG                The GeneXpert MRSA Assay (FDA     approved for NASAL specimens     only), is one component of a     comprehensive MRSA colonization     surveillance program. It is not     intended to diagnose MRSA     infection nor to guide or     monitor treatment for     MRSA infections.  MRSA CULTURE     Status: None   Collection Time    08/29/13  8:49 PM      Result Value Ref Range Status   Specimen Description NOSE   Final   Special Requests NONE   Final   Culture     Final   Value: ABUNDANT STAPHYLOCOCCUS AUREUS     Note: NOMRSA     Performed at [facility]   Report Status 09/01/2013 FINAL   Final  RESPIRATORY VIRUS PANEL     Status: None   Collection Time    08/29/13  9:32 PM       Result Value Ref Range Status   Source - RVPAN NOSE   Final   Respiratory  Syncytial Virus A NOT DETECTED   Final   Respiratory Syncytial Virus B NOT DETECTED   Final   Influenza A NOT DETECTED   Final   Influenza B NOT DETECTED   Final   Parainfluenza 1 NOT DETECTED   Final   Parainfluenza 2 NOT DETECTED   Final   Parainfluenza 3 NOT DETECTED   Final   Metapneumovirus NOT DETECTED   Final   Rhinovirus NOT DETECTED   Final   Adenovirus NOT DETECTED   Final   Influenza A H1 NOT DETECTED   Final   Influenza A H3 NOT DETECTED   Final   Comment: (NOTE)           Normal Reference Range for each Analyte: NOT DETECTED     Testing performed using the Luminex xTAG Respiratory Viral Panel test     kit.     This test was developed and its performance characteristics determined     by [facility]. It has not been cleared or approved by the [place]     Food and Drug Administration. This test is used for clinical purposes.     It should not be regarded as investigational or for research. This     laboratory is certified under the [place] (CLIA) as qualified to perform high complexity     clinical laboratory testing.     Performed at [facility], RESPIRATORY (NON-EXPECTORATED)     Status: None   Collection Time    08/31/13 12:32 PM      Result Value Ref Range Status   Specimen Description SPUTUM   Final   Special Requests NONE   Final   Gram Stain     Final   Value: NO WBC SEEN     NO SQUAMOUS EPITHELIAL CELLS SEEN     NO ORGANISMS SEEN     Performed at [facility]   Culture     Final   Value: NORMAL OROPHARYNGEAL FLORA     Performed at [facility]   Report Status 09/03/2013 FINAL   Final  CULTURE, EXPECTORATED SPUTUM-ASSESSMENT     Status: None   Collection Time    08/31/13 12:51 PM      Result Value Ref Range Status   Specimen Description SPUTUM   Final   Special Requests NONE   Final   Sputum  evaluation     Final   Value: THIS SPECIMEN IS ACCEPTABLE. RESPIRATORY CULTURE REPORT TO FOLLOW.   Report Status 08/31/2013 FINAL   Final  FUNGUS CULTURE W SMEAR     Status: None   Collection Time    08/31/13 12:51 PM      Result Value Ref Range Status   Specimen Description SPUTUM   Final   Special Requests NONE   Final   Fungal Smear     Final   Value: NO YEAST OR FUNGAL ELEMENTS SEEN     Performed at [facility]   Culture     Final   Value: CULTURE IN PROGRESS FOR FOUR WEEKS     Performed at [facility]   Report Status PENDING   Incomplete  AFB CULTURE WITH SMEAR     Status: None   Collection Time    08/31/13 11:55 PM      Result Value Ref Range Status   Specimen Description SPUTUM   Final   Special Requests Normal  Final   ACID FAST SMEAR     Final   Value: NO ACID FAST BACILLI SEEN     Performed at [facility]   Culture     Final   Value: CULTURE WILL BE EXAMINED FOR 6 WEEKS BEFORE ISSUING A FINAL REPORT     Performed at [facility]   Report Status PENDING   Incomplete     Studies: Dg Chest Port 1 View  09/06/2013   CLINICAL DATA:  Respiratory failure.  EXAM: PORTABLE CHEST - 1 VIEW  COMPARISON:  09/05/2013 and 08/29/2013  FINDINGS: The tip of the central venous catheter is in the region of the right atrium and could be retracted 2-3 cm.  There is extensive scarring and emphysematous disease in both lungs, unchanged. Small left pleural effusion has diminished.  IMPRESSION: Slight decrease in small left pleural effusion. No other change. Central venous catheter tip is in the right atrium and could be retracted slightly.   Electronically Signed   By: Geanie Cooley M.D.   On: 09/06/2013 07:32   Dg Chest Port 1 View  09/05/2013   CLINICAL DATA:  Evaluate infiltrates.  EXAM: PORTABLE CHEST - 1 VIEW  COMPARISON:  Chest x-ray 08/29/2013.  FINDINGS: As with numerous prior examinations there are extensive irregular masslike areas of architectural  distortion throughout the lungs bilaterally, particularly in the left mid to upper lung and in the right apex. No new acute consolidative airspace disease. No pleural effusions. No evidence of pulmonary edema. Heart size is normal. Upper mediastinal contours are grossly distorted, but similar to prior studies. Right internal jugular central venous catheter with tip terminating in the superior aspect of the right atrium.  IMPRESSION: 1. Appearance of the chest is similar to numerous prior examinations demonstrating extensive chronic scarring, as above. No definite acute cardiopulmonary disease is identified at this time.   Electronically Signed   By: Trudie Reed M.D.   On: 09/05/2013 05:37    Scheduled Meds: . allopurinol  100 mg Oral Daily  . amLODipine  10 mg Oral Daily  . docusate sodium  100 mg Oral BID  . enoxaparin (LOVENOX) injection  40 mg Subcutaneous Q24H  . feeding supplement (ENSURE COMPLETE)  237 mL Oral BID BM  . furosemide  40 mg Oral Daily  . imipenem-cilastatin  500 mg Intravenous 4 times per day  . insulin aspart  0-15 Units Subcutaneous TID WC  . ipratropium-albuterol  3 mL Nebulization QID  . leflunomide  20 mg Oral Daily  . levothyroxine  50 mcg Oral QAC breakfast  . loratadine  10 mg Oral Daily  . methylPREDNISolone (SOLU-MEDROL) injection  50 mg Intravenous Daily  . mometasone-formoterol  2 puff Inhalation BID  . montelukast  10 mg Oral Daily  . pantoprazole  40 mg Oral Daily  . polyethylene glycol  17 g Oral Daily  . potassium chloride SA  60 mEq Oral Daily  . senna  1 tablet Oral BID  . sodium chloride  3 mL Intravenous Q12H  . vancomycin  1,000 mg Intravenous BID  . zolpidem  5 mg Oral Once   Continuous Infusions:   Active Problems:   Chronic diastolic heart failure, NYHA class 1   Hypothyroidism   Diabetes mellitus, type II   Protein-calorie malnutrition, severe   Physical deconditioning   GERD (gastroesophageal reflux disease)   Chronic respiratory  failure   Hyperlipidemia   Hypertension   HCAP (healthcare-associated pneumonia)   Pulmonary aspergillosis  Time  spent: 35 minutes. Greater than 50% of this time was spent in direct contact with the patient coordinating care.  Donne Hazel  [facility] Pager [phone]  If 7PM-7AM, please contact night-coverage at [email or web address], password [facility] 09/06/2013, 8:06 AM  LOS: 8 days

## 2013-09-06 NOTE — Progress Notes (Signed)
PULMONARY / CRITICAL CARE MEDICINE   Name: Jacqueline Washington MRN: [id number] DOB: [birth year]    ADMISSION DATE:  08/29/2013 CONSULTATION DATE:  3/17  REFERRING MD :  Isaac Bliss PRIMARY SERVICE: [facility]  CHIEF COMPLAINT:  Right shoulder and side pain  BRIEF PATIENT DESCRIPTION: 63 y/o female with a complex pulmonary history including COPD and rheumatoid arthritis related lung disease was re-admitted on 3/16 with right shoulder pain and increasing shortness of breath.  SIGNIFICANT EVENTS / STUDIES:  URINE STREP 2/26 - POSITIVE URINE STREP 3/15 - POSITIVE 3/16 - CT Angio chest > no pe, severe bilateral emphysema, left effusion unchanged, ? 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; several areas of frank consolidation RUL and LUL 3/17 - Met with Palliative Care, several anxiety attacks 3/23 NAD at rest. 3/24 continues to improve  LINES / TUBES: R IJ Tunneled Dual Lumen 3/17>>>  CULTURES: Per ID 3/17 sputum bacterial>>nl flora 3/17 sputum fungal >>no afb / fungal on prelim>>> 3/16 BCx2>>neg 3/16 RVP>>neg  ANTIBIOTICS: Per ID 3/16 vanc >> 3/16 imipenem>> VFend (pre-admission) >>3/17   SUBJECTIVE:  Up to chair today, eating  VITAL SIGNS: Temp:  [98.3 F (36.8 C)-98.7 F (37.1 C)] 98.7 F (37.1 C) (03/24 0800) Pulse Rate:  [97-134] 118 (03/24 0800) Resp:  [14-31] 26 (03/24 0800) BP: (113-165)/(62-90) 146/90 mmHg (03/24 0800) SpO2:  [89 %-98 %] 93 % (03/24 0800) Weight:  [89.767 kg (197 lb 14.4 oz)] 89.767 kg (197 lb 14.4 oz) (03/24 0500)  INTAKE / OUTPUT: Intake/Output     03/23 0701 - 03/24 0700 03/24 0701 - 03/25 0700   P.O. 870 537   I.V. (mL/kg) 98.2 (1.1) 10 (0.1)   IV Piggyback 600    Total Intake(mL/kg) 1568.2 (17.5) 547 (6.1)   Urine (mL/kg/hr) 3225 (1.5) 120 (0.7)   Stool 3 (0)    Total Output 3228 120   Net -1659.8 +427          PHYSICAL EXAMINATION: Gen: chronically ill appearing, communicates well, cushingoid apperance HEENT: No JVD/LAN PULM:  diminished in bases, tachypnea but just got into chair  CV: RRR, no mgr, no JVD AB: BS+, soft, mildly tender RUQ, no hsm Ext: warm, 1-2+ LE edema, no clubbing, no cyanosis Derm: no rash or skin breakdown Neuro: A&Ox4, CN II-XII intact, MAEW   LABS:  CBC  Recent Labs Lab 08/31/13 0345 09/01/13 0527 09/05/13 0525  WBC 12.5* 11.0* 5.4  HGB 8.8* 9.0* 8.1*  HCT 30.9* 31.3* 28.8*  PLT 172 165 112*   Coag's No results found for this basename: APTT, INR,  in the last 168 hours BMET  Recent Labs Lab 08/31/13 0345 09/01/13 0527 09/05/13 0525  NA 139 140 144  K 4.4 4.1 3.5*  CL 95* 96 102  CO2 35* 34* 34*  BUN _0 CREATININE 0.73 0.69 0.57  GLUCOSE 128* 124* 91   Electrolytes  Recent Labs Lab 08/31/13 0345 09/01/13 0527 09/05/13 0525  CALCIUM 9.2 9.0 9.0   ABG No results found for this basename: PHART, PCO2ART, PO2ART,  in the last 168 hours Liver Enzymes No results found for this basename: AST, ALT, ALKPHOS, BILITOT, ALBUMIN,  in the last 168 hours Cardiac Enzymes  Recent Labs Lab 08/31/13 1055  TROPONINI <0.30   Glucose  Recent Labs Lab 09/04/13 2218 09/05/13 0808 09/05/13 1237 09/05/13 1657 09/05/13 2114 09/06/13 0748  GLUCAP 102* 79 134* 189* 167* 83    Imaging  See above  ASSESSMENT / PLAN:  PULMONARY A:  COPD (2013 FEV1 1.23 L, 54% pred) with severe emphysema complicated by rheumatoid arthritis related lung disease.   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  CT images highly suspicious for necrotizing organizing PNA.  Bx riksly  Regardless the definitive diagnosis, her overall prognosis from a pulmonary perspective is poor.    Urine strep antigen positive, PROBABLY TRUE POSITIVE bur residual from feb 2015 .  Prelim AFB, Fungal culture neg.   P:   -continue current steroid dose for organizing pneumonia -agree with empiric HCAP coverage, defer to ID for choices here -continue  bronchodilators -wean O2 as indicated -Palliative care following for assistance with narcotics for relief of dyspnea, appreciate input -lasix as needed  GASTROINTESTINAL A:   R shoulder, epigastric pain> is this referred pain from constipation? RUQ ultrasound negative Constipation from narcotics P:   -continue bowel regimen -Palliative care to help with symptom management.  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 Joya Gaskins for EOL issues and dyspnea relief. Consider transfer to floor 3/24.  Richardson Landry Minor ACNP Maryanna Shape PCCM Pager [phone] till 3 pm If no answer page [phone] 09/06/2013, 8:48 AM   Staff note For now steroids but need to reassess. Contiue abx. Appreciate pall caer input. Move to floor. PCCM will follow along   Dr. Brand Males, M.D., [facility].C.P Pulmonary and Critical Care Medicine Staff Physician [facility] Pager: [phone], If no answer or between  15:00h - 7:00h: call [phone]  09/06/2013 10:06 AM

## 2013-09-07 LAB — GLUCOSE, CAPILLARY
GLUCOSE-CAPILLARY: 136 mg/dL — AB (ref 70–99)
Glucose-Capillary: 136 mg/dL — ABNORMAL HIGH (ref 70–99)
Glucose-Capillary: 195 mg/dL — ABNORMAL HIGH (ref 70–99)
Glucose-Capillary: 190 mg/dL — ABNORMAL HIGH (ref 70–99)
Glucose-Capillary: 116 mg/dL — ABNORMAL HIGH (ref 70–99)

## 2013-09-07 LAB — BASIC METABOLIC PANEL
BUN: 14 mg/dL (ref 6–23)
CALCIUM: 8.8 mg/dL (ref 8.4–10.5)
CO2: 32 meq/L (ref 19–32)
CREATININE: 0.55 mg/dL (ref 0.50–1.10)
Chloride: 99 mEq/L (ref 96–112)
GFR calc Af Amer: >90 mL/min (ref >90)
GFR calc non Af Amer: >90 mL/min (ref >90)
Glucose, Bld: 93 mg/dL (ref 70–99)
Potassium: 3.3 mEq/L — ABNORMAL LOW (ref 3.7–5.3)
Sodium: 142 mEq/L (ref 137–147)

## 2013-09-07 LAB — CBC
HCT: 29.7 % — ABNORMAL LOW (ref 36.0–46.0)
Hemoglobin: 8.5 g/dL — ABNORMAL LOW (ref 12.0–15.0)
MCH: 23.5 pg — AB (ref 26.0–34.0)
MCHC: 28.6 g/dL — ABNORMAL LOW (ref 30.0–36.0)
MCV: 82 fL (ref 78.0–100.0)
PLATELETS: 121 10*3/uL — AB (ref 150–400)
RBC: 3.62 MIL/uL — ABNORMAL LOW (ref 3.87–5.11)
RDW: 22.9 % — ABNORMAL HIGH (ref 11.5–15.5)
WBC: 6.4 10*3/uL (ref 4.0–10.5)

## 2013-09-07 MED ORDER — PREDNISONE 50 MG PO TABS
60.0000 mg | ORAL_TABLET | Freq: Every day | ORAL | Status: DC
  Administered 2013-09-07: 60 mg via ORAL
  Filled 2013-09-07 (×2): qty 1

## 2013-09-07 MED ORDER — PREDNISONE 20 MG PO TABS
40.0000 mg | ORAL_TABLET | Freq: Every day | ORAL | Status: DC
  Administered 2013-09-08: 40 mg via ORAL
  Filled 2013-09-07 (×2): qty 2

## 2013-09-07 MED ORDER — POTASSIUM CHLORIDE CRYS ER 20 MEQ PO TBCR
40.0000 meq | EXTENDED_RELEASE_TABLET | Freq: Two times a day (BID) | ORAL | Status: DC
  Administered 2013-09-07 – 2013-09-08 (×3): 40 meq via ORAL
  Filled 2013-09-07 (×3): qty 2

## 2013-09-07 MED ORDER — PREDNISONE 50 MG PO TABS: 60.0000 mg | ORAL_TABLET | Freq: Every day | ORAL | Status: DC

## 2013-09-07 MED ORDER — PREDNISONE 20 MG PO TABS
40.0000 mg | ORAL_TABLET | Freq: Every day | ORAL | Status: DC
  Filled 2013-09-07 (×2): qty 2

## 2013-09-07 NOTE — Progress Notes (Signed)
Patient transferred to floor from ICU.  Telemetry placed on patient and confirmed with CMT.  Agree with previous RN's assessment of patient, except that swelling in R leg is +3 pitting and worse than L leg nonpitting edema.  Patient's upper extremities also appear edematous, nonpitting.  Patient resting well, vitals stable with family at bedside.  Will continue to monitor.

## 2013-09-07 NOTE — Progress Notes (Addendum)
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  - Appreciate Critical Care assistance, change solumedrol to prednisone - continue bronchodilators -Per ID, on Vanc and primaxin - plan for total 10 days -> stop date 3/26  -Was on Voriconazole for ?pulmunary aspergillosis. D/c'd on 3/17 as pt thought not to have aspergillosis  -PMT recs noted and appreciated for pain and sx mgt  -Now off contact precautions  -Continue lasix 42m daily, weight decreasing gradually -  Sputum cx normal flora  Right Shoulder Pain  -Suspected diaphragmatic pain from constipation-->improved with large BM   HCAP as above - Cont current abx   Initially Suspected Pulmonary Aspergillosis as above.  -  Voriconazole d/c'd per Pulm recs as pt not thought to have aspergillosis   HTN bp stable and controlled overnight. O -  Continue norvasc  Sinus tachycardia, likely due to bronchodilators -  D/c prn hydralazine and consider starting norvasc if BP remains elevated  Normocytic anemia likely due to chronic disease  Thrombocytopenia, acute phase reactant  Hypokalemia due to lasix use -  Increase to KCl PO 438m BID  Diet:  Healthy heart, 1.5L Access:  Central line >> PIV  IVF:  OFF Proph:  lovenox  Code Status: DNR Family Communication: patient alone Disposition Plan: likely home tomorrow afternoon or Friday AM after completion of IV antibiotics  Consultants:  Pulmonary  ID  PMT Antibiotics:  Vanc 3/16>>>  imipenem 3/16>>>  voriconazole (antifungal) 3/16>>>3/17  HPI/Subjective:  Feels much better (100%).  Cough productive of sputum.  Improved SOB and denies wheeze.    Objective: Filed Vitals:   09/06/13 2300 09/07/13 0000 09/07/13 0429 09/07/13 0500   BP: 135/71  146/81   Pulse: 109     Temp:  99.4 F (37.4 C)    TempSrc:  Oral    Resp: 20     Height:      Weight:    88.86 kg (195 lb 14.4 oz)  SpO2: 84%       Intake/Output Summary (Last 24 hours) at 09/07/13 0803 Last data filed at 09/07/13 0400  Gross per 24 hour  Intake   1467 ml  Output   2005 ml  Net   -538 ml   Filed Weights   09/05/13 0400 09/06/13 0500 09/07/13 0500  Weight: 92.5 kg (203 lb 14.8 oz) 89.767 kg (197 lb 14.4 oz) 88.86 kg (195 lb 14.4 oz)    Exam:   General:  CF, No acute distress at rest, but becomes SOB and breathy when talking  HEENT:  NCAT, MMM  Cardiovascular:  Tachycardic RRR with occasional PAC/PVC?, nl S1, S2 no mrg, 2+ pulses, warm extremities  Respiratory:  Diminished bilateral BS, no focal rales, + rhonchi, mild tachypnea when talking  Abdomen:   NABS, soft, NT/ND  MSK:   Normal tone and bulk, 1+ bilateral LEE  Neuro:  Grossly intact  Data Reviewed: Basic Metabolic Panel:  Recent Labs Lab 09/01/13 0527 09/05/13 0525 09/07/13 0520  NA 140 144 142  K 4.1 3.5* 3.3*  CL 96 102 99  CO2 34* 34* 32  GLUCOSE 124* 91 93  BUN _0 CREATININE 0.69 0.57 0.55  CALCIUM 9.0 9.0 8.8   Liver Function Tests: No results found for this basename: AST, ALT, ALKPHOS, BILITOT, PROT, ALBUMIN,  in  the last 168 hours No results found for this basename: LIPASE, AMYLASE,  in the last 168 hours No results found for this basename: AMMONIA,  in the last 168 hours CBC:  Recent Labs Lab 09/01/13 0527 09/05/13 0525 09/07/13 0520  WBC 11.0* 5.4 6.4  HGB 9.0* 8.1* 8.5*  HCT 31.3* 28.8* 29.7*  MCV 81.1 82.8 82.0  PLT 165 112* 121*   Cardiac Enzymes:  Recent Labs Lab 08/31/13 1055  TROPONINI <0.30   BNP (last 3 results)  Recent Labs  03/25/13 0601 04/14/13 2300 08/10/13 1554  PROBNP 1150.0* 301.6* 1099.0*   CBG:  Recent Labs Lab 09/05/13 2114 09/06/13 0748 09/06/13 1149 09/06/13 1614 09/06/13 2124  GLUCAP 167* 83  179* 169* 138*    Recent Results (from the past 240 hour(s))  CULTURE, BLOOD (ROUTINE X 2)     Status: None   Collection Time    08/29/13  8:00 PM      Result Value Ref Range Status   Specimen Description BLOOD LEFT HAND   Final   Special Requests BOTTLES DRAWN AEROBIC ONLY 10CC   Final   Culture  Setup Time     Final   Value: 08/30/2013 03:46     Performed at [facility]   Culture     Final   Value: NO GROWTH 5 DAYS     Performed at [facility]   Report Status 09/05/2013 FINAL   Final  CULTURE, BLOOD (ROUTINE X 2)     Status: None   Collection Time    08/29/13  8:15 PM      Result Value Ref Range Status   Specimen Description BLOOD LEFT ARM   Final   Special Requests BOTTLES DRAWN AEROBIC ONLY 10CC   Final   Culture  Setup Time     Final   Value: 08/30/2013 02:07     Performed at [facility]   Culture     Final   Value: NO GROWTH 5 DAYS     Performed at [facility]   Report Status 09/05/2013 FINAL   Final  MRSA PCR SCREENING     Status: Abnormal   Collection Time    08/29/13  8:49 PM      Result Value Ref Range Status   MRSA by PCR INVALID RESULTS, SPECIMEN SENT FOR CULTURE (*) NEGATIVE Final   Comment: RESULT CALLED TO, READ BACK BY AND VERIFIED WITH:     ASMITH RN AT 0120 ON [id number] BY DLONG                The GeneXpert MRSA Assay (FDA     approved for NASAL specimens     only), is one component of a     comprehensive MRSA colonization     surveillance program. It is not     intended to diagnose MRSA     infection nor to guide or     monitor treatment for     MRSA infections.  MRSA CULTURE     Status: None   Collection Time    08/29/13  8:49 PM      Result Value Ref Range Status   Specimen Description NOSE   Final   Special Requests NONE   Final   Culture     Final   Value: ABUNDANT STAPHYLOCOCCUS AUREUS     Note: NOMRSA     Performed at [facility]   Report Status 09/01/2013 FINAL   Final  RESPIRATORY VIRUS  PANEL     Status: None   Collection Time    08/29/13  9:32 PM      Result Value Ref Range Status   Source - RVPAN NOSE   Final   Respiratory Syncytial Virus A NOT DETECTED   Final   Respiratory Syncytial Virus B NOT DETECTED   Final   Influenza A NOT DETECTED   Final   Influenza B NOT DETECTED   Final   Parainfluenza 1 NOT DETECTED   Final   Parainfluenza 2 NOT DETECTED   Final   Parainfluenza 3 NOT DETECTED   Final   Metapneumovirus NOT DETECTED   Final   Rhinovirus NOT DETECTED   Final   Adenovirus NOT DETECTED   Final   Influenza A H1 NOT DETECTED   Final   Influenza A H3 NOT DETECTED   Final   Comment: (NOTE)           Normal Reference Range for each Analyte: NOT DETECTED     Testing performed using the Luminex xTAG Respiratory Viral Panel test     kit.     This test was developed and its performance characteristics determined     by [facility]. It has not been cleared or approved by the [place]     Food and Drug Administration. This test is used for clinical purposes.     It should not be regarded as investigational or for research. This     laboratory is certified under the Clinical Laboratory Improvement     Amendments of 1988 (CLIA) as qualified to perform high complexity     clinical laboratory testing.     Performed at [facility]  CULTURE, RESPIRATORY (NON-EXPECTORATED)     Status: None   Collection Time    08/31/13 12:32 PM      Result Value Ref Range Status   Specimen Description SPUTUM   Final   Special Requests NONE   Final   Gram Stain     Final   Value: NO WBC SEEN     NO SQUAMOUS EPITHELIAL CELLS SEEN     NO ORGANISMS SEEN     Performed at [facility]   Culture     Final   Value: NORMAL OROPHARYNGEAL FLORA     Performed at [facility]   Report Status 09/03/2013 FINAL   Final  CULTURE, EXPECTORATED SPUTUM-ASSESSMENT     Status: None   Collection Time    08/31/13 12:51 PM      Result Value Ref Range Status   Specimen  Description SPUTUM   Final   Special Requests NONE   Final   Sputum evaluation     Final   Value: THIS SPECIMEN IS ACCEPTABLE. RESPIRATORY CULTURE REPORT TO FOLLOW.   Report Status 08/31/2013 FINAL   Final  FUNGUS CULTURE W SMEAR     Status: None   Collection Time    08/31/13 12:51 PM      Result Value Ref Range Status   Specimen Description SPUTUM   Final   Special Requests NONE   Final   Fungal Smear     Final   Value: NO YEAST OR FUNGAL ELEMENTS SEEN     Performed at [facility]   Culture     Final   Value: CULTURE IN PROGRESS FOR FOUR WEEKS     Performed at [facility]   Report Status PENDING   Incomplete  AFB  CULTURE WITH SMEAR     Status: None   Collection Time    08/31/13 11:55 PM      Result Value Ref Range Status   Specimen Description SPUTUM   Final   Special Requests Normal   Final   ACID FAST SMEAR     Final   Value: NO ACID FAST BACILLI SEEN     Performed at [facility]   Culture     Final   Value: CULTURE WILL BE EXAMINED FOR 6 WEEKS BEFORE ISSUING A FINAL REPORT     Performed at [facility]   Report Status PENDING   Incomplete     Studies: Dg Chest Port 1 View  09/06/2013   CLINICAL DATA:  Respiratory failure.  EXAM: PORTABLE CHEST - 1 VIEW  COMPARISON:  09/05/2013 and 08/29/2013  FINDINGS: The tip of the central venous catheter is in the region of the right atrium and could be retracted 2-3 cm.  There is extensive scarring and emphysematous disease in both lungs, unchanged. Small left pleural effusion has diminished.  IMPRESSION: Slight decrease in small left pleural effusion. No other change. Central venous catheter tip is in the right atrium and could be retracted slightly.   Electronically Signed   By: Rozetta Nunnery M.D.   On: 09/06/2013 07:32    Scheduled Meds: . allopurinol  100 mg Oral Daily  . amLODipine  10 mg Oral Daily  . docusate sodium  100 mg Oral BID  . enoxaparin (LOVENOX) injection  40 mg Subcutaneous Q24H   . feeding supplement (ENSURE COMPLETE)  237 mL Oral Daily  . furosemide  40 mg Oral Daily  . imipenem-cilastatin  500 mg Intravenous 4 times per day  . insulin aspart  0-15 Units Subcutaneous TID WC  . ipratropium-albuterol  3 mL Nebulization QID  . leflunomide  20 mg Oral Daily  . levothyroxine  50 mcg Oral QAC breakfast  . loratadine  10 mg Oral Daily  . methylPREDNISolone (SOLU-MEDROL) injection  50 mg Intravenous Daily  . mometasone-formoterol  2 puff Inhalation BID  . montelukast  10 mg Oral Daily  . pantoprazole  40 mg Oral Daily  . polyethylene glycol  17 g Oral Daily  . potassium chloride SA  40 mEq Oral BID  . senna  1 tablet Oral BID  . sodium chloride  3 mL Intravenous Q12H  . vancomycin  1,000 mg Intravenous BID  . zolpidem  5 mg Oral Once   Continuous Infusions:   Active Problems:   Chronic diastolic heart failure, NYHA class 1   Hypothyroidism   Diabetes mellitus, type II   Protein-calorie malnutrition, severe   Physical deconditioning   GERD (gastroesophageal reflux disease)   Chronic respiratory failure   Hyperlipidemia   Hypertension   HCAP (healthcare-associated pneumonia)   Pulmonary aspergillosis    Time spent: 30 min    Angellina Ferdinand, [facility] Pager [phone]. If 7PM-7AM, please contact night-coverage at [email or web address], password [facility] 09/07/2013, 8:03 AM  LOS: 9 days

## 2013-09-07 NOTE — Progress Notes (Signed)
PULMONARY / CRITICAL CARE MEDICINE   Name: Jacqueline Washington MRN: [id number] DOB: 1951/04/22    ADMISSION DATE:  08/29/2013 CONSULTATION DATE:  3/17  REFERRING MD :  Isaac Bliss PRIMARY SERVICE: [facility]  CHIEF COMPLAINT:  Right shoulder and side pain  BRIEF PATIENT DESCRIPTION: 63 y/o female with a complex pulmonary history including COPD and rheumatoid arthritis related lung disease was re-admitted on 3/16 with right shoulder pain and increasing shortness of breath.  SIGNIFICANT EVENTS / STUDIES:  URINE STREP 2/26 - POSITIVE URINE STREP 3/15 - POSITIVE 3/16 - CT Angio chest > no pe, severe bilateral emphysema, left effusion unchanged, ? 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; several areas of frank consolidation RUL and LUL 3/17 - Met with Palliative Care, several anxiety attacks 3/23 NAD at rest. 3/24 continues to improve 3/25 stronger  LINES / TUBES: R IJ Tunneled Dual Lumen 3/17>>>  CULTURES: Per ID 3/17 sputum bacterial>>nl flora 3/17 sputum fungal >>no afb / fungal on prelim>>> 3/16 BCx2>>neg 3/16 RVP>>neg  ANTIBIOTICS: Per ID 3/16 vanc >> 3/16 imipenem>> VFend (pre-admission) >>3/17   SUBJECTIVE:  Looks stronger 3/25, less SOB Up in chair  VITAL SIGNS: Temp:  [98.5 F (36.9 C)-99.4 F (37.4 C)] 99.4 F (37.4 C) (03/25 0000) Pulse Rate:  [109-131] 109 (03/24 2300) Resp:  [20-36] 20 (03/24 2300) BP: (126-152)/(54-89) 146/81 mmHg (03/25 0429) SpO2:  [84 %-98 %] 84 % (03/24 2300) Weight:  [88.86 kg (195 lb 14.4 oz)] 88.86 kg (195 lb 14.4 oz) (03/25 0500)  INTAKE / OUTPUT: Intake/Output     03/24 0701 - 03/25 0700 03/25 0701 - 03/26 0700   P.O. 1057    I.V. (mL/kg) 10 (0.1)    IV Piggyback 700    Total Intake(mL/kg) 1767 (19.9)    Urine (mL/kg/hr) 2125 (1)    Stool     Total Output 2125     Net -358          Stool Occurrence 2 x      PHYSICAL EXAMINATION: Gen: chronically ill appearing, communicates well, cushingoid apperance HEENT: No  JVD/LAN PULM: diminished in bases still somewhat breathless  CV: RRR, no mgr, no JVD AB: BS+, soft, mildly tender RUQ, no hsm Ext: warm, 1-2+ LE edema, no clubbing, no cyanosis. Developing foot drop Derm: no rash or skin breakdown Neuro: A&Ox4, CN II-XII intact, MAEW   LABS:  CBC  Recent Labs Lab 09/01/13 0527 09/05/13 0525 09/07/13 0520  WBC 11.0* 5.4 6.4  HGB 9.0* 8.1* 8.5*  HCT 31.3* 28.8* 29.7*  PLT 165 112* 121*   Coag's No results found for this basename: APTT, INR,  in the last 168 hours BMET  Recent Labs Lab 09/01/13 0527 09/05/13 0525 09/07/13 0520  NA 140 144 142  K 4.1 3.5* 3.3*  CL 96 102 99  CO2 34* 34* 32  BUN _0 CREATININE 0.69 0.57 0.55  GLUCOSE 124* 91 93   Electrolytes  Recent Labs Lab 09/01/13 0527 09/05/13 0525 09/07/13 0520  CALCIUM 9.0 9.0 8.8   ABG No results found for this basename: PHART, PCO2ART, PO2ART,  in the last 168 hours Liver Enzymes No results found for this basename: AST, ALT, ALKPHOS, BILITOT, ALBUMIN,  in the last 168 hours Cardiac Enzymes  Recent Labs Lab 08/31/13 1055  TROPONINI <0.30   Glucose  Recent Labs Lab 09/05/13 1657 09/05/13 2114 09/06/13 0748 09/06/13 1149 09/06/13 1614 09/06/13 2124  GLUCAP 189* 167* 83 179* 169* 138*    Imaging  See above  ASSESSMENT / PLAN:  PULMONARY A: COPD (2013 FEV1 1.23 L, 54% pred) with severe emphysema complicated by rheumatoid arthritis related lung disease.   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  CT images highly suspicious for necrotizing organizing PNA.  Bx riksly  Regardless the definitive diagnosis, her overall prognosis from a pulmonary perspective is poor.    Urine strep antigen positive, PROBABLY TRUE POSITIVE bur residual from feb 2015 .  Prelim AFB, Fungal culture neg.   P:   -continue current steroid dose for organizing pneumonia -agree with empiric HCAP coverage, defer to ID for  choices here -continue bronchodilators -wean O2 as indicated -Palliative care following for assistance with narcotics for relief of dyspnea, appreciate input -lasix as needed  GASTROINTESTINAL A:   R shoulder, epigastric pain> is this referred pain from constipation? RUQ ultrasound negative Constipation from narcotics P:   -continue bowel regimen -Palliative care to help with symptom management.  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 Joya Gaskins for EOL issues and dyspnea relief. Consider transfer to floor 3/24. 3/25 PCCM to follow as a consult. Replete K+  Richardson Landry Minor ACNP Maryanna Shape PCCM Pager [phone] till 3 pm If no answer page [phone] 09/07/2013, 8:09 AM   Baltazar Apo, MD, PhD 09/07/2013, 11:12 AM [facility] [phone] or if no answer [phone]

## 2013-09-07 NOTE — Progress Notes (Signed)
Follow up with pt for continued assessment and support.   Jacqueline Washington continues to understand her illness through the lens of her religious narrative.  Today describes that "God has healed my lungs."  Reported that MDs "still think I'm sick, but I know I'm healed."   Describes weight loss as secondary to praying to God to change her body composition - stating God has told her she can weigh 125 pounds (weight when she was saved) again.   Jacqueline Washington sees the world in binary categories (good-evil / god-satan).  She can see those who are not on board with her view of her illness and God's cure as instruments of satan or not saved.   Jacqueline Washington is energized by her renewed closeness with God - describing some guilt around her past, feeling as though she could not be forgiven, and feeling distance from God since 1986.  This renewed closeness and energy could potentially be helpful in her care.  Chaplain will continue to work with Jacqueline Washington to explore areas of faith tradition that help her become more aware of illness / engaged in care.   Belva Crome MDiv

## 2013-09-08 DIAGNOSIS — I5033 Acute on chronic diastolic (congestive) heart failure: Secondary | ICD-10-CM

## 2013-09-08 DIAGNOSIS — I509 Heart failure, unspecified: Secondary | ICD-10-CM

## 2013-09-08 LAB — GLUCOSE, CAPILLARY
Glucose-Capillary: 88 mg/dL (ref 70–99)
Glucose-Capillary: 189 mg/dL — ABNORMAL HIGH (ref 70–99)

## 2013-09-08 LAB — MISCELLANEOUS TEST: Miscellaneous Test: 69005

## 2013-09-08 MED ORDER — SODIUM CHLORIDE 0.9 % IJ SOLN
10.0000 mL | INTRAMUSCULAR | Status: DC | PRN
  Administered 2013-09-08: 10 mL

## 2013-09-08 MED ORDER — PREDNISONE 20 MG PO TABS: 30.0000 mg | ORAL_TABLET | Freq: Every day | ORAL | Status: DC

## 2013-09-08 MED ORDER — HEPARIN SOD (PORK) LOCK FLUSH 100 UNIT/ML IV SOLN: 250.0000 [IU] | INTRAVENOUS | Status: DC | PRN

## 2013-09-08 MED ORDER — ALPRAZOLAM 0.5 MG PO TABS: 0.5000 mg | ORAL_TABLET | Freq: Three times a day (TID) | ORAL | Status: DC | PRN

## 2013-09-08 NOTE — Progress Notes (Signed)
Spoke with pt concerning home with hospice, she selected [facility], referral faxed to [phone] and called [phone].

## 2013-09-08 NOTE — Discharge Summary (Addendum)
Physician Discharge Summary  Jacqueline Washington LNL:[id number] DOB: 07-Jan-1951 DOA: 08/29/2013  PCP: Alesia Richards, MD  Admit date: 08/29/2013 Discharge date: 09/08/2013  Recommendations for Outpatient Follow-up:  1. Home with home hospice 2. Continue prednisone 30mg  once daily until follow up with pulmonology 3. Continue increased lasix dose until follow up with primary care doctor.  Repeat BMP and CBC in 1 week to check anemia, thrombocytopenia, potassium, and creatinine.  Dr. Steffanie Dunn office to call patient to schedule time and date.  Discharge Diagnoses:  Principal Problem:   HCAP (healthcare-associated pneumonia) Active Problems:   Acute on chronic diastolic congestive heart failure, NYHA class 1   Hypothyroidism   Diabetes mellitus, type II   Protein-calorie malnutrition, severe   Physical deconditioning   GERD (gastroesophageal reflux disease)   Acute and chronic respiratory failure   Hyperlipidemia   Hypertension   Pulmonary aspergillosis   Discharge Condition: stable, improved  Diet recommendation:   Healthy heart 1.5L  Wt Readings from Last 3 Encounters:  09/08/13 88.9 kg (195 lb 15.8 oz)  08/25/13 92.08 kg (203 lb)  08/14/13 96 kg (211 lb 10.3 oz)    History of present illness:  Jacqueline Washington is a 63 y.o. female  With history of COPD, recent hospitalization and discharged on 08/16/2013 for suspected aspergillosis versus pneumonia, rheumatoid arthritis that presented to the emergency department with complaints of shortness of breath and cough. Patient also complained of some right-sided rib pain which was worse with inspiration as well as coughing. Patient normally uses approximately 4-6 L of oxygen via nasal cannula at home, however today she's required approximately 8 L. Patient had gone to her primary care physician's office and was sent to the emergency department for further evaluation. Patient denies any recent fever, chills, sick contacts,  diarrhea.  Hospital Course:   Acute on chronic respiratory Failure, likely secondary to combination of severe emphysema, RA related lung disease, COP, possible HCAP.  She did not require bipap or intubation.  She was started on broad-spectrum antibiotics including voriconazole for possible pulmonary aspergillosis.  Urine strap antigen was positive, as it was earlier in February, this was likely a lingering positive PCR and not DVT acute strep infection. She completed a total of 10 days of vancomycin and Primaxin, last day on March 26.  Her voriconazole was subsequently discontinued on March 17 as she was thought not to have aspergillosis. Her sputum culture grew normal flora. She was started on IV Solu-Medrol and her dose with taper to 30 mg of prednisone once daily, which she should continue until she follows up with pulmonology in approximately 2 weeks.  For possible acute on chronic diastolic heart failure with obvious peripheral edema, she was started on Lasix IV once daily. Pulmonology and critical care assisted with her management as did infectious disease. She was seen by palliative care medicine to assist with her anxiety and shortness of breath, and she will be set up for home hospice services upon discharge.  She will continue prednisone, bronchodilators, Lasix at home for shortness of breath. For symptomatic management, and she may use low-dose narcotic.  Right Shoulder Pain Suspected diaphragmatic pain from constipation-->improved with large BM.  HCAP as above  Initially Suspected Pulmonary Aspergillosis as above.   HTN bp stable and controlled overnight.  Sinus tachycardia, likely due to bronchodilators.    Normocytic anemia likely due to chronic disease   Thrombocytopenia, acute phase reactant   Hypokalemia due to lasix use.   Increase to KCl PO 1meq  BID   Rheumatoid arthritis.  Continue DMARD.  Prednisone dose as above.    Consultants:  Pulmonary  ID  PMT Antibiotics:   Vanc 3/16>>> 3/26 imipenem 3/16>>> 3/26 voriconazole (antifungal) 3/16>>>3/17   Discharge Exam: Filed Vitals:   09/08/13 1428  BP: 103/69  Pulse: 126  Temp: 98.9 F (37.2 C)  Resp: 20   Filed Vitals:   09/08/13 0625 09/08/13 0823 09/08/13 1207 09/08/13 1428  BP: 135/81   103/69  Pulse: 117   126  Temp: 98.5 F (36.9 C)   98.9 F (37.2 C)  TempSrc: Oral   Oral  Resp: 20   20  Height:      Weight: 88.9 kg (195 lb 15.8 oz)     SpO2: 94% 99% 96% 98%   General: CF, No acute distress at rest, but becomes SOB and breathy when talking  HEENT: NCAT, MMM  Cardiovascular: Tachycardic RRR with occasional PAC/PVC?, nl S1, S2 no mrg, 2+ pulses, warm extremities  Respiratory: Diminished bilateral BS, no focal rales, + rhonchi, mild tachypnea when talking  Abdomen: NABS, soft, NT/ND  MSK: Normal tone and bulk, 1+ bilateral LEE  Neuro: Grossly intact   Discharge Instructions      Discharge Orders   Future Appointments Provider Department Dept Phone   09/14/2013 9:45 AM Unk Pinto, MD [facility] [phone]   09/22/2013 10:00 AM Melvenia Needles, NP [facility] [phone]   09/27/2013 9:00 AM Milus Banister, MD [facility] [phone]   11/24/2013 11:00 AM Vicie Mutters, PA-C [facility] [phone]   03/31/2014 9:30 AM Vicie Mutters, PA-C [facility] [phone]   Future Orders Complete By Expires   (HEART FAILURE PATIENTS) Call MD:  Anytime you have any of the following symptoms: 1) 3 pound weight gain in 24 hours or 5 pounds in 1 week 2) shortness of breath, with or without a dry hacking cough 3) swelling in the hands, feet or stomach 4) if you have to sleep on extra pillows at night in order to breathe.  As directed    Call MD for:  difficulty breathing, headache or visual disturbances  As directed    Call MD for:  extreme fatigue   As directed    Call MD for:  hives  As directed    Call MD for:  persistant dizziness or light-headedness  As directed    Call MD for:  persistant nausea and vomiting  As directed    Call MD for:  severe uncontrolled pain  As directed    Call MD for:  temperature >100.4  As directed    Diet - low sodium heart healthy  As directed    Discharge instructions  As directed    Comments:     You were hospitalized with pneumonia and you have completed your course of antibiotics while in the hospital.  You will not need to take any antibiotics when you return home.  Please increase your prednisone to $RemoveBefor'30mg'[id number]$  once a day and continue this dose until you follow up with Tammy from Pulmonology, appointment already scheduled.  Please continue to take your lasix every day and follow up with your primary care doctor in about 1 week -- their office will call you with an appointment time and date.  You will be set up with home hospice who will assist you with your PICC line and medications for anxiety and shortness of breath.  If you develop  worsening shortness of breath, please call the hospice care workers right away and/or return to the hospital.   Driving Restrictions  As directed    Comments:     No driving or operating heavy machinery   Increase activity slowly  As directed        Medication List    STOP taking these medications       voriconazole 200 MG tablet  Commonly known as:  VFEND      TAKE these medications       albuterol (2.5 MG/3ML) 0.083% nebulizer solution  Commonly known as:  PROVENTIL  Take 3 mLs (2.5 mg total) by nebulization 4 (four) times daily. DX 496     allopurinol 100 MG tablet  Commonly known as:  ZYLOPRIM  Take 100 mg by mouth daily.     ALPRAZolam 0.5 MG tablet  Commonly known as:  XANAX  Take 1 tablet (0.5 mg total) by mouth 3 (three) times daily as needed for anxiety.     cetirizine 10 MG tablet  Commonly known as:  ZYRTEC  Take 10 mg by mouth daily.     feeding  supplement (ENSURE COMPLETE) Liqd  Take 237 mLs by mouth 2 (two) times daily between meals.     Fluticasone-Salmeterol 500-50 MCG/DOSE Aepb  Commonly known as:  ADVAIR  Inhale 1 puff into the lungs 2 (two) times daily.     furosemide 40 MG tablet  Commonly known as:  LASIX  Take 40 mg by mouth daily.     HYDROcodone-acetaminophen 5-325 MG per tablet  Commonly known as:  NORCO/VICODIN  Take 1 tablet by mouth every 4 (four) hours as needed.     ipratropium 0.02 % nebulizer solution  Commonly known as:  ATROVENT  Take 2.5 mLs (0.5 mg total) by nebulization every 6 (six) hours as needed for wheezing or shortness of breath. Dx 496     leflunomide 20 MG tablet  Commonly known as:  ARAVA  Take 20 mg by mouth daily.     levothyroxine 50 MCG tablet  Commonly known as:  SYNTHROID, LEVOTHROID  TAKE 1 TABLET EVERY DAY     montelukast 10 MG tablet  Commonly known as:  SINGULAIR  Take 1 tablet by mouth daily.     morphine 10 MG/5ML solution  Take 2.5 mLs (5 mg total) by mouth every 6 (six) hours as needed for severe pain (Shortness of breath).     nystatin cream  Commonly known as:  MYCOSTATIN  Apply 1 application topically daily as needed (under breasts).     pantoprazole 40 MG tablet  Commonly known as:  PROTONIX  Take 1 tablet (40 mg total) by mouth daily.     potassium chloride SA 20 MEQ tablet  Commonly known as:  K-DUR,KLOR-CON  Take 60 mEq by mouth daily.     predniSONE 20 MG tablet  Commonly known as:  DELTASONE  Take 1.5 tablets (30 mg total) by mouth daily with breakfast.     SPIRIVA HANDIHALER 18 MCG inhalation capsule  Generic drug:  tiotropium  INHALE 1 PUFF DAILY     white petrolatum Gel  Commonly known as:  VASELINE  Apply 1 application topically as needed for lip care.       Follow-up Information   Follow up with MCKEOWN,WILLIAM DAVID, MD. Schedule an appointment as soon as possible for a visit in 1 week.   Specialty:  Internal Medicine   Contact  information:   [address]  [address] [place] [address] [phone]       Follow up with Rexene Edison, NP On 09/22/2013. (10AM)    Specialty:  Nurse Practitioner   Contact information:   [address] [place] [address] [phone]       Follow up with Vicie Mutters, PA-C.   Specialty:  Physician Assistant   Contact information:   [address] [place] [address] [phone]        The results of significant diagnostics from this hospitalization (including imaging, microbiology, ancillary and laboratory) are listed below for reference.    Significant Diagnostic Studies: Dg Chest 1 View  08/25/2013   CLINICAL DATA:  Pneumonia  EXAM: CHEST - 1 VIEW  COMPARISON:  August 13, 2013 and April 18, 2013 radiograph; chest CT August 10, 2013  FINDINGS: Central catheter is been removed. No pneumothorax. There is cicatrization and volume loss in both upper lobes. There is extensive bullous disease in both upper lobes. There is retraction of the hila bilaterally. There is no new opacity. There is no well-defined airspace consolidation.  There is underlying emphysema with small left effusion. Heart is mildly enlarged, stable.  IMPRESSION: No pneumothorax following catheter removal. Areas of cicatrization in both upper lobes with volume loss. There is emphysematous change with bullous disease in both upper lobes. Small left effusion. No new opacity.  It should be noted that underlying infection including potential granulomatous disease such as tuberculosis can present in this manner and is not excluded on this study.   Electronically Signed   By: Lowella Grip M.D.   On: 08/25/2013 11:34   Dg Chest 1 View  08/11/2013   CLINICAL DATA:  63 year old female with PICC line placement.  EXAM: CHEST - 1 VIEW  COMPARISON:  08/11/2013 chest radiograph  FINDINGS: A right PICC line is present with tip overlying the mid -upper SVC.  Chronic Lung disease with  scarring/ fibrosis and interstitial prominence again noted.  There is no evidence of pneumothorax.  No other significant changes are present.  IMPRESSION: Right PICC line with tip overlying the mid-upper SVC.  Chronic pulmonary changes.   Electronically Signed   By: Hassan Rowan M.D.   On: 08/11/2013 19:24   Dg Abd 1 View  08/29/2013   CLINICAL DATA:  Abdominal pain, constipation  EXAM: ABDOMEN - 1 VIEW  COMPARISON:  None.  FINDINGS: There is nonspecific nonobstructive bowel gas pattern. Moderate stool noted in right colon. Some gas noted in transverse colon.  IMPRESSION: Nonspecific nonobstructive bowel gas pattern. Moderate stool noted in right colon.   Electronically Signed   By: Lahoma Crocker M.D.   On: 08/29/2013 15:51   Ct Chest Wo Contrast  08/10/2013   CLINICAL DATA:  Left-sided chest pain.  Respiratory distress.  EXAM: CT CHEST WITHOUT CONTRAST  TECHNIQUE: Multidetector CT imaging of the chest was performed following the standard protocol without IV contrast.  COMPARISON:  03/24/2013  FINDINGS: The chest wall is unremarkable. No breast masses, supraclavicular or axillary lymphadenopathy. The bony thorax is intact. No destructive bone lesions or spinal canal compromise. Moderate degenerative changes noted in the thoracic spine and mild-to-moderate osteoporosis.  The heart is normal in size. No pericardial effusion. No mediastinal or hilar mass or adenopathy. Small scattered lymph nodes are stable. The aorta demonstrates scattered atherosclerotic calcifications. The esophagus is grossly normal.  Examination of the lung parenchyma demonstrates severe chronic lung disease with emphysema, pulmonary fibrosis, traction bronchiectasis and multiple lung cavities. There is an enlarging  left-sided pleural effusion with overlying atelectasis. There is also new airspace opacity in the left upper lobe posteriorly which could be a superimposed pneumonia.  The upper abdomen is unremarkable.  IMPRESSION: Severe chronic lung  disease as discussed above with suspected superimposed left upper lobe pneumonia.   Electronically Signed   By: Kalman Jewels M.D.   On: 08/10/2013 17:50   Ct Angio Chest W/cm &/or Wo Cm  08/29/2013   CLINICAL DATA:  COPD and pneumonia  EXAM: CT ANGIOGRAPHY CHEST WITH CONTRAST  TECHNIQUE: Multidetector CT imaging of the chest was performed using the standard protocol during bolus administration of intravenous contrast. Multiplanar CT image reconstructions and MIPs were obtained to evaluate the vascular anatomy.  CONTRAST:  132mL OMNIPAQUE IOHEXOL 350 MG/ML SOLN  COMPARISON:  DG CHEST 1V PORT dated 08/29/2013; CT CHEST W/O CM dated 08/10/2013; DG CHEST 2 VIEW dated 04/29/2013; CT CHEST W/CM dated 10/19/2012  FINDINGS: There are no filling defects in the pulmonary arterial tree to suggest acute pulmonary thromboembolism.  No evidence of aortic dissection. Maximal ascending aortic diameter is 3.7 cm. Atherosclerotic changes at the origin of the left common carotid artery and left subclavian artery are noted. Left ventricular hypertrophy is present.  No significant pericardial effusion is present. Stable coronary artery calcifications. Small mediastinal nodes are stable.  Small left pleural effusion is stable and is probably loculated.  Fibrotic lung disease within apical prep collection is stable. Scattered patchy opacities in the lower lungs are also stable. Confluent opacity in the superior segment of the left lower lobe is also stable. Severe emphysema.  No pneumothorax.  No acute bony deformity.  Round soft tissue density in the left breast is stable.  Review of the MIP images confirms the above findings.  IMPRESSION: No evidence of acute pulmonary thromboembolism.  Chronic lung disease.  Stable confluent pulmonary opacity in the superior segment of the left lower lobe previously characterized as pneumonia.   Electronically Signed   By: Maryclare Bean M.D.   On: 08/29/2013 17:16   Ir Fluoro Guide Cv Line  Right  08/30/2013   INDICATION: Poor venous access, in need of intravenous access for medication administration and blood draws. Patient has history of failed right upper extremity approach PICC line placement and requests recurrent right jugular approach PICC line placement.  EXAM: TUNNELED CENTRAL VENOUS HEMODIALYSIS CATHETER PLACEMENT WITH ULTRASOUND AND FLUOROSCOPIC GUIDANCE  MEDICATIONS: None  CONTRAST:  None  ANESTHESIA/SEDATION: None  FLUOROSCOPY TIME:  54 seconds.  COMPLICATIONS: None immediate  PROCEDURE: Informed written consent was obtained from the patient after a discussion of the risks, benefits, and alternatives to treatment. Questions regarding the procedure were encouraged and answered. The right neck and chest were prepped with chlorhexidine in a sterile fashion, and a sterile drape was applied covering the operative field. Maximum barrier sterile technique with sterile gowns and gloves were used for the procedure. A timeout was performed prior to the initiation of the procedure.  After creating a small venotomy incision, a micropuncture kit was utilized to access the right internal jugular vein under direct, real-time ultrasound guidance after the overlying soft tissues were anesthetized with 1% lidocaine with epinephrine. Ultrasound image documentation was performed. The microwire was kinked to measure appropriate catheter length. Over a micro wire, the micropuncture needle was exchanged for a exchanged for a peel-away sheath. A tunneled dual-lumen PICC measuring 24 cm from tip to cuff was tunneled in a retrograde fashion from the anterior chest wall to the venotomy incision.  The  catheter was then placed through the peel-away sheath with tips ultimately positioned within the superior aspect of the right atrium. Final catheter positioning was confirmed and documented with a spot radiographic image. The catheter aspirates and flushes normally. The catheter was flushed with appropriate volume  heparin dwells.  The catheter exit site was secured with a 0-Prolene retention suture. The venotomy incision was closed with an interrupted 4-0 Vicryl, Dermabond and Steri-strips. Dressings were applied. The patient tolerated the procedure well without immediate post procedural complication.  IMPRESSION: Successful placement of 24 cm tip to cuff tunneled dual-lumen PICC line via the right internal jugular vein with tips terminating within the superior aspect of the right atrium. The catheter is ready for immediate use.   Electronically Signed   By: Sandi Mariscal M.D.   On: 08/30/2013 17:33   Ir Fluoro Guide Cv Line Right  08/12/2013   INDICATION: Malfunctioning right upper extremity approach PICC line; in need of intravenous access for medication administration and blood draws.  EXAM: TUNNELED PICC PLACEMENT WITH ULTRASOUND AND FLUOROSCOPIC GUIDANCE  MEDICATIONS: The patient is currently admitted to the hospital and receiving intravenous antibiotics; The IV antibiotic was given in an appropriate time interval prior to skin puncture.  ANESTHESIA/SEDATION: None  CONTRAST:  None  FLUOROSCOPY TIME:  12 seconds.  PROCEDURE: Informed written consent was obtained from the patient after a discussion of the risks, benefits, and alternatives to treatment. Questions regarding the procedure were encouraged and answered. The right neck and chest were prepped with chlorhexidine in a sterile fashion, and a sterile drape was applied covering the operative field. Maximum barrier sterile technique with sterile gowns and gloves were used for the procedure. A timeout was performed prior to the initiation of the procedure.  After creating a small venotomy incision, a micropuncture kit was utilized to access the right internal jugular vein under direct, real-time ultrasound guidance after the overlying soft tissues were anesthetized with 1% lidocaine with epinephrine. Ultrasound image documentation was performed. The microwire was  kinked to measure appropriate catheter length. A stiff Glidewire was advanced to the level of the IVC and the micropuncture sheath was exchanged for a peel-away sheath. A dual lumen power PICC measuring 24 cm from tip to cuff was tunneled in a retrograde fashion from the anterior chest wall to the venotomy incision.  The catheter was then placed through the peel-away sheath with tips ultimately positioned within the superior aspect of the right atrium. Final catheter positioning was confirmed and documented with a spot radiographic image. The catheter aspirates and flushes normally. The catheter was flushed with appropriate volume heparin dwells.  The catheter exit site was secured with a 0-Prolene retention suture. The venotomy incision was closed with Dermabond and Steri-strips. Dressings were applied. The patient tolerated the procedure well without immediate post procedural complication.  The malfunctioning right upper extremity approach PICC line was then removed at the bedside.  COMPLICATIONS: None immediate  IMPRESSION: 1. Successful placement of 24 cm tip to cuff tunneled PICC via the right internal jugular vein with tip terminating within the superior aspect of the right atrium. The catheter is ready for immediate use. 2. Successful removal of right upper extremity approach PICC line.   Electronically Signed   By: Sandi Mariscal M.D.   On: 08/12/2013 18:23   Ir Fluoro Guide Cv Line Right  08/11/2013   CLINICAL DATA:  Rheumatoid arthritis  EXAM: Right upper extremity PICC LINE PLACEMENT WITH ULTRASOUND AND FLUOROSCOPIC GUIDANCE  FLUOROSCOPY TIME:  24  seconds.  PROCEDURE: The patient was advised of the possible risks andcomplications and agreed to undergo the procedure. The patient was then brought to the angiographic suite for the procedure.  The right arm was prepped with chlorhexidine, drapedin the usual sterile fashion using maximum barrier technique (cap and mask, sterile gown, sterile gloves, large  sterile sheet, hand hygiene and cutaneous antisepsis) and infiltrated locally with 1% Lidocaine.  Ultrasound demonstrated patency of the right basilic vein, and this was documented with an image. Under real-time ultrasound guidance, this vein was accessed with a 21 gauge micropuncture needle and image documentation was performed. A 0.018 wire was introduced in to the vein. Over this, a 5 [place] double lumen Power PICC was advanced to the lower SVC/right atrial junction. Fluoroscopy during the procedure and fluoro spot radiograph confirms appropriate catheter position. The catheter was flushed and covered with asterile dressing.  Complications: None.  IMPRESSION: Successful right arm Power PICC line placement with ultrasound and fluoroscopic guidance. The catheter is ready for use.   Electronically Signed   By: Maryclare Bean M.D.   On: 08/11/2013 12:40   Ir Chest Fluoro  08/12/2013   CLINICAL DATA:  PICC occluded  EXAM: CHEST FLUOROSCOPY  PROCEDURE: The right PICC entry site was prepped and draped in a sterile fashion. Fluoroscopic examination over the right arm demonstrates that the PICC is redundant and kinked in the subcutaneous fat. The PICC was retracted. A wire was inserted. The PICC was then advanced to the cavoatrial junction. It was flushed then secured in place.  FINDINGS: Tip of the PICC is now at the cavoatrial junction.  IMPRESSION: Successful manipulation of the right upper extremity PICC under sterile technique. The tip is now at the cavoatrial junction and it is functioning normally.   Electronically Signed   By: Maryclare Bean M.D.   On: 08/12/2013 08:39   Ir US Guide Vasc Access Right  08/30/2013   INDICATION: Poor venous access, in need of intravenous access for medication administration and blood draws. Patient has history of failed right upper extremity approach PICC line placement and requests recurrent right jugular approach PICC line placement.  EXAM: TUNNELED CENTRAL VENOUS HEMODIALYSIS CATHETER  PLACEMENT WITH ULTRASOUND AND FLUOROSCOPIC GUIDANCE  MEDICATIONS: None  CONTRAST:  None  ANESTHESIA/SEDATION: None  FLUOROSCOPY TIME:  54 seconds.  COMPLICATIONS: None immediate  PROCEDURE: Informed written consent was obtained from the patient after a discussion of the risks, benefits, and alternatives to treatment. Questions regarding the procedure were encouraged and answered. The right neck and chest were prepped with chlorhexidine in a sterile fashion, and a sterile drape was applied covering the operative field. Maximum barrier sterile technique with sterile gowns and gloves were used for the procedure. A timeout was performed prior to the initiation of the procedure.  After creating a small venotomy incision, a micropuncture kit was utilized to access the right internal jugular vein under direct, real-time ultrasound guidance after the overlying soft tissues were anesthetized with 1% lidocaine with epinephrine. Ultrasound image documentation was performed. The microwire was kinked to measure appropriate catheter length. Over a micro wire, the micropuncture needle was exchanged for a exchanged for a peel-away sheath. A tunneled dual-lumen PICC measuring 24 cm from tip to cuff was tunneled in a retrograde fashion from the anterior chest wall to the venotomy incision.  The catheter was then placed through the peel-away sheath with tips ultimately positioned within the superior aspect of the right atrium. Final catheter positioning was confirmed and documented with  a spot radiographic image. The catheter aspirates and flushes normally. The catheter was flushed with appropriate volume heparin dwells.  The catheter exit site was secured with a 0-Prolene retention suture. The venotomy incision was closed with an interrupted 4-0 Vicryl, Dermabond and Steri-strips. Dressings were applied. The patient tolerated the procedure well without immediate post procedural complication.  IMPRESSION: Successful placement of 24 cm  tip to cuff tunneled dual-lumen PICC line via the right internal jugular vein with tips terminating within the superior aspect of the right atrium. The catheter is ready for immediate use.   Electronically Signed   By: Sandi Mariscal M.D.   On: 08/30/2013 17:33   Ir US Guide Vasc Access Right  08/12/2013   INDICATION: Malfunctioning right upper extremity approach PICC line; in need of intravenous access for medication administration and blood draws.  EXAM: TUNNELED PICC PLACEMENT WITH ULTRASOUND AND FLUOROSCOPIC GUIDANCE  MEDICATIONS: The patient is currently admitted to the hospital and receiving intravenous antibiotics; The IV antibiotic was given in an appropriate time interval prior to skin puncture.  ANESTHESIA/SEDATION: None  CONTRAST:  None  FLUOROSCOPY TIME:  12 seconds.  PROCEDURE: Informed written consent was obtained from the patient after a discussion of the risks, benefits, and alternatives to treatment. Questions regarding the procedure were encouraged and answered. The right neck and chest were prepped with chlorhexidine in a sterile fashion, and a sterile drape was applied covering the operative field. Maximum barrier sterile technique with sterile gowns and gloves were used for the procedure. A timeout was performed prior to the initiation of the procedure.  After creating a small venotomy incision, a micropuncture kit was utilized to access the right internal jugular vein under direct, real-time ultrasound guidance after the overlying soft tissues were anesthetized with 1% lidocaine with epinephrine. Ultrasound image documentation was performed. The microwire was kinked to measure appropriate catheter length. A stiff Glidewire was advanced to the level of the IVC and the micropuncture sheath was exchanged for a peel-away sheath. A dual lumen power PICC measuring 24 cm from tip to cuff was tunneled in a retrograde fashion from the anterior chest wall to the venotomy incision.  The catheter was then  placed through the peel-away sheath with tips ultimately positioned within the superior aspect of the right atrium. Final catheter positioning was confirmed and documented with a spot radiographic image. The catheter aspirates and flushes normally. The catheter was flushed with appropriate volume heparin dwells.  The catheter exit site was secured with a 0-Prolene retention suture. The venotomy incision was closed with Dermabond and Steri-strips. Dressings were applied. The patient tolerated the procedure well without immediate post procedural complication.  The malfunctioning right upper extremity approach PICC line was then removed at the bedside.  COMPLICATIONS: None immediate  IMPRESSION: 1. Successful placement of 24 cm tip to cuff tunneled PICC via the right internal jugular vein with tip terminating within the superior aspect of the right atrium. The catheter is ready for immediate use. 2. Successful removal of right upper extremity approach PICC line.   Electronically Signed   By: Sandi Mariscal M.D.   On: 08/12/2013 18:23   Ir US Guide Vasc Access Right  08/11/2013   CLINICAL DATA:  Rheumatoid arthritis  EXAM: Right upper extremity PICC LINE PLACEMENT WITH ULTRASOUND AND FLUOROSCOPIC GUIDANCE  FLUOROSCOPY TIME:  24 seconds.  PROCEDURE: The patient was advised of the possible risks andcomplications and agreed to undergo the procedure. The patient was then brought to the angiographic suite for  the procedure.  The right arm was prepped with chlorhexidine, drapedin the usual sterile fashion using maximum barrier technique (cap and mask, sterile gown, sterile gloves, large sterile sheet, hand hygiene and cutaneous antisepsis) and infiltrated locally with 1% Lidocaine.  Ultrasound demonstrated patency of the right basilic vein, and this was documented with an image. Under real-time ultrasound guidance, this vein was accessed with a 21 gauge micropuncture needle and image documentation was performed. A 0.018 wire  was introduced in to the vein. Over this, a 5 [place] double lumen Power PICC was advanced to the lower SVC/right atrial junction. Fluoroscopy during the procedure and fluoro spot radiograph confirms appropriate catheter position. The catheter was flushed and covered with asterile dressing.  Complications: None.  IMPRESSION: Successful right arm Power PICC line placement with ultrasound and fluoroscopic guidance. The catheter is ready for use.   Electronically Signed   By: Maryclare Bean M.D.   On: 08/11/2013 12:40   Dg Chest Port 1 View  09/06/2013   CLINICAL DATA:  Respiratory failure.  EXAM: PORTABLE CHEST - 1 VIEW  COMPARISON:  09/05/2013 and 08/29/2013  FINDINGS: The tip of the central venous catheter is in the region of the right atrium and could be retracted 2-3 cm.  There is extensive scarring and emphysematous disease in both lungs, unchanged. Small left pleural effusion has diminished.  IMPRESSION: Slight decrease in small left pleural effusion. No other change. Central venous catheter tip is in the right atrium and could be retracted slightly.   Electronically Signed   By: Rozetta Nunnery M.D.   On: 09/06/2013 07:32   Dg Chest Port 1 View  09/05/2013   CLINICAL DATA:  Evaluate infiltrates.  EXAM: PORTABLE CHEST - 1 VIEW  COMPARISON:  Chest x-ray 08/29/2013.  FINDINGS: As with numerous prior examinations there are extensive irregular masslike areas of architectural distortion throughout the lungs bilaterally, particularly in the left mid to upper lung and in the right apex. No new acute consolidative airspace disease. No pleural effusions. No evidence of pulmonary edema. Heart size is normal. Upper mediastinal contours are grossly distorted, but similar to prior studies. Right internal jugular central venous catheter with tip terminating in the superior aspect of the right atrium.  IMPRESSION: 1. Appearance of the chest is similar to numerous prior examinations demonstrating extensive chronic scarring, as  above. No definite acute cardiopulmonary disease is identified at this time.   Electronically Signed   By: Vinnie Langton M.D.   On: 09/05/2013 05:37   Dg Chest Port 1 View  08/29/2013   CLINICAL DATA:  Shortness of breath, asthma, emphysema  EXAM: PORTABLE CHEST - 1 VIEW  COMPARISON:  08/25/2013 and 08/13/2013  FINDINGS: Cardiomediastinal silhouette is stable. Extensive scarring and volume loss bilateral upper lobe again noted. Stable emphysematous changes. Again noted hilar retraction bilaterally. No definite new consolidation is noted. Stable airspace opacities in upper lobes.  IMPRESSION: Extensive scarring and volume loss bilateral upper lobe again noted. Stable emphysematous changes. Again noted hilar retraction bilaterally. No definite new consolidation is noted. Stable airspace opacities in upper lobes.   Electronically Signed   By: Lahoma Crocker M.D.   On: 08/29/2013 13:23   Dg Chest Port 1 View  08/13/2013   CLINICAL DATA:  Evaluate respiratory failure  EXAM: PORTABLE CHEST - 1 VIEW  COMPARISON:  DG CHEST 1 VIEW dated 08/11/2013; DG CHEST 1V PORT dated 08/11/2013; DG CHEST 1V PORT dated 08/10/2013; CT CHEST W/O CM dated 08/10/2013  FINDINGS: Grossly unchanged cardiac  silhouette and mediastinal contours. Interval removal of right upper extremity approach PICC line and placement of a right internal jugular approach is catheter with tip projecting over the superior aspect of the right atrium. The lung volumes remain reduced with grossly unchanged apical predominant heterogeneous airspace opacities and volume loss, left greater than right. No new focal airspace opacities. Unchanged small left-sided effusion. No definite pneumothorax. Unchanged bones.  IMPRESSION: 1. Interval placement of right internal jugular approach central venous catheter with tip overlying the superior aspect of the right atrium. No pneumothorax. 2. Grossly unchanged biapical heterogeneous airspace opacities, left greater than right with  associated volume loss, possibly atelectasis or scar though underlying infection not excluded. 3. Unchanged small left-sided effusion.   Electronically Signed   By: Sandi Mariscal M.D.   On: 08/13/2013 08:38   Dg Chest Port 1 View  08/11/2013   CLINICAL DATA:  Cough.  EXAM: PORTABLE CHEST - 1 VIEW  COMPARISON:  CT CHEST W/O CM dated 08/10/2013; DG CHEST 1V PORT dated 08/10/2013; DG CHEST 2 VIEW dated 08/03/2013; DG CHEST 1V PORT dated 03/23/2013  FINDINGS: Mediastinum is stable. Severe biapical pleural parenchymal thickening and interstitial prominence noted with retraction of the hilum consistent with scarring. Pulmonary density in the left upper lobe although possibly related to scarring has progressed from prior year's exam. Chest CT suggested for further evaluation. Heart size normal. Changes of pleural scarring left base . No pneumothorax. No acute osseous abnormality.  IMPRESSION: 1. Biapical severe pleural parenchymal scarring and interstitial fibrosis. 2. Left upper lung pulmonary density has progressed in size over prior year. Chest CT is suggested for further evaluation   Electronically Signed   By: Marcello Moores  Register   On: 08/11/2013 09:42   Dg Chest Port 1 View  08/10/2013   CLINICAL DATA:  resp distress  EXAM: PORTABLE CHEST - 1 VIEW  COMPARISON:  DG CHEST 2 VIEW dated 08/03/2013; DG CHEST 2 VIEW dated 10/23/2012; DG CHEST 1V PORT dated 03/22/2013  FINDINGS: The interstitial findings within the right and left lung apices are stable when compared to the previous study and when correlated with prior imaging. Hilar retraction is once again appreciated. No new focal regions of consolidation or new focal infiltrates appreciated. Cardiac silhouette is within normal limits. There is blunting of the left costophrenic angle also stable there is prominence of the interstitial markings within the remaining aerated portions of the lungs which appears stable. No new focal regions of consolidation or new focal infiltrates.   IMPRESSION: Stable chronic changes within the right and left lung apices as described above and left lung base.   Electronically Signed   By: Margaree Mackintosh M.D.   On: 08/10/2013 16:32   US Abdomen Limited Ruq  08/30/2013   CLINICAL DATA:  Right upper quadrant pain  EXAM: US ABDOMEN LIMITED - RIGHT UPPER QUADRANT  COMPARISON:  None.  FINDINGS: Gallbladder:  Evaluation mildly limited by body habitus but the gallbladder appears normal with no wall thickening or stones. There is however a positive sonographic Murphy's sign.  Common bile duct:  Diameter: 2 mm  Liver:  Inhomogeneous with coarsened echotexture. Very limited evaluation. Cannot exclude potential focal abnormalities.  IMPRESSION: 1. There is a positive sonographic Murphy's sign. Evaluation of the gallbladder is limited by body habitus, but no stones or wall thickening appreciated. Significance therefore uncertain. 2. Very limited evaluation of the liver likely due to a combination of fatty infiltration and patient body habitus.   Electronically Signed   By:  Skipper Cliche M.D.   On: 08/30/2013 21:31    Microbiology: No results found for this or any previous visit (from the past 240 hour(s)).   Labs: Basic Metabolic Panel:  Recent Labs Lab 09/07/13 0520  NA 142  K 3.3*  CL 99  CO2 32  GLUCOSE 93  BUN 14  CREATININE 0.55  CALCIUM 8.8   Liver Function Tests: No results found for this basename: AST, ALT, ALKPHOS, BILITOT, PROT, ALBUMIN,  in the last 168 hours No results found for this basename: LIPASE, AMYLASE,  in the last 168 hours No results found for this basename: AMMONIA,  in the last 168 hours CBC:  Recent Labs Lab 09/07/13 0520  WBC 6.4  HGB 8.5*  HCT 29.7*  MCV 82.0  PLT 121*   Cardiac Enzymes: No results found for this basename: CKTOTAL, CKMB, CKMBINDEX, TROPONINI,  in the last 168 hours BNP: BNP (last 3 results)  Recent Labs  03/25/13 0601 04/14/13 2300 08/10/13 1554  PROBNP 1150.0* 301.6* 1099.0*    CBG:  Recent Labs Lab 09/07/13 1424 09/07/13 1700 09/07/13 2129 09/08/13 0738 09/08/13 1144  GLUCAP 190* 136* 116* 88 189*    Time coordinating discharge: 45 minutes  Signed:  Caitlen Worth  [facility] 09/12/2013, 9:58 AM

## 2013-09-09 ENCOUNTER — Telehealth: Payer: Self-pay | Admitting: *Deleted

## 2013-09-09 NOTE — Telephone Encounter (Signed)
Patient called. She has been discharged from hospital and asked which meds she should be taking.  Per Quentin Mulling, PA, she should take meds on discharge summary and scheduled OV for follow-up visit next week.

## 2013-09-12 ENCOUNTER — Telehealth: Payer: Self-pay | Admitting: Critical Care Medicine

## 2013-09-12 DIAGNOSIS — J449 Chronic obstructive pulmonary disease, unspecified: Secondary | ICD-10-CM

## 2013-09-12 LAB — FUNGUS CULTURE W SMEAR: FUNGAL SMEAR: NONE SEEN

## 2013-09-12 NOTE — Telephone Encounter (Signed)
Called spoke with Almira Coaster. She reports she received a call from patient daughter today asking when next visit will be. She had advised them she saw pt was d/c with hospice [facility] 09/08/13. Per daughter pt did not go with hospice. Then pt got on the phone with Almira Coaster and advised her of the same. Please advise Dr. Delford Field thanks

## 2013-09-12 NOTE — Telephone Encounter (Signed)
Resume [facility], i understood as well the pt was not ready for hospice services

## 2013-09-12 NOTE — Telephone Encounter (Signed)
I called and LMTCB x1

## 2013-09-13 ENCOUNTER — Other Ambulatory Visit (HOSPITAL_COMMUNITY): Payer: Self-pay | Admitting: Emergency Medicine

## 2013-09-13 NOTE — Telephone Encounter (Signed)
Order placed. Jahking Lesser, CMA  

## 2013-09-14 ENCOUNTER — Encounter: Payer: Self-pay | Admitting: Internal Medicine

## 2013-09-14 ENCOUNTER — Ambulatory Visit (INDEPENDENT_AMBULATORY_CARE_PROVIDER_SITE_OTHER): Payer: Medicare Other | Admitting: Internal Medicine

## 2013-09-14 ENCOUNTER — Other Ambulatory Visit: Payer: Self-pay | Admitting: Internal Medicine

## 2013-09-14 VITALS — BP 100/56 | HR 84 | Temp 97.9°F | Resp 20

## 2013-09-14 DIAGNOSIS — Z79899 Other long term (current) drug therapy: Secondary | ICD-10-CM

## 2013-09-14 DIAGNOSIS — I10 Essential (primary) hypertension: Secondary | ICD-10-CM

## 2013-09-14 DIAGNOSIS — J9620 Acute and chronic respiratory failure, unspecified whether with hypoxia or hypercapnia: Secondary | ICD-10-CM

## 2013-09-14 DIAGNOSIS — E1129 Type 2 diabetes mellitus with other diabetic kidney complication: Secondary | ICD-10-CM

## 2013-09-14 LAB — MAGNESIUM: MAGNESIUM: 1.7 mg/dL (ref 1.5–2.5)

## 2013-09-14 LAB — BASIC METABOLIC PANEL WITH GFR
BUN: 26 mg/dL — ABNORMAL HIGH (ref 6–23)
CHLORIDE: 97 meq/L (ref 96–112)
CO2: 28 mEq/L (ref 19–32)
Calcium: 9.3 mg/dL (ref 8.4–10.5)
Creat: 0.9 mg/dL (ref 0.50–1.10)
GFR, EST AFRICAN AMERICAN: 79 mL/min
GFR, EST NON AFRICAN AMERICAN: 69 mL/min
Glucose, Bld: 145 mg/dL — ABNORMAL HIGH (ref 70–99)
POTASSIUM: 3.8 meq/L (ref 3.5–5.3)
SODIUM: 141 meq/L (ref 135–145)

## 2013-09-14 LAB — HEMOGLOBIN A1C
HEMOGLOBIN A1C: 5.7 % — AB (ref <5.7)
MEAN PLASMA GLUCOSE: 117 mg/dL — AB (ref <117)

## 2013-09-14 LAB — CBC WITH DIFFERENTIAL/PLATELET
BASOS ABS: 0 10*3/uL (ref 0.0–0.1)
BASOS PCT: 0 % (ref 0–1)
Eosinophils Absolute: 0 10*3/uL (ref 0.0–0.7)
Eosinophils Relative: 0 % (ref 0–5)
HEMATOCRIT: 33.8 % — AB (ref 36.0–46.0)
HEMOGLOBIN: 10.4 g/dL — AB (ref 12.0–15.0)
LYMPHS PCT: 5 % — AB (ref 12–46)
Lymphs Abs: 0.4 10*3/uL — ABNORMAL LOW (ref 0.7–4.0)
MCH: 23.6 pg — ABNORMAL LOW (ref 26.0–34.0)
MCHC: 30.8 g/dL (ref 30.0–36.0)
MCV: 76.8 fL — ABNORMAL LOW (ref 78.0–100.0)
Monocytes Absolute: 0.9 10*3/uL (ref 0.1–1.0)
Monocytes Relative: 12 % (ref 3–12)
NEUTROS PCT: 83 % — AB (ref 43–77)
Neutro Abs: 6.4 10*3/uL (ref 1.7–7.7)
Platelets: 194 10*3/uL (ref 150–400)
RBC: 4.4 MIL/uL (ref 3.87–5.11)
RDW: 24.3 % — AB (ref 11.5–15.5)
WBC: 7.7 10*3/uL (ref 4.0–10.5)

## 2013-09-14 MED ORDER — MOMETASONE FURO-FORMOTEROL FUM 200-5 MCG/ACT IN AERO: 2.0000 | INHALATION_SPRAY | Freq: Two times a day (BID) | RESPIRATORY_TRACT | Status: DC

## 2013-09-14 MED ORDER — NYSTATIN 100000 UNIT/GM EX CREA: 1.0000 "application " | TOPICAL_CREAM | Freq: Every day | CUTANEOUS | Status: DC | PRN

## 2013-09-14 MED ORDER — IPRATROPIUM BROMIDE 0.02 % IN SOLN: RESPIRATORY_TRACT | Status: DC

## 2013-09-14 NOTE — Progress Notes (Signed)
Patient ID: PRIYANKA CAUSEY, female   DOB: 01-Jul-1950, 63 y.o.   MRN: [id number]    This very nice but unfortunate  and tragically pre-terminal 63 y.o. MWF with End Stage COPD presents for hospital follow up from admission Mar 16-26  W/ CAP whereafter she was discharged home with Hospice and since has declined Hospice care , altho she adamantly declines CPR as she is aware of the futility of CPR for her. Other problems include  Hypertension, Hyperlipidemia, Pre-Diabetes and Vitamin D Deficiency.    Since hospitalization she has been very sedentary - only sitting in a recliner chair and up only to bedside commode chair. Currently receiving sponge baths with family assistance. Since Hospice was declined, she has requested [facility] become re-involved with her homecare. Currently she is stable on 3 LPM O2. She does have a congested non- productive cough. She denies fever or chills.    HTN has been controlled at home. Today's BP: 100/56 mmHg . Patient denies any cardiac type chest pain, palpitations or   dizziness. She does have chronic assymetric edema  (R>L)   Also, the patient has history of PreDiabetes/insulin resistance with last A1c of 5.4% with elevated insulin 48 in August 2014.  Patient denies any symptoms of reactive hypoglycemia, diabetic polys, paresthesias or visual blurring.   Further, Patient has history of Vitamin D Deficiency with last vitamin D of 91 in Aug 2014. Patient supplements vitamin D without any suspected side-effects.  Medication Sig  . albuterol (2.5 MG/3ML) 0.083% nebulizer solution Take 3 mLs (2.5 mg total) by nebulization 4 (four) times daily. DX 496  . ALPRAZolam (XANAX) 0.5 MG tablet Take 1 tablet (0.5 mg total) by mouth 3 (three) times daily as needed for anxiety.  . cetirizine (ZYRTEC) 10 MG tablet Take 10 mg by mouth daily.  . feeding supplement, ENSURE COMPLETE, (ENSURE COMPLETE) LIQD Take 237 mLs by mouth 2 (two) times daily between meals.  . furosemide (LASIX) 40  MG tablet Take 40 mg by mouth daily.   Marland Kitchen ipratropium (ATROVENT) 0.02 % nebulizer solution Take 2.5 mLs (0.5 mg total) by nebulization  4 x daily  . leflunomide (ARAVA) 20 MG tablet Take 20 mg by mouth daily.  Marland Kitchen levothyroxine  50 MCG tablet TAKE 1 TABLET EVERY DAY  . nystatin cream (MYCOSTATIN) Apply 1 application topically daily as needed (under breasts).  . pantoprazole (PROTONIX) 40 MG tablet Take 1 tablet (40 mg total) by mouth daily.  . potassium chloride SA  20 MEQ  Take 60 mEq by mouth daily.  . predniSONE (DELTASONE) 20 MG tablet Take 1.5 tablets (30 mg total) by mouth daily with breakfast.  . white petrolatum (VASELINE) GEL Apply 1 application topically as needed for lip care.  Marland Kitchen SPIRIVA HANDIHALER 18 MCG inhalation capsule INHALE 1 PUFF DAILY   Allergies  Allergen Reactions  . Chantix [Varenicline] Other (See Comments)    Unknown " blurry vision"  . Hctz [Hydrochlorothiazide] Other (See Comments)    unknown  . Penicillins Rash  . Sulfonamide Derivatives Rash  . Tetanus Toxoid Other (See Comments)    Knot on arm    PMHx:   Past Medical History  Diagnosis Date  . Asthma   . Emphysema     02 dependent 3L/min  . Emphysema   . Pneumonia   . Hypothyroidism   . Diabetes mellitus, type II   . Obesity   . Diastolic heart failure 09/22/2012    Grade 1  . Emphysema   .  Rheumatoid arthritis(714.0)   . Hyperlipidemia   . Hypertension   . Vitamin D deficiency     FHx:    Reviewed / unchanged  SHx:    Reviewed / unchanged   Systems Review: Constitutional: Denies fever, chills, wt changes, headaches, insomnia, fatigue, night sweats, change in appetite. Eyes: Denies redness, blurred vision, diplopia, discharge, itchy, watery eyes.  ENT: Denies discharge, congestion, post nasal drip, epistaxis, sore throat, earache, hearing loss, dental pain, tinnitus, vertigo, sinus pain, snoring.  CV: Denies chest pain, palpitations, irregular heartbeat, syncope, diaphoresis, claudication,  edema. Respiratory: Chronic dyspnea relieved somewhat on 2-3 liters/min O2 and has non productive cough  Gastrointestinal: Denies dysphagia, odynophagia, heartburn, reflux, water brash, abdominal pain or cramps, nausea, vomiting, bloating, diarrhea, constipation, hematemesis, melena, hematochezia,  or hemorrhoids. Genitourinary: Denies dysuria, frequency, urgency, nocturia, hesitancy, discharge, hematuria, flank pain. Musculoskeletal: Denies arthralgias, myalgias, stiffness, jt. swelling, pain, limp, strain/sprain.  Skin: Denies pruritus, rash, hives, warts, acne, eczema, change in skin lesion(s). Neuro: No weakness, tremor, incoordination, spasms, paresthesia, or pain. Psychiatric: Denies confusion, memory loss, or sensory loss. Endo: Denies change in weight, skin, hair change.  Heme/Lymph: No excessive bleeding, bruising, orenlarged lymph nodes.   Exam:    BP 100/56  Pulse 84  Temp(Src) 97.9 F (36.6 C) (Temporal)  Resp 20  Appears over nourished and morbidly and perniciously obese with cushenoid facies - in no distress on nasal O2 @ 3 lpm. Eyes: PERRLA, EOMs, conjunctiva no swelling or erythema. ENT/Mouth: EAC's clear, TM's nl w/o erythema, bulging. Nares clear w/o erythema, swelling, exudates. Oropharynx clear without erythema or exudates. Oral hygiene is good. Tongue normal, non obstructing.  Neck: Supple. Thyroid nl. Car 2+/2+ without bruits. Chest: Barrel chested.  Respirations distant with scattered coarse rhonchi, but no  wheezing or stridor.  Cor: Heart sounds distant w/ RRR without sig. murmurs, gallops noted. Peripheral pulses 1 +   With assym. Edema (R>L).  Abdomen: Soft & bowel sounds normal. Non-tender w/o guarding.  Musculoskeletal: Full ROM all peripheral extremities and decreased muscle tone and bulk.  Skin: Warm, dry without exposed rashes, lesions, ecchymosis apparent.  Neuro: Cranial nerves intact, reflexes equal bilaterally. Sensory-motor testing grossly intact.   Pysch: Alert & oriented x 3. Insight and judgement nl & appropriate. No ideations.  Assessment and Plan:  1. Hypertension - Continue monitor blood pressure at home. Continue diet/meds same.  2. Hyperlipidemia - Continue diet, exercise,& lifestyle modifications. Continue monitor periodic cholesterol/liver & renal functions   3. T2 NIDDM w/Stage II CKD - continue recommend prudent low glycemic diet, weight control, diabetic monitoring. 4. Vitamin D Deficiency - Continue supplementation.  5. Acute on Chronic Respiratory Failure due to COPD and Rheumatoid Lung - Endstage  6. Acute on Chronic Diastolic Heart Failure, HX/o  Recommended regular exercise, BP monitoring, weight control, and discussed med and SE's. Recommended labs to assess and monitor clinical status. Further disposition pending results of labs.

## 2013-09-14 NOTE — Patient Instructions (Signed)
Chronic Obstructive Pulmonary Disease  Chronic obstructive pulmonary disease (COPD) is a common lung condition in which airflow from the lungs is limited. COPD is a general term that can be used to describe many different lung problems that limit airflow, including both chronic bronchitis and emphysema.  If you have COPD, your lung function will probably never return to normal, but there are measures you can take to improve lung function and make yourself feel better.   CAUSES   · Smoking (common).    · Exposure to secondhand smoke.    · Genetic problems.  · Chronic inflammatory lung diseases or recurrent infections.  SYMPTOMS   · Shortness of breath, especially with physical activity.    · Deep, persistent (chronic) cough with a large amount of thick mucus.    · Wheezing.    · Rapid breaths (tachypnea).    · Gray or bluish discoloration (cyanosis) of the skin, especially in fingers, toes, or lips.    · Fatigue.    · Weight loss.    · Frequent infections or episodes when breathing symptoms become much worse (exacerbations).    · Chest tightness.  DIAGNOSIS   Your healthcare provider will take a medical history and perform a physical examination to make the initial diagnosis.  Additional tests for COPD may include:   · Lung (pulmonary) function tests.  · Chest X-ray.  · CT scan.  · Blood tests.  TREATMENT   Treatment available to help you feel better when you have COPD include:   · Inhaler and nebulizer medicines. These help manage the symptoms of COPD and make your breathing more comfortable  · Supplemental oxygen. Supplemental oxygen is only helpful if you have a low oxygen level in your blood.    · Exercise and physical activity. These are beneficial for nearly all people with COPD. Some people may also benefit from a pulmonary rehabilitation program.  HOME CARE INSTRUCTIONS   · Take all medicines (inhaled or pills) as directed by your health care provider.  · Only take over-the-counter or prescription medicines  for pain, fever, or discomfort as directed by your health care provider.    · Avoid over-the-counter medicines or cough syrups that dry up your airway (such as antihistamines) and slow down the elimination of secretions unless instructed otherwise by your healthcare provider.    · If you are a smoker, the most important thing that you can do is stop smoking. Continuing to smoke will cause further lung damage and breathing trouble. Ask your health care provider for help with quitting smoking. He or she can direct you to community resources or hospitals that provide support.  · Avoid exposure to irritants such as smoke, chemicals, and fumes that aggravate your breathing.  · Use oxygen therapy and pulmonary rehabilitation if directed by your health care provider. If you require home oxygen therapy, ask your healthcare provider whether you should purchase a pulse oximeter to measure your oxygen level at home.    · Avoid contact with individuals who have a contagious illness.  · Avoid extreme temperature and humidity changes.  · Eat healthy foods. Eating smaller, more frequent meals and resting before meals may help you maintain your strength.  · Stay active, but balance activity with periods of rest. Exercise and physical activity will help you maintain your ability to do things you want to do.  · Preventing infection and hospitalization is very important when you have COPD. Make sure to receive all the vaccines your health care provider recommends, especially the pneumococcal and influenza vaccines. Ask your healthcare provider whether you   need a pneumonia vaccine.  · Learn and use relaxation techniques to manage stress.  · Learn and use controlled breathing techniques as directed by your health care provider. Controlled breathing techniques include:    · Pursed lip breathing. Start by breathing in (inhaling) through your nose for 1 second. Then, purse your lips as if you were going to whistle and breathe out (exhale)  through the pursed lips for 2 seconds.    · Diaphragmatic breathing. Start by putting one hand on your abdomen just above your waist. Inhale slowly through your nose. The hand on your abdomen should move out. Then purse your lips and exhale slowly. You should be able to feel the hand on your abdomen moving in as you exhale.    · Learn and use controlled coughing to clear mucus from your lungs. Controlled coughing is a series of short, progressive coughs. The steps of controlled coughing are:    1. Lean your head slightly forward.    2. Breathe in deeply using diaphragmatic breathing.    3. Try to hold your breath for 3 seconds.    4. Keep your mouth slightly open while coughing twice.    5. Spit any mucus out into a tissue.    6. Rest and repeat the steps once or twice as needed.  SEEK MEDICAL CARE IF:   · You are coughing up more mucus than usual.    · There is a change in the color or thickness of your mucus.    · Your breathing is more labored than usual.    · Your breathing is faster than usual.    SEEK IMMEDIATE MEDICAL CARE IF:   · You have shortness of breath while you are resting.    · You have shortness of breath that prevents you from:  · Being able to talk.    · Performing your usual physical activities.    · You have chest pain lasting longer than 5 minutes.    · Your skin color is more cyanotic than usual.  · You measure low oxygen saturations for longer than 5 minutes with a pulse oximeter.  MAKE SURE YOU:   · Understand these instructions.  · Will watch your condition.  · Will get help right away if you are not doing well or get worse.  Document Released: 03/12/2005 Document Revised: 03/23/2013 Document Reviewed: 01/27/2013  [employer]® Patient Information ©2014 [employer].

## 2013-09-16 ENCOUNTER — Other Ambulatory Visit: Payer: Self-pay | Admitting: Emergency Medicine

## 2013-09-16 MED ORDER — MOMETASONE FURO-FORMOTEROL FUM 200-5 MCG/ACT IN AERO: 2.0000 | INHALATION_SPRAY | Freq: Two times a day (BID) | RESPIRATORY_TRACT | Status: DC

## 2013-09-22 ENCOUNTER — Ambulatory Visit (INDEPENDENT_AMBULATORY_CARE_PROVIDER_SITE_OTHER)
Admission: RE | Admit: 2013-09-22 | Discharge: 2013-09-22 | Disposition: A | Discharge status: Home / Self Care | Payer: Medicare Other | Source: Ambulatory Visit | Attending: Adult Health | Admitting: Adult Health

## 2013-09-22 ENCOUNTER — Encounter: Payer: Self-pay | Admitting: Adult Health

## 2013-09-22 ENCOUNTER — Other Ambulatory Visit: Payer: Self-pay | Admitting: Adult Health

## 2013-09-22 ENCOUNTER — Ambulatory Visit (INDEPENDENT_AMBULATORY_CARE_PROVIDER_SITE_OTHER): Payer: Medicare Other | Admitting: Adult Health

## 2013-09-22 VITALS — BP 120/64 | HR 118 | Temp 97.4°F

## 2013-09-22 DIAGNOSIS — J449 Chronic obstructive pulmonary disease, unspecified: Secondary | ICD-10-CM

## 2013-09-22 DIAGNOSIS — J189 Pneumonia, unspecified organism: Secondary | ICD-10-CM

## 2013-09-22 NOTE — Patient Instructions (Signed)
Continue on Prednisone 30mg  daily .  Continue on current regimen .  follow up Dr.  In 2-3 weeks and As needed   We are setting up [facility] to help at home.  We are setting up PICC line care thru [facility] at home  Order sent for wheelchair .  Please contact office for sooner follow up if symptoms do not improve or worsen or seek emergency care

## 2013-09-23 NOTE — Assessment & Plan Note (Signed)
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  Continue on current regimen .  follow up Dr.  In 2-3 weeks and As needed   We are setting up [facility] to help at home.  We are setting up PICC line care thru [facility] at home  Order sent for wheelchair .  Please contact office for sooner follow up if symptoms do not improve or worsen or seek emergency care

## 2013-09-23 NOTE — Progress Notes (Signed)
Subjective:    Patient ID: Jacqueline Washington, female    DOB: October 06, 1950, 63 y.o.   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  08/25/12  Post Hospital follow up  Patient was admitted February 25 through 08/16/2013 for acute on chronic hypoxic respiratory failure with healthcare associated pneumonia. In the setting of rheumatoid arthritis pulmonary fibrosis and cavitary pulmonary aspergillosis. Patient was treated with IV antibiotics, slow prednisone taper, and nebulized bronchodilators. A 2-D echo showed grade 1 diastolic dysfunction. Aspergillus Ab was neg (<1.8) , Aspergillus ag 0.11 (neg) .  Fungal cx prelim neg. Sputum cx neg. BC neg  CT chest on January 25, showed severe chronic lung disease with emphysema. Pulmonary fibrosis, and traction bronchiectasis, with multiple lung cavities. There was a left sided pleural effusion with overlying atelectasis. New airspace opacity In the left upper lobe questionable fungal ball.  Started on VFEND at discharge .  Since discharge. Patient is feeling better.  She denies any hemoptysis, orthopnea, PND, or increased leg swelling. Reports breathing is improving every day since discharge.  no new complaints.  On prednisone 35mg  daily - to taper by 5mg  every 7d to baseline of 20mg  daily  On O2 6l/m at rest and 8/m with walking w/ sats >90%.  CXR w/ chronic changes , no acute process noted.  >>taper steroids   09/22/13 Post Hospital follow up  Patient returns for a post hospital followup She was readmitted March 16 2 March 26 for recurrent healthcare associated pneumonia, acute on chronic respiratory failure, COPD, exacerbation, and acute on chronic decompensated diastolic congestive heart failure  Tx broad-spectrum antibiotics including voriconazole for possible pulmonary aspergillosis. She completed a total of 10 days of vancomycin  and Primaxin, last day on March 26. Her voriconazole was subsequently discontinued on March 17 as she was thought not to have aspergillosis. She was started on IV Solu-Medrol and her dose with taper to 30 mg of prednisone once daily, tx w/ diuresis.  PICC line placed by IR.  Pt was discharged on Hospice however has refused upon arrival home. Has not had PICC care since discharge.  Requests wheelchair as hers is old and too heavy.  Since discharge feeling some better but very weak.  Coughing up thick mucus on/off.  Patient denies any fever or hemoptysis, orthopnea, PND, or leg   Review of Systems  Constitutional:   No  weight loss, night sweats,  Fevers, chills,  +fatigue, or  lassitude.  HEENT:   No headaches,  Difficulty swallowing,  Tooth/dental problems, or  Sore throat,                No sneezing, itching, ear ache, nasal congestion, post nasal drip,   CV:  No chest pain,  Orthopnea, PND, swelling in lower extremities, anasarca, dizziness, palpitations, syncope.   GI  No heartburn, indigestion, abdominal pain, nausea, vomiting, diarrhea, change in bowel habits, loss of appetite, bloody stools.   Resp:    No chest wall deformity  Skin: no rash or lesions.  GU: no dysuria, change in color of urine, no urgency or frequency.  No flank pain, no hematuria   MS:  No joint pain or swelling.  No decreased range of motion.  No back pain.  Psych:  No change in mood or affect. No depression or anxiety.  No memory loss.         Objective:  Physical Exam  GEN: A/Ox3; pleasant , NAD, elderly , chronically ill appearing   HEENT:  [place]/AT,  EACs-clear, TMs-wnl, NOSE-clear, THROAT-clear, no lesions, no postnasal drip or exudate noted.   NECK:  Supple w/ fair ROM; no JVD; normal carotid impulses w/o bruits; no thyromegaly or nodules palpated; no lymphadenopathy.  RESP    Decreased BS in bases, tr rhonchi ,  no dullness to percussion  CARD:  RRR, no m/r/g  , tr  peripheral edema, pulses  intact, no cyanosis or clubbing. R upper chest wall , PICC -double lumen in place, dsg clean /dry.   GI:   Soft & nt; nml bowel sounds; no organomegaly or masses detected.  Musco: Warm bil, no deformities or joint swelling noted.   Neuro: alert, no focal deficits noted.    Skin: Warm, no lesions or rashes         Assessment & Plan:

## 2013-09-26 ENCOUNTER — Telehealth: Payer: Self-pay | Admitting: Critical Care Medicine

## 2013-09-26 DIAGNOSIS — J449 Chronic obstructive pulmonary disease, unspecified: Secondary | ICD-10-CM

## 2013-09-26 DIAGNOSIS — J4489 Other specified chronic obstructive pulmonary disease: Secondary | ICD-10-CM

## 2013-09-26 NOTE — Telephone Encounter (Signed)
Spoke with Melissa. Order placed under for in home use DME wheelchair. Nothing further needed

## 2013-09-27 ENCOUNTER — Ambulatory Visit: Payer: Medicare Other | Admitting: Gastroenterology

## 2013-09-28 ENCOUNTER — Other Ambulatory Visit: Payer: Self-pay | Admitting: *Deleted

## 2013-09-28 MED ORDER — FIRST-DUKES MOUTHWASH MT SUSP: OROMUCOSAL | Status: DC

## 2013-09-28 NOTE — Addendum Note (Signed)
Addended by: Boone Master E on: 09/28/2013 03:58 PM   Modules accepted: Orders

## 2013-09-29 LAB — FUNGUS CULTURE W SMEAR: FUNGAL SMEAR: NONE SEEN

## 2013-10-01 ENCOUNTER — Inpatient Hospital Stay (HOSPITAL_COMMUNITY)
Admission: EM | Admit: 2013-10-01 | Discharge: 2013-10-07 | DRG: 871 | Disposition: A | Discharge status: Hospice | Payer: Medicare Other | Attending: Internal Medicine | Admitting: Internal Medicine

## 2013-10-01 ENCOUNTER — Emergency Department (HOSPITAL_COMMUNITY): Payer: Medicare Other

## 2013-10-01 ENCOUNTER — Encounter (HOSPITAL_COMMUNITY): Payer: Self-pay | Admitting: Emergency Medicine

## 2013-10-01 DIAGNOSIS — I509 Heart failure, unspecified: Secondary | ICD-10-CM | POA: Diagnosis present

## 2013-10-01 DIAGNOSIS — J189 Pneumonia, unspecified organism: Secondary | ICD-10-CM

## 2013-10-01 DIAGNOSIS — J471 Bronchiectasis with (acute) exacerbation: Secondary | ICD-10-CM

## 2013-10-01 DIAGNOSIS — R531 Weakness: Secondary | ICD-10-CM

## 2013-10-01 DIAGNOSIS — Z825 Family history of asthma and other chronic lower respiratory diseases: Secondary | ICD-10-CM

## 2013-10-01 DIAGNOSIS — N182 Chronic kidney disease, stage 2 (mild): Secondary | ICD-10-CM | POA: Diagnosis present

## 2013-10-01 DIAGNOSIS — L8992 Pressure ulcer of unspecified site, stage 2: Secondary | ICD-10-CM | POA: Diagnosis present

## 2013-10-01 DIAGNOSIS — Z8249 Family history of ischemic heart disease and other diseases of the circulatory system: Secondary | ICD-10-CM

## 2013-10-01 DIAGNOSIS — E119 Type 2 diabetes mellitus without complications: Secondary | ICD-10-CM | POA: Diagnosis present

## 2013-10-01 DIAGNOSIS — Z888 Allergy status to other drugs, medicaments and biological substances status: Secondary | ICD-10-CM

## 2013-10-01 DIAGNOSIS — E43 Unspecified severe protein-calorie malnutrition: Secondary | ICD-10-CM

## 2013-10-01 DIAGNOSIS — Z882 Allergy status to sulfonamides status: Secondary | ICD-10-CM

## 2013-10-01 DIAGNOSIS — J9620 Acute and chronic respiratory failure, unspecified whether with hypoxia or hypercapnia: Secondary | ICD-10-CM

## 2013-10-01 DIAGNOSIS — D638 Anemia in other chronic diseases classified elsewhere: Secondary | ICD-10-CM | POA: Diagnosis present

## 2013-10-01 DIAGNOSIS — I129 Hypertensive chronic kidney disease with stage 1 through stage 4 chronic kidney disease, or unspecified chronic kidney disease: Secondary | ICD-10-CM | POA: Diagnosis present

## 2013-10-01 DIAGNOSIS — Z8701 Personal history of pneumonia (recurrent): Secondary | ICD-10-CM

## 2013-10-01 DIAGNOSIS — L89109 Pressure ulcer of unspecified part of back, unspecified stage: Secondary | ICD-10-CM | POA: Diagnosis present

## 2013-10-01 DIAGNOSIS — I5032 Chronic diastolic (congestive) heart failure: Secondary | ICD-10-CM | POA: Diagnosis present

## 2013-10-01 DIAGNOSIS — I10 Essential (primary) hypertension: Secondary | ICD-10-CM | POA: Diagnosis present

## 2013-10-01 DIAGNOSIS — E876 Hypokalemia: Secondary | ICD-10-CM | POA: Diagnosis not present

## 2013-10-01 DIAGNOSIS — Z66 Do not resuscitate: Secondary | ICD-10-CM

## 2013-10-01 DIAGNOSIS — M0510 Rheumatoid lung disease with rheumatoid arthritis of unspecified site: Secondary | ICD-10-CM | POA: Diagnosis present

## 2013-10-01 DIAGNOSIS — E039 Hypothyroidism, unspecified: Secondary | ICD-10-CM | POA: Diagnosis present

## 2013-10-01 DIAGNOSIS — R918 Other nonspecific abnormal finding of lung field: Secondary | ICD-10-CM

## 2013-10-01 DIAGNOSIS — Z87891 Personal history of nicotine dependence: Secondary | ICD-10-CM

## 2013-10-01 DIAGNOSIS — R6520 Severe sepsis without septic shock: Secondary | ICD-10-CM

## 2013-10-01 DIAGNOSIS — L89309 Pressure ulcer of unspecified buttock, unspecified stage: Secondary | ICD-10-CM | POA: Diagnosis present

## 2013-10-01 DIAGNOSIS — Z887 Allergy status to serum and vaccine status: Secondary | ICD-10-CM

## 2013-10-01 DIAGNOSIS — E875 Hyperkalemia: Secondary | ICD-10-CM

## 2013-10-01 DIAGNOSIS — L8990 Pressure ulcer of unspecified site, unspecified stage: Secondary | ICD-10-CM

## 2013-10-01 DIAGNOSIS — D649 Anemia, unspecified: Secondary | ICD-10-CM | POA: Diagnosis present

## 2013-10-01 DIAGNOSIS — J8410 Pulmonary fibrosis, unspecified: Secondary | ICD-10-CM | POA: Diagnosis present

## 2013-10-01 DIAGNOSIS — Z79899 Other long term (current) drug therapy: Secondary | ICD-10-CM

## 2013-10-01 DIAGNOSIS — Z9981 Dependence on supplemental oxygen: Secondary | ICD-10-CM

## 2013-10-01 DIAGNOSIS — L89159 Pressure ulcer of sacral region, unspecified stage: Secondary | ICD-10-CM

## 2013-10-01 DIAGNOSIS — Z791 Long term (current) use of non-steroidal anti-inflammatories (NSAID): Secondary | ICD-10-CM

## 2013-10-01 DIAGNOSIS — I5033 Acute on chronic diastolic (congestive) heart failure: Secondary | ICD-10-CM

## 2013-10-01 DIAGNOSIS — A419 Sepsis, unspecified organism: Principal | ICD-10-CM

## 2013-10-01 DIAGNOSIS — N179 Acute kidney failure, unspecified: Secondary | ICD-10-CM

## 2013-10-01 DIAGNOSIS — R5383 Other fatigue: Secondary | ICD-10-CM

## 2013-10-01 DIAGNOSIS — M069 Rheumatoid arthritis, unspecified: Secondary | ICD-10-CM | POA: Diagnosis present

## 2013-10-01 DIAGNOSIS — R5381 Other malaise: Secondary | ICD-10-CM

## 2013-10-01 DIAGNOSIS — E785 Hyperlipidemia, unspecified: Secondary | ICD-10-CM | POA: Diagnosis present

## 2013-10-01 DIAGNOSIS — Z880 Allergy status to penicillin: Secondary | ICD-10-CM

## 2013-10-01 DIAGNOSIS — Z833 Family history of diabetes mellitus: Secondary | ICD-10-CM

## 2013-10-01 DIAGNOSIS — R627 Adult failure to thrive: Secondary | ICD-10-CM | POA: Diagnosis present

## 2013-10-01 DIAGNOSIS — IMO0002 Reserved for concepts with insufficient information to code with codable children: Secondary | ICD-10-CM

## 2013-10-01 DIAGNOSIS — J449 Chronic obstructive pulmonary disease, unspecified: Secondary | ICD-10-CM

## 2013-10-01 DIAGNOSIS — E1129 Type 2 diabetes mellitus with other diabetic kidney complication: Secondary | ICD-10-CM

## 2013-10-01 DIAGNOSIS — Z515 Encounter for palliative care: Secondary | ICD-10-CM

## 2013-10-01 DIAGNOSIS — Z6830 Body mass index (BMI) 30.0-30.9, adult: Secondary | ICD-10-CM

## 2013-10-01 DIAGNOSIS — E669 Obesity, unspecified: Secondary | ICD-10-CM

## 2013-10-01 DIAGNOSIS — E559 Vitamin D deficiency, unspecified: Secondary | ICD-10-CM | POA: Diagnosis present

## 2013-10-01 DIAGNOSIS — J4489 Other specified chronic obstructive pulmonary disease: Secondary | ICD-10-CM

## 2013-10-01 LAB — CBC WITH DIFFERENTIAL/PLATELET
BASOS ABS: 0 10*3/uL (ref 0.0–0.1)
BASOS PCT: 0 % (ref 0–1)
EOS ABS: 0 10*3/uL (ref 0.0–0.7)
Eosinophils Relative: 0 % (ref 0–5)
HCT: 36.8 % (ref 36.0–46.0)
HEMOGLOBIN: 11.3 g/dL — AB (ref 12.0–15.0)
Lymphocytes Relative: 2 % — ABNORMAL LOW (ref 12–46)
Lymphs Abs: 0.5 10*3/uL — ABNORMAL LOW (ref 0.7–4.0)
MCH: 24.2 pg — ABNORMAL LOW (ref 26.0–34.0)
MCHC: 30.7 g/dL (ref 30.0–36.0)
MCV: 78.8 fL (ref 78.0–100.0)
Monocytes Absolute: 0.9 10*3/uL (ref 0.1–1.0)
Monocytes Relative: 4 % (ref 3–12)
NEUTROS PCT: 94 % — AB (ref 43–77)
Neutro Abs: 22 10*3/uL — ABNORMAL HIGH (ref 1.7–7.7)
Platelets: 223 10*3/uL (ref 150–400)
RBC: 4.67 MIL/uL (ref 3.87–5.11)
RDW: 24.5 % — AB (ref 11.5–15.5)
WBC MORPHOLOGY: INCREASED
WBC: 23.4 10*3/uL — ABNORMAL HIGH (ref 4.0–10.5)

## 2013-10-01 LAB — BLOOD GAS, ARTERIAL
Acid-Base Excess: 1.3 mmol/L (ref 0.0–2.0)
BICARBONATE: 26 meq/L — AB (ref 20.0–24.0)
Drawn by: 331471
O2 Content: 4 L/min
O2 SAT: 94.4 %
Patient temperature: 98.6
TCO2: 23.7 mmol/L (ref 0–100)
pCO2 arterial: 44.1 mmHg (ref 35.0–45.0)
pH, Arterial: 7.389 (ref 7.350–7.450)
pO2, Arterial: 77.4 mmHg — ABNORMAL LOW (ref 80.0–100.0)

## 2013-10-01 LAB — URINALYSIS, ROUTINE W REFLEX MICROSCOPIC
Bilirubin Urine: NEGATIVE
Glucose, UA: NEGATIVE mg/dL
Hgb urine dipstick: NEGATIVE
KETONES UR: NEGATIVE mg/dL
LEUKOCYTES UA: NEGATIVE
Nitrite: NEGATIVE
PH: 5 (ref 5.0–8.0)
Protein, ur: NEGATIVE mg/dL
Specific Gravity, Urine: 1.01 (ref 1.005–1.030)
Urobilinogen, UA: 0.2 mg/dL (ref 0.0–1.0)

## 2013-10-01 LAB — COMPREHENSIVE METABOLIC PANEL
ALBUMIN: 2.6 g/dL — AB (ref 3.5–5.2)
ALT: 19 U/L (ref 0–35)
AST: 17 U/L (ref 0–37)
Alkaline Phosphatase: 128 U/L — ABNORMAL HIGH (ref 39–117)
BUN: 35 mg/dL — AB (ref 6–23)
CALCIUM: 10.6 mg/dL — AB (ref 8.4–10.5)
CO2: 26 mEq/L (ref 19–32)
CREATININE: 1.17 mg/dL — AB (ref 0.50–1.10)
Chloride: 94 mEq/L — ABNORMAL LOW (ref 96–112)
GFR calc Af Amer: 57 mL/min — ABNORMAL LOW (ref >90)
GFR calc non Af Amer: 49 mL/min — ABNORMAL LOW (ref >90)
Glucose, Bld: 185 mg/dL — ABNORMAL HIGH (ref 70–99)
Potassium: 5.9 mEq/L — ABNORMAL HIGH (ref 3.7–5.3)
Sodium: 135 mEq/L — ABNORMAL LOW (ref 137–147)
Total Bilirubin: 0.3 mg/dL (ref 0.3–1.2)
Total Protein: 7.1 g/dL (ref 6.0–8.3)

## 2013-10-01 LAB — MRSA PCR SCREENING: MRSA by PCR: POSITIVE — AB

## 2013-10-01 LAB — PRO B NATRIURETIC PEPTIDE: PRO B NATRI PEPTIDE: 1056 pg/mL — AB (ref 0–125)

## 2013-10-01 LAB — TROPONIN I: Troponin I: <0.3 ng/mL (ref <0.30)

## 2013-10-01 LAB — LIPASE, BLOOD: Lipase: 27 U/L (ref 11–59)

## 2013-10-01 LAB — I-STAT CG4 LACTIC ACID, ED: LACTIC ACID, VENOUS: 2.55 mmol/L — AB (ref 0.5–2.2)

## 2013-10-01 MED ORDER — HYDROCODONE-ACETAMINOPHEN 5-325 MG PO TABS
1.0000 | ORAL_TABLET | ORAL | Status: DC | PRN
  Administered 2013-10-03: 2 via ORAL
  Filled 2013-10-01: qty 2

## 2013-10-01 MED ORDER — SODIUM CHLORIDE 0.9 % IV BOLUS (SEPSIS)
250.0000 mL | Freq: Once | INTRAVENOUS | Status: AC
  Administered 2013-10-01: 250 mL via INTRAVENOUS

## 2013-10-01 MED ORDER — IPRATROPIUM-ALBUTEROL 0.5-2.5 (3) MG/3ML IN SOLN
3.0000 mL | Freq: Four times a day (QID) | RESPIRATORY_TRACT | Status: DC
  Administered 2013-10-02 – 2013-10-03 (×6): 3 mL via RESPIRATORY_TRACT
  Filled 2013-10-01 (×6): qty 3

## 2013-10-01 MED ORDER — MUPIROCIN 2 % EX OINT
1.0000 "application " | TOPICAL_OINTMENT | Freq: Two times a day (BID) | CUTANEOUS | Status: AC
  Administered 2013-10-02 – 2013-10-06 (×9): 1 via NASAL
  Filled 2013-10-01: qty 22

## 2013-10-01 MED ORDER — PANTOPRAZOLE SODIUM 40 MG PO TBEC
40.0000 mg | DELAYED_RELEASE_TABLET | Freq: Every day | ORAL | Status: DC
  Administered 2013-10-01 – 2013-10-04 (×4): 40 mg via ORAL
  Filled 2013-10-01 (×5): qty 1

## 2013-10-01 MED ORDER — NYSTATIN 100000 UNIT/GM EX CREA
1.0000 "application " | TOPICAL_CREAM | Freq: Two times a day (BID) | CUTANEOUS | Status: DC
  Administered 2013-10-01 – 2013-10-06 (×7): 1 via TOPICAL
  Filled 2013-10-01 (×2): qty 15

## 2013-10-01 MED ORDER — IPRATROPIUM BROMIDE 0.02 % IN SOLN
0.5000 mg | Freq: Four times a day (QID) | RESPIRATORY_TRACT | Status: DC
  Administered 2013-10-01: 0.5 mg via RESPIRATORY_TRACT
  Filled 2013-10-01: qty 2.5

## 2013-10-01 MED ORDER — IPRATROPIUM-ALBUTEROL 0.5-2.5 (3) MG/3ML IN SOLN: 3.0000 mL | Freq: Four times a day (QID) | RESPIRATORY_TRACT | Status: DC

## 2013-10-01 MED ORDER — LIP MEDEX EX OINT
1.0000 "application " | TOPICAL_OINTMENT | CUTANEOUS | Status: DC | PRN
  Administered 2013-10-06: 1 via TOPICAL
  Filled 2013-10-01: qty 7

## 2013-10-01 MED ORDER — IPRATROPIUM-ALBUTEROL 0.5-2.5 (3) MG/3ML IN SOLN
3.0000 mL | Freq: Once | RESPIRATORY_TRACT | Status: AC
  Administered 2013-10-01: 3 mL via RESPIRATORY_TRACT
  Filled 2013-10-01: qty 3

## 2013-10-01 MED ORDER — SODIUM CHLORIDE 0.9 % IV SOLN: INTRAVENOUS | Status: DC

## 2013-10-01 MED ORDER — LORATADINE 10 MG PO TABS
10.0000 mg | ORAL_TABLET | Freq: Every day | ORAL | Status: DC
  Administered 2013-10-02 – 2013-10-03 (×2): 10 mg via ORAL
  Filled 2013-10-01 (×2): qty 1

## 2013-10-01 MED ORDER — VANCOMYCIN HCL IN DEXTROSE 750-5 MG/150ML-% IV SOLN
750.0000 mg | Freq: Two times a day (BID) | INTRAVENOUS | Status: DC
  Administered 2013-10-02 – 2013-10-03 (×4): 750 mg via INTRAVENOUS
  Filled 2013-10-01 (×6): qty 150

## 2013-10-01 MED ORDER — LEVOTHYROXINE SODIUM 50 MCG PO TABS
50.0000 ug | ORAL_TABLET | Freq: Every day | ORAL | Status: DC
  Administered 2013-10-02: 50 ug via ORAL
  Filled 2013-10-01 (×5): qty 1

## 2013-10-01 MED ORDER — LEFLUNOMIDE 20 MG PO TABS
20.0000 mg | ORAL_TABLET | Freq: Every day | ORAL | Status: DC
  Administered 2013-10-02 – 2013-10-03 (×2): 20 mg via ORAL
  Filled 2013-10-01 (×3): qty 1

## 2013-10-01 MED ORDER — ENSURE COMPLETE PO LIQD
237.0000 mL | Freq: Two times a day (BID) | ORAL | Status: DC
  Filled 2013-10-01: qty 237

## 2013-10-01 MED ORDER — VANCOMYCIN HCL 10 G IV SOLR
2000.0000 mg | Freq: Once | INTRAVENOUS | Status: AC
  Administered 2013-10-01: 2000 mg via INTRAVENOUS
  Filled 2013-10-01: qty 2000

## 2013-10-01 MED ORDER — CHLORHEXIDINE GLUCONATE CLOTH 2 % EX PADS
6.0000 | MEDICATED_PAD | Freq: Every day | CUTANEOUS | Status: DC
  Administered 2013-10-02 – 2013-10-06 (×3): 6 via TOPICAL

## 2013-10-01 MED ORDER — PREDNISONE 20 MG PO TABS
30.0000 mg | ORAL_TABLET | Freq: Every day | ORAL | Status: DC
  Administered 2013-10-02 – 2013-10-03 (×2): 30 mg via ORAL
  Filled 2013-10-01 (×4): qty 1

## 2013-10-01 MED ORDER — VANCOMYCIN HCL IN DEXTROSE 1-5 GM/200ML-% IV SOLN: 1000.0000 mg | Freq: Once | INTRAVENOUS | Status: DC

## 2013-10-01 MED ORDER — SODIUM CHLORIDE 0.9 % IV SOLN
INTRAVENOUS | Status: DC
  Administered 2013-10-01: 17:00:00 via INTRAVENOUS

## 2013-10-01 MED ORDER — SODIUM CHLORIDE 0.9 % IJ SOLN
3.0000 mL | Freq: Two times a day (BID) | INTRAMUSCULAR | Status: DC
  Administered 2013-10-01 – 2013-10-05 (×8): 3 mL via INTRAVENOUS
  Administered 2013-10-06: 10 mL via INTRAVENOUS

## 2013-10-01 MED ORDER — SODIUM CHLORIDE 0.9 % IV SOLN
1.0000 g | Freq: Once | INTRAVENOUS | Status: AC
  Administered 2013-10-01: 1 g via INTRAVENOUS
  Filled 2013-10-01: qty 10

## 2013-10-01 MED ORDER — MUPIROCIN 2 % EX OINT: 1.0000 "application " | TOPICAL_OINTMENT | Freq: Two times a day (BID) | CUTANEOUS | Status: DC

## 2013-10-01 MED ORDER — SODIUM CHLORIDE 0.9 % IV SOLN
500.0000 mg | Freq: Three times a day (TID) | INTRAVENOUS | Status: DC
  Administered 2013-10-02 – 2013-10-04 (×7): 500 mg via INTRAVENOUS
  Filled 2013-10-01 (×10): qty 500

## 2013-10-01 MED ORDER — ALBUTEROL SULFATE (2.5 MG/3ML) 0.083% IN NEBU
2.5000 mg | INHALATION_SOLUTION | Freq: Four times a day (QID) | RESPIRATORY_TRACT | Status: DC
  Administered 2013-10-01: 2.5 mg via RESPIRATORY_TRACT
  Filled 2013-10-01: qty 3

## 2013-10-01 MED ORDER — SODIUM CHLORIDE 0.9 % IV SOLN
500.0000 mg | Freq: Once | INTRAVENOUS | Status: AC
  Administered 2013-10-01: 500 mg via INTRAVENOUS
  Filled 2013-10-01: qty 500

## 2013-10-01 MED ORDER — MOMETASONE FURO-FORMOTEROL FUM 200-5 MCG/ACT IN AERO
2.0000 | INHALATION_SPRAY | Freq: Two times a day (BID) | RESPIRATORY_TRACT | Status: DC
  Administered 2013-10-01 – 2013-10-03 (×4): 2 via RESPIRATORY_TRACT
  Filled 2013-10-01: qty 8.8

## 2013-10-01 MED ORDER — SODIUM CHLORIDE 0.9 % IV SOLN
INTRAVENOUS | Status: AC
  Administered 2013-10-02: 06:00:00 via INTRAVENOUS

## 2013-10-01 MED ORDER — HEPARIN SODIUM (PORCINE) 5000 UNIT/ML IJ SOLN
5000.0000 [IU] | Freq: Three times a day (TID) | INTRAMUSCULAR | Status: DC
  Administered 2013-10-01 – 2013-10-05 (×11): 5000 [IU] via SUBCUTANEOUS
  Filled 2013-10-01 (×15): qty 1

## 2013-10-01 MED ORDER — LEVOFLOXACIN IN D5W 750 MG/150ML IV SOLN: 750.0000 mg | Freq: Once | INTRAVENOUS | Status: DC

## 2013-10-01 MED ORDER — SODIUM CHLORIDE 0.9 % IV SOLN: 500.0000 mg | Freq: Once | INTRAVENOUS | Status: DC

## 2013-10-01 MED ORDER — DEXTROSE 5 % IV SOLN: 2.0000 g | Freq: Once | INTRAVENOUS | Status: DC

## 2013-10-01 NOTE — H&P (Signed)
[facility] History and Physical  Jacqueline Washington RDE:[id number] DOB: [birth year] DOA: 10/01/2013  Referring physician: EDP PCP: Nadean Corwin, MD   Chief Complaint: Weakness   HPI: Jacqueline Washington is a 63 y.o. 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  Patient was supposed to be followed with home hospice after discharge in March, but patient refused hospice care.  She presents to the ED with worsening generalized weakness for the past 2 weeks.  She is now so weak she cant get up out of bed to do anything, and over the past couple of days has developed a sacral decubitus as well.  Patient states that her breathing is unchanged (as I watch her struggle and breath 30+ times a min).  Her wet sounding cough has been present since discharge in March (family does confirm this).  Her only site of pain is her sacral decubits especially with movement.  Poor PO fluid intake over past 3 days per family.  Review of Systems: The remainder of the ROS is negative; however, im not sure how well i can trust this, as the patient seems to be very much in denial of how sick she is, apparently this is not new for her either, (see the [facility] 3/25 note for more insight into her thought process).  Past Medical History  Diagnosis Date  . Asthma   . Emphysema     02 dependent 3L/min  . Emphysema   . Pneumonia   . Hypothyroidism   . Diabetes mellitus, type II   . Obesity   . Diastolic heart failure 09/22/2012    Grade 1  . Emphysema   . Rheumatoid arthritis(714.0)   . Hyperlipidemia   . Hypertension   . Vitamin D deficiency    Past Surgical History  Procedure Laterality Date  . Tubal ligation    . Colonoscopy    . Video bronchoscopy N/A 11/18/2012    Procedure: VIDEO BRONCHOSCOPY WITH FLUORO;  Surgeon: Storm Frisk, MD;  Location: [facility];  Service: Cardiopulmonary;  Laterality: N/A;  . Eye  surgery  at age 63   Social History:  reports that she quit smoking about 6 years ago. Her smoking use included Cigarettes. She has a 129 pack-year smoking history. She has never used smokeless tobacco. She reports that she drinks alcohol. She reports that she does not use illicit drugs.  Allergies  Allergen Reactions  . Chantix [Varenicline] Other (See Comments)    Unknown " blurry vision"  . Hctz [Hydrochlorothiazide] Other (See Comments)    unknown  . Penicillins Rash  . Sulfonamide Derivatives Rash  . Tetanus Toxoid Other (See Comments)    Knot on arm    Family History  Problem Relation Age of Onset  . Asthma Mother   . Heart disease Mother   . Clotting disorder Mother   . Heart attack Mother   . Diabetes Mother   . Hypertension Mother   . Cirrhosis Mother   . Colon cancer Neg Hx   . Heart attack Father      Prior to Admission medications   Medication Sig Start Date End Date Taking? Authorizing Provider  albuterol (PROVENTIL) (2.5 MG/3ML) 0.083% nebulizer solution Take 3 mLs (2.5 mg total) by nebulization 4 (four) times daily. DX 496 06/23/13  Yes Storm Frisk, MD  cetirizine (ZYRTEC) 10 MG tablet Take 10 mg by mouth daily.   Yes Historical  Provider, MD  Diphenhyd-Hydrocort-Nystatin (FIRST-DUKES MOUTHWASH) SUSP 5 ml swish and swallow every 1-2 hours for blisters in mouth 09/28/13  Yes Lucky Cowboy, MD  feeding supplement, ENSURE COMPLETE, (ENSURE COMPLETE) LIQD Take 237 mLs by mouth 2 (two) times daily between meals. 08/16/13  Yes Meredeth Ide, MD  furosemide (LASIX) 40 MG tablet Take 40 mg by mouth daily.    Yes Historical Provider, MD  ipratropium (ATROVENT) 0.02 % nebulizer solution Take 2.5 ml by Nebulizer 4 x daily 09/14/13  Yes Lucky Cowboy, MD  leflunomide (ARAVA) 20 MG tablet Take 20 mg by mouth daily. 06/06/13  Yes Historical Provider, MD  levothyroxine (SYNTHROID, LEVOTHROID) 50 MCG tablet Take 50 mcg by mouth daily before breakfast.   Yes Historical Provider, MD   lip balm (CARMEX) ointment Apply 1 application topically as needed for lip care.   Yes Historical Provider, MD  mometasone-formoterol (DULERA) 200-5 MCG/ACT AERO Inhale 2 puffs into the lungs 2 (two) times daily. 09/16/13 09/17/14 Yes Melissa R Smith, PA-C  nystatin cream (MYCOSTATIN) Apply 1 application topically 2 (two) times daily.   Yes Historical Provider, MD  pantoprazole (PROTONIX) 40 MG tablet Take 1 tablet (40 mg total) by mouth daily. 04/19/13  Yes Simonne Martinet, NP  potassium chloride SA (K-DUR,KLOR-CON) 20 MEQ tablet Take 60 mEq by mouth daily. 10/26/12  Yes Laveda Norman, MD  predniSONE (DELTASONE) 20 MG tablet Take 1.5 tablets (30 mg total) by mouth daily with breakfast. 09/08/13  Yes Renae Fickle, MD   Physical Exam: Filed Vitals:   10/01/13 1434  BP: 156/94  Pulse: 140  Temp: 97.7 F (36.5 C)  Resp: 30    BP 156/94  Pulse 140  Temp(Src) 97.7 F (36.5 C) (Oral)  Resp 30  Ht 5\' 4"  (1.626 m)  Wt 86.183 kg (190 lb)  BMI 32.60 kg/m2  SpO2 94%  General Appearance:    Alert, oriented, respiratory distress, appears stated age  Head:    Normocephalic, atraumatic  Eyes:    PERRL, EOMI, sclera non-icteric        Nose:   Nares without drainage or epistaxis. Mucosa, turbinates normal  Throat:   Dry mucous membranes. Oropharynx without erythema or exudate.  Neck:   Supple. No carotid bruits.  No thyromegaly.  No lymphadenopathy.   Back:     No CVA tenderness, no spinal tenderness  Lungs:     Tachypnic, borderline respiratory distress with increased work of breathing, B rhonchi, wet sounding breath sounds and cough.  Chest wall:    No tenderness to palpitation  Heart:    Regular rate and rhythm without murmurs, gallops, rubs  Abdomen:     Soft, non-tender, nondistended, normal bowel sounds, no organomegaly  Genitalia:    deferred  Rectal:    deferred  Extremities:   No clubbing, cyanosis or edema.  Pulses:   2+ and symmetric all extremities  Skin:   Sacral decubitus in  place, stage 1.  Lymph nodes:   Cervical, supraclavicular, and axillary nodes normal  Neurologic:   CNII-XII intact. Normal strength, sensation and reflexes      throughout    Labs on Admission:  Basic Metabolic Panel:  Recent Labs Lab 10/01/13 1630  NA 135*  K 5.9*  CL 94*  CO2 26  GLUCOSE 185*  BUN 35*  CREATININE 1.17*  CALCIUM 10.6*   Liver Function Tests:  Recent Labs Lab 10/01/13 1630  AST 17  ALT 19  ALKPHOS 128*  BILITOT 0.3  PROT  7.1  ALBUMIN 2.6*    Recent Labs Lab 10/01/13 1630  LIPASE 27   No results found for this basename: AMMONIA,  in the last 168 hours CBC:  Recent Labs Lab 10/01/13 1630  WBC 23.4*  NEUTROABS 22.0*  HGB 11.3*  HCT 36.8  MCV 78.8  PLT 223   Cardiac Enzymes:  Recent Labs Lab 10/01/13 1630  TROPONINI <0.30    BNP (last 3 results)  Recent Labs  04/14/13 2300 08/10/13 1554 10/01/13 1630  PROBNP 301.6* 1099.0* 1056.0*   CBG: No results found for this basename: GLUCAP,  in the last 168 hours  Radiological Exams on Admission: Dg Chest 2 View  10/01/2013   CLINICAL DATA:  Shortness of breath. Asthma. Diabetes and hypertension. Rheumatoid arthritis.  EXAM: CHEST  2 VIEW  COMPARISON:  09/22/2013  FINDINGS: Bilateral upper lobe volume loss and pleural-parenchymal scarring is stable in appearance. Superior hilar retraction again seen bilaterally which is unchanged. No evidence of acute or superimposed infiltrate. No evidence of pleural effusion. Heart size remains normal. A right-sided internal jugular central venous catheter remains in stable position.  IMPRESSION: Stable upper lobe predominant pleural-parenchymal scarring. No acute findings identified.   Electronically Signed   By: Myles Rosenthal M.D.   On: 10/01/2013 16:39    EKG: Independently reviewed.  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 Severe sepsis - Leukocytosis of 23k, this is not due to just prednisone demargination as evidenced by the marked left shift with > 20% bands reported in the differential.  Putting patient on empiric Primaxin and vancomycin.  Potential sources include but not limited to: 1. Recurrent HCAP: no acute finding on CXR, but may see this develop as we rehydrate her, certainly she has had enough HCAP admits in the past year that it wouldn't be surprising to see another one. 2. Sacral decubitus - is tender, but very minimal surrounding erythema, no crepitus, no pain beyond wound site to suggest necrotizing fasciitis.  Wound care consult ordered. 3. PICC line - no erythema or pain at line site, 1 peripheral and 1 line culture are pending at this time. 4. Negative UA, and no meningismus or headache to suggest meningitis, no abdominal symptoms or findings to suggest abdominal process. 2. AKI - pre-renal azotemia, hydrating patient, strict intake and output. 3. Acute on chronic resp failure - pulmonary to eval and order breathing treatments 4. Hyperkalemia - due to PO potasium intake at home and AKI, got temporary measures in ED, will monitor Q4H potassium draws.    Code Status: DNR  Family Communication: Husband at bedside Disposition Plan: Admit to SDU   Time spent: 101 min  Hillary Bow [facility] Pager [phone]  If 7AM-7PM, please contact the day team taking care of the patient [email or web address] Password [facility] 10/01/2013, 8:09 PM

## 2013-10-01 NOTE — Progress Notes (Addendum)
ANTIBIOTIC CONSULT NOTE - INITIAL  Pharmacy Consult for Vancomycin, Primaxin Indication: rule out sepsis  Allergies  Allergen Reactions  . Chantix [Varenicline] Other (See Comments)    Unknown " blurry vision"  . Hctz [Hydrochlorothiazide] Other (See Comments)    unknown  . Penicillins Rash  . Sulfonamide Derivatives Rash  . Tetanus Toxoid Other (See Comments)    Knot on arm    Patient Measurements: Height: 5\' 4"  (162.6 cm) Weight: 190 lb (86.183 kg) IBW/kg (Calculated) : 54.7 Last documented weight 88.9kg (09/08/13)  Vital Signs: Temp: 97.7 F (36.5 C) (04/18 1434) Temp src: Oral (04/18 1434) BP: 156/94 mmHg (04/18 1434) Pulse Rate: 140 (04/18 1434) Intake/Output from previous day:    Labs:  Recent Labs  10/01/13 1630  WBC 23.4*  HGB 11.3*  PLT 223  CREATININE 1.17*   Estimated Creatinine Clearance: 53 ml/min (by C-G formula based on Cr of 1.17).  Microbiology: No results found for this or any previous visit (from the past 720 hour(s)).  Medical History: Past Medical History  Diagnosis Date  . Asthma   . Emphysema     02 dependent 3L/min  . Emphysema   . Pneumonia   . Hypothyroidism   . Diabetes mellitus, type II   . Obesity   . Diastolic heart failure 09/22/2012    Grade 1  . Emphysema   . Rheumatoid arthritis(714.0)   . Hyperlipidemia   . Hypertension   . Vitamin D deficiency     Medications:  Anti-infectives   Start     Dose/Rate Route Frequency Ordered Stop   10/01/13 1945  levofloxacin (LEVAQUIN) IVPB 750 mg     750 mg 100 mL/hr over 90 Minutes Intravenous  Once 10/01/13 1937     10/01/13 1945  aztreonam (AZACTAM) 2 g in dextrose 5 % 50 mL IVPB     2 g 100 mL/hr over 30 Minutes Intravenous  Once 10/01/13 1937     10/01/13 1945  vancomycin (VANCOCIN) IVPB 1000 mg/200 mL premix     1,000 mg 200 mL/hr over 60 Minutes Intravenous  Once 10/01/13 1937       Assessment: Jacqueline Washington admitted 4/18 with increasing weakness and concern for sepsis.   She was recently admitted (3/16 - 3/26) for HCAP, acute on chronic DCHF.  CXR without acute disease, decubitus ulcers without cellulitis or abscess.  Pharmacy is consulted to dose vancomycin and Primaxin.  Tmax: 97.7  WBCs: 23.4  Renal: SCr 1.17, CrCl ~ 53 ml/min  Lactic acid: 2.55  Blood cultures have been sent   Goal of Therapy:  Vancomycin trough level 15-20 mcg/ml Appropriate abx dosing, eradication of infection.   Plan:   Primaxin 500mg  IV q8h  Vancomycin 2g IV x1, then 750mg  IV q12h.  Measure Vanc trough at steady state.  Follow up renal fxn and culture results.  03-12-1984 PharmD, BCPS Pager [phone] 10/01/2013 8:16 PM

## 2013-10-01 NOTE — ED Notes (Addendum)
Per EMS, pt has felt increasingly weak for the past 2 weeks. Pt has had pneumonia for 1 year and has felt intermittently weak since being diagnosed. Pt is on 3-4L O2 at home. PICC on right side. Pt was discharged from [facility] on 3/26. Pt family is concerned about bed sores on pt's buttocks.

## 2013-10-01 NOTE — ED Provider Notes (Signed)
CSN: [id number]     Arrival date & time 10/01/13  1432 History   First MD Initiated Contact with Patient 10/01/13 1513     Chief Complaint  Patient presents with  . Weakness     (Consider location/radiation/quality/duration/timing/severity/associated sxs/prior Treatment) The history is provided by the patient, a relative and the spouse.   63 year old female brought in by family. Patient was discharged from [facility] on March 26. Patient has a history of recurrent pneumonias has bad COPD. Followed by Dr. Delford Field from pulmonary medicine. Brought in by family for generalized weakness patient states that she's just too weak to stand and family not able to get her out of bed at home by themselves. The patient was supposed to be followed by hospice after the discharge in March but the family claims that they would take her but more likely patient refused to have hospice take care of. There was a delay in home health care. Home nurse was out for preliminary visit this week. Currently no scheduled home health care but it was supposed to be just twice a week. Patient states that her breathing is unchanged patient frequent visits is tachycardic and tachypnea. Patient denies fevers. Family states this is new decubitus on the buttocks area. Patient also has not been not drinking very many fluids for the past 3 days. Patient is of course been noncompliant with her meds. Family is not sure the last time she had an albuterol. As stated patient denies any newer worse breathing.  Past Medical History  Diagnosis Date  . Asthma   . Emphysema     02 dependent 3L/min  . Emphysema   . Pneumonia   . Hypothyroidism   . Diabetes mellitus, type II   . Obesity   . Diastolic heart failure 09/22/2012    Grade 1  . Emphysema   . Rheumatoid arthritis(714.0)   . Hyperlipidemia   . Hypertension   . Vitamin D deficiency    Past Surgical History  Procedure Laterality Date  . Tubal ligation    . Colonoscopy    . Video  bronchoscopy N/A 11/18/2012    Procedure: VIDEO BRONCHOSCOPY WITH FLUORO;  Surgeon: Storm Frisk, MD;  Location: [facility];  Service: Cardiopulmonary;  Laterality: N/A;  . Eye surgery  at age 71   Family History  Problem Relation Age of Onset  . Asthma Mother   . Heart disease Mother   . Clotting disorder Mother   . Heart attack Mother   . Diabetes Mother   . Hypertension Mother   . Cirrhosis Mother   . Colon cancer Neg Hx   . Heart attack Father    History  Substance Use Topics  . Smoking status: Former Smoker -- 3.00 packs/day for 43 years    Types: Cigarettes    Quit date: 04/24/2007  . Smokeless tobacco: Never Used  . Alcohol Use: Yes     Comment: occasional   OB History   Grav Para Term Preterm Abortions TAB SAB Ect Mult Living                 Review of Systems  Constitutional: Negative for fever and chills.  HENT: Positive for congestion.   Eyes: Negative for visual disturbance.  Respiratory: Positive for shortness of breath.   Cardiovascular: Negative for chest pain.  Gastrointestinal: Negative for nausea, vomiting and abdominal pain.  Genitourinary: Negative for dysuria.  Musculoskeletal: Negative for back pain.  Skin: Negative for rash.  Neurological: Negative for  headaches.  Hematological: Does not bruise/bleed easily.  Psychiatric/Behavioral: Negative for confusion.      Allergies  Chantix; Hctz; Penicillins; Sulfonamide derivatives; and Tetanus toxoid  Home Medications   Prior to Admission medications   Medication Sig Start Date End Date Taking? Authorizing Provider  albuterol (PROVENTIL) (2.5 MG/3ML) 0.083% nebulizer solution Take 3 mLs (2.5 mg total) by nebulization 4 (four) times daily. DX 496 06/23/13  Yes Storm FriskPatrick E Wright, MD  cetirizine (ZYRTEC) 10 MG tablet Take 10 mg by mouth daily.   Yes Historical Provider, MD  Diphenhyd-Hydrocort-Nystatin (FIRST-DUKES MOUTHWASH) SUSP 5 ml swish and swallow every 1-2 hours for blisters in mouth 09/28/13   Yes Lucky CowboyWilliam McKeown, MD  feeding supplement, ENSURE COMPLETE, (ENSURE COMPLETE) LIQD Take 237 mLs by mouth 2 (two) times daily between meals. 08/16/13  Yes Meredeth IdeGagan S Lama, MD  furosemide (LASIX) 40 MG tablet Take 40 mg by mouth daily.    Yes Historical Provider, MD  ipratropium (ATROVENT) 0.02 % nebulizer solution Take 2.5 ml by Nebulizer 4 x daily 09/14/13  Yes Lucky CowboyWilliam McKeown, MD  leflunomide (ARAVA) 20 MG tablet Take 20 mg by mouth daily. 06/06/13  Yes Historical Provider, MD  levothyroxine (SYNTHROID, LEVOTHROID) 50 MCG tablet Take 50 mcg by mouth daily before breakfast.   Yes Historical Provider, MD  lip balm (CARMEX) ointment Apply 1 application topically as needed for lip care.   Yes Historical Provider, MD  mometasone-formoterol (DULERA) 200-5 MCG/ACT AERO Inhale 2 puffs into the lungs 2 (two) times daily. 09/16/13 09/17/14 Yes Melissa R Smith, PA-C  nystatin cream (MYCOSTATIN) Apply 1 application topically 2 (two) times daily.   Yes Historical Provider, MD  pantoprazole (PROTONIX) 40 MG tablet Take 1 tablet (40 mg total) by mouth daily. 04/19/13  Yes Simonne MartinetPeter E Babcock, NP  potassium chloride SA (K-DUR,KLOR-CON) 20 MEQ tablet Take 60 mEq by mouth daily. 10/26/12  Yes Laveda Normanhris N Oti, MD  predniSONE (DELTASONE) 20 MG tablet Take 1.5 tablets (30 mg total) by mouth daily with breakfast. 09/08/13  Yes Renae FickleMackenzie Short, MD   BP 156/94  Pulse 140  Temp(Src) 97.7 F (36.5 C) (Oral)  Resp 30  SpO2 94% Physical Exam  Nursing note and vitals reviewed. Constitutional: She is oriented to person, place, and time. She appears well-developed and well-nourished. No distress.  HENT:  Head: Normocephalic and atraumatic.  Next membranes dry.  Eyes: Conjunctivae and EOM are normal. Pupils are equal, round, and reactive to light.  Neck: Normal range of motion.  Cardiovascular: Normal rate, regular rhythm and normal heart sounds.   Pulmonary/Chest: She has wheezes.  Bilateral rhonchi. Patient with increased breathing  rate.  Abdominal: Soft. There is no tenderness.  Musculoskeletal: Normal range of motion.  Buttocks area with a each cheek with a 2 x 5 cm area of fresh skin peeling decubitus no crepitance some surrounding erythema but no widespread cellulitis. No palpable abscess.  Neurological: She is alert and oriented to person, place, and time. No cranial nerve deficit. She exhibits normal muscle tone. Coordination normal.  Skin: Skin is warm. No rash noted.    ED Course  Procedures (including critical care time) Labs Review Labs Reviewed  PRO B NATRIURETIC PEPTIDE - Abnormal; Notable for the following:    Pro B Natriuretic peptide (BNP) 1056.0 (*)    All other components within normal limits  COMPREHENSIVE METABOLIC PANEL - Abnormal; Notable for the following:    Sodium 135 (*)    Potassium 5.9 (*)    Chloride 94 (*)  Glucose, Bld 185 (*)    BUN 35 (*)    Creatinine, Ser 1.17 (*)    Calcium 10.6 (*)    Albumin 2.6 (*)    Alkaline Phosphatase 128 (*)    GFR calc non Af Amer 49 (*)    GFR calc Af Amer 57 (*)    All other components within normal limits  URINALYSIS, ROUTINE W REFLEX MICROSCOPIC - Abnormal; Notable for the following:    APPearance CLOUDY (*)    All other components within normal limits  CBC WITH DIFFERENTIAL - Abnormal; Notable for the following:    WBC 23.4 (*)    Hemoglobin 11.3 (*)    MCH 24.2 (*)    RDW 24.5 (*)    Neutrophils Relative % 94 (*)    Lymphocytes Relative 2 (*)    Neutro Abs 22.0 (*)    Lymphs Abs 0.5 (*)    All other components within normal limits  BLOOD GAS, ARTERIAL - Abnormal; Notable for the following:    pO2, Arterial 77.4 (*)    Bicarbonate 26.0 (*)    All other components within normal limits  I-STAT CG4 LACTIC ACID, ED - Abnormal; Notable for the following:    Lactic Acid, Venous 2.55 (*)    All other components within normal limits  CULTURE, BLOOD (ROUTINE X 2)  CULTURE, BLOOD (ROUTINE X 2)  LIPASE, BLOOD  TROPONIN I   Results for  orders placed during the hospital encounter of 10/01/13  PRO B NATRIURETIC PEPTIDE      Result Value Ref Range   Pro B Natriuretic peptide (BNP) 1056.0 (*) 0 - 125 pg/mL  COMPREHENSIVE METABOLIC PANEL      Result Value Ref Range   Sodium 135 (*) 137 - 147 mEq/L   Potassium 5.9 (*) 3.7 - 5.3 mEq/L   Chloride 94 (*) 96 - 112 mEq/L   CO2 26  19 - 32 mEq/L   Glucose, Bld 185 (*) 70 - 99 mg/dL   BUN 35 (*) 6 - 23 mg/dL   Creatinine, Ser 7.91 (*) 0.50 - 1.10 mg/dL   Calcium 50.5 (*) 8.4 - 10.5 mg/dL   Total Protein 7.1  6.0 - 8.3 g/dL   Albumin 2.6 (*) 3.5 - 5.2 g/dL   AST 17  0 - 37 U/L   ALT 19  0 - 35 U/L   Alkaline Phosphatase 128 (*) 39 - 117 U/L   Total Bilirubin 0.3  0.3 - 1.2 mg/dL   GFR calc non Af Amer 49 (*) >90 mL/min   GFR calc Af Amer 57 (*) >90 mL/min  LIPASE, BLOOD      Result Value Ref Range   Lipase 27  11 - 59 U/L  URINALYSIS, ROUTINE W REFLEX MICROSCOPIC      Result Value Ref Range   Color, Urine YELLOW  YELLOW   APPearance CLOUDY (*) CLEAR   Specific Gravity, Urine 1.010  1.005 - 1.030   pH 5.0  5.0 - 8.0   Glucose, UA NEGATIVE  NEGATIVE mg/dL   Hgb urine dipstick NEGATIVE  NEGATIVE   Bilirubin Urine NEGATIVE  NEGATIVE   Ketones, ur NEGATIVE  NEGATIVE mg/dL   Protein, ur NEGATIVE  NEGATIVE mg/dL   Urobilinogen, UA 0.2  0.0 - 1.0 mg/dL   Nitrite NEGATIVE  NEGATIVE   Leukocytes, UA NEGATIVE  NEGATIVE  CBC WITH DIFFERENTIAL      Result Value Ref Range   WBC 23.4 (*) 4.0 - 10.5 K/uL  RBC 4.67  3.87 - 5.11 MIL/uL   Hemoglobin 11.3 (*) 12.0 - 15.0 g/dL   HCT 78.9  38.1 - 01.7 %   MCV 78.8  78.0 - 100.0 fL   MCH 24.2 (*) 26.0 - 34.0 pg   MCHC 30.7  30.0 - 36.0 g/dL   RDW 51.0 (*) 25.8 - 52.7 %   Platelets 223  150 - 400 K/uL   Neutrophils Relative % 94 (*) 43 - 77 %   Lymphocytes Relative 2 (*) 12 - 46 %   Monocytes Relative 4  3 - 12 %   Eosinophils Relative 0  0 - 5 %   Basophils Relative 0  0 - 1 %   Neutro Abs 22.0 (*) 1.7 - 7.7 K/uL   Lymphs Abs  0.5 (*) 0.7 - 4.0 K/uL   Monocytes Absolute 0.9  0.1 - 1.0 K/uL   Eosinophils Absolute 0.0  0.0 - 0.7 K/uL   Basophils Absolute 0.0  0.0 - 0.1 K/uL   RBC Morphology RARE NRBCs     WBC Morphology INCREASED BANDS (>20% BANDS)     Smear Review PLATELET CLUMPS NOTED ON SMEAR    TROPONIN I      Result Value Ref Range   Troponin I <0.30  <0.30 ng/mL  BLOOD GAS, ARTERIAL      Result Value Ref Range   O2 Content 4.0     Delivery systems NASAL CANNULA     pH, Arterial 7.389  7.350 - 7.450   pCO2 arterial 44.1  35.0 - 45.0 mmHg   pO2, Arterial 77.4 (*) 80.0 - 100.0 mmHg   Bicarbonate 26.0 (*) 20.0 - 24.0 mEq/L   TCO2 23.7  0 - 100 mmol/L   Acid-Base Excess 1.3  0.0 - 2.0 mmol/L   O2 Saturation 94.4     Patient temperature 98.6     Collection site RIGHT RADIAL     Drawn by [id number]     Sample type ARTERIAL DRAW     Allens test (pass/fail) PASS  PASS  I-STAT CG4 LACTIC ACID, ED      Result Value Ref Range   Lactic Acid, Venous 2.55 (*) 0.5 - 2.2 mmol/L     Imaging Review Dg Chest 2 View  10/01/2013   CLINICAL DATA:  Shortness of breath. Asthma. Diabetes and hypertension. Rheumatoid arthritis.  EXAM: CHEST  2 VIEW  COMPARISON:  09/22/2013  FINDINGS: Bilateral upper lobe volume loss and pleural-parenchymal scarring is stable in appearance. Superior hilar retraction again seen bilaterally which is unchanged. No evidence of acute or superimposed infiltrate. No evidence of pleural effusion. Heart size remains normal. A right-sided internal jugular central venous catheter remains in stable position.  IMPRESSION: Stable upper lobe predominant pleural-parenchymal scarring. No acute findings identified.   Electronically Signed   By: Myles Rosenthal M.D.   On: 10/01/2013 16:39     EKG Interpretation   Date/Time:  Saturday October 01 2013 16:00:35 EDT Ventricular Rate:  134 PR Interval:  134 QRS Duration: 68 QT Interval:  258 QTC Calculation: 385 R Axis:   64 Text Interpretation:  Sinus tachycardia  LAE, consider biatrial enlargement  Borderline T wave abnormalities No significant change since last tracing  Confirmed by Makhai Fulco  MD, Andreana Klingerman [phone]) on 10/01/2013 4:05:13 PM Also  confirmed by Deretha Emory  MD, Benisha Hadaway [phone])  on 10/01/2013 5:54:26 PM       CRITICAL CARE Performed by: Shelda Jakes Total critical care time: 30 Critical care time was exclusive  of separately billable procedures and treating other patients. Critical care was necessary to treat or prevent imminent or life-threatening deterioration. Critical care was time spent personally by me on the following activities: development of treatment plan with patient and/or surrogate as well as nursing, discussions with consultants, evaluation of patient's response to treatment, examination of patient, obtaining history from patient or surrogate, ordering and performing treatments and interventions, ordering and review of laboratory studies, ordering and review of radiographic studies, pulse oximetry and re-evaluation of patient's condition.   MDM   Final diagnoses:  Weakness  Decubitus skin ulcer  COPD (chronic obstructive pulmonary disease)  Hyperkalemia    The patient's chest x-ray here today without evidence of pneumonia. However she has a marked leukocytosis on that could be due to steroids. Patient states that her breathing is baseline but she is short of breath here in the emergency department on her oxygen. Arterial blood gas with the marginal PO2 at 70 but pH and PCO2 look good. Patient also with hyperkalemia EKG though shows no evidence of any peak T waves or QRS widening. Possible this could be mild component of hemolysis. However will treat with some calcium gluconate in the meantime. In addition patient is receiving duo nebs here the albuterol will help lower her potassium as well. In addition her creatinine is fairly normal at 1.17.  Blood cultures have been obtained. Patient also has some worsening decubitus  ulcers. Most likely patient will require admission to his family says she's too weak to take care of at home.  In addition patient may be pre-septic or septic. Most likely source would be the lungs and she's had repeated infections there've a chest x-ray negative here today. Additional source could be he declined. Once the cultures was drawn to the PICC line and one peripheral set of cultures done. Will start septic broad-spectrum antibiotics as per septic protocol. Patient is not currently hypotensive but has been persistently tachycardic also not febrile. I guess it is less than 4. However will treat him carefully to be on the safe side. Patient will require admission to step down.  Shelda Jakes, MD 10/01/13 2003

## 2013-10-02 DIAGNOSIS — L89159 Pressure ulcer of sacral region, unspecified stage: Secondary | ICD-10-CM | POA: Diagnosis present

## 2013-10-02 LAB — BASIC METABOLIC PANEL
BUN: 26 mg/dL — ABNORMAL HIGH (ref 6–23)
CO2: 27 mEq/L (ref 19–32)
Calcium: 9 mg/dL (ref 8.4–10.5)
Chloride: 102 mEq/L (ref 96–112)
Creatinine, Ser: 0.84 mg/dL (ref 0.50–1.10)
GFR calc non Af Amer: 73 mL/min — ABNORMAL LOW (ref >90)
GFR, EST AFRICAN AMERICAN: 85 mL/min — AB (ref >90)
Glucose, Bld: 103 mg/dL — ABNORMAL HIGH (ref 70–99)
POTASSIUM: 3.8 meq/L (ref 3.7–5.3)
Sodium: 141 mEq/L (ref 137–147)

## 2013-10-02 LAB — GLUCOSE, CAPILLARY
GLUCOSE-CAPILLARY: 108 mg/dL — AB (ref 70–99)
Glucose-Capillary: 101 mg/dL — ABNORMAL HIGH (ref 70–99)
Glucose-Capillary: 91 mg/dL (ref 70–99)
Glucose-Capillary: 96 mg/dL (ref 70–99)
Glucose-Capillary: 127 mg/dL — ABNORMAL HIGH (ref 70–99)
Glucose-Capillary: 143 mg/dL — ABNORMAL HIGH (ref 70–99)

## 2013-10-02 LAB — CBC WITH DIFFERENTIAL/PLATELET
Basophils Absolute: 0 10*3/uL (ref 0.0–0.1)
Basophils Relative: 0 % (ref 0–1)
EOS PCT: 0 % (ref 0–5)
Eosinophils Absolute: 0 10*3/uL (ref 0.0–0.7)
HCT: 29.9 % — ABNORMAL LOW (ref 36.0–46.0)
Hemoglobin: 9.1 g/dL — ABNORMAL LOW (ref 12.0–15.0)
Lymphocytes Relative: 4 % — ABNORMAL LOW (ref 12–46)
Lymphs Abs: 0.6 10*3/uL — ABNORMAL LOW (ref 0.7–4.0)
MCH: 24.1 pg — ABNORMAL LOW (ref 26.0–34.0)
MCHC: 30.4 g/dL (ref 30.0–36.0)
MCV: 79.3 fL (ref 78.0–100.0)
Monocytes Absolute: 1 10*3/uL (ref 0.1–1.0)
Monocytes Relative: 6 % (ref 3–12)
NEUTROS PCT: 90 % — AB (ref 43–77)
Neutro Abs: 14.3 10*3/uL — ABNORMAL HIGH (ref 1.7–7.7)
PLATELETS: 149 10*3/uL — AB (ref 150–400)
RBC: 3.77 MIL/uL — ABNORMAL LOW (ref 3.87–5.11)
RDW: 24.7 % — ABNORMAL HIGH (ref 11.5–15.5)
WBC: 15.9 10*3/uL — AB (ref 4.0–10.5)

## 2013-10-02 LAB — POTASSIUM
POTASSIUM: 3.9 meq/L (ref 3.7–5.3)
Potassium: 3.3 mEq/L — ABNORMAL LOW (ref 3.7–5.3)

## 2013-10-02 MED ORDER — INSULIN ASPART 100 UNIT/ML ~~LOC~~ SOLN: 0.0000 [IU] | Freq: Three times a day (TID) | SUBCUTANEOUS | Status: DC

## 2013-10-02 MED ORDER — CIPROFLOXACIN IN D5W 400 MG/200ML IV SOLN
400.0000 mg | Freq: Two times a day (BID) | INTRAVENOUS | Status: DC
  Administered 2013-10-02 – 2013-10-05 (×8): 400 mg via INTRAVENOUS
  Filled 2013-10-02 (×10): qty 200

## 2013-10-02 NOTE — Progress Notes (Signed)
[facility] PROGRESS NOTE  Jacqueline Washington HCW:[id number] DOB: 08-14-1950 DOA: 10/01/2013 PCP: Nadean Corwin, MD  Interim Summary I have seen and examined patient at bedside and reviewed her chart. Jacqueline Washington is a 63 y.o. 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 Patient was found to be septic with WBC 23,000, and was in acute renal failure. She presented to the ED with worsening generalized weakness for the past 2 weeks to the extent where she can't get up out of bed to do anything. She has also developed a sacral decubitus over the past few days. She had Poor PO fluid intake for 3 days prior to admission, per her family. Patient was supposed to be followed by home hospice after discharge in March, but she refused hospice care. Today she explains that she is just afraid of the idea of hospice. She would rather continue working with advised home care. Would therefore defer her to home without services or cons either sneaks once medically stable. She is clinically better today off her placement on broad-spectrum antibiotics and IV fluids, with a white count improving 15,900. The source of sepsis is not clear at this point and as her lungs don't seem significantly changed from the last hospitalization. One potential source is the PICC line in the right upper chest. Await blood cultures, in the meanwhile, will continue broad spectrum on the [place] would double gram-negative coverage given patient's multiple recent admissions. She remains DO NOT RESUSCITATE. Plan Severe sepsis with acute organ dysfunction  Follow septic workup  Broad-spectrum antibiotics including Vancomycin/Imipenem/Ciprofloxacin to complete 7-10 days.  IV fluids Acute and chronic respiratory failure/Copd Gold C   Continue bronchodilators/oxygen supplementation/systemic steroids  Follow pulmonary recommendations AKI (acute  kidney injury)/Hyperkalemia  Improving with rehydration  Monitor.  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 Sacral decubitus ulcer  Supportive care per wound care DVT/GI prophylaxis  Heparin subcutaneous  PPI Code Status: DO NOT RESUSCITATE Family Communication: Spoke with patient's daughter at the bedside Disposition Plan: Keep in the ICU today as she remains septic  Patient's condition is critical. Time spent critical care 35 minutes.  Consultants:  Wound care  Pulmonary  Procedures:  None  Antibiotics:  Vancomycin/imipenem 10/01/13> present   Ciprofloxacin 10/02/13>  HPI/Subjective: Feels better, a little stronger than she did yesterday.  Objective: Filed Vitals:   10/02/13 0400  BP: 119/63  Pulse: 115  Temp: 98.6 F (37 C)  Resp: 15    Intake/Output Summary (Last 24 hours) at 10/02/13 0803 Last data filed at 10/02/13 1761  Gross per 24 hour  Intake   1625 ml  Output    700 ml  Net    925 ml   Filed Weights   10/01/13 1957 10/02/13 0400  Weight: 86.183 kg (190 lb) 80.5 kg (177 lb 7.5 oz)    Exam:   General:  Somewhat dyspneic with a wet cough  Cardiovascular: Tachycardic. S1S2 normal. No murmurs.  Respiratory: Bilateral air entry. PICC line right upper chest. No rhonchi.  Abdomen: Soft and nontender. Positive bowel sounds  Musculoskeletal: No pedal edema  Data Reviewed: Basic Metabolic Panel:  Recent Labs Lab 10/01/13 1630 10/01/13 2315 10/02/13 0355 10/02/13 0710  NA 135*  --  141  --   K 5.9* 3.9 3.8 3.3*  CL 94*  --  102  --   CO2 26  --  27  --  GLUCOSE 185*  --  103*  --   BUN 35*  --  26*  --   CREATININE 1.17*  --  0.84  --   CALCIUM 10.6*  --  9.0  --    Liver Function Tests:  Recent Labs Lab 10/01/13 1630  AST 17  ALT 19  ALKPHOS 128*  BILITOT 0.3  PROT 7.1  ALBUMIN 2.6*    Recent Labs Lab 10/01/13 1630  LIPASE 27   No results found for this basename:  AMMONIA,  in the last 168 hours CBC:  Recent Labs Lab 10/01/13 1630 10/02/13 0355  WBC 23.4* 15.9*  NEUTROABS 22.0* 14.3*  HGB 11.3* 9.1*  HCT 36.8 29.9*  MCV 78.8 79.3  PLT 223 149*   Cardiac Enzymes:  Recent Labs Lab 10/01/13 1630  TROPONINI <0.30   BNP (last 3 results)  Recent Labs  04/14/13 2300 08/10/13 1554 10/01/13 1630  PROBNP 301.6* 1099.0* 1056.0*   CBG:  Recent Labs Lab 10/02/13 0058 10/02/13 0329  GLUCAP 101* 108*    Recent Results (from the past 240 hour(s))  MRSA PCR SCREENING     Status: Abnormal   Collection Time    10/01/13  8:59 PM      Result Value Ref Range Status   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 It is not     intended to diagnose MRSA     infection nor to guide or     monitor treatment for     MRSA infections.     RESULT CALLED TO, READ BACK BY AND VERIFIED WITH:     GARLAND,G/2W @2235  ON 10/01/13 BY KARCZEWSKI,S.     Studies: Dg Chest 2 View  10/01/2013   CLINICAL DATA:  Shortness of breath. Asthma. Diabetes and hypertension. Rheumatoid arthritis.  EXAM: CHEST  2 VIEW  COMPARISON:  09/22/2013  FINDINGS: Bilateral upper lobe volume loss and pleural-parenchymal scarring is stable in appearance. Superior hilar retraction again seen bilaterally which is unchanged. No evidence of acute or superimposed infiltrate. No evidence of pleural effusion. Heart size remains normal. A right-sided internal jugular central venous catheter remains in stable position.  IMPRESSION: Stable upper lobe predominant pleural-parenchymal scarring. No acute findings identified.   Electronically Signed   By: 11/22/2013 M.D.   On: 10/01/2013 16:39    Scheduled Meds: . Chlorhexidine Gluconate Cloth  6 each Topical Q0600  . ciprofloxacin  400 mg Intravenous Q12H  . feeding  supplement (ENSURE COMPLETE)  237 mL Oral BID BM  . heparin  5,000 Units Subcutaneous 3 times per day  . imipenem-cilastatin  500 mg Intravenous 3 times per day  . ipratropium-albuterol  3 mL Nebulization QID  . leflunomide  20 mg Oral Daily  . levothyroxine  50 mcg Oral QAC breakfast  . loratadine  10 mg Oral Daily  . mometasone-formoterol  2 puff Inhalation BID  . mupirocin ointment  1 application Nasal BID  . nystatin cream  1 application Topical BID  . pantoprazole  40 mg Oral Daily  . predniSONE  30 mg Oral Q breakfast  . sodium chloride  3 mL Intravenous Q12H  . vancomycin  750 mg Intravenous Q12H   Continuous Infusions: .  sodium chloride 125 mL/hr at 10/02/13 0622      Fate Caster  [facility] Pager [phone]. If 7PM-7AM, please contact night-coverage at [email or web address], password [facility] 10/02/2013, 8:03 AM  LOS: 1 day

## 2013-10-02 NOTE — Progress Notes (Signed)
Patient refuses to turn and reposition self throughout the sift on Sunday, 10/02/2013.  Patient also very congested, and refuses to be suctioned per nurse.  Continue to monitor patient and encourage patient to reposition and cough.  Senna Lape Debroah Loop RN

## 2013-10-03 DIAGNOSIS — E43 Unspecified severe protein-calorie malnutrition: Secondary | ICD-10-CM

## 2013-10-03 DIAGNOSIS — J189 Pneumonia, unspecified organism: Secondary | ICD-10-CM

## 2013-10-03 DIAGNOSIS — I509 Heart failure, unspecified: Secondary | ICD-10-CM

## 2013-10-03 DIAGNOSIS — E669 Obesity, unspecified: Secondary | ICD-10-CM

## 2013-10-03 DIAGNOSIS — J4489 Other specified chronic obstructive pulmonary disease: Secondary | ICD-10-CM

## 2013-10-03 DIAGNOSIS — R918 Other nonspecific abnormal finding of lung field: Secondary | ICD-10-CM

## 2013-10-03 DIAGNOSIS — I5033 Acute on chronic diastolic (congestive) heart failure: Secondary | ICD-10-CM

## 2013-10-03 DIAGNOSIS — J449 Chronic obstructive pulmonary disease, unspecified: Secondary | ICD-10-CM

## 2013-10-03 LAB — CBC
HEMATOCRIT: 29.5 % — AB (ref 36.0–46.0)
HEMOGLOBIN: 9 g/dL — AB (ref 12.0–15.0)
MCH: 24.1 pg — ABNORMAL LOW (ref 26.0–34.0)
MCHC: 30.5 g/dL (ref 30.0–36.0)
MCV: 79.1 fL (ref 78.0–100.0)
Platelets: 149 10*3/uL — ABNORMAL LOW (ref 150–400)
RBC: 3.73 MIL/uL — ABNORMAL LOW (ref 3.87–5.11)
RDW: 24.6 % — ABNORMAL HIGH (ref 11.5–15.5)
WBC: 13.3 10*3/uL — AB (ref 4.0–10.5)

## 2013-10-03 LAB — GLUCOSE, CAPILLARY
GLUCOSE-CAPILLARY: 100 mg/dL — AB (ref 70–99)
GLUCOSE-CAPILLARY: 130 mg/dL — AB (ref 70–99)
Glucose-Capillary: 77 mg/dL (ref 70–99)
Glucose-Capillary: 119 mg/dL — ABNORMAL HIGH (ref 70–99)

## 2013-10-03 LAB — COMPREHENSIVE METABOLIC PANEL
ALT: 17 U/L (ref 0–35)
AST: 16 U/L (ref 0–37)
Albumin: 2.1 g/dL — ABNORMAL LOW (ref 3.5–5.2)
Alkaline Phosphatase: 106 U/L (ref 39–117)
BILIRUBIN TOTAL: 0.2 mg/dL — AB (ref 0.3–1.2)
BUN: 18 mg/dL (ref 6–23)
CO2: 27 mEq/L (ref 19–32)
CREATININE: 0.79 mg/dL (ref 0.50–1.10)
Calcium: 9.6 mg/dL (ref 8.4–10.5)
Chloride: 102 mEq/L (ref 96–112)
GFR calc Af Amer: >90 mL/min (ref >90)
GFR calc non Af Amer: 87 mL/min — ABNORMAL LOW (ref >90)
Glucose, Bld: 84 mg/dL (ref 70–99)
Potassium: 3.4 mEq/L — ABNORMAL LOW (ref 3.7–5.3)
Sodium: 142 mEq/L (ref 137–147)
Total Protein: 5.6 g/dL — ABNORMAL LOW (ref 6.0–8.3)

## 2013-10-03 LAB — PHOSPHORUS: PHOSPHORUS: 3 mg/dL (ref 2.3–4.6)

## 2013-10-03 LAB — MAGNESIUM: MAGNESIUM: 1.5 mg/dL (ref 1.5–2.5)

## 2013-10-03 MED ORDER — ARFORMOTEROL TARTRATE 15 MCG/2ML IN NEBU
15.0000 ug | INHALATION_SOLUTION | Freq: Two times a day (BID) | RESPIRATORY_TRACT | Status: DC
  Administered 2013-10-03 – 2013-10-06 (×6): 15 ug via RESPIRATORY_TRACT
  Filled 2013-10-03 (×8): qty 2

## 2013-10-03 MED ORDER — METHYLPREDNISOLONE SODIUM SUCC 40 MG IJ SOLR
40.0000 mg | Freq: Two times a day (BID) | INTRAMUSCULAR | Status: DC
  Administered 2013-10-03 – 2013-10-05 (×4): 40 mg via INTRAVENOUS
  Filled 2013-10-03 (×6): qty 1

## 2013-10-03 MED ORDER — INSULIN ASPART 100 UNIT/ML ~~LOC~~ SOLN
0.0000 [IU] | SUBCUTANEOUS | Status: DC
  Administered 2013-10-03 – 2013-10-05 (×7): 1 [IU] via SUBCUTANEOUS

## 2013-10-03 MED ORDER — ALBUTEROL SULFATE (2.5 MG/3ML) 0.083% IN NEBU
2.5000 mg | INHALATION_SOLUTION | RESPIRATORY_TRACT | Status: DC | PRN
  Administered 2013-10-03 – 2013-10-04 (×2): 2.5 mg via RESPIRATORY_TRACT
  Filled 2013-10-03 (×2): qty 3

## 2013-10-03 MED ORDER — MORPHINE SULFATE 2 MG/ML IJ SOLN
2.0000 mg | INTRAMUSCULAR | Status: DC | PRN
  Administered 2013-10-03: 2 mg via INTRAVENOUS
  Filled 2013-10-03 (×2): qty 1

## 2013-10-03 MED ORDER — BUDESONIDE 0.5 MG/2ML IN SUSP
0.5000 mg | Freq: Two times a day (BID) | RESPIRATORY_TRACT | Status: DC
  Administered 2013-10-03 – 2013-10-06 (×6): 0.5 mg via RESPIRATORY_TRACT
  Filled 2013-10-03 (×11): qty 2

## 2013-10-03 NOTE — Progress Notes (Signed)
INITIAL NUTRITION ASSESSMENT  Pt meets criteria for severe MALNUTRITION in the context of acute illness as evidenced by <50% estimated energy intake in the past week with 10% weight loss in the past month.  DOCUMENTATION CODES Per approved criteria  -Severe malnutrition in the context of acute illness or injury -Obesity Unspecified   INTERVENTION: - Recommend Resource Breeze PRN when diet ordered (no diet currently ordered) - Recommend MD replace pt's low potassium - Recommend palliative care consult r/t pt's refusal to eat/drink - Will continue to monitor   NUTRITION DIAGNOSIS: Inadequate oral intake related to inability to eat as evidenced by NPO.   Goal: Advance diet as tolerated to diabetic diet   Monitor:  Weights, labs, diet advancement  Reason for Assessment: Malnutrition screening tool, low braden  63 y.o. 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 Patient was supposed to be followed with home hospice after discharge in March, but patient refused hospice care. She presents to the ED with worsening generalized weakness for the past 2 weeks. She is now so weak she cant get up out of bed to do anything, and over the past couple of days has developed a sacral pressure ulcer.   Pt known to RD from previous admission last month, during which time pt weighed 197 pounds, now weighs 177 pounds. Family reports pt had no appetite for the past 2 weeks and did not eat any solid foods. Ate some applesauce with medications and started off drinking smoothies (fruit and vegetable kind) 2-3 per day for the first 1.5 weeks but then it went to only 1 per day. Had 2 small baby food size containers of pinto beans yesterday. Refused to drink water today. Does not want any creamy nutritional supplements but is willing to try [employer]. Pt's eyes closed during most  of visit.   Potassium low  Height: Ht Readings from Last 1 Encounters:  10/01/13 5\' 4"  (1.626 m)    Weight: Wt Readings from Last 1 Encounters:  10/02/13 177 lb 7.5 oz (80.5 kg)    Ideal Body Weight: 120 lbs   % Ideal Body Weight: 147%  Wt Readings from Last 10 Encounters:  10/02/13 177 lb 7.5 oz (80.5 kg)  09/08/13 195 lb 15.8 oz (88.9 kg)  08/25/13 203 lb (92.08 kg)  08/14/13 211 lb 10.3 oz (96 kg)  07/07/13 216 lb (97.977 kg)  06/22/13 212 lb 12.8 oz (96.525 kg)  06/13/13 208 lb (94.348 kg)  05/30/13 210 lb (95.255 kg)  05/13/13 218 lb 3.2 oz (98.975 kg)  05/03/13 215 lb (97.523 kg)    Usual Body Weight: 197 lb at the end of March 2015  % Usual Body Weight: 90%  BMI:  Body mass index is 30.45 kg/(m^2). Class I obesity   Estimated Nutritional Needs: Kcal: 1400-1600 Protein: 65-85g Fluid: 1.4-1.6L/day   Skin: Stage 2 pressure ulcer on sacrum   Diet Order:  No diet ordered   EDUCATION NEEDS: -No education needs identified at this time   Intake/Output Summary (Last 24 hours) at 10/03/13 1449 Last data filed at 10/03/13 1302  Gross per 24 hour  Intake   1390 ml  Output    645 ml  Net    745 ml    Last BM: 4/18   Labs:   Recent Labs Lab 10/01/13 1630  10/02/13 0355 10/02/13 0710 10/03/13 0620  NA  135*  --  141  --  142  K 5.9*  < > 3.8 3.3* 3.4*  CL 94*  --  102  --  102  CO2 26  --  27  --  27  BUN 35*  --  26*  --  18  CREATININE 1.17*  --  0.84  --  0.79  CALCIUM 10.6*  --  9.0  --  9.6  MG  --   --   --   --  1.5  PHOS  --   --   --   --  3.0  GLUCOSE 185*  --  103*  --  84  < > = values in this interval not displayed.  CBG (last 3)   Recent Labs  10/02/13 2244 10/03/13 0818 10/03/13 1159  GLUCAP 143* 77 119*    Scheduled Meds: . arformoterol  15 mcg Nebulization BID  . budesonide (PULMICORT) nebulizer solution  0.5 mg Nebulization BID  . Chlorhexidine Gluconate Cloth  6 each Topical Q0600  . ciprofloxacin  400 mg  Intravenous Q12H  . feeding supplement (ENSURE COMPLETE)  237 mL Oral BID BM  . heparin  5,000 Units Subcutaneous 3 times per day  . imipenem-cilastatin  500 mg Intravenous 3 times per day  . insulin aspart  0-9 Units Subcutaneous 6 times per day  . levothyroxine  50 mcg Oral QAC breakfast  . methylPREDNISolone (SOLU-MEDROL) injection  40 mg Intravenous Q12H  . mupirocin ointment  1 application Nasal BID  . nystatin cream  1 application Topical BID  . pantoprazole  40 mg Oral Daily  . sodium chloride  3 mL Intravenous Q12H  . vancomycin  750 mg Intravenous Q12H    Continuous Infusions:   Past Medical History  Diagnosis Date  . Asthma   . Emphysema     02 dependent 3L/min  . Emphysema   . Pneumonia   . Hypothyroidism   . Diabetes mellitus, type II   . Obesity   . Diastolic heart failure 09/22/2012    Grade 1  . Emphysema   . Rheumatoid arthritis(714.0)   . Hyperlipidemia   . Hypertension   . Vitamin D deficiency     Past Surgical History  Procedure Laterality Date  . Tubal ligation    . Colonoscopy    . Video bronchoscopy N/A 11/18/2012    Procedure: VIDEO BRONCHOSCOPY WITH FLUORO;  Surgeon: Storm Frisk, MD;  Location: [facility];  Service: Cardiopulmonary;  Laterality: N/A;  . Eye surgery  at age 8 [address] [place], [place], [place] [phone] Pager [phone] After Hours Pager

## 2013-10-03 NOTE — Progress Notes (Addendum)
Chaplain provided support with family and patient at bedside.  Completed healthcare power of attorney.  Copies in chart and scanned in medical records.  Will follow up with Jacqueline Washington for support around her health, integration of her faith and deteriorating health, and openness to hospice.   [name] MDiv

## 2013-10-03 NOTE — Consult Note (Signed)
WOC wound consult note Reason for Consult: Stage II pressure ulcers to bilateral upper buttocks.  Patient refuses to turn and reposition.  Wound type: Pressure ulcer.  Pressure Ulcer POA: Yes Measurement:6.5 cm x 9 cm x 0.1 cm left gluteal/ 4.5 cm x 4 cm x 0.1 cm Wound bed: pink and moist Drainage (amount, consistency, odor) Minimal serous drainage  Periwound: Intact Dressing procedure/placement/frequency: Cleanse buttock ulcers with NS and pat gently dry.  Apply Allevyn silicone border foam to nonintact areas and turn and reposition every 2 hours, as patient permits. Change every 3 days and PRN soilage.  Will not follow at this time.  Please re-consult if needed.  Maple Hudson RN BSN CWON Pager [phone]

## 2013-10-03 NOTE — Progress Notes (Signed)
[facility]  Patient Status: Active (receiving services up to time of hospitalization)  [facility] is providing the following services: RN, PT and HHA  If patient discharges after hours, please call [phone].   Lanae Crumbly 10/03/2013, 2:53 PM

## 2013-10-03 NOTE — Progress Notes (Signed)
[facility] PROGRESS NOTE  HYDIE LANGAN BMW:[id number] DOB: 04-Dec-1950 DOA: 10/01/2013 PCP: Nadean Corwin, MD  Interim Summary Jacqueline Washington is a 63 y.o. 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 Patient was found to be septic with WBC 23,000, and was in acute renal failure. She presented to the ED with worsening generalized weakness for the past 2 weeks to the extent where she can't get up out of bed to do anything. She has also developed a sacral decubitus over the past few days. She had Poor PO fluid intake for 3 days prior to admission, per her family. Patient was supposed to be followed by home hospice after discharge in March, but she refused hospice care. Today she explains that she is just afraid of the idea of hospice. She would rather continue working with advised home care. Would therefore defer her to home without services or cons either sneaks once medically stable. Patient's white count she should decrease The source of sepsis is not clear at this point and as her lungs don't seem significantly changed from the last hospitalization. Pulmonary evaluated the patient and their recommendation that likely this is mild aspiration and/or bronchiectasis flare repeatedly. She remains DO NOT RESUSCITATE. Given everything, patient herself says that she does not want to have a swallow evaluation or any swollen restrictions, yet she doesn't want hospice either. Plan Severe sepsis with acute organ dysfunction  Follow septic workup  Broad-spectrum antibiotics including Vancomycin/Imipenem/Ciprofloxacin to complete 7-10 days.  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 Sacral decubitus ulcer  Supportive care per wound care DVT/GI prophylaxis  Heparin subcutaneous  PPI Code Status: DO NOT RESUSCITATE Family Communication: Spoke with patient's daughter at the bedside Disposition Plan: Continue in step down for patient progresses    Consultants:  Wound care  Pulmonary  Procedures:  None  Antibiotics:  Vancomycin/imipenem 10/01/13> present   Ciprofloxacin 10/02/13>  HPI/Subjective: Still feels very weak. Coughing  Objective: Filed Vitals:   10/03/13 1500  BP:   Pulse: 120  Temp:   Resp: 12    Intake/Output Summary (Last 24 hours) at 10/03/13 1553 Last data filed at 10/03/13 1500  Gross per 24 hour  Intake   1510 ml  Output    645 ml  Net    865 ml   Filed Weights   10/01/13 1957 10/02/13 0400  Weight: 86.183 kg (190 lb) 80.5 kg (177 lb 7.5 oz)    Exam:   General:  Somewhat dyspneic with a wet cough alert and oriented, flattened affect she  Cardiovascular: Tachycardic. S1S2 normal.   Respiratory: Bilateral air entry. PICC line right upper chest. Decreased breath sounds throughout  A hebdomen: Soft and nontender. Positive bowel sounds  Musculoskeletal: No pedal edema  Data Reviewed: Basic Metabolic Panel:  Recent Labs Lab 10/01/13 1630 10/01/13 2315 10/02/13 0355 10/02/13 0710 10/03/13 0620  NA 135*  --  141  --  142  K 5.9* 3.9 3.8 3.3* 3.4*  CL 94*  --  102  --  102  CO2 26  --  27  --  27  GLUCOSE 185*  --  103*  --  84  BUN 35*  --  26*  --  18  CREATININE 1.17*  --  0.84  --  0.79  CALCIUM 10.6*  --  9.0  --  9.6  MG  --   --   --   --  1.5  PHOS  --   --   --   --  3.0   Liver Function Tests:  Recent Labs Lab 10/01/13 1630 10/03/13 0620  AST 17 16  ALT 19 17  ALKPHOS 128* 106  BILITOT 0.3 0.2*  PROT 7.1 5.6*  ALBUMIN 2.6* 2.1*    Recent Labs Lab 10/01/13 1630  LIPASE 27   No results found for this basename: AMMONIA,   in the last 168 hours CBC:  Recent Labs Lab 10/01/13 1630 10/02/13 0355 10/03/13 0620  WBC 23.4* 15.9* 13.3*  NEUTROABS 22.0* 14.3*  --   HGB 11.3* 9.1* 9.0*  HCT 36.8 29.9* 29.5*  MCV 78.8 79.3 79.1  PLT 223 149* 149*   Cardiac Enzymes:  Recent Labs Lab 10/01/13 1630  TROPONINI <0.30   BNP (last 3 results)  Recent Labs  04/14/13 2300 08/10/13 1554 10/01/13 1630  PROBNP 301.6* 1099.0* 1056.0*   CBG:  Recent Labs Lab 10/02/13 1209 10/02/13 1615 10/02/13 2244 10/03/13 0818 10/03/13 1159  GLUCAP 96 127* 143* 77 119*    Recent Results (from the past 240 hour(s))  CULTURE, BLOOD (ROUTINE X 2)     Status: None   Collection Time    10/01/13  5:50 PM      Result Value Ref Range Status   Specimen Description BLOOD PICC  10 ML IN [facility] BOTTLE   Final   Special Requests NONE   Final   Culture  Setup Time     Final   Value: 10/01/2013 22:09     Performed at [facility]   Culture     Final   Value:        BLOOD CULTURE RECEIVED NO GROWTH TO DATE CULTURE WILL BE HELD FOR 5 DAYS BEFORE ISSUING A FINAL NEGATIVE REPORT     Performed at [facility]   Report Status PENDING   Incomplete  CULTURE, BLOOD (ROUTINE X 2)     Status: None   Collection Time    10/01/13  6:05 PM      Result Value Ref Range Status   Specimen Description BLOOD RIGHT HAND  2 ML IN AEROBIC ONLY   Final   Special Requests NONE   Final   Culture  Setup Time     Final   Value: 10/01/2013 22:09     Performed at [facility]   Culture     Final   Value:        BLOOD CULTURE RECEIVED NO GROWTH TO DATE CULTURE WILL BE HELD FOR 5 DAYS BEFORE ISSUING A FINAL NEGATIVE REPORT     Performed at [facility]   Report Status PENDING   Incomplete  MRSA PCR SCREENING     Status: Abnormal   Collection Time    10/01/13  8:59 PM      Result Value Ref Range Status   MRSA by PCR POSITIVE (*) NEGATIVE Final   Comment:            The GeneXpert MRSA Assay (FDA     approved for  NASAL specimens     only), is one component of a     comprehensive MRSA colonization     surveillance program. It is not  intended to diagnose MRSA     infection nor to guide or     monitor treatment for     MRSA infections.     RESULT CALLED TO, READ BACK BY AND VERIFIED WITH:     GARLAND,G/2W @2235  ON 10/01/13 BY KARCZEWSKI,S.     Studies: Dg Chest 2 View  10/01/2013   CLINICAL DATA:  Shortness of breath. Asthma. Diabetes and hypertension. Rheumatoid arthritis.  EXAM: CHEST  2 VIEW  COMPARISON:  09/22/2013  FINDINGS: Bilateral upper lobe volume loss and pleural-parenchymal scarring is stable in appearance. Superior hilar retraction again seen bilaterally which is unchanged. No evidence of acute or superimposed infiltrate. No evidence of pleural effusion. Heart size remains normal. A right-sided internal jugular central venous catheter remains in stable position.  IMPRESSION: Stable upper lobe predominant pleural-parenchymal scarring. No acute findings identified.   Electronically Signed   By: 11/22/2013 M.D.   On: 10/01/2013 16:39    Scheduled Meds: . arformoterol  15 mcg Nebulization BID  . budesonide (PULMICORT) nebulizer solution  0.5 mg Nebulization BID  . Chlorhexidine Gluconate Cloth  6 each Topical Q0600  . ciprofloxacin  400 mg Intravenous Q12H  . feeding supplement (ENSURE COMPLETE)  237 mL Oral BID BM  . heparin  5,000 Units Subcutaneous 3 times per day  . imipenem-cilastatin  500 mg Intravenous 3 times per day  . insulin aspart  0-9 Units Subcutaneous 6 times per day  . levothyroxine  50 mcg Oral QAC breakfast  . methylPREDNISolone (SOLU-MEDROL) injection  40 mg Intravenous Q12H  . mupirocin ointment  1 application Nasal BID  . nystatin cream  1 application Topical BID  . pantoprazole  40 mg Oral Daily  . sodium chloride  3 mL Intravenous Q12H  . vancomycin  750 mg Intravenous Q12H   Continuous Infusions:      10/03/2013  [facility] Pager  [phone]  Time spent: 35 min  f 7PM -7AM, please contact night-coverage at [email or web address], password [facility] 10/03/2013, 3:53 PM  LOS: 2 days

## 2013-10-03 NOTE — Evaluation (Signed)
SLP Cancellation Note  Patient Details Name: Jacqueline Washington MRN: [id number] DOB: [birth year]   Cancelled treatment:       Reason Eval/Treat Not Completed:  (per RN, pt declines to participate in swallow evaluation, has been seen previously by SLP)   Donavan Burnet, MS [facility] SLP [phone]

## 2013-10-03 NOTE — Progress Notes (Signed)
eLink Physician-Brief Progress Note Patient Name: Jacqueline Washington DOB: [birth year] MRN: [id number]  Date of Service  10/03/2013   HPI/Events of Note   Pt very well known to me from multiple prior admissions. She is clearly deteriorating. She has refused home hospice , only wanting [facility] RNs.  She has agreed to palliaitve care consult   The family is in favor of hospice  eICU Interventions  Referral to pall care made    Intervention Category Major Interventions: End of life / care limitation discussion  Jacqueline Washington 10/03/2013, 4:20 PM

## 2013-10-03 NOTE — Consult Note (Signed)
.    Name: Jacqueline Washington MRN: [id number] DOB: [birth year]    ADMISSION DATE:  10/01/2013 CONSULTATION DATE:  4/20  REFERRING MD :  Rito Ehrlich  PRIMARY SERVICE:  [facility]   CHIEF COMPLAINT:   Acute on chronic respiratory failure  Possible sepsis  Failure to thrive    BRIEF PATIENT DESCRIPTION:  63 year old female, well known to [place] in setting of / chronic resp failure (pred and O2 dep (3-4 L), currently  30mg /d pred in setting of rheumatoid related pulmonary fibrosis, further c/b acute decompensations as a result of underlying emphysema and BTX, marked by recurrent bouts of what we have labeled as HCAPs (NOS) and/or recurrent bouts of  pneumonitis and bronchiolitis obliterans organized Pneumonia. Most recently discharged 3/26 after full course of ABX. Presents to ER again on 4/18 w/ CC: persistent and progressive weakness and findings c/w failure to thrive. PCCM asked to assist w/ her care.   SIGNIFICANT EVENTS / STUDIES:  feb/March 2015 we treated her empirically for CT findings c/w fungal ball w/VFEND. Her voriconazole was subsequently discontinued on March 17 as she was thought not to have aspergillosis. 3/26 discharged s/p rx for HCAP (NOS) 4/9: seen in our office w/ cc: weakness/ breathing at baseline   LINES / TUBES: PICC 2/26>>>  CULTURES: bcx2 4/18>>> MRSA screen 4/18: positive   ANTIBIOTICS: vanc 4/18>>> primaxin 4/18>>> cipro 4/18>>>  HISTORY OF PRESENT ILLNESS:    63 year old female w/ chronic resp failure (pred and O2 dep (3-4 L), currently  30mg /d pred in setting of rheumatoid related pulmonary fibrosis, further c/b acute decompensations as a result of underlying emphysema and BTX, marked by recurrent bouts of what we have labeled as HCAPs (NOS) and/or recurrent bouts of  pneumonitis and bronchiolitis obliterans organized Pneumonia. Back in feb/March 2015 we treated her empirically for CT findings c/w fungal ball w/VFEND. Her voriconazole was subsequently discontinued on  March 17 as she was thought not to have aspergillosis. She was most recently discharged on 3/26 for again what was felt to be HCAP, super-imposed on underlying Rhuematoid related lung disease, further complicated by element of volume excess due to decompensated diastolic dysfunction. She was seen in our office on 4/9. At that time her primary c/o was fatigue and weakness. She was discharged to home with hospice after last discharge, but subsequently declined them.  Since hospitalization she has been very sedentary - only sitting in a recliner chair and up only to bedside commode chair. Currently receiving sponge baths with family assistance. She presented to the ED on 4/18 with worsening generalized weakness. She is now so weak she cant get up out of bed to do anything, and over the past couple of days (prior to admit) had developed a sacral decubitus as well. Patient states that her breathing is unchanged since discharge in March (family does confirm this). Her only site of pain is her sacral decubits especially with movement. Poor PO fluid intake over past 3 days per family. PCCM has been asked to see in consult to add thoughts about her pulmonary status and to insure continuity of care.    PAST MEDICAL HISTORY :  Past Medical History  Diagnosis Date  . Asthma   . Emphysema     02 dependent 3L/min  . Emphysema   . Pneumonia   . Hypothyroidism   . Diabetes mellitus, type II   . Obesity   . Diastolic heart failure 09/22/2012    Grade 1  . Emphysema   .  Rheumatoid arthritis(714.0)   . Hyperlipidemia   . Hypertension   . Vitamin D deficiency    Past Surgical History  Procedure Laterality Date  . Tubal ligation    . Colonoscopy    . Video bronchoscopy N/A 11/18/2012    Procedure: VIDEO BRONCHOSCOPY WITH FLUORO;  Surgeon: Storm Frisk, MD;  Location: [facility];  Service: Cardiopulmonary;  Laterality: N/A;  . Eye surgery  at age 35   Prior to Admission medications   Medication Sig Start  Date End Date Taking? Authorizing Provider  albuterol (PROVENTIL) (2.5 MG/3ML) 0.083% nebulizer solution Take 3 mLs (2.5 mg total) by nebulization 4 (four) times daily. DX 496 06/23/13  Yes Storm Frisk, MD  cetirizine (ZYRTEC) 10 MG tablet Take 10 mg by mouth daily.   Yes Historical Provider, MD  Diphenhyd-Hydrocort-Nystatin (FIRST-DUKES MOUTHWASH) SUSP 5 ml swish and swallow every 1-2 hours for blisters in mouth 09/28/13  Yes Lucky Cowboy, MD  feeding supplement, ENSURE COMPLETE, (ENSURE COMPLETE) LIQD Take 237 mLs by mouth 2 (two) times daily between meals. 08/16/13  Yes Meredeth Ide, MD  furosemide (LASIX) 40 MG tablet Take 40 mg by mouth daily.    Yes Historical Provider, MD  ipratropium (ATROVENT) 0.02 % nebulizer solution Take 2.5 ml by Nebulizer 4 x daily 09/14/13  Yes Lucky Cowboy, MD  leflunomide (ARAVA) 20 MG tablet Take 20 mg by mouth daily. 06/06/13  Yes Historical Provider, MD  levothyroxine (SYNTHROID, LEVOTHROID) 50 MCG tablet Take 50 mcg by mouth daily before breakfast.   Yes Historical Provider, MD  lip balm (CARMEX) ointment Apply 1 application topically as needed for lip care.   Yes Historical Provider, MD  mometasone-formoterol (DULERA) 200-5 MCG/ACT AERO Inhale 2 puffs into the lungs 2 (two) times daily. 09/16/13 09/17/14 Yes Melissa R Smith, PA-C  nystatin cream (MYCOSTATIN) Apply 1 application topically 2 (two) times daily.   Yes Historical Provider, MD  pantoprazole (PROTONIX) 40 MG tablet Take 1 tablet (40 mg total) by mouth daily. 04/19/13  Yes Simonne Martinet, NP  potassium chloride SA (K-DUR,KLOR-CON) 20 MEQ tablet Take 60 mEq by mouth daily. 10/26/12  Yes Laveda Norman, MD  predniSONE (DELTASONE) 20 MG tablet Take 1.5 tablets (30 mg total) by mouth daily with breakfast. 09/08/13  Yes Renae Fickle, MD   Allergies  Allergen Reactions  . Chantix [Varenicline] Other (See Comments)    Unknown " blurry vision"  . Hctz [Hydrochlorothiazide] Other (See Comments)    unknown  .  Penicillins Rash  . Sulfonamide Derivatives Rash  . Tetanus Toxoid Other (See Comments)    Knot on arm    FAMILY HISTORY:  Family History  Problem Relation Age of Onset  . Asthma Mother   . Heart disease Mother   . Clotting disorder Mother   . Heart attack Mother   . Diabetes Mother   . Hypertension Mother   . Cirrhosis Mother   . Colon cancer Neg Hx   . Heart attack Father    SOCIAL HISTORY:  reports that she quit smoking about 6 years ago. Her smoking use included Cigarettes. She has a 129 pack-year smoking history. She has never used smokeless tobacco. She reports that she drinks alcohol. She reports that she does not use illicit drugs.  REVIEW OF SYSTEMS:   Constitutional: Negative for fever, chills, weight loss, malaise/fatigue and diaphoresis.  HENT: Negative for hearing loss, ear pain, nosebleeds, congestion, sore throat, neck pain, tinnitus and ear discharge.   Eyes: Negative for  blurred vision, double vision, photophobia, pain, discharge and redness.  Respiratory:cough, non-productive, difficult to expectorate, no hemoptysis, shortness of breath, essentially at baseline. No wheezing or stridor.   Cardiovascular: Negative for chest pain, palpitations, orthopnea, claudication, leg swelling and PND.  Gastrointestinal: Negative for heartburn, nausea, vomiting, abdominal pain, diarrhea, constipation, blood in stool and melena.  Genitourinary: Negative for dysuria, urgency, frequency, hematuria and flank pain.  Musculoskeletal: Negative for myalgias, back pain, joint pain and falls.  Skin: Negative for itching and rash.  Neurological: Negative for dizziness, tingling, tremors, sensory change, speech change, focal weakness, seizures, loss of consciousness, weakness and headaches.  Endo/Heme/Allergies: Negative for environmental allergies and polydipsia. Does not bruise/bleed easily.  SUBJECTIVE:  C/o weakness  VITAL SIGNS: Temp:  [97.7 F (36.5 C)-98.9 F (37.2 C)] 97.9 F  (36.6 C) (04/20 0800) Pulse Rate:  [111-126] 121 (04/20 1000) Resp:  [14-25] 21 (04/20 1000) BP: (108-126)/(65-76) 117/76 mmHg (04/20 0800) SpO2:  [93 %-99 %] 95 % (04/20 1000)  PHYSICAL EXAMINATION: General:  Chronically, now terminally ill appearing Neuro:  Profoundly weak, oriented x 4 HEENT:  , no JVD Cardiovascular:  rrr Lungs:  Decreased, + accessory muscle use  Abdomen:  Soft, no oM  Musculoskeletal:  Generalized weakness Skin:  Anasarca, scattered ecchymosis    Recent Labs Lab 10/01/13 1630  10/02/13 0355 10/02/13 0710 10/03/13 0620  NA 135*  --  141  --  142  K 5.9*  < > 3.8 3.3* 3.4*  CL 94*  --  102  --  102  CO2 26  --  27  --  27  BUN 35*  --  26*  --  18  CREATININE 1.17*  --  0.84  --  0.79  GLUCOSE 185*  --  103*  --  84  < > = values in this interval not displayed.  Recent Labs Lab 10/01/13 1630 10/02/13 0355 10/03/13 0620  HGB 11.3* 9.1* 9.0*  HCT 36.8 29.9* 29.5*  WBC 23.4* 15.9* 13.3*  PLT 223 149* 149*   Dg Chest 2 View  10/01/2013   CLINICAL DATA:  Shortness of breath. Asthma. Diabetes and hypertension. Rheumatoid arthritis.  EXAM: CHEST  2 VIEW  COMPARISON:  09/22/2013  FINDINGS: Bilateral upper lobe volume loss and pleural-parenchymal scarring is stable in appearance. Superior hilar retraction again seen bilaterally which is unchanged. No evidence of acute or superimposed infiltrate. No evidence of pleural effusion. Heart size remains normal. A right-sided internal jugular central venous catheter remains in stable position.  IMPRESSION: Stable upper lobe predominant pleural-parenchymal scarring. No acute findings identified.   Electronically Signed   By: Myles Rosenthal M.D.   On: 10/01/2013 16:39    ASSESSMENT / PLAN:  1) Chronic respiratory failure in setting of rheumatoid related ILD, COPD (GOLD C) and bronchiectasis  Recurrent pneumonias/ bronchiectasis flares:  Given the timeline of clinical deterioration typically days after completion of  antibiotics raises concern about bacterial colonization and recurrent infections due to her bronchiectasis. Certainly aspiration on diff dx, but probably more of a complicating factor than the reason she gets worse.  2) Recurrent sepsis: source is likely lungs but sacral decub and PICC also on diff dx 3) H/o diastolic dysfxn 4) AKI -->improved w/ IVFs 5) Stage II CKD 6) Type 2 NIDDM 7) Failure to thrive.   Discussion:   This is a difficult situation. She presents w/ what is likely BTX flare, suspect that she has chronic bacterial colonization, which is why she fails days after off  antibiotics. Certainly aspiration is a consideration, but doubt this is the cause of her decline.Marland KitchenMarland KitchenMore likely an issue now that she is weaker. At any rate she does not wish to pursue this as she would not want to consider alternative feeding or alteration on nutritional consistency. Unfortunately there is very little to offer. This is her 7th admission in 12 months, and she has shown marked physical decline specifically in the last 4 months. Spoke at length with her re: hospice and palliative care. Her daughters and husband cannot provide the care for her that she needs. This is a difficult position as really her only alternatives would be either SNF or hospice, both of which she does not want. Her family on the other hand is in support of hospice as they realize home with hospice support may only be the way that she can safely return to home, but she is adamantly against this now. Think current measures are about all that can be offered at this point.   Recs: -Continue empiric abx, when antibiotics complete would consider sending her home on prophylactic dosing of azithromycin 250mg  daily, as this may help decrease risk of recurrent exacerbation -Cont bronchial hygiene measures -Cont bronchodilators - resume systemic steroids, did not get resumed on admit. She has been pred dependent for months.  -Resume lasix when  able -d/c her ARAVA... This had been stopped indefinitely before.  -Would hold off on swallow eval. Suspect she would fail. Pt not interested in work-up as does not want alteration in diet consistency OR alternative nutritional support (G-tube, NGT, etc..) -Will cont to encourage her to consider palliative services. Not sure that home is a safe option without them, unless social services might know of a way to get additional support to the family within their financial means  Patient is clearly deteriorating, she has evolving respiratory failure.  There is a concern here that she is simply unable to clear infection given poor mucociliary clearance and overall very poor lung function.  I fully support DNR status as if intubated anticipate will need trach/peg which are clearly not her wishes.  She remains very reluctant to accept palliative care/hospice but also refuses to go to a SNF.  I do not think home is a safe environment for her but she continues to insist on going home without hospice services and returns with repeat symptoms.  Would recommend d/c on antibiotics as above when ready for d/c and in the meantime will continue to encourage hospice/palliation.   Patient seen and examined, agree with above note, I directed care as dictated above.  , M.D. [facility] Pulmonary/Critical Care Medicine. Pager: [phone]. After hours pager: [phone].

## 2013-10-04 DIAGNOSIS — J9600 Acute respiratory failure, unspecified whether with hypoxia or hypercapnia: Secondary | ICD-10-CM

## 2013-10-04 DIAGNOSIS — J471 Bronchiectasis with (acute) exacerbation: Secondary | ICD-10-CM | POA: Diagnosis present

## 2013-10-04 DIAGNOSIS — Z66 Do not resuscitate: Secondary | ICD-10-CM

## 2013-10-04 LAB — EXPECTORATED SPUTUM ASSESSMENT W REFEX TO RESP CULTURE

## 2013-10-04 LAB — BASIC METABOLIC PANEL
BUN: 17 mg/dL (ref 6–23)
CHLORIDE: 98 meq/L (ref 96–112)
CO2: 28 meq/L (ref 19–32)
Calcium: 9.5 mg/dL (ref 8.4–10.5)
Creatinine, Ser: 0.8 mg/dL (ref 0.50–1.10)
GFR calc non Af Amer: 77 mL/min — ABNORMAL LOW (ref >90)
GFR, EST AFRICAN AMERICAN: 90 mL/min — AB (ref >90)
Glucose, Bld: 107 mg/dL — ABNORMAL HIGH (ref 70–99)
POTASSIUM: 4.1 meq/L (ref 3.7–5.3)
Sodium: 138 mEq/L (ref 137–147)

## 2013-10-04 LAB — CBC
HEMATOCRIT: 28 % — AB (ref 36.0–46.0)
HEMOGLOBIN: 8.6 g/dL — AB (ref 12.0–15.0)
MCH: 24.2 pg — ABNORMAL LOW (ref 26.0–34.0)
MCHC: 30.7 g/dL (ref 30.0–36.0)
MCV: 78.9 fL (ref 78.0–100.0)
Platelets: 134 10*3/uL — ABNORMAL LOW (ref 150–400)
RBC: 3.55 MIL/uL — AB (ref 3.87–5.11)
RDW: 24.7 % — ABNORMAL HIGH (ref 11.5–15.5)
WBC: 9.1 10*3/uL (ref 4.0–10.5)

## 2013-10-04 LAB — GLUCOSE, CAPILLARY
GLUCOSE-CAPILLARY: 122 mg/dL — AB (ref 70–99)
GLUCOSE-CAPILLARY: 101 mg/dL — AB (ref 70–99)
GLUCOSE-CAPILLARY: 111 mg/dL — AB (ref 70–99)
GLUCOSE-CAPILLARY: 123 mg/dL — AB (ref 70–99)
Glucose-Capillary: 121 mg/dL — ABNORMAL HIGH (ref 70–99)

## 2013-10-04 LAB — VANCOMYCIN, TROUGH: Vancomycin Tr: 26.1 ug/mL (ref 10.0–20.0)

## 2013-10-04 LAB — HIV ANTIBODY (ROUTINE TESTING W REFLEX): HIV 1&2 Ab, 4th Generation: NONREACTIVE

## 2013-10-04 MED ORDER — LORAZEPAM 2 MG/ML IJ SOLN
1.0000 mg | INTRAMUSCULAR | Status: DC | PRN
  Administered 2013-10-05: 1 mg via INTRAVENOUS
  Filled 2013-10-04: qty 1

## 2013-10-04 MED ORDER — FUROSEMIDE 10 MG/ML IJ SOLN
40.0000 mg | Freq: Two times a day (BID) | INTRAMUSCULAR | Status: AC
  Administered 2013-10-04 (×2): 40 mg via INTRAVENOUS
  Filled 2013-10-04 (×2): qty 4

## 2013-10-04 MED ORDER — MORPHINE SULFATE 2 MG/ML IJ SOLN
2.0000 mg | INTRAMUSCULAR | Status: DC | PRN
  Administered 2013-10-04 – 2013-10-06 (×8): 2 mg via INTRAVENOUS
  Filled 2013-10-04 (×7): qty 1

## 2013-10-04 MED ORDER — LEVOTHYROXINE SODIUM 100 MCG IV SOLR
25.0000 ug | Freq: Every day | INTRAVENOUS | Status: DC
  Administered 2013-10-05: 25 ug via INTRAVENOUS
  Filled 2013-10-04 (×3): qty 5

## 2013-10-04 MED ORDER — POTASSIUM CHLORIDE 20 MEQ/15ML (10%) PO LIQD
20.0000 meq | Freq: Two times a day (BID) | ORAL | Status: DC
  Administered 2013-10-04: 20 meq via ORAL
  Filled 2013-10-04 (×2): qty 15

## 2013-10-04 MED ORDER — IMIPENEM-CILASTATIN 500 MG IV SOLR
500.0000 mg | Freq: Four times a day (QID) | INTRAVENOUS | Status: DC
  Administered 2013-10-04 – 2013-10-05 (×4): 500 mg via INTRAVENOUS
  Filled 2013-10-04 (×7): qty 500

## 2013-10-04 MED ORDER — VANCOMYCIN HCL 500 MG IV SOLR
500.0000 mg | Freq: Two times a day (BID) | INTRAVENOUS | Status: DC
  Administered 2013-10-04 – 2013-10-05 (×2): 500 mg via INTRAVENOUS
  Filled 2013-10-04 (×4): qty 500

## 2013-10-04 NOTE — Progress Notes (Signed)
[facility] PROGRESS NOTE  Jacqueline Washington WUJ:[id number] DOB: [birth year] DOA: 10/01/2013 PCP: Nadean Corwin, MD  Interim Summary Jacqueline Washington is a 63 y.o. female with extensive history of recurrent HCAP admits over the past year, end stage pulmonary disease in part from COPD plus rheumatoid lung, 3L home O2 for her emphyzema. Patient was found to be septic with WBC 23,000, and was in acute renal failure. She presented to the ED with worsening generalized weakness for the past 2 weeks to the extent where she can't get up out of bed to do anything. She has also developed a sacral decubitus over the past few days. She had Poor PO fluid intake for 3 days prior to admission, per her family.   Patient was supposed to be followed by home hospice after discharge in March, but she refused hospice care, feeling of the idea and would rather continue working with advised home care. Patient was started on antibiotics and has responded somewhat to this. Her overall lung function continues to worsen. Pulmonary medicine has been consulted. It is felt that her frequent exacerbations are from plus recurrent bronchiectasis flares plus likely some underlying aspiration. Patient does not want a swallow evaluation done and will not adhere to any swelling obstructions. Overnight 4/20, patient's respiratory status continues to worsen. She initially was on BiPAP and has been able to be weaned to a 50% Ventimask 4/21 morning.  She remains DO NOT RESUSCITATE.   Plan Severe sepsis with acute organ dysfunction  Broad-spectrum antibiotics including Vancomycin/Imipenem/Ciprofloxacin to complete 7-10 days and if patient survives this hospitalization, Zithromax 250 mg daily  IV fluids  Acute and chronic respiratory failure/Copd Gold C -from COPD, bronchiectasis, aspiration  Continue bronchodilators/oxygen supplementation/systemic steroids  Respiratory status is worse. Currently on Ventimask.  AKI (acute  kidney injury)/Hyperkalemia  Resolved with gentle fluids. Monitor closely given attempts at diuresis  Monitor.  Avoid nephrotoxic medications  T2 NIDDM w/Stage II CKD (GFR 73 ml/min)  SSI.  Sacral decubitus ulcer  Supportive care per wound care.On air mattress   Hypothyroidism  Continue Synthroid-change to IV to limit by mouth medicines  Severe Protein calorie malnutrition  Pt meets criteria for severe MALNUTRITION in the context of acute illness as evidenced by <50% estimated energy intake in the past week with 10% weight loss in the past month. On resource bruits when necessary  Acute on chronic diastolic congestive heart failure  BNP mildly elevated, although some of this may be from respiratory issues as well. Trying gentle Lasix to diurese to try to improve her symptoms.  Rheumatoid arthritis  Rheumatoid lung playing a role in patient's overall chronic lung disease. discontinued the patient's Arava, given recurrent infections  Obesity  Patient meets criteria with BMI of 31  DVT/GI prophylaxis  Heparin subcutaneous  PPI  Code Status: DO NOT RESUSCITATE Family Communication: Called patient's daughter Disposition Plan: Poor prognosis. Concerned she may not survive this hospitalization    Consultants:  Wound care  Pulmonary  Procedures:  None  Antibiotics:  Vancomycin/imipenem 10/01/13> present   Ciprofloxacin 10/02/13>  HPI/Subjective: Still feels very weak. Coughing  Objective: Filed Vitals:   10/04/13 0800  BP:   Pulse:   Temp: 98.2 F (36.8 C)  Resp:     Intake/Output Summary (Last 24 hours) at 10/04/13 1016 Last data filed at 10/04/13 0900  Gross per 24 hour  Intake   1145 ml  Output    860 ml  Net    285 ml   [employer]  10/01/13 1957 10/02/13 0400  Weight: 86.183 kg (190 lb) 80.5 kg (177 lb 7.5 oz)    Exam:   General:  Somewhat dyspneic with a wet cough alert and oriented, flattened affect she  Cardiovascular:  Tachycardic. S1S2 normal.   Respiratory: Bilateral air entry. PICC line right upper chest. Decreased breath sounds throughout  A hebdomen: Soft and nontender. Positive bowel sounds  Musculoskeletal: No pedal edema  Data Reviewed: Basic Metabolic Panel:  Recent Labs Lab 10/01/13 1630 10/01/13 2315 10/02/13 0355 10/02/13 0710 10/03/13 0620 10/04/13 0500  NA 135*  --  141  --  142 138  K 5.9* 3.9 3.8 3.3* 3.4* 4.1  CL 94*  --  102  --  102 98  CO2 26  --  27  --  27 28  GLUCOSE 185*  --  103*  --  84 107*  BUN 35*  --  26*  --  18 17  CREATININE 1.17*  --  0.84  --  0.79 0.80  CALCIUM 10.6*  --  9.0  --  9.6 9.5  MG  --   --   --   --  1.5  --   PHOS  --   --   --   --  3.0  --    Liver Function Tests:  Recent Labs Lab 10/01/13 1630 10/03/13 0620  AST 17 16  ALT 19 17  ALKPHOS 128* 106  BILITOT 0.3 0.2*  PROT 7.1 5.6*  ALBUMIN 2.6* 2.1*    Recent Labs Lab 10/01/13 1630  LIPASE 27   No results found for this basename: AMMONIA,  in the last 168 hours CBC:  Recent Labs Lab 10/01/13 1630 10/02/13 0355 10/03/13 0620 10/04/13 0500  WBC 23.4* 15.9* 13.3* 9.1  NEUTROABS 22.0* 14.3*  --   --   HGB 11.3* 9.1* 9.0* 8.6*  HCT 36.8 29.9* 29.5* 28.0*  MCV 78.8 79.3 79.1 78.9  PLT 223 149* 149* 134*   Cardiac Enzymes:  Recent Labs Lab 10/01/13 1630  TROPONINI <0.30   BNP (last 3 results)  Recent Labs  04/14/13 2300 08/10/13 1554 10/01/13 1630  PROBNP 301.6* 1099.0* 1056.0*   CBG:  Recent Labs Lab 10/03/13 1646 10/03/13 2003 10/03/13 2322 10/04/13 0331 10/04/13 0820  GLUCAP 122* 100* 130* 101* 121*    Recent Results (from the past 240 hour(s))  CULTURE, BLOOD (ROUTINE X 2)     Status: None   Collection Time    10/01/13  5:50 PM      Result Value Ref Range Status   Specimen Description BLOOD PICC  10 ML IN [facility] BOTTLE   Final   Special Requests NONE   Final   Culture  Setup Time     Final   Value: 10/01/2013 22:09     Performed at  [facility]   Culture     Final   Value:        BLOOD CULTURE RECEIVED NO GROWTH TO DATE CULTURE WILL BE HELD FOR 5 DAYS BEFORE ISSUING A FINAL NEGATIVE REPORT     Performed at [facility]   Report Status PENDING   Incomplete  CULTURE, BLOOD (ROUTINE X 2)     Status: None   Collection Time    10/01/13  6:05 PM      Result Value Ref Range Status   Specimen Description BLOOD RIGHT HAND  2 ML IN [facility] ONLY   Final   Special Requests NONE   Final  Culture  Setup Time     Final   Value: 10/01/2013 22:09     Performed at [facility]   Culture     Final   Value:        BLOOD CULTURE RECEIVED NO GROWTH TO DATE CULTURE WILL BE HELD FOR 5 DAYS BEFORE ISSUING A FINAL NEGATIVE REPORT     Performed at [facility]   Report Status PENDING   Incomplete  MRSA PCR SCREENING     Status: Abnormal   Collection Time    10/01/13  8:59 PM      Result Value Ref Range Status   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 It is not     intended to diagnose MRSA     infection nor to guide or     monitor treatment for     MRSA infections.     RESULT CALLED TO, READ BACK BY AND VERIFIED WITH:     GARLAND,G/2W @2235  ON 10/01/13 BY KARCZEWSKI,S.     Studies: No results found.  Scheduled Meds: . arformoterol  15 mcg Nebulization BID  . budesonide (PULMICORT) nebulizer solution  0.5 mg Nebulization BID  . Chlorhexidine Gluconate Cloth  6 each Topical Q0600  . ciprofloxacin  400 mg Intravenous Q12H  . feeding supplement (ENSURE COMPLETE)  237 mL Oral BID BM  . furosemide  40 mg Intravenous Q12H  . heparin  5,000 Units Subcutaneous 3 times per day  . imipenem-cilastatin  500 mg Intravenous 3 times per day  . insulin aspart  0-9 Units Subcutaneous 6 times per day  . levothyroxine  50 mcg Oral  QAC breakfast  . methylPREDNISolone (SOLU-MEDROL) injection  40 mg Intravenous Q12H  . mupirocin ointment  1 application Nasal BID  . nystatin cream  1 application Topical BID  . pantoprazole  40 mg Oral Daily  . potassium chloride  20 mEq Oral Q12H  . sodium chloride  3 mL Intravenous Q12H  . vancomycin  750 mg Intravenous Q12H   Continuous Infusions:      Hollice Espy  [facility] Pager [phone]  Time spent: 35 min  f 7PM -7AM, please contact night-coverage at [email or web address], password [facility] 10/04/2013, 10:16 AM  LOS: 3 days

## 2013-10-04 NOTE — Progress Notes (Signed)
.     Name: Jacqueline Washington MRN: [id number] DOB: 24-May-1951    ADMISSION DATE:  10/01/2013 CONSULTATION DATE:  4/20  REFERRING MD :  Rito Ehrlich  PRIMARY SERVICE:  [facility]   CHIEF COMPLAINT:   Acute on chronic respiratory failure  Possible sepsis  Failure to thrive    BRIEF PATIENT DESCRIPTION:  63 year old female, well known to [place] in setting of / chronic resp failure (pred and O2 dep (3-4 L), currently  30mg /d pred in setting of rheumatoid related pulmonary fibrosis, further c/b acute decompensations as a result of underlying emphysema and BTX, marked by recurrent bouts of what we have labeled as HCAPs (NOS) and/or recurrent bouts of  pneumonitis and bronchiolitis obliterans organized Pneumonia. Most recently discharged 3/26 after full course of ABX. Presents to ER again on 4/18 w/ CC: persistent and progressive weakness and findings c/w failure to thrive. PCCM asked to assist w/ her care.   SIGNIFICANT EVENTS / STUDIES:  feb/March 2015 we treated her empirically for CT findings c/w fungal ball w/VFEND. Her voriconazole was subsequently discontinued on March 17 as she was thought not to have aspergillosis. 3/26 discharged s/p rx for HCAP (NOS) 4/9: seen in our office w/ cc: weakness/ breathing at baseline   LINES / TUBES: PICC 2/26>>>  CULTURES: bcx2 4/18>>> MRSA screen 4/18: positive   ANTIBIOTICS: vanc 4/18>>> primaxin 4/18>>> cipro 4/18>>>  SUBJECTIVE:  C/o weakness  VITAL SIGNS: Temp:  [97.3 F (36.3 C)-98.4 F (36.9 C)] 97.8 F (36.6 C) (04/21 0400) Pulse Rate:  [104-132] 114 (04/21 0400) Resp:  [12-38] 15 (04/21 0400) BP: (114-150)/(70-97) 119/76 mmHg (04/21 0333) SpO2:  [87 %-100 %] 98 % (04/21 0733) FiO2 (%):  [50 %] 50 % (04/21 0733)  PHYSICAL EXAMINATION: General:  Chronically, now terminally ill appearing. + accessory muscle use  Neuro:  Profoundly weak, oriented x 4 HEENT:  [place], no JVD Cardiovascular:  rrr Lungs:  Diffuse coarse rhonchi, + accessory  muscle use  Abdomen:  Soft, no oM  Musculoskeletal:  Generalized weakness Skin:  Anasarca, scattered ecchymosis    Recent Labs Lab 10/02/13 0355 10/02/13 0710 10/03/13 0620 10/04/13 0500  NA 141  --  142 138  K 3.8 3.3* 3.4* 4.1  CL 102  --  102 98  CO2 27  --  27 28  BUN 26*  --  18 17  CREATININE 0.84  --  0.79 0.80  GLUCOSE 103*  --  84 107*    Recent Labs Lab 10/02/13 0355 10/03/13 0620 10/04/13 0500  HGB 9.1* 9.0* 8.6*  HCT 29.9* 29.5* 28.0*  WBC 15.9* 13.3* 9.1  PLT 149* 149* 134*   No results found.  ASSESSMENT / PLAN:  1) Chronic respiratory failure in setting of rheumatoid related ILD, COPD (GOLD C) and bronchiectasis  Recurrent pneumonias/ bronchiectasis flares:  Given the timeline of clinical deterioration typically days after completion of antibiotics raises concern about bacterial colonization and recurrent infections due to her bronchiectasis. Certainly aspiration on diff dx, but probably more of a complicating factor than the reason she gets worse.  2) Recurrent sepsis: source is likely lungs but sacral decub and PICC also on diff dx 3) H/o diastolic dysfxn 4) AKI -->improved w/ IVFs 5) Stage II CKD 6) Type 2 NIDDM 7) Failure to thrive.   Discussion See original consult on 4/20... She looks worse. Now w/ higher FIO2 requirements. Dr 5/20 has spoken w/ pt. They are now in agreement to palliative care consult. Will await results  of this. Again, nothing else to offer other than below. Have spoken w/ primary team. They are aware of our recs. For today really the only thing that may be of benefit would be diuresis as diastolic dysfxn has been an issue in the past.   Recs: -Continue empiric abx, when antibiotics complete would consider sending her home on prophylactic dosing of azithromycin 250mg  daily, as this may help decrease risk of recurrent exacerbation -Cont bronchial hygiene measures -Cont bronchodilators - resumed systemic steroids, eventually  wean back to pred/ 30mg /d -Resume lasix -d/c'd her ARAVA... This had been stopped indefinitely before. Would NOT resume -Palliative care Consult pending.   Patient rapidly deteriorating, she agreed to see palliative care, I do not anticipate her to survive this admission.  Recommend full comfort care.  Will attempt aggressive diureses today and monitor.  CC time 35 min.  Patient seen and examined, agree with above note.  I dictated the care and orders written for this patient under my direction.  , MD [phone]

## 2013-10-04 NOTE — Progress Notes (Signed)
Patient has been active with [facility] Care Management services. Will continue to follow. Inpatient RN case manager aware [facility] Care Management following.  Raiford Noble, MSN- RN,BSN- [facility] [phone]

## 2013-10-04 NOTE — Evaluation (Signed)
SLP Cancellation Note  Patient Details Name: Jacqueline Washington MRN: [id number] DOB: October 09, 1950   Cancelled treatment:       Reason Eval/Treat Not Completed:  (md please order evaluation if swallow evaluation indicated and pt agreeable, thanks. Note respiratory events during hospital coarse)   [name], [place] [facility] SLP [phone]

## 2013-10-04 NOTE — Consult Note (Signed)
Patient FP:[id number] C Tremaine      DOB: Feb 08, 1951      [id number]  Summary of Goals of Care; full consult to follow:  Met with Patient and her daughter.  Patient's spouse is included by decision making has always been between her daughter and the patient  Shakeita stated "its time, I am suffering , I am in pain (which she relates is from the sensation that she can't catch her breath).  She is willing to take some morphine to help with the air hunger. She and her daughter understand that she is likely dying . Iyanah did not want to be more prescriptive with me other than to say that she wants [place] to do what we need to to do to prevent her from suffering further.  We discussed using prn medication and if needed continuous medications which she did not necessarily want right now.   I do not believe that she will survive to discharge from the hospital.  We will reassess in am if she remains with [place] for having hospice involved.   Rec:  1.  DNR  2.  Add scop patch for secretions  3.   Dyspnea/air hunger : agree with morphine q 3 prn  4.  Will leave ativan 1 mg prn as well.  Time 420 pm 530 pm  Raynold Blankenbaker L. Lovena Le, MD MBA The Palliative Medicine Team at [facility] Phone: [phone] Pager: [phone]

## 2013-10-04 NOTE — Progress Notes (Signed)
Patient IW:[id number] C Girten      DOB: 1950/10/27      [id number]  Consult received. Daughter not in room when I came by this am.  Will return asap. Patient quiet and reserved, not her usual optimistic self.   Kirsti Mcalpine L. Ladona Ridgel, MD MBA The Palliative Medicine Team at [facility] Phone: [phone] Pager: [phone]

## 2013-10-04 NOTE — Consult Note (Signed)
Patient Jacqueline Washington      DOB: September 09, 1950      XNT:[id number]     Consult Note from the Palliative Medicine Team at [facility]    Consult Requested by:  Dr. Delford Field     PCP: Nadean Corwin, MD Reason for Consultation: GOC/symptom     Phone [phone]  Assessment of patients Current state: 63 yr old white female with end stage COPD, well known to our service from previous admissions.  Patient has declined hospice services in the past but today looks me in the eye and states that she is "suffering".  Family has not been able to care for her at home and the patient's daughter finally decided to force her to come back to the hospital .   We were able to talk about perceived bad feelings related to her daughter bringing her back to the hospital.  They were able to say their peace and were able to agree that it was likely time to consider comfort care.  [place] has always been in control of her care and at this time requests prn medications only at this time, despite being offered more aggressive therapy.  Daugher was updated on short expected prognosis.   Goals of Care: 1.  Code Status: DNR   2. Scope of Treatment: Patient desires to continue antibiotics and aggressive pulmonary toilet for now. She is agreeable to using some morphine to promote better comfort.  4. Disposition: suspect patient will have hospital death, she looks very close   3. Symptom Management:   1. Anxiety/Agitation: prn xanax helped before , will use ativan. 2. Pain: patient agrees to using prn morphine at this time although dyspnea likely warrants continuous opiates.  We talked about morphine drip and would have a low threshold for using this. She states she will tell us if she thinks she needs this.  3. Bowel Regimen: monitor predisposed to constipation 4. Terminal Secretions: low threshold for atropine/scopalomine  4. Psychosocial:  Daughter at the bedside.  She has a supportive son and spouse as  well.  Her faith has always sustained her she states that God is near now but not talking to her.  5. Spiritual: chaplain services offered.        Patient Documents Completed or Given: Document Given Completed  Advanced Directives Pkt    MOST    DNR    Gone from My Sight    Hard Choices      Brief HPI: 63 yr old white female, known to our service from recent admissions.  Patient admitted with respiratory failure.  We have been asked to reassess her openness for hospice care.   ROS: short of breath, no pain, sad, decreased appetite.    PMH:  Past Medical History  Diagnosis Date  . Asthma   . Emphysema     02 dependent 3L/min  . Emphysema   . Pneumonia   . Hypothyroidism   . Diabetes mellitus, type II   . Obesity   . Diastolic heart failure 09/22/2012    Grade 1  . Emphysema   . Rheumatoid arthritis(714.0)   . Hyperlipidemia   . Hypertension   . Vitamin D deficiency      PSH: Past Surgical History  Procedure Laterality Date  . Tubal ligation    . Colonoscopy    . Video bronchoscopy N/A 11/18/2012    Procedure: VIDEO BRONCHOSCOPY WITH FLUORO;  Surgeon: Storm Frisk, MD;  Location: [facility]COPY;  Service: Cardiopulmonary;  Laterality: N/A;  . Eye surgery  at age 74   I have reviewed the FH and SH and  If appropriate update it with new information. Allergies  Allergen Reactions  . Chantix [Varenicline] Other (See Comments)    Unknown " blurry vision"  . Hctz [Hydrochlorothiazide] Other (See Comments)    unknown  . Penicillins Rash  . Sulfonamide Derivatives Rash  . Tetanus Toxoid Other (See Comments)    Knot on arm   Scheduled Meds: . arformoterol  15 mcg Nebulization BID  . budesonide (PULMICORT) nebulizer solution  0.5 mg Nebulization BID  . Chlorhexidine Gluconate Cloth  6 each Topical Q0600  . ciprofloxacin  400 mg Intravenous Q12H  . feeding supplement (ENSURE COMPLETE)  237 mL Oral BID BM  . furosemide  40 mg Intravenous Q12H  . heparin   5,000 Units Subcutaneous 3 times per day  . imipenem-cilastatin  500 mg Intravenous 4 times per day  . insulin aspart  0-9 Units Subcutaneous 6 times per day  . [START ON 10/05/2013] levothyroxine  25 mcg Intravenous QAC breakfast  . methylPREDNISolone (SOLU-MEDROL) injection  40 mg Intravenous Q12H  . mupirocin ointment  1 application Nasal BID  . nystatin cream  1 application Topical BID  . sodium chloride  3 mL Intravenous Q12H  . vancomycin  500 mg Intravenous Q12H   Continuous Infusions:  PRN Meds:.albuterol, lip balm, morphine injection    BP 130/89  Pulse 124  Temp(Src) 98.3 F (36.8 C) (Axillary)  Resp 22  Ht 5\' 4"  (1.626 m)  Wt 80.5 kg (177 lb 7.5 oz)  BMI 30.45 kg/m2  SpO2 99%   PPS: 20%   Intake/Output Summary (Last 24 hours) at 10/04/13 1720 Last data filed at 10/04/13 1600  Gross per 24 hour  Intake   1255 ml  Output   1600 ml  Net   -345 ml   Physical Exam:  General: affect flat, grey, eyes glazed HEENT:  PERRL, EOMI, anicteric, mm dry Chest:   Decreased with coarse wet rhonchi, no wheezing at present CVS: tachy, S1, S2 Abdomen:morbidly obese, not tender , positive bowel sounds Ext: edema all extremities,  Neuro: flat affect, awake aler and appears to have capacity for decision making.  Labs: CBC    Component Value Date/Time   WBC 9.1 10/04/2013 0500   RBC 3.55* 10/04/2013 0500   HGB 8.6* 10/04/2013 0500   HCT 28.0* 10/04/2013 0500   PLT 134* 10/04/2013 0500   MCV 78.9 10/04/2013 0500   MCH 24.2* 10/04/2013 0500   MCHC 30.7 10/04/2013 0500   RDW 24.7* 10/04/2013 0500   LYMPHSABS 0.6* 10/02/2013 0355   MONOABS 1.0 10/02/2013 0355   EOSABS 0.0 10/02/2013 0355   BASOSABS 0.0 10/02/2013 0355      CMP     Component Value Date/Time   NA 138 10/04/2013 0500   K 4.1 10/04/2013 0500   CL 98 10/04/2013 0500   CO2 28 10/04/2013 0500   GLUCOSE 107* 10/04/2013 0500   BUN 17 10/04/2013 0500   CREATININE 0.80 10/04/2013 0500   CREATININE 0.90 09/14/2013 1052    CALCIUM 9.5 10/04/2013 0500   PROT 5.6* 10/03/2013 0620   ALBUMIN 2.1* 10/03/2013 0620   AST 16 10/03/2013 0620   ALT 17 10/03/2013 0620   ALKPHOS 106 10/03/2013 0620   BILITOT 0.2* 10/03/2013 0620   GFRNONAA 77* 10/04/2013 0500   GFRNONAA 69 09/14/2013 1052   GFRAA 90* 10/04/2013 0500   GFRAA 79 09/14/2013 1052  Chest Xray Reviewed/Impressions: Stable upper lobe predominant pleural-parenchymal scarring. No acute  findings identified.      Time In Time Out Total Time Spent with Patient Total Overall Time  420 pm 530 pm 62 min 70 min    Greater than 50%  of this time was spent counseling and coordinating care related to the above assessment and plan.  Bobbye Petti L. Ladona Ridgel, MD MBA The Palliative Medicine Team at [facility] Phone: [phone] Pager: [phone]

## 2013-10-04 NOTE — Progress Notes (Signed)
ANTIBIOTIC CONSULT NOTE - FOLLOW UP  Pharmacy Consult for Vancomycin, Primaxin, Cipro Indication: rule out sepsis  Allergies  Allergen Reactions  . Chantix [Varenicline] Other (See Comments)    Unknown " blurry vision"  . Hctz [Hydrochlorothiazide] Other (See Comments)    unknown  . Penicillins Rash  . Sulfonamide Derivatives Rash  . Tetanus Toxoid Other (See Comments)    Knot on arm    Patient Measurements: Height: 5\' 4"  (162.6 cm) Weight: 177 lb 7.5 oz (80.5 kg) IBW/kg (Calculated) : 54.7  Vital Signs: Temp: 97.8 F (36.6 C) (04/21 0400) Temp src: Oral (04/21 0400) BP: 119/76 mmHg (04/21 0333) Pulse Rate: 114 (04/21 0400) Intake/Output from previous day: 04/20 0701 - 04/21 0700 In: 1315 [I.V.:315; IV Piggyback:1000] Out: 630 [Urine:630]  Labs:  Recent Labs  10/02/13 0355 10/03/13 0620 10/04/13 0500  WBC 15.9* 13.3* 9.1  HGB 9.1* 9.0* 8.6*  PLT 149* 149* 134*  CREATININE 0.84 0.79 0.80   Estimated Creatinine Clearance: 74.8 ml/min (by C-G formula based on Cr of 0.8).  Recent Labs  10/04/13 0745  VANCOTROUGH 26.1*    Microbiology: Recent Results (from the past 720 hour(s))  CULTURE, BLOOD (ROUTINE X 2)     Status: None   Collection Time    10/01/13  5:50 PM      Result Value Ref Range Status   Specimen Description BLOOD PICC  10 ML IN [facility] BOTTLE   Final   Special Requests NONE   Final   Culture  Setup Time     Final   Value: 10/01/2013 22:09     Performed at 10/03/2013   Culture     Final   Value:        BLOOD CULTURE RECEIVED NO GROWTH TO DATE CULTURE WILL BE HELD FOR 5 DAYS BEFORE ISSUING A FINAL NEGATIVE REPORT     Performed at [facility]   Report Status PENDING   Incomplete  CULTURE, BLOOD (ROUTINE X 2)     Status: None   Collection Time    10/01/13  6:05 PM      Result Value Ref Range Status   Specimen Description BLOOD RIGHT HAND  2 ML IN AEROBIC ONLY   Final   Special Requests NONE   Final   Culture  Setup Time      Final   Value: 10/01/2013 22:09     Performed at 10/03/2013   Culture     Final   Value:        BLOOD CULTURE RECEIVED NO GROWTH TO DATE CULTURE WILL BE HELD FOR 5 DAYS BEFORE ISSUING A FINAL NEGATIVE REPORT     Performed at [facility]   Report Status PENDING   Incomplete  MRSA PCR SCREENING     Status: Abnormal   Collection Time    10/01/13  8:59 PM      Result Value Ref Range Status   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 It is not     intended to diagnose MRSA     infection nor to guide or     monitor treatment for     MRSA infections.  RESULT CALLED TO, READ BACK BY AND VERIFIED WITH:     GARLAND,G/2W @2235  ON 10/01/13 BY KARCZEWSKI,S.   Anti-infectives: 4/18 >> Vanc >> 4/18 >> Primaxin >>  4/19>>Cipro>>   Assessment: 17 yoF admitted 4/18 with increasing weakness and concern for sepsis. She was recently admitted (3/16 - 3/26) for HCAP, acute on chronic DCHF. CXR without acute disease, decubitus ulcers without cellulitis or abscess. Concern for mild aspiration or bronchiectasis flare.  Pharmacy is initially consulted to dose vancomycin and Primaxin, with Cipro added for double gram-negative coverage.  Tmax: Afeb  WBCs: 9.1  Renal: SCr 0.8, Cl ~ 75 CG  Blood cultures remain no growth  Vancomycin trough level 26.1, above goal range.  Goal of Therapy:  Vancomycin trough level 15-20 mcg/ml Appropriate abx dosing, eradication of infection.  Plan:   Continue cipro 400mg  IV q12h  Increase to Primaxin 500mg  IV q6h  Decrease to Vancomycin 500mg  IV q12h.  Measure Vanc trough at steady state.  Follow up renal fxn and culture results.   03-12-1984 PharmD, BCPS Pager [phone] 10/04/2013 11:07 AM

## 2013-10-04 NOTE — Progress Notes (Signed)
[id number]/Rhonda Duffee, RN, BSN, CCM, [phone] Chart reviewed for update of needs and condition./  

## 2013-10-05 DIAGNOSIS — D649 Anemia, unspecified: Secondary | ICD-10-CM | POA: Diagnosis present

## 2013-10-05 DIAGNOSIS — J471 Bronchiectasis with (acute) exacerbation: Secondary | ICD-10-CM

## 2013-10-05 DIAGNOSIS — Z515 Encounter for palliative care: Secondary | ICD-10-CM

## 2013-10-05 LAB — GLUCOSE, CAPILLARY
Glucose-Capillary: 114 mg/dL — ABNORMAL HIGH (ref 70–99)
Glucose-Capillary: 136 mg/dL — ABNORMAL HIGH (ref 70–99)
Glucose-Capillary: 143 mg/dL — ABNORMAL HIGH (ref 70–99)

## 2013-10-05 LAB — BASIC METABOLIC PANEL
BUN: 18 mg/dL (ref 6–23)
CALCIUM: 9.4 mg/dL (ref 8.4–10.5)
CO2: 33 meq/L — AB (ref 19–32)
CREATININE: 0.95 mg/dL (ref 0.50–1.10)
Chloride: 98 mEq/L (ref 96–112)
GFR calc non Af Amer: 63 mL/min — ABNORMAL LOW (ref >90)
GFR, EST AFRICAN AMERICAN: 73 mL/min — AB (ref >90)
Glucose, Bld: 126 mg/dL — ABNORMAL HIGH (ref 70–99)
Potassium: 2.9 mEq/L — CL (ref 3.7–5.3)
Sodium: 144 mEq/L (ref 137–147)

## 2013-10-05 MED ORDER — SODIUM CHLORIDE 0.9 % IV SOLN
500.0000 mg | Freq: Three times a day (TID) | INTRAVENOUS | Status: DC
  Filled 2013-10-05 (×2): qty 500

## 2013-10-05 MED ORDER — LORAZEPAM 2 MG/ML IJ SOLN
0.5000 mg | INTRAMUSCULAR | Status: DC | PRN
  Administered 2013-10-05: 0.5 mg via INTRAVENOUS
  Administered 2013-10-06 (×2): 1 mg via INTRAVENOUS
  Filled 2013-10-05 (×3): qty 1

## 2013-10-05 MED ORDER — ALTEPLASE 2 MG IJ SOLR
2.0000 mg | Freq: Once | INTRAMUSCULAR | Status: AC
  Administered 2013-10-05: 2 mg
  Filled 2013-10-05: qty 2

## 2013-10-05 MED ORDER — SODIUM CHLORIDE 0.9 % IV SOLN
500.0000 mg | Freq: Three times a day (TID) | INTRAVENOUS | Status: DC
  Administered 2013-10-05 – 2013-10-06 (×3): 500 mg via INTRAVENOUS
  Filled 2013-10-05 (×4): qty 500

## 2013-10-05 MED ORDER — METHYLPREDNISOLONE SODIUM SUCC 125 MG IJ SOLR
60.0000 mg | INTRAMUSCULAR | Status: DC
  Administered 2013-10-06: 60 mg via INTRAVENOUS
  Filled 2013-10-05 (×2): qty 0.96

## 2013-10-05 MED ORDER — POTASSIUM CHLORIDE 10 MEQ/100ML IV SOLN
10.0000 meq | INTRAVENOUS | Status: AC
  Administered 2013-10-05 (×5): 10 meq via INTRAVENOUS
  Filled 2013-10-05 (×3): qty 100

## 2013-10-05 NOTE — Progress Notes (Signed)
[id number]/IRCVEL Earlene Plater ,RN, BSN,CCM: Chart reviewed for patient updates and needs.patient is on iv ms  And is not expectedto survive this hospital stay, at present time palliative care is not being enacted but iv abx and care to get to [facility] are ongoing.

## 2013-10-05 NOTE — Progress Notes (Signed)
Progress Note   Jacqueline Washington PNT:[id number] DOB: February 07, 1951 DOA: 10/01/2013 PCP: Nadean Corwin, MD   Brief Narrative:   Jacqueline Washington is an 63 y.o. female with a PMH of end-stage pulmonary disease, chronic respiratory failure on 3 L of home oxygen, COPD, recurrent pneumonias who was admitted on 10/01/13 with generalized weakness, failure to thrive, and sepsis with acute renal failure. Despite aggressive therapy with antibiotics, her respiratory status has deteriorated and she required BiPAP which was weaned to a Ventimask on 10/04/13. She is now being followed by the palliative care team and is not expected to survive this hospital stay.  Assessment/Plan:   Principal Problem:   Severe sepsis with acute organ dysfunction / healthcare associated pneumonia  Although initial chest x-ray clear, source of sepsis felt to be from lungs in the setting of bronchiectasis/COPD.  Blood cultures negative to date, sputum culture pending.  Continue broad-spectrum antibiotics including vancomycin, imipenem and Cipro for a 10 day course.  Blood pressure stable. Active Problems:   Copd Gold C / acute and chronic respiratory failure / bronchiectasis with acute exacerbation / DO NOT RESUSCITATE  Continue supplemental oxygen to maintain saturations.  Continue albuterol, Brovana, empiric antibiotics and steroids. Currently at 40 mg of Solu-Medrol 12 hours.   Acute on chronic diastolic congestive heart failure, NYHA class 1   Hypothyroidism  Continue Synthroid.   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  CBGs 111-143.   Protein-calorie malnutrition, severe / Obesity (BMI 30-39.9)  Seen by dietitian, recommendations noted.  Continue nutritional supplements as tolerated.   Rheumatoid arthritis(714.0)  On chronic steroids prior to admission, currently on Solu-Medrol.   Hypertension  Continue Lasix.   AKI (acute kidney  injury)  Creatinine improved with IV fluids.   Hyperkalemia / hypokalemia  Patient was initially hyperkalemic in the setting of AKI., now hypokalemic and requiring supplementation.   Sacral decubitus ulcer  Seen by wound care nurse 10/03/13. Continue wound care per her medications.   Normocytic anemia  Likely anemia of chronic disease.   DVT Prophylaxis  Continue subcutaneous heparin.  Code Status: DNR Family Communication: No family at bedside. Disposition Plan: Not expected to survive this hospital stay.   IV Access:    PICC line   Procedures:    None.   Medical Consultants:    Dr. Derenda Mis, Palliative Care  Dr. Koren Bound, PCCM   Other Consultants:    Speech therapy  Dietitian  Wound care nurse   Anti-Infectives:    Vancomycin 10/01/13--->  Imipenem 10/01/13--->  Cipro 10/02/13--->  Subjective:   Jacqueline Washington is weak and lethargic. She awakens to voice and is without complaints of pain or current dyspnea. Nursing staff reports that she required morphine in the night for air hunger.  Objective:    Filed Vitals:   10/04/13 2005 10/04/13 2011 10/04/13 2122 10/05/13 0102  BP:  143/78  132/87  Pulse:  129  130  Temp:  98.7 F (37.1 C)    TempSrc:  Axillary    Resp:  21  22  Height:      Weight:      SpO2: 99% 97% 99% 98%    Intake/Output Summary (Last 24 hours) at 10/05/13 0715 Last data filed at 10/05/13 0635  Gross per 24 hour  Intake   1540 ml  Output   2295 ml  Net   -755 ml    Exam: Gen:  Lethargic Cardiovascular:  Tachycardic, No M/R/G Respiratory:  Lungs with scattered rales, diminished bases Gastrointestinal:  Abdomen soft, NT/ND, + BS Extremities:  + Edema   Data Reviewed:    Labs: Basic Metabolic Panel:  Recent Labs Lab 10/01/13 1630  10/02/13 0355  10/03/13 0620 10/04/13 0500 10/05/13 0313  NA 135*  --  141  --  142 138 144  K 5.9*  < > 3.8  < > 3.4* 4.1 2.9*  CL 94*  --  102  --  102 98 98   CO2 26  --  27  --  27 28 33*  GLUCOSE 185*  --  103*  --  84 107* 126*  BUN 35*  --  26*  --  18 17 18   CREATININE 1.17*  --  0.84  --  0.79 0.80 0.95  CALCIUM 10.6*  --  9.0  --  9.6 9.5 9.4  MG  --   --   --   --  1.5  --   --   PHOS  --   --   --   --  3.0  --   --   < > = values in this interval not displayed. GFR Estimated Creatinine Clearance: 63 ml/min (by C-G formula based on Cr of 0.95). Liver Function Tests:  Recent Labs Lab 10/01/13 1630 10/03/13 0620  AST 17 16  ALT 19 17  ALKPHOS 128* 106  BILITOT 0.3 0.2*  PROT 7.1 5.6*  ALBUMIN 2.6* 2.1*    Recent Labs Lab 10/01/13 1630  LIPASE 27   CBC:  Recent Labs Lab 10/01/13 1630 10/02/13 0355 10/03/13 0620 10/04/13 0500  WBC 23.4* 15.9* 13.3* 9.1  NEUTROABS 22.0* 14.3*  --   --   HGB 11.3* 9.1* 9.0* 8.6*  HCT 36.8 29.9* 29.5* 28.0*  MCV 78.8 79.3 79.1 78.9  PLT 223 149* 149* 134*   Cardiac Enzymes:  Recent Labs Lab 10/01/13 1630  TROPONINI <0.30   BNP (last 3 results)  Recent Labs  04/14/13 2300 08/10/13 1554 10/01/13 1630  PROBNP 301.6* 1099.0* 1056.0*   CBG:  Recent Labs Lab 10/04/13 0820 10/04/13 1133 10/04/13 1539 10/04/13 2045 10/05/13 0051  GLUCAP 121* 111* 123* 143* 114*   Sepsis Labs:  Recent Labs Lab 10/01/13 1630 10/01/13 1746 10/02/13 0355 10/03/13 0620 10/04/13 0500  WBC 23.4*  --  15.9* 13.3* 9.1  LATICACIDVEN  --  2.55*  --   --   --    Microbiology Recent Results (from the past 240 hour(s))  CULTURE, BLOOD (ROUTINE X 2)     Status: None   Collection Time    10/01/13  5:50 PM      Result Value Ref Range Status   Specimen Description BLOOD PICC  10 ML IN [facility] BOTTLE   Final   Special Requests NONE   Final   Culture  Setup Time     Final   Value: 10/01/2013 22:09     Performed at 10/03/2013   Culture     Final   Value:        BLOOD CULTURE RECEIVED NO GROWTH TO DATE CULTURE WILL BE HELD FOR 5 DAYS BEFORE ISSUING A FINAL NEGATIVE REPORT      Performed at [facility]   Report Status PENDING   Incomplete  CULTURE, BLOOD (ROUTINE X 2)     Status: None   Collection Time    10/01/13  6:05 PM      Result  Value Ref Range Status   Specimen Description BLOOD RIGHT HAND  2 ML IN AEROBIC ONLY   Final   Special Requests NONE   Final   Culture  Setup Time     Final   Value: 10/01/2013 22:09     Performed at [facility]   Culture     Final   Value:        BLOOD CULTURE RECEIVED NO GROWTH TO DATE CULTURE WILL BE HELD FOR 5 DAYS BEFORE ISSUING A FINAL NEGATIVE REPORT     Performed at [facility]   Report Status PENDING   Incomplete  MRSA PCR SCREENING     Status: Abnormal   Collection Time    10/01/13  8:59 PM      Result Value Ref Range Status   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 It is not     intended to diagnose MRSA     infection nor to guide or     monitor treatment for     MRSA infections.     RESULT CALLED TO, READ BACK BY AND VERIFIED WITH:     GARLAND,G/2W @2235  ON 10/01/13 BY KARCZEWSKI,S.  CULTURE, EXPECTORATED SPUTUM-ASSESSMENT     Status: None   Collection Time    10/04/13  9:30 AM      Result Value Ref Range Status   Specimen Description SPUTUM   Final   Special Requests NONE   Final   Sputum evaluation     Final   Value: THIS SPECIMEN IS ACCEPTABLE. RESPIRATORY CULTURE REPORT TO FOLLOW.   Report Status 10/04/2013 FINAL   Final     Radiographs/Studies:   Dg Chest 2 View  10/01/2013   CLINICAL DATA:  Shortness of breath. Asthma. Diabetes and hypertension. Rheumatoid arthritis.  EXAM: CHEST  2 VIEW  COMPARISON:  09/22/2013  FINDINGS: Bilateral upper lobe volume loss and pleural-parenchymal scarring is stable in appearance. Superior hilar retraction again seen bilaterally which is unchanged. No evidence  of acute or superimposed infiltrate. No evidence of pleural effusion. Heart size remains normal. A right-sided internal jugular central venous catheter remains in stable position.  IMPRESSION: Stable upper lobe predominant pleural-parenchymal scarring. No acute findings identified.   Electronically Signed   By: Myles Rosenthal M.D.   On: 10/01/2013 16:39   Medications:   . arformoterol  15 mcg Nebulization BID  . budesonide (PULMICORT) nebulizer solution  0.5 mg Nebulization BID  . Chlorhexidine Gluconate Cloth  6 each Topical Q0600  . ciprofloxacin  400 mg Intravenous Q12H  . feeding supplement (ENSURE COMPLETE)  237 mL Oral BID BM  . heparin  5,000 Units Subcutaneous 3 times per day  . imipenem-cilastatin  500 mg Intravenous 4 times per day  . insulin aspart  0-9 Units Subcutaneous 6 times per day  . levothyroxine  25 mcg Intravenous QAC breakfast  . methylPREDNISolone (SOLU-MEDROL) injection  40 mg Intravenous Q12H  . mupirocin ointment  1 application Nasal BID  . nystatin cream  1 application Topical BID  . potassium chloride  10 mEq Intravenous Q1 Hr x 5  . sodium chloride  3 mL Intravenous Q12H  . vancomycin  500 mg Intravenous Q12H  Continuous Infusions:   Time spent: 25 minutes.   LOS: 4 days   Maryruth Bun Rama  [facility] Pager [phone]. If unable to reach me by pager, please call my cell phone at [phone].  *Please refer to [email or web address], password [email or web address] to get updated schedule on who will round on this patient, as hospitalists switch teams weekly. If 7PM-7AM, please contact night-coverage at [email or web address], password [email or web address] for any overnight needs.  10/05/2013, 7:15 AM    **Disclaimer: This note was dictated with voice recognition software. Similar sounding words can inadvertently be transcribed and this note may contain transcription errors which may not have been corrected upon publication of note.**

## 2013-10-05 NOTE — Progress Notes (Signed)
CSW received notification from RN that pt husband at bedside and requesting a letter to provide to his employer.  CSW met with pt husband at bedside to gather information and provided letter for pt husband to provide to his work.   Support provided and pt family appreciative of assistance.  Per chart, pt with poor prognosis.  Please re-consult social work if further needs arise.  Alison Murray, MSW, [place] [employer] [phone]

## 2013-10-05 NOTE — Progress Notes (Signed)
Palliative Care Team at [facility] Progress Note   SUBJECTIVE: Awake, very weak, looks mildly distressed, very shallow respirations with congestion.  Interval Events: Admitted 4/18 with respiratory failure.  OBJECTIVE: Vital Signs: BP 124/84  Pulse 117  Temp(Src) 97.6 F (36.4 C) (Oral)  Resp 12  Ht 5\' 4"  (1.626 m)  Wt 80.5 kg (177 lb 7.5 oz)  BMI 30.45 kg/m2  SpO2 99%   Intake and Output: 04/21 0701 - 04/22 0700 In: 1540 [I.V.:240; IV Piggyback:1300] Out: 2295 [Urine:2295]  Physical Exam: General: Vital signs reviewed and noted.Responds with weak voice  Head: Normocephalic, atraumatic.  Resp Shallow respirations, full FM, +rhonchi  Heart: Tachycardic, S1 and S2 normal without gallop,  or rubs. (+) murmur  Abdomen:  BS normoactive. Soft, Nondistended, non-tender.  No masses or organomegaly.  Extremities: No pretibial edema.    Allergies  Allergen Reactions  . Chantix [Varenicline] Other (See Comments)    Unknown " blurry vision"  . Hctz [Hydrochlorothiazide] Other (See Comments)    unknown  . Penicillins Rash  . Sulfonamide Derivatives Rash  . Tetanus Toxoid Other (See Comments)    Knot on arm    Medications: Scheduled Meds:  . arformoterol  15 mcg Nebulization BID  . budesonide (PULMICORT) nebulizer solution  0.5 mg Nebulization BID  . Chlorhexidine Gluconate Cloth  6 each Topical Q0600  . ciprofloxacin  400 mg Intravenous Q12H  . imipenem-cilastatin  500 mg Intravenous 3 times per day  . [START ON 10/06/2013] methylPREDNISolone (SOLU-MEDROL) injection  60 mg Intravenous Q24H  . mupirocin ointment  1 application Nasal BID  . nystatin cream  1 application Topical BID  . sodium chloride  3 mL Intravenous Q12H      PRN Meds: albuterol, lip balm, LORazepam, morphine injection  Stool Softner: yes  Palliative Performance Scale: 20 %   Pain Present?: no    Labs: CBC    Component Value Date/Time   WBC 9.1 10/04/2013 0500   RBC 3.55* 10/04/2013 0500   HGB 8.6* 10/04/2013 0500   HCT 28.0* 10/04/2013 0500   PLT 134* 10/04/2013 0500   MCV 78.9 10/04/2013 0500   MCH 24.2* 10/04/2013 0500   MCHC 30.7 10/04/2013 0500   RDW 24.7* 10/04/2013 0500   LYMPHSABS 0.6* 10/02/2013 0355   MONOABS 1.0 10/02/2013 0355   EOSABS 0.0 10/02/2013 0355   BASOSABS 0.0 10/02/2013 0355    CMET     Component Value Date/Time   NA 144 10/05/2013 0313   K 2.9* 10/05/2013 0313   CL 98 10/05/2013 0313   CO2 33* 10/05/2013 0313   GLUCOSE 126* 10/05/2013 0313   BUN 18 10/05/2013 0313   CREATININE 0.95 10/05/2013 0313   CREATININE 0.90 09/14/2013 1052   CALCIUM 9.4 10/05/2013 0313   PROT 5.6* 10/03/2013 0620   ALBUMIN 2.1* 10/03/2013 0620   AST 16 10/03/2013 0620   ALT 17 10/03/2013 0620   ALKPHOS 106 10/03/2013 0620   BILITOT 0.2* 10/03/2013 0620   GFRNONAA 63* 10/05/2013 0313   GFRNONAA 69 09/14/2013 1052   GFRAA 73* 10/05/2013 0313   GFRAA 79 09/14/2013 1052     Imaging: Reviewed.  ASSESSMENT/ PLAN: 63 YO woman actively dying of respiratory failure in setting of advanced COPD. Family gathered at bedisde following goals of care discussion with PMT yesterday. Everyone embracing Full comfort care approach. Patient has required few PRNs by her request but I am concerned about her rising CO2 levels and her mentation-she is able to answer softly, and in  very short phrases-and barely moves in bed from severe dyspnea. She looks anxious and her energy is definitely directed towards only breathing. I discussed next steps and plan with her daughter and husband to make complete comfort transition including move from unit, possible scheduled morphine and ativan for comfort and stopping antibiotics. Daughter requested status quo for now and continue only PRNs since it is her mother's sister's birthday and they are worried that if she dies it will have a very negative impact on the family. I agreed to continued PRNs as long as patient remained comfortable.   Full comfort  Discontinue blood draws,  CBGs and lab monitoring  Stop Vanc, leave Primaxin for today and discontinue tomorrow AM.  Transfer patient to floor in AM  Comfort Cart for family if available.  Chaplain consult    Time In: 1200 Time Out: 1250 Total Time Spent with Patient: 50 Total Overall Time: 50   Greater than 50%  of this time was spent counseling and coordinating care related to the above assessment and plan.   Edsel Petrin, DO  10/05/2013, 12:35 PM  Please contact Palliative Medicine Team phone at [phone] for questions and concerns.

## 2013-10-05 NOTE — Progress Notes (Signed)
.     Name: Jacqueline Washington MRN: [id number] DOB: 1950-08-13    ADMISSION DATE:  10/01/2013 CONSULTATION DATE:  4/20  REFERRING MD :  Rito Ehrlich  PRIMARY SERVICE:  [facility]   CHIEF COMPLAINT:   Acute on chronic respiratory failure  Possible sepsis  Failure to thrive    BRIEF PATIENT DESCRIPTION:  63 year old female, well known to [place] in setting of / chronic resp failure (pred and O2 dep (3-4 L), currently  30mg /d pred in setting of rheumatoid related pulmonary fibrosis, further c/b acute decompensations as a result of underlying emphysema and BTX, marked by recurrent bouts of what we have labeled as HCAPs (NOS) and/or recurrent bouts of  pneumonitis and bronchiolitis obliterans organized Pneumonia. Most recently discharged 3/26 after full course of ABX. Presents to ER again on 4/18 w/ CC: persistent and progressive weakness and findings c/w failure to thrive. PCCM asked to assist w/ her care.   SIGNIFICANT EVENTS / STUDIES:  feb/March 2015 we treated her empirically for CT findings c/w fungal ball w/VFEND. Her voriconazole was subsequently discontinued on March 17 as she was thought not to have aspergillosis. 3/26 discharged s/p rx for HCAP (NOS) 4/9: seen in our office w/ cc: weakness/ breathing at baseline   LINES / TUBES: PICC 2/26>>>  CULTURES: bcx2 4/18>>> MRSA screen 4/18: positive   ANTIBIOTICS: vanc 4/18>>> primaxin 4/18>>> cipro 4/18>>>  SUBJECTIVE:  C/o weakness  VITAL SIGNS: Temp:  [98.3 F (36.8 C)-98.7 F (37.1 C)] 98.7 F (37.1 C) (04/21 2011) Pulse Rate:  [117-132] 130 (04/22 0102) Resp:  [15-27] 22 (04/22 0102) BP: (132-143)/(78-87) 132/87 mmHg (04/22 0102) SpO2:  [97 %-100 %] 98 % (04/22 0739) FiO2 (%):  [50 %-55 %] 50 % (04/22 0739)  PHYSICAL EXAMINATION: General:  Chronically, remains terminally ill appearing. + accessory muscle use  Neuro:  Profoundly weak, oriented x 4 HEENT:  [place], no JVD Cardiovascular:  rrr Lungs:  Diffuse coarse rhonchi, +  accessory muscle use  Abdomen:  Soft, no oM  Musculoskeletal:  Generalized weakness Skin:  Anasarca, scattered ecchymosis    Recent Labs Lab 10/03/13 0620 10/04/13 0500 10/05/13 0313  NA 142 138 144  K 3.4* 4.1 2.9*  CL 102 98 98  CO2 27 28 33*  BUN 18 17 18   CREATININE 0.79 0.80 0.95  GLUCOSE 84 107* 126*    Recent Labs Lab 10/02/13 0355 10/03/13 0620 10/04/13 0500  HGB 9.1* 9.0* 8.6*  HCT 29.9* 29.5* 28.0*  WBC 15.9* 13.3* 9.1  PLT 149* 149* 134*   No results found.  ASSESSMENT / PLAN:  1) Chronic respiratory failure in setting of rheumatoid related ILD, COPD (GOLD C) and bronchiectasis  Recurrent pneumonias/ bronchiectasis flares:  Given the timeline of clinical deterioration typically days after completion of antibiotics raises concern about bacterial colonization and recurrent infections due to her bronchiectasis. Certainly aspiration on diff dx, but probably more of a complicating factor than the reason she gets worse.  2) Recurrent sepsis: source is likely lungs but sacral decub and PICC also on diff dx 3) H/o diastolic dysfxn 4) AKI -->improved w/ IVFs 5) Stage II CKD 6) Type 2 NIDDM 7) Failure to thrive.   Discussion Notes from palliative reviewed. Appreciate assistance. She has had a rough night and required some PRN morphine. Now focusing on symptom management.   Recs: -Continue empiric abx, when antibiotics complete could consider sending her home on prophylactic dosing of azithromycin 250mg  daily, if she were to improve, but w/ progressive  decline do not think she will survive this hospital stay  -Cont bronchial hygiene measures, bronchodilators, lasix as tolerated and systemic steroids for now.  -would certainly be in favor w/ stopping all of the above and focusing only on comfort.  -she has requested no further blood draws if can't be obtained from PICC  Simonne Martinet, NP  Patient is currently actively dying, I do not foresee her surviving this  admission.  I reviewed palliative care notes.  The patient is realistically more focused on symptom management and has started morphine IV pushes last night.  Will continue palliation for now, ok to move out of the ICU to regular bed.  Patient is a long established patient of our practice and we will continue to follow peripherally.  I spoke with patient, she really is more focused on symptom management and more amenable to hospice assuming she gets discharged home.  Patient seen and examined, agree with above note.  I dictated the care and orders written for this patient under my direction.  Alyson Reedy, MD [phone]

## 2013-10-06 DIAGNOSIS — L89109 Pressure ulcer of unspecified part of back, unspecified stage: Secondary | ICD-10-CM

## 2013-10-06 MED ORDER — SCOPOLAMINE 1 MG/3DAYS TD PT72
1.0000 | MEDICATED_PATCH | TRANSDERMAL | Status: DC
  Administered 2013-10-06: 1.5 mg via TRANSDERMAL
  Filled 2013-10-06 (×2): qty 1

## 2013-10-06 NOTE — Progress Notes (Signed)
.       Name: Jacqueline Washington MRN: [id number] DOB: May 15, 1951    ADMISSION DATE:  10/01/2013 CONSULTATION DATE:  4/20  REFERRING MD :  Rito Ehrlich  PRIMARY SERVICE:  [facility]   CHIEF COMPLAINT:   Acute on chronic respiratory failure  Possible sepsis  Failure to thrive    BRIEF PATIENT DESCRIPTION:  63 year old female, well known to [place] in setting of / chronic resp failure (pred and O2 dep (3-4 L), currently  30mg /d pred in setting of rheumatoid related pulmonary fibrosis, further c/b acute decompensations as a result of underlying emphysema and BTX, marked by recurrent bouts of what we have labeled as HCAPs (NOS) and/or recurrent bouts of  pneumonitis and bronchiolitis obliterans organized Pneumonia. Most recently discharged 3/26 after full course of ABX. Presents to ER again on 4/18 w/ CC: persistent and progressive weakness and findings c/w failure to thrive. PCCM asked to assist w/ her care.   SIGNIFICANT EVENTS / STUDIES:  feb/March 2015 we treated her empirically for CT findings c/w fungal ball w/VFEND. Her voriconazole was subsequently discontinued on March 17 as she was thought not to have aspergillosis. 3/26 discharged s/p rx for HCAP (NOS) 4/9: seen in our office w/ cc: weakness/ breathing at baseline   LINES / TUBES: PICC 2/26>>>  CULTURES: bcx2 4/18>>> MRSA screen 4/18: positive  Sputum 4/21: abundant GNR>>> ANTIBIOTICS: vanc 4/18>>>4/22 primaxin 4/18>>>4/22 cipro 4/18>>>4/22  SUBJECTIVE:  Appears comfortable  VITAL SIGNS: Pulse Rate:  [116-136] 131 (04/23 0735) Resp:  [11-31] 15 (04/23 0735) BP: (136)/(98) 136/98 mmHg (04/22 1510) SpO2:  [86 %-100 %] 98 % (04/23 0735) FiO2 (%):  [50 %-55 %] 55 % (04/22 2000)  PHYSICAL EXAMINATION: General:  Chronically, remains terminally ill appearing. + accessory muscle use  Neuro:  Profoundly weak, oriented x 4 HEENT:  [place], no JVD Cardiovascular:  rrr Lungs:  Diffuse coarse rhonchi, + accessory muscle use  Abdomen:  Soft, no  oM  Musculoskeletal:  Generalized weakness Skin:  Anasarca, scattered ecchymosis    Recent Labs Lab 10/03/13 0620 10/04/13 0500 10/05/13 0313  NA 142 138 144  K 3.4* 4.1 2.9*  CL 102 98 98  CO2 27 28 33*  BUN 18 17 18   CREATININE 0.79 0.80 0.95  GLUCOSE 84 107* 126*    Recent Labs Lab 10/02/13 0355 10/03/13 0620 10/04/13 0500  HGB 9.1* 9.0* 8.6*  HCT 29.9* 29.5* 28.0*  WBC 15.9* 13.3* 9.1  PLT 149* 149* 134*   No results found.  ASSESSMENT / PLAN:  1) Chronic respiratory failure in setting of rheumatoid related ILD, COPD (GOLD C) and bronchiectasis  Recurrent pneumonias/ bronchiectasis flares 2) Recurrent sepsis: source is likely lungs 3) H/o diastolic dysfxn 4) AKI -->improved w/ IVFs 5) Stage II CKD 6) Type 2 NIDDM 7) Failure to thrive.  8) actively dying  Discussion Notes from palliative reviewed. Appreciate assistance. Appears comfortable on current rx   Recs: -agree w/ current comfort oriented focus -nothing further for 10/05/13 to offer clinically -will s/o and be available AS NEEDED.   Patient seen and examined, agree with above note.  I dictated the care and orders written for this patient under my direction.  10/06/13, MD [phone]

## 2013-10-06 NOTE — Progress Notes (Signed)
Progress Note   Jacqueline Washington GBE:[id number] DOB: 1951-02-26 DOA: 10/01/2013 PCP: Nadean Corwin, MD   Brief Narrative:   Jacqueline Washington is an 63 y.o. female with a PMH of end-stage pulmonary disease, chronic respiratory failure on 3 L of home oxygen, COPD, recurrent pneumonias who was admitted on 10/01/13 with generalized weakness, failure to thrive, and sepsis with acute renal failure. Despite aggressive therapy with antibiotics, her respiratory status has deteriorated and she required BiPAP which was weaned to a Ventimask on 10/04/13. She is now being followed by the palliative care team and is not expected to survive this hospital stay.  Assessment/Plan:   Principal Problem:   Severe sepsis with acute organ dysfunction / healthcare associated pneumonia  Although initial chest x-ray clear, source of sepsis felt to be from lungs in the setting of bronchiectasis/COPD.  Blood cultures negative to date, sputum culture pending (but growing GNR).  D/C antibiotics as recommended by palliative care team.  Patient not expected to survive this hospital stay, and appears to be actively dying at this point. Active Problems:   Copd Gold C / acute and chronic respiratory failure / bronchiectasis with acute exacerbation / DO NOT RESUSCITATE  Continue supplemental oxygen for comfort.  Continue albuterol, Brovana, empiric antibiotics and steroids. Currently at 60 mg of Solu-Medrol 12 hours.   Acute on chronic diastolic congestive heart failure, NYHA class 1  Continue supplemental oxygen and morphine as needed for air hunger.   Hypothyroidism  Synthroid discontinued.   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   Protein-calorie malnutrition, severe / Obesity (BMI 30-39.9)  Seen by dietitian, recommendations noted.  No oral intake.   Rheumatoid arthritis(714.0)  On chronic steroids prior to admission, currently on Solu-Medrol.  Hypertension  Lasix discontinued.   AKI (acute kidney injury)  Creatinine improved with IV fluids  No further lab draws.   Hyperkalemia / hypokalemia  No further lab draws, initially hyperkalemia, then hypokalemic requiring supplementation.   Sacral decubitus ulcer  Seen by wound care nurse 10/03/13. Continue wound care per her recommendations.   Normocytic anemia  Likely anemia of chronic disease. No further lab draws.   DVT Prophylaxis  Subcutaneous heparin discontinued in light of full comfort measures.  Code Status: DNR Family Communication: No family at bedside. Disposition Plan: Not expected to survive this hospital stay.   IV Access:    PICC line   Procedures:    None.   Medical Consultants:    Dr. Derenda Mis, Palliative Care  Dr. Koren Bound, PCCM   Other Consultants:    Speech therapy  Dietitian  Wound care nurse   Anti-Infectives:    Vancomycin 10/01/13---> 10/05/13  Imipenem 10/01/13---> 10/06/13  Cipro 10/02/13---> 10/06/13  Subjective:   Jacqueline Washington is weak and lethargic, but resting comfortably.  Her condition has deteriorated and prognosis is likely hours-days.  Objective:    Filed Vitals:   10/05/13 1954 10/05/13 2000 10/06/13 0000 10/06/13 0400  BP:      Pulse:  125 136 125  Temp:      TempSrc:      Resp:  20 27 19   Height:      Weight:      SpO2: 98% 97% 86% 98%    Intake/Output Summary (Last 24 hours) at 10/06/13 0703 Last data filed at 10/05/13 2143  Gross per 24 hour  Intake   1010 ml  Output    500  ml  Net    510 ml    Exam: Gen:  Unresponsive Cardiovascular:  Tachycardic, No M/R/G Respiratory:  Lungs with increased scattered rales, diminished bases Gastrointestinal:  Abdomen soft, NT/ND, + BS Extremities:  + Edema/Anasarca to upper and lower extremities   Data Reviewed:    Labs: Basic Metabolic Panel:  Recent Labs Lab 10/01/13 1630  10/02/13 0355  10/03/13 0620 10/04/13 0500  10/05/13 0313  NA 135*  --  141  --  142 138 144  K 5.9*  < > 3.8  < > 3.4* 4.1 2.9*  CL 94*  --  102  --  102 98 98  CO2 26  --  27  --  27 28 33*  GLUCOSE 185*  --  103*  --  84 107* 126*  BUN 35*  --  26*  --  18 17 18   CREATININE 1.17*  --  0.84  --  0.79 0.80 0.95  CALCIUM 10.6*  --  9.0  --  9.6 9.5 9.4  MG  --   --   --   --  1.5  --   --   PHOS  --   --   --   --  3.0  --   --   < > = values in this interval not displayed. GFR Estimated Creatinine Clearance: 63 ml/min (by C-G formula based on Cr of 0.95). Liver Function Tests:  Recent Labs Lab 10/01/13 1630 10/03/13 0620  AST 17 16  ALT 19 17  ALKPHOS 128* 106  BILITOT 0.3 0.2*  PROT 7.1 5.6*  ALBUMIN 2.6* 2.1*    Recent Labs Lab 10/01/13 1630  LIPASE 27   CBC:  Recent Labs Lab 10/01/13 1630 10/02/13 0355 10/03/13 0620 10/04/13 0500  WBC 23.4* 15.9* 13.3* 9.1  NEUTROABS 22.0* 14.3*  --   --   HGB 11.3* 9.1* 9.0* 8.6*  HCT 36.8 29.9* 29.5* 28.0*  MCV 78.8 79.3 79.1 78.9  PLT 223 149* 149* 134*   Cardiac Enzymes:  Recent Labs Lab 10/01/13 1630  TROPONINI <0.30   BNP (last 3 results)  Recent Labs  04/14/13 2300 08/10/13 1554 10/01/13 1630  PROBNP 301.6* 1099.0* 1056.0*   CBG:  Recent Labs Lab 10/04/13 1133 10/04/13 1539 10/04/13 2045 10/05/13 0051 10/05/13 0823  GLUCAP 111* 123* 143* 114* 136*   Sepsis Labs:  Recent Labs Lab 10/01/13 1630 10/01/13 1746 10/02/13 0355 10/03/13 0620 10/04/13 0500  WBC 23.4*  --  15.9* 13.3* 9.1  LATICACIDVEN  --  2.55*  --   --   --    Microbiology Recent Results (from the past 240 hour(s))  CULTURE, BLOOD (ROUTINE X 2)     Status: None   Collection Time    10/01/13  5:50 PM      Result Value Ref Range Status   Specimen Description BLOOD PICC  10 ML IN [facility] BOTTLE   Final   Special Requests NONE   Final   Culture  Setup Time     Final   Value: 10/01/2013 22:09     Performed at [facility]   Culture     Final   Value:         BLOOD CULTURE RECEIVED NO GROWTH TO DATE CULTURE WILL BE HELD FOR 5 DAYS BEFORE ISSUING A FINAL NEGATIVE REPORT     Performed at [facility]   Report Status PENDING   Incomplete  CULTURE, BLOOD (ROUTINE X 2)  Status: None   Collection Time    10/01/13  6:05 PM      Result Value Ref Range Status   Specimen Description BLOOD RIGHT HAND  2 ML IN AEROBIC ONLY   Final   Special Requests NONE   Final   Culture  Setup Time     Final   Value: 10/01/2013 22:09     Performed at [facility]   Culture     Final   Value:        BLOOD CULTURE RECEIVED NO GROWTH TO DATE CULTURE WILL BE HELD FOR 5 DAYS BEFORE ISSUING A FINAL NEGATIVE REPORT     Performed at [facility]   Report Status PENDING   Incomplete  MRSA PCR SCREENING     Status: Abnormal   Collection Time    10/01/13  8:59 PM      Result Value Ref Range Status   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 It is not     intended to diagnose MRSA     infection nor to guide or     monitor treatment for     MRSA infections.     RESULT CALLED TO, READ BACK BY AND VERIFIED WITH:     GARLAND,G/2W @2235  ON 10/01/13 BY KARCZEWSKI,S.  CULTURE, EXPECTORATED SPUTUM-ASSESSMENT     Status: None   Collection Time    10/04/13  9:30 AM      Result Value Ref Range Status   Specimen Description SPUTUM   Final   Special Requests NONE   Final   Sputum evaluation     Final   Value: THIS SPECIMEN IS ACCEPTABLE. RESPIRATORY CULTURE REPORT TO FOLLOW.   Report Status 10/04/2013 FINAL   Final  CULTURE, RESPIRATORY (NON-EXPECTORATED)     Status: None   Collection Time    10/04/13  9:30 AM      Result Value Ref Range Status   Specimen Description SPUTUM   Final   Special Requests NONE   Final   Gram Stain     Final   Value: FEW WBC PRESENT,  PREDOMINANTLY PMN     NO SQUAMOUS EPITHELIAL CELLS SEEN     ABUNDANT GRAM NEGATIVE RODS     Performed at 10/06/13   Culture     Final   Value: Culture reincubated for better growth     Performed at [facility]   Report Status PENDING   Incomplete     Radiographs/Studies:   Dg Chest 2 View  10/01/2013   CLINICAL DATA:  Shortness of breath. Asthma. Diabetes and hypertension. Rheumatoid arthritis.  EXAM: CHEST  2 VIEW  COMPARISON:  09/22/2013  FINDINGS: Bilateral upper lobe volume loss and pleural-parenchymal scarring is stable in appearance. Superior hilar retraction again seen bilaterally which is unchanged. No evidence of acute or superimposed infiltrate. No evidence of pleural effusion. Heart size remains normal. A right-sided internal jugular central venous catheter remains in stable position.  IMPRESSION: Stable upper lobe predominant pleural-parenchymal scarring. No acute findings identified.   Electronically Signed   By: 11/22/2013 M.D.   On: 10/01/2013 16:39   Medications:   . arformoterol  15 mcg Nebulization  BID  . budesonide (PULMICORT) nebulizer solution  0.5 mg Nebulization BID  . Chlorhexidine Gluconate Cloth  6 each Topical Q0600  . ciprofloxacin  400 mg Intravenous Q12H  . imipenem-cilastatin  500 mg Intravenous 3 times per day  . methylPREDNISolone (SOLU-MEDROL) injection  60 mg Intravenous Q24H  . mupirocin ointment  1 application Nasal BID  . nystatin cream  1 application Topical BID  . scopolamine  1 patch Transdermal Q72H  . sodium chloride  3 mL Intravenous Q12H   Continuous Infusions:   Time spent: 25 minutes.   LOS: 5 days   Maryruth Bun Brandolyn Shortridge  [facility] Pager [phone]. If unable to reach me by pager, please call my cell phone at [phone].  *Please refer to [email or web address], password [email or web address] to get updated schedule on who will round on this patient, as hospitalists switch teams weekly. If 7PM-7AM, please contact night-coverage at  [email or web address], password [email or web address] for any overnight needs.  10/06/2013, 7:03 AM    **Disclaimer: This note was dictated with voice recognition software. Similar sounding words can inadvertently be transcribed and this note may contain transcription errors which may not have been corrected upon publication of note.**

## 2013-10-07 ENCOUNTER — Inpatient Hospital Stay (HOSPITAL_COMMUNITY)
Admission: AD | Admit: 2013-10-07 | Discharge: 2013-10-14 | DRG: 194 | Disposition: E | Source: Ambulatory Visit | Attending: Internal Medicine | Admitting: Internal Medicine

## 2013-10-07 DIAGNOSIS — Z66 Do not resuscitate: Secondary | ICD-10-CM | POA: Diagnosis present

## 2013-10-07 DIAGNOSIS — Z880 Allergy status to penicillin: Secondary | ICD-10-CM

## 2013-10-07 DIAGNOSIS — E039 Hypothyroidism, unspecified: Secondary | ICD-10-CM | POA: Diagnosis present

## 2013-10-07 DIAGNOSIS — E1129 Type 2 diabetes mellitus with other diabetic kidney complication: Secondary | ICD-10-CM

## 2013-10-07 DIAGNOSIS — Z8249 Family history of ischemic heart disease and other diseases of the circulatory system: Secondary | ICD-10-CM

## 2013-10-07 DIAGNOSIS — Z9981 Dependence on supplemental oxygen: Secondary | ICD-10-CM

## 2013-10-07 DIAGNOSIS — Z882 Allergy status to sulfonamides status: Secondary | ICD-10-CM

## 2013-10-07 DIAGNOSIS — Y846 Urinary catheterization as the cause of abnormal reaction of the patient, or of later complication, without mention of misadventure at the time of the procedure: Secondary | ICD-10-CM | POA: Diagnosis not present

## 2013-10-07 DIAGNOSIS — R339 Retention of urine, unspecified: Secondary | ICD-10-CM | POA: Diagnosis not present

## 2013-10-07 DIAGNOSIS — E669 Obesity, unspecified: Secondary | ICD-10-CM | POA: Diagnosis present

## 2013-10-07 DIAGNOSIS — Z825 Family history of asthma and other chronic lower respiratory diseases: Secondary | ICD-10-CM

## 2013-10-07 DIAGNOSIS — J9620 Acute and chronic respiratory failure, unspecified whether with hypoxia or hypercapnia: Secondary | ICD-10-CM

## 2013-10-07 DIAGNOSIS — J189 Pneumonia, unspecified organism: Principal | ICD-10-CM | POA: Diagnosis present

## 2013-10-07 DIAGNOSIS — Z515 Encounter for palliative care: Secondary | ICD-10-CM

## 2013-10-07 DIAGNOSIS — Z87891 Personal history of nicotine dependence: Secondary | ICD-10-CM

## 2013-10-07 DIAGNOSIS — IMO0002 Reserved for concepts with insufficient information to code with codable children: Secondary | ICD-10-CM | POA: Diagnosis present

## 2013-10-07 DIAGNOSIS — E785 Hyperlipidemia, unspecified: Secondary | ICD-10-CM | POA: Diagnosis present

## 2013-10-07 DIAGNOSIS — M069 Rheumatoid arthritis, unspecified: Secondary | ICD-10-CM | POA: Diagnosis present

## 2013-10-07 DIAGNOSIS — E559 Vitamin D deficiency, unspecified: Secondary | ICD-10-CM | POA: Diagnosis present

## 2013-10-07 DIAGNOSIS — J449 Chronic obstructive pulmonary disease, unspecified: Secondary | ICD-10-CM | POA: Diagnosis present

## 2013-10-07 DIAGNOSIS — E119 Type 2 diabetes mellitus without complications: Secondary | ICD-10-CM | POA: Diagnosis present

## 2013-10-07 DIAGNOSIS — J4489 Other specified chronic obstructive pulmonary disease: Secondary | ICD-10-CM | POA: Diagnosis present

## 2013-10-07 DIAGNOSIS — I10 Essential (primary) hypertension: Secondary | ICD-10-CM | POA: Diagnosis present

## 2013-10-07 DIAGNOSIS — N9989 Other postprocedural complications and disorders of genitourinary system: Secondary | ICD-10-CM | POA: Diagnosis not present

## 2013-10-07 DIAGNOSIS — K5900 Constipation, unspecified: Secondary | ICD-10-CM

## 2013-10-07 DIAGNOSIS — Z833 Family history of diabetes mellitus: Secondary | ICD-10-CM

## 2013-10-07 DIAGNOSIS — I5032 Chronic diastolic (congestive) heart failure: Secondary | ICD-10-CM | POA: Diagnosis present

## 2013-10-07 DIAGNOSIS — J471 Bronchiectasis with (acute) exacerbation: Secondary | ICD-10-CM

## 2013-10-07 DIAGNOSIS — Z888 Allergy status to other drugs, medicaments and biological substances status: Secondary | ICD-10-CM

## 2013-10-07 DIAGNOSIS — J9610 Chronic respiratory failure, unspecified whether with hypoxia or hypercapnia: Secondary | ICD-10-CM | POA: Diagnosis present

## 2013-10-07 LAB — CULTURE, BLOOD (ROUTINE X 2)
Culture: NO GROWTH
Culture: NO GROWTH

## 2013-10-07 MED ORDER — MORPHINE SULFATE 2 MG/ML IJ SOLN: 2.0000 mg | INTRAMUSCULAR | Status: DC | PRN

## 2013-10-07 MED ORDER — METHYLPREDNISOLONE SODIUM SUCC 40 MG IJ SOLR
40.0000 mg | INTRAMUSCULAR | Status: DC
  Administered 2013-10-07: 40 mg via INTRAVENOUS
  Filled 2013-10-07: qty 1

## 2013-10-07 MED ORDER — METHYLPREDNISOLONE SODIUM SUCC 40 MG IJ SOLR: 40.0000 mg | INTRAMUSCULAR | Status: DC

## 2013-10-07 MED ORDER — SODIUM CHLORIDE 0.9 % IV SOLN
0.5000 mg/h | INTRAVENOUS | Status: DC
  Administered 2013-10-08: 0.5 mg/h via INTRAVENOUS
  Filled 2013-10-07 (×2): qty 10

## 2013-10-07 MED ORDER — MORPHINE SULFATE 10 MG/ML IJ SOLN
0.5000 mg/h | INTRAMUSCULAR | Status: DC
  Administered 2013-10-07: 0.5 mg/h via INTRAVENOUS
  Filled 2013-10-07: qty 10

## 2013-10-07 MED ORDER — IPRATROPIUM-ALBUTEROL 0.5-2.5 (3) MG/3ML IN SOLN: 3.0000 mL | RESPIRATORY_TRACT | Status: DC | PRN

## 2013-10-07 MED ORDER — MORPHINE BOLUS VIA INFUSION
2.0000 mg | INTRAVENOUS | Status: DC | PRN
  Filled 2013-10-07: qty 2

## 2013-10-07 MED ORDER — LORAZEPAM 2 MG/ML IJ SOLN
1.0000 mg | INTRAMUSCULAR | Status: DC | PRN
  Administered 2013-10-07: 1 mg via INTRAVENOUS
  Filled 2013-10-07: qty 1

## 2013-10-07 MED ORDER — SCOPOLAMINE 1 MG/3DAYS TD PT72
1.0000 | MEDICATED_PATCH | TRANSDERMAL | Status: DC
  Filled 2013-10-07: qty 1

## 2013-10-07 MED ORDER — CHLORHEXIDINE GLUCONATE CLOTH 2 % EX PADS: 6.0000 | MEDICATED_PAD | Freq: Every day | CUTANEOUS | Status: DC

## 2013-10-07 NOTE — Progress Notes (Addendum)
Inpatient [facility] [address], [facility] [place], [place] Visit  Request recvd from [facility] for evaluation for GIP level of care eligibility; pt has been seen by PMT MD,  prognosis hours to days. Pt seen with Danton Sewer RN at bedside approximately 10:30am, chart and pt information reviewed with [place] confirmed. Related GIP admission [facility] diagnosis COPD (491.20). pt is DNR code status. At time of visit pt unresponsive to  voice or light touch: Morphine gtt at 0.$Remove'5mg'TyeNdsO$ /hr continuous, had been initiated and adjusted last evening for  increased respiratory distress. Pt with increased upper respiratory secretions,  RR=18, w/5 second periods  of apnea noted with use of abdominal muscles noted; scopolamine patch in place. UE edema, hands warm  bilateral toes mottling noted. [facility] Staff RN closly monitoring for effectiveness of symptom mangement  medications. Multiple family members at bedside, family shared that pt has been declining for months, and while this was not unexpected they have great sadness and feel they are supportive of one and other and relying on the strong faith. Emotional support offered. At the request of daughter Inocente Salles, hospital chaplains visit requested and information given regarding [facility] program. Met separately with patient's daughter Ramonita Lab and patient's husband Truman Hayward to review in detail Hospice GIP services, questions answered and consents signed. Family in agreement for [facility] to follow daily along with attending MD Dr. Hilma Favors.   Family shared that they have funeral arrangements with [facility].  Please notify attending Dr.Golding [phone]) at time of death Please also notify [facility] at [phone] at time of death or with any Hospice needs  Thank you Flo Shanks RN, BSN, [place] Liaison

## 2013-10-07 NOTE — Progress Notes (Signed)
Progress Note   Jacqueline Washington KYH:[id number] DOB: 19-Apr-1951 DOA: 10/01/2013 PCP: Nadean Corwin, MD   Brief Narrative:   Jacqueline Washington is an 63 y.o. female with a PMH of end-stage pulmonary disease, chronic respiratory failure on 3 L of home oxygen, COPD, recurrent pneumonias who was admitted on 10/01/13 with generalized weakness, failure to thrive, and sepsis with acute renal failure. Despite aggressive therapy with antibiotics, her respiratory status has deteriorated and she required BiPAP which was weaned to a Ventimask on 10/04/13. She is now receiving full comfort care and is not expected to survive this hospital stay.  Assessment/Plan:   Principal Problem:   Severe sepsis with acute organ dysfunction / healthcare associated pneumonia  Although initial chest x-ray clear, source of sepsis felt to be from lungs in the setting of bronchiectasis/COPD.  Blood cultures negative to date, sputum culture pending (but growing GNR).  Antibiotics discontinued 10/06/13 as goals of care now full comfort.  Patient not expected to survive this hospital stay, and appears to be actively dying at this point. Active Problems:   Copd Gold C / acute and chronic respiratory failure / bronchiectasis with acute exacerbation / DO NOT RESUSCITATE  Continue supplemental oxygen for comfort. Now on a morphine drip.  In light of full comfort measures, the patient is receiving duonebs and Solu-Medrol, all other respiratory therapies have been discontinued.   Acute on chronic diastolic congestive heart failure, NYHA class 1  Continue supplemental oxygen and morphine drip.   Hypothyroidism  Synthroid discontinued.   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   Protein-calorie malnutrition, severe / Obesity (BMI 30-39.9)  No oral intake.   Rheumatoid arthritis(714.0)  On chronic steroids prior to admission, currently on Solu-Medrol.    Hypertension  Lasix discontinued.   AKI (acute kidney injury)  No further lab draws.   Hyperkalemia / hypokalemia  No further lab draws, initially hyperkalemia, then hypokalemic requiring supplementation.   Sacral decubitus ulcer  Seen by wound care nurse 10/03/13. Continue wound care per her recommendations.   Normocytic anemia  Likely anemia of chronic disease. No further lab draws.   DVT Prophylaxis  Subcutaneous heparin discontinued in light of full comfort measures.  Code Status: DNR Family Communication: Husband, daughter and sister updated at bedside. Disposition Plan: Not expected to survive this hospital stay. Transition to GIP status.   IV Access:    PICC line   Procedures:    None.   Medical Consultants:    Dr. Julaine Fusi, Palliative Care  Dr. Koren Bound, PCCM   Other Consultants:    Speech therapy  Dietitian  Wound care nurse   Anti-Infectives:    Vancomycin 10/01/13---> 10/05/13  Imipenem 10/01/13---> 10/06/13  Cipro 10/02/13---> 10/06/13  Subjective:   Jacqueline Washington has not been responsive since yesterday. She occasionally has some restlessness, but overall seems comfortable. Her breathing is a bit agonal.  Objective:    Filed Vitals:   10/06/13 1200 10/06/13 1448 10/06/13 2225 10-18-13 0535  BP:  146/78 118/91 119/72  Pulse: 130 128 98 127  Temp:  97.6 F (36.4 C) 98.6 F (37 C) 97.7 F (36.5 C)  TempSrc:  Oral  Axillary  Resp: 27 22 16 20   Height:      Weight:      SpO2: 98% 98% 99% 100%    Intake/Output Summary (Last 24 hours) at 10/18/13 0806 Last data filed at 10/06/13 1300  Gross  per 24 hour  Intake    200 ml  Output    120 ml  Net     80 ml    Exam: Gen:  Unresponsive Cardiovascular:  Tachycardic, No M/R/G Respiratory:  Lungs with scattered rales, diminished bases, agonal breathing Gastrointestinal:  Abdomen soft, NT/ND, + BS Extremities:  + Edema/Anasarca to upper and lower extremities   Data  Reviewed:    Labs: Basic Metabolic Panel:  Recent Labs Lab 10/01/13 1630  10/02/13 0355  10/03/13 0620 10/04/13 0500 10/05/13 0313  NA 135*  --  141  --  142 138 144  K 5.9*  < > 3.8  < > 3.4* 4.1 2.9*  CL 94*  --  102  --  102 98 98  CO2 26  --  27  --  27 28 33*  GLUCOSE 185*  --  103*  --  84 107* 126*  BUN 35*  --  26*  --  18 17 18   CREATININE 1.17*  --  0.84  --  0.79 0.80 0.95  CALCIUM 10.6*  --  9.0  --  9.6 9.5 9.4  MG  --   --   --   --  1.5  --   --   PHOS  --   --   --   --  3.0  --   --   < > = values in this interval not displayed. GFR Estimated Creatinine Clearance: 63 ml/min (by C-G formula based on Cr of 0.95). Liver Function Tests:  Recent Labs Lab 10/01/13 1630 10/03/13 0620  AST 17 16  ALT 19 17  ALKPHOS 128* 106  BILITOT 0.3 0.2*  PROT 7.1 5.6*  ALBUMIN 2.6* 2.1*    Recent Labs Lab 10/01/13 1630  LIPASE 27   CBC:  Recent Labs Lab 10/01/13 1630 10/02/13 0355 10/03/13 0620 10/04/13 0500  WBC 23.4* 15.9* 13.3* 9.1  NEUTROABS 22.0* 14.3*  --   --   HGB 11.3* 9.1* 9.0* 8.6*  HCT 36.8 29.9* 29.5* 28.0*  MCV 78.8 79.3 79.1 78.9  PLT 223 149* 149* 134*   Cardiac Enzymes:  Recent Labs Lab 10/01/13 1630  TROPONINI <0.30   BNP (last 3 results)  Recent Labs  04/14/13 2300 08/10/13 1554 10/01/13 1630  PROBNP 301.6* 1099.0* 1056.0*   CBG:  Recent Labs Lab 10/04/13 1133 10/04/13 1539 10/04/13 2045 10/05/13 0051 10/05/13 0823  GLUCAP 111* 123* 143* 114* 136*   Sepsis Labs:  Recent Labs Lab 10/01/13 1630 10/01/13 1746 10/02/13 0355 10/03/13 0620 10/04/13 0500  WBC 23.4*  --  15.9* 13.3* 9.1  LATICACIDVEN  --  2.55*  --   --   --    Microbiology Recent Results (from the past 240 hour(s))  CULTURE, BLOOD (ROUTINE X 2)     Status: None   Collection Time    10/01/13  5:50 PM      Result Value Ref Range Status   Specimen Description BLOOD PICC  10 ML IN [facility] BOTTLE   Final   Special Requests NONE   Final    Culture  Setup Time     Final   Value: 10/01/2013 22:09     Performed at 10/03/2013   Culture     Final   Value:        BLOOD CULTURE RECEIVED NO GROWTH TO DATE CULTURE WILL BE HELD FOR 5 DAYS BEFORE ISSUING A FINAL NEGATIVE REPORT     Performed at [facility]   Report Status PENDING   Incomplete  CULTURE, BLOOD (ROUTINE X 2)     Status: None   Collection Time    10/01/13  6:05 PM      Result Value Ref Range Status   Specimen Description BLOOD RIGHT HAND  2 ML IN AEROBIC ONLY   Final   Special Requests NONE   Final   Culture  Setup Time     Final   Value: 10/01/2013 22:09     Performed at [facility]   Culture     Final   Value:        BLOOD CULTURE RECEIVED NO GROWTH TO DATE CULTURE WILL BE HELD FOR 5 DAYS BEFORE ISSUING A FINAL NEGATIVE REPORT     Performed at [facility]   Report Status PENDING   Incomplete  MRSA PCR SCREENING     Status: Abnormal   Collection Time    10/01/13  8:59 PM      Result Value Ref Range Status   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 It is not     intended to diagnose MRSA     infection nor to guide or     monitor treatment for     MRSA infections.     RESULT CALLED TO, READ BACK BY AND VERIFIED WITH:     GARLAND,G/2W @2235  ON 10/01/13 BY KARCZEWSKI,S.  CULTURE, EXPECTORATED SPUTUM-ASSESSMENT     Status: None   Collection Time    10/04/13  9:30 AM      Result Value Ref Range Status   Specimen Description SPUTUM   Final   Special Requests NONE   Final   Sputum evaluation     Final   Value: THIS SPECIMEN IS ACCEPTABLE. RESPIRATORY CULTURE REPORT TO FOLLOW.   Report Status 10/04/2013 FINAL   Final  CULTURE, RESPIRATORY (NON-EXPECTORATED)     Status: None   Collection Time    10/04/13  9:30 AM      Result Value Ref Range Status    Specimen Description SPUTUM   Final   Special Requests NONE   Final   Gram Stain     Final   Value: FEW WBC PRESENT, PREDOMINANTLY PMN     NO SQUAMOUS EPITHELIAL CELLS SEEN     ABUNDANT GRAM NEGATIVE RODS     Performed at 10/06/13   Culture     Final   Value: ABUNDANT GRAM NEGATIVE RODS     Performed at [facility]   Report Status PENDING   Incomplete     Radiographs/Studies:   Dg Chest 2 View  10/01/2013   CLINICAL DATA:  Shortness of breath. Asthma. Diabetes and hypertension. Rheumatoid arthritis.  EXAM: CHEST  2 VIEW  COMPARISON:  09/22/2013  FINDINGS: Bilateral upper lobe volume loss and pleural-parenchymal scarring is stable in appearance. Superior hilar retraction again seen bilaterally which is unchanged. No evidence of acute or superimposed infiltrate. No evidence of pleural effusion. Heart size remains normal. A right-sided internal jugular central venous catheter remains in stable position.  IMPRESSION: Stable upper lobe predominant pleural-parenchymal scarring. No acute findings identified.   Electronically Signed   By: 11/22/2013  Eppie Gibson M.D.   On: 10/01/2013 16:39   Medications:   . methylPREDNISolone (SOLU-MEDROL) injection  40 mg Intravenous Q24H  . nystatin cream  1 application Topical BID  . scopolamine  1 patch Transdermal Q72H  . sodium chloride  3 mL Intravenous Q12H   Continuous Infusions: . morphine 0.5 mg/hr (11/06/13 0058)    Time spent: 35 minutes with > 50% of time discussing current diagnostic test results, clinical impression and plan of care, updating multiple family members of her expected prognosis.   LOS: 6 days   Maryruth Bun Rama  [facility] Pager [phone]. If unable to reach me by pager, please call my cell phone at [phone].  *Please refer to [email or web address], password [email or web address] to get updated schedule on who will round on this patient, as hospitalists switch teams weekly. If 7PM-7AM, please contact night-coverage at  [email or web address], password [email or web address] for any overnight needs.  Nov 06, 2013, 8:06 AM    **Disclaimer: This note was dictated with voice recognition software. Similar sounding words can inadvertently be transcribed and this note may contain transcription errors which may not have been corrected upon publication of note.**   Information printed out and reviewed with the patient/family:     In an effort to keep you and your family informed about your hospital stay, I am providing you with this information sheet. If you or your family have any questions, please do not hesitate to have the nursing staff page me to set up a meeting time.  Jacqueline Washington 2013/11/06 6 (Number of days in the hospital)  Treatment team:  Dr. Hillery Aldo, Hospitalist (Internist)  Dr. Julaine Fusi, Palliative Care  Dr. Koren Bound, Lung specialist  Active Treatment Issues with Plan: Principal Problem:   Recurrent pneumonia  Initially treated for pneumonia. Antibiotics discontinued 10/06/13 as goals of care now full comfort.  Prognosis likely hours-days. Active Problems:  COPD with respiratory failure  Continue supplemental oxygen for comfort. Now on a morphine drip at 0.5 mg per hour.  Continue steroids to reduce lung inflammation to help with breathing and breathing treatments as needed.  Heart failure  Continue supplemental oxygen and morphine drip.

## 2013-10-07 NOTE — Progress Notes (Signed)
Palliative Medicine Team Progress Note   S: Patient unresponsive today. Earlier had issues with agitation and trying to pull off the venti-mask. She has increased WOB, she has been given PRN Morphine and ativan with relief, requiring more frequent dosing.  Filed Vitals:   10/06/13 2225  BP: 118/91  Pulse: 98  Temp: 98.6 F (37 C)  Resp: 16   Pale, diaphoretic. Tachy, irregular Increased rhonchi. Mottling of the feet and finger tips. +edema  Assessment: Actively dying of end stage COPD. Prognosis 24-48 hours. Worsening dyspnea, secretions and agitation. Family at bedside.  Plan: I have discussed comfort and need for a change in prn dosing of medication to continuous infusion in order to control her dyspnea. I discussed with family that death is inevitable and that if comfort is our primary goal we should begin a low dose of continuous morphine and bolus as needed. I educated them on the dying process. Patient is distressed by the ventimask-trying to pull it off so I have recommended a nasal cannula at maximum flow for now.   Start morphine infusion at 0.5mg /hr titrate for comfort bolus 2mg  q1 prn  Remove venti mask and use nasal cannula O2  Continue other comfort interventions.  Time: 35 minutes. Time: 10:30-11:05PM Greater than 50%  of this time was spent counseling and coordinating care related to the above assessment and plan.    , DO Palliative Medicine

## 2013-10-07 NOTE — Progress Notes (Signed)
.       Name: Jacqueline Washington MRN: [id number] DOB: [birth year]    ADMISSION DATE:  10/01/2013 CONSULTATION DATE:  4/20  REFERRING MD :  Rito Ehrlich  PRIMARY SERVICE:  [facility]   CHIEF COMPLAINT:   Acute on chronic respiratory failure  Possible sepsis  Failure to thrive    BRIEF PATIENT DESCRIPTION:  63 year old female, well known to [place] in setting of / chronic resp failure (pred and O2 dep (3-4 L), currently  30mg /d pred in setting of rheumatoid related pulmonary fibrosis, further c/b acute decompensations as a result of underlying emphysema and BTX, marked by recurrent bouts of what we have labeled as HCAPs (NOS) and/or recurrent bouts of  pneumonitis and bronchiolitis obliterans organized Pneumonia. Most recently discharged 3/26 after full course of ABX. Presents to ER again on 4/18 w/ CC: persistent and progressive weakness and findings c/w failure to thrive. PCCM asked to assist w/ her care.   SIGNIFICANT EVENTS / STUDIES:  feb/March 2015 we treated her empirically for CT findings c/w fungal ball w/VFEND. Her voriconazole was subsequently discontinued on March 17 as she was thought not to have aspergillosis. 3/26 discharged s/p rx for HCAP (NOS) 4/9: seen in our office w/ cc: weakness/ breathing at baseline   LINES / TUBES: PICC 2/26>>>  CULTURES: bcx2 4/18>>> MRSA screen 4/18: positive  Sputum 4/21: abundant GNR>>>  ANTIBIOTICS: vanc 4/18>>>4/22 primaxin 4/18>>>4/22 cipro 4/18>>>4/22  SUBJECTIVE:  Appears comfortable on morphine drip.  VITAL SIGNS: Temp:  [97.6 F (36.4 C)-98.6 F (37 C)] 97.7 F (36.5 C) (04/24 0535) Pulse Rate:  [98-130] 127 (04/24 0535) Resp:  [16-27] 20 (04/24 0535) BP: (118-146)/(72-91) 119/72 mmHg (04/24 0535) SpO2:  [98 %-100 %] 100 % (04/24 0535) FiO2 (%):  [50 %] 50 % (04/23 1448)  PHYSICAL EXAMINATION: General: Chronically, remains terminally ill appearing.  Much more comfortable appearing on morphine drip. Neuro: Arousable somewhat but very  somnolent. HEENT: [place], no JVD Cardiovascular:  rrr Lungs: Diffuse coarse rhonchi, + accessory muscle use  Abdomen: Soft, no oM  Musculoskeletal: Generalized weakness Skin: Anasarca, scattered ecchymosis   Recent Labs Lab 10/03/13 0620 10/04/13 0500 10/05/13 0313  NA 142 138 144  K 3.4* 4.1 2.9*  CL 102 98 98  CO2 27 28 33*  BUN 18 17 18   CREATININE 0.79 0.80 0.95  GLUCOSE 84 107* 126*    Recent Labs Lab 10/02/13 0355 10/03/13 0620 10/04/13 0500  HGB 9.1* 9.0* 8.6*  HCT 29.9* 29.5* 28.0*  WBC 15.9* 13.3* 9.1  PLT 149* 149* 134*   No results found.  ASSESSMENT / PLAN:  1) Chronic respiratory failure in setting of rheumatoid related ILD, COPD (GOLD C) and bronchiectasis  Recurrent pneumonias/ bronchiectasis flares 2) Recurrent sepsis: source is likely lungs 3) H/o diastolic dysfxn 4) AKI -->improved w/ IVFs 5) Stage II CKD 6) Type 2 NIDDM 7) Failure to thrive.  8) actively dying  Discussion Notes from palliative reviewed. Appreciate assistance. Appears comfortable on current rx   Recs: - Agree w/ current comfort oriented focus, start morphine drip. - Recommend no further high flow O2, [place] only and at low flow as this will only dry the patient's nose and result in more suffering. - Continue comfort measures per palliative care recommendations. - PCCM will sign off, please call back if needed.  10/05/13, M.D. [facility] Pulmonary/Critical Care Medicine. Pager: [phone]. After hours pager: [phone].

## 2013-10-07 NOTE — Progress Notes (Signed)
Started pt on continous morphine drip per MD order started 0.5mg /hr pt tolerating well. Pt is is relaxed and resting. Changed O2 route from venti mask to nasal canula. Pt seems to be doing well; not trying to pull off anymore.

## 2013-10-07 NOTE — Care Management Note (Signed)
Cm spoke with patient's adult daughters present at the bedside concerning GIP referral. Per attending patient anticipated in hospital death. Pt's daughters offered choice of Inpatient Hospice Agencies. Per family choice [facility] to provide GIP service. [facility] liaison Valente David notified. Family informed of GIP process in which [facility] liaison will speak to family at bedside and forms completed if patient assessed as GIP appropriate. Will continue to follow.    Roxy Manns Col[place]uono,MSN,RN [phone]

## 2013-10-07 NOTE — Discharge Summary (Signed)
Physician Discharge Summary  Jacqueline Washington NOM:[id number] DOB: 1950-11-21 DOA: 10/01/2013  PCP: Nadean Corwin, MD  Admit date: 10/01/2013 Discharge date: 2013-10-15   Recommendations for Outpatient Follow-Up:   1. The patient is being discharged to general inpatient hospice status for end-of-life care.   Discharge Diagnosis:   Principal Problem:    Severe sepsis with acute organ dysfunction Active Problems:    Copd Gold C     Acute on chronic diastolic congestive heart failure, NYHA class 1    Hypothyroidism    T2 NIDDM w/Stage II CKD (GFR 73 ml/min)    Protein-calorie malnutrition, severe    Rheumatoid arthritis(714.0)    Acute and chronic respiratory failure    Hypertension    Obesity (BMI 30-39.9)    HCAP (healthcare-associated pneumonia)    DNR (do not resuscitate)    AKI (acute kidney injury)    Hyperkalemia    Sacral decubitus ulcer    Bronchiectasis with acute exacerbation    Normocytic anemia   Discharge Condition: Terminal.  Diet recommendation: Comfort feeds.   History of Present Illness:   Jacqueline Washington is an 63 y.o. female with a PMH of end-stage pulmonary disease, chronic respiratory failure on 3 L of home oxygen, COPD, recurrent pneumonias who was admitted on 10/01/13 with generalized weakness, failure to thrive, and sepsis with acute renal failure. Despite aggressive therapy with antibiotics, her respiratory status has deteriorated and she required BiPAP which was weaned to a Ventimask on 10/04/13. She is now receiving full comfort care and is not expected to survive this hospital stay, and as such, she is being transitioned to GIP status.  Hospital Course by Problem:   Principal Problem:  Severe sepsis with acute organ dysfunction / healthcare associated pneumonia  Although initial chest x-ray clear, source of sepsis felt to be from lungs in the setting of bronchiectasis/COPD.  Blood cultures negative to date, sputum culture  pending (but growing GNR).  Antibiotics discontinued 10/06/13 with transitioned to full comfort care.  Patient not expected to survive this hospital stay, and is being discharged/readmitted to GIP. Active Problems:  Copd Gold C / acute and chronic respiratory failure / bronchiectasis with acute exacerbation / DO NOT RESUSCITATE  Continue supplemental oxygen for comfort. Now on a morphine drip.  In light of full comfort measures, the patient is receiving duonebs and Solu-Medrol, all other respiratory therapies have been discontinued. Acute on chronic diastolic congestive heart failure, NYHA class 1  Continue supplemental oxygen and morphine drip. Hypothyroidism  Synthroid discontinued. 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 Protein-calorie malnutrition, severe / Obesity (BMI 30-39.9)  No oral intake. Rheumatoid arthritis(714.0)  On chronic steroids prior to admission, currently on Solu-Medrol. Hypertension  Lasix discontinued. AKI (acute kidney injury)  No further lab draws. Hyperkalemia / hypokalemia  No further lab draws, initially hyperkalemia, then hypokalemic requiring supplementation. Sacral decubitus ulcer  Seen by wound care nurse 10/03/13. Continue wound care per her recommendations. Normocytic anemia  Likely anemia of chronic disease. No further lab draws.  Procedures:    None.   Medical Consultants:    Dr. Julaine Fusi, Palliative Care   Dr. Koren Bound, PCCM  Discharge Exam:   Filed Vitals:   Oct 15, 2013 0535  BP: 119/72  Pulse: 127  Temp: 97.7 F (36.5 C)  Resp: 20   Filed Vitals:   10/06/13 1200 10/06/13 1448 10/06/13 2225 10/15/2013 0535  BP:  146/78 118/91 119/72  Pulse: 130 128  98 127  Temp:  97.6 F (36.4 C) 98.6 F (37 C) 97.7 F (36.5 C)  TempSrc:  Oral  Axillary  Resp: 27 22 16 20   Height:      Weight:      SpO2: 98% 98% 99% 100%    Exam: Gen: Unresponsive  Cardiovascular: Tachycardic, No  M/R/G  Respiratory: Lungs with scattered rales, diminished bases, agonal breathing  Gastrointestinal: Abdomen soft, NT/ND, + BS  Extremities: + Edema/Anasarca to upper and lower extremities   Discharge Instructions:    Future Appointments Provider Department Dept Phone   10/14/2013 10:00 AM 12/14/2013, PA-C [place] [facility] [phone]   10/19/2013 9:45 AM 12/19/2013, MD [place] [facility] [phone]   11/24/2013 11:00 AM 01/24/2014, PA-C [place] [facility] [phone]   03/31/2014 9:30 AM 04/02/2014, PA-C [place] [facility] [phone]       Medication List    ASK your doctor about these medications       albuterol (2.5 MG/3ML) 0.083% nebulizer solution  Commonly known as:  PROVENTIL  Take 3 mLs (2.5 mg total) by nebulization 4 (four) times daily. DX 496     cetirizine 10 MG tablet  Commonly known as:  ZYRTEC  Take 10 mg by mouth daily.     feeding supplement (ENSURE COMPLETE) Liqd  Take 237 mLs by mouth 2 (two) times daily between meals.     FIRST-DUKES MOUTHWASH Susp  5 ml swish and swallow every 1-2 hours for blisters in mouth     furosemide 40 MG tablet  Commonly known as:  LASIX  Take 40 mg by mouth daily.     ipratropium 0.02 % nebulizer solution  Commonly known as:  ATROVENT  Take 2.5 ml by Nebulizer 4 x daily     leflunomide 20 MG tablet  Commonly known as:  ARAVA  Take 20 mg by mouth daily.     levothyroxine 50 MCG tablet  Commonly known as:  SYNTHROID, LEVOTHROID  Take 50 mcg by mouth daily before breakfast.     lip balm ointment  Apply 1 application topically as needed for lip care.     mometasone-formoterol 200-5 MCG/ACT Aero  Commonly known as:  DULERA  Inhale 2 puffs into the lungs 2 (two) times daily.     nystatin cream  Commonly known as:  MYCOSTATIN  Apply 1 application topically 2 (two) times daily.     pantoprazole 40 MG tablet    Commonly known as:  PROTONIX  Take 1 tablet (40 mg total) by mouth daily.     potassium chloride SA 20 MEQ tablet  Commonly known as:  K-DUR,KLOR-CON  Take 60 mEq by mouth daily.     predniSONE 20 MG tablet  Commonly known as:  DELTASONE  Take 1.5 tablets (30 mg total) by mouth daily with breakfast.          The results of significant diagnostics from this hospitalization (including imaging, microbiology, ancillary and laboratory) are listed below for reference.     Significant Diagnostic Studies:   Radiographs: Dg Chest 1 View  09/22/2013   CLINICAL DATA:  History of pneumonia, cough, shortness of breath, prior tobacco use, COPD  EXAM: CHEST - 1 VIEW  COMPARISON:  DG CHEST 1V PORT dated 09/06/2013  FINDINGS: Low lung volumes. Cardiac silhouette within the upper limits of normal. Right central venous catheter tip in the region of the right atrium unchanged from prior. The fibrotic and emphysematous  changes within the right left hemithoraces are stable. Stable small left pleural effusion. No new focal regions of consolidation or focal infiltrates. No acute osseous abnormalities.  IMPRESSION: Stable emphysematous and fibrotic changes within the right and left hemithoraces. Stable trace left effusion. No acute abnormalities.   Electronically Signed   By: Salome Holmes M.D.   On: 09/22/2013 10:23   Dg Chest 2 View  10/01/2013   CLINICAL DATA:  Shortness of breath. Asthma. Diabetes and hypertension. Rheumatoid arthritis.  EXAM: CHEST  2 VIEW  COMPARISON:  09/22/2013  FINDINGS: Bilateral upper lobe volume loss and pleural-parenchymal scarring is stable in appearance. Superior hilar retraction again seen bilaterally which is unchanged. No evidence of acute or superimposed infiltrate. No evidence of pleural effusion. Heart size remains normal. A right-sided internal jugular central venous catheter remains in stable position.  IMPRESSION: Stable upper lobe predominant pleural-parenchymal scarring.  No acute findings identified.   Electronically Signed   By: Myles Rosenthal M.D.   On: 10/01/2013 16:39    Labs:  Basic Metabolic Panel:  Recent Labs Lab 10/01/13 1630  10/02/13 0355  10/03/13 0620 10/04/13 0500 10/05/13 0313  NA 135*  --  141  --  142 138 144  K 5.9*  < > 3.8  < > 3.4* 4.1 2.9*  CL 94*  --  102  --  102 98 98  CO2 26  --  27  --  27 28 33*  GLUCOSE 185*  --  103*  --  84 107* 126*  BUN 35*  --  26*  --  18 17 18   CREATININE 1.17*  --  0.84  --  0.79 0.80 0.95  CALCIUM 10.6*  --  9.0  --  9.6 9.5 9.4  MG  --   --   --   --  1.5  --   --   PHOS  --   --   --   --  3.0  --   --   < > = values in this interval not displayed. GFR Estimated Creatinine Clearance: 63 ml/min (by C-G formula based on Cr of 0.95). Liver Function Tests:  Recent Labs Lab 10/01/13 1630 10/03/13 0620  AST 17 16  ALT 19 17  ALKPHOS 128* 106  BILITOT 0.3 0.2*  PROT 7.1 5.6*  ALBUMIN 2.6* 2.1*    Recent Labs Lab 10/01/13 1630  LIPASE 27   CBC:  Recent Labs Lab 10/01/13 1630 10/02/13 0355 10/03/13 0620 10/04/13 0500  WBC 23.4* 15.9* 13.3* 9.1  NEUTROABS 22.0* 14.3*  --   --   HGB 11.3* 9.1* 9.0* 8.6*  HCT 36.8 29.9* 29.5* 28.0*  MCV 78.8 79.3 79.1 78.9  PLT 223 149* 149* 134*   Cardiac Enzymes:  Recent Labs Lab 10/01/13 1630  TROPONINI <0.30   CBG:  Recent Labs Lab 10/04/13 1133 10/04/13 1539 10/04/13 2045 10/05/13 0051 10/05/13 0823  GLUCAP 111* 123* 143* 114* 136*   Microbiology Recent Results (from the past 240 hour(s))  CULTURE, BLOOD (ROUTINE X 2)     Status: None   Collection Time    10/01/13  5:50 PM      Result Value Ref Range Status   Specimen Description BLOOD PICC  10 ML IN [facility] BOTTLE   Final   Special Requests NONE   Final   Culture  Setup Time     Final   Value: 10/01/2013 22:09     Performed at 10/03/2013  Final   Value: NO GROWTH 5 DAYS     Performed at [facility]   Report Status 10/13/13 FINAL    Final  CULTURE, BLOOD (ROUTINE X 2)     Status: None   Collection Time    10/01/13  6:05 PM      Result Value Ref Range Status   Specimen Description BLOOD RIGHT HAND  2 ML IN AEROBIC ONLY   Final   Special Requests NONE   Final   Culture  Setup Time     Final   Value: 10/01/2013 22:09     Performed at [facility]   Culture     Final   Value: NO GROWTH 5 DAYS     Performed at [facility]   Report Status 10/13/2013 FINAL   Final  MRSA PCR SCREENING     Status: Abnormal   Collection Time    10/01/13  8:59 PM      Result Value Ref Range Status   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 It is not     intended to diagnose MRSA     infection nor to guide or     monitor treatment for     MRSA infections.     RESULT CALLED TO, READ BACK BY AND VERIFIED WITH:     GARLAND,G/2W @2235  ON 10/01/13 BY KARCZEWSKI,S.  CULTURE, EXPECTORATED SPUTUM-ASSESSMENT     Status: None   Collection Time    10/04/13  9:30 AM      Result Value Ref Range Status   Specimen Description SPUTUM   Final   Special Requests NONE   Final   Sputum evaluation     Final   Value: THIS SPECIMEN IS ACCEPTABLE. RESPIRATORY CULTURE REPORT TO FOLLOW.   Report Status 10/04/2013 FINAL   Final  CULTURE, RESPIRATORY (NON-EXPECTORATED)     Status: None   Collection Time    10/04/13  9:30 AM      Result Value Ref Range Status   Specimen Description SPUTUM   Final   Special Requests NONE   Final   Gram Stain     Final   Value: FEW WBC PRESENT, PREDOMINANTLY PMN     NO SQUAMOUS EPITHELIAL CELLS SEEN     ABUNDANT GRAM NEGATIVE RODS     Performed at 10/06/13   Culture     Final   Value: ABUNDANT STENOTROPHOMONAS MALTOPHILIA     Performed at [facility]   Report Status PENDING   Incomplete   Organism ID,  Bacteria STENOTROPHOMONAS MALTOPHILIA   Final    Time coordinating discharge: 35 minutes.  Signed[facility] Devices Obera Stauch  Pager [phone] [facility] Oct 13, 2013, 10:53 AM

## 2013-10-07 NOTE — Progress Notes (Signed)
Came per staff request as patient is actively dying. Supported family gathered in room. Presence; listening; grief support; prayer.  Donnelly Stager, [facility], PhD Chaplain

## 2013-10-08 DIAGNOSIS — J471 Bronchiectasis with (acute) exacerbation: Secondary | ICD-10-CM

## 2013-10-08 DIAGNOSIS — J9620 Acute and chronic respiratory failure, unspecified whether with hypoxia or hypercapnia: Secondary | ICD-10-CM

## 2013-10-08 DIAGNOSIS — K5900 Constipation, unspecified: Secondary | ICD-10-CM

## 2013-10-08 DIAGNOSIS — Z515 Encounter for palliative care: Secondary | ICD-10-CM

## 2013-10-08 LAB — CULTURE, RESPIRATORY W GRAM STAIN

## 2013-10-08 LAB — CULTURE, RESPIRATORY

## 2013-10-13 LAB — AFB CULTURE WITH SMEAR (NOT AT ARMC)
Acid Fast Smear: NONE SEEN
SPECIAL REQUESTS: NORMAL

## 2013-10-14 ENCOUNTER — Ambulatory Visit: Payer: Self-pay | Admitting: Physician Assistant

## 2013-10-14 NOTE — Progress Notes (Signed)
Medication Morphine GTT was disconnected and wasted with RN Arline Asp.  Total waste was .

## 2013-10-14 NOTE — Progress Notes (Signed)
DNR.  Related admission.  Patient appears very comfortable at this time.  No family present at the bedside.  Spoke with Arline Asp, RN for patient and chart reviewed.  Please contact [facility] [phone] with any questions or concerns.  Willette Pa, RN [facility]

## 2013-10-14 NOTE — Progress Notes (Cosign Needed)
Continuous Morphine Gtt fluid expired.  New bag hung with 67 ml of excess wasted in sink.  Witness was Building control surveyor.  Patient remains without s/s of acute pain.

## 2013-10-14 NOTE — Significant Event (Signed)
Called to room by family, patient unresponsive, no respirations or heart beat auscultated. Time of death 86.

## 2013-10-14 NOTE — H&P (Signed)
Patient: Jacqueline Washington DOB: 1950-10-19  MR#: [id number] Date of Admission: 10/04/2013 Palliative Medicine Team- GIP Admission Note  Admission From: [facility]  Admitting MD: Phillips Odor   Reason for Admission: EOL Symptom Management   HPI Summary:  63 yo with advanced COPD, recurrent PNA, multiple hospitalization and recent complicated hospitalization with ICU stay difficulty weaning from BiPap. Palliative consult obtain and decision was made to transition to full comfort at patient's request. GIP requested for difficult to manage dyspnea.   Review of Systems:  Unable to obtain from patient    Medical/Family/Social History:  Past Medical History  Diagnosis Date  . Asthma   . Emphysema     02 dependent 3L/min  . Emphysema   . Pneumonia   . Hypothyroidism   . Diabetes mellitus, type II   . Obesity   . Diastolic heart failure 09/22/2012    Grade 1  . Emphysema   . Rheumatoid arthritis(714.0)   . Hyperlipidemia   . Hypertension   . Vitamin D deficiency     History  Substance Use Topics  . Smoking status: Former Smoker -- 3.00 packs/day for 43 years    Types: Cigarettes    Quit date: 04/24/2007  . Smokeless tobacco: Never Used  . Alcohol Use: Yes     Comment: occasional    Family History  Problem Relation Age of Onset  . Asthma Mother   . Heart disease Mother   . Clotting disorder Mother   . Heart attack Mother   . Diabetes Mother   . Hypertension Mother   . Cirrhosis Mother   . Colon cancer Neg Hx   . Heart attack Father     History   Social History  . Marital Status: Married    Spouse Name: N/A    Number of Children: 2  . Years of Education: N/A   Occupational History  .     Social History Main Topics  . Smoking status: Former Smoker -- 3.00 packs/day for 43 years    Types: Cigarettes    Quit date: 04/24/2007  . Smokeless tobacco: Never Used  . Alcohol Use: Yes     Comment: occasional  . Drug Use: No  . Sexual Activity: No   Other Topics Concern  . Not on  file   Social History Narrative  . No narrative on file    Ethnicity: white  Home Services/Other: none  Contact Person:  Husband DNR (before admission): yes  Living Will (before admission): no  Health Care Proxy (before admission): Husband  Physical Exam:  Filed Vitals:   09/20/2013 0639  BP: 126/66  Pulse: 124  Temp: 97.7 F (36.5 C)  Resp:   Obese, Dusky, +wheezing diffusely, minimally responsive, on venti mask, abdomen tense, hypoactive BS, extremities with edema and early mottling   Allergies  Allergen Reactions  . Chantix [Varenicline] Other (See Comments)    Unknown " blurry vision"  . Hctz [Hydrochlorothiazide] Other (See Comments)    unknown  . Penicillins Rash  . Sulfonamide Derivatives Rash  . Tetanus Toxoid Other (See Comments)    Knot on arm    Current Medications: Scheduled Meds: . scopolamine  1 patch Transdermal Q72H   Continuous Infusions: . morphine 1 mg/hr (10/03/2013 1728)   PRN Meds:.ipratropium-albuterol, LORazepam, morphine  Data:  Reviewed. Assessment and Plan:   1.) End Stage COPD, actively dying.   MATTELYN IMHOFF has the following symptoms that are being actively managed and addressed:  1.) Dyspnea 2.) Agitation 3.)  Airway secretions  Transition planning discussed: YES  Goals of care addressed: YES-PMT consult  Spiritual support offered: per Hospice team/Chaplain   Patient requires aggressive, intensive treatment to control pain and/or symptoms. Care can not feasibly be provided in any other setting.  Prognosis: hours-days  Start Time: 6PM End Time: 7PM Total Attending time (in minutes): 60  Greater than 50% if encounter time was spent in counseling and/or care coordination as documented above: YES  Anderson Malta, DO Palliative Medicine

## 2013-10-14 NOTE — Progress Notes (Signed)
Palliative Care Team at [facility] Progress Note   SUBJECTIVE: Unresponsive  Interval Events: GIP admission 4/24  OBJECTIVE: Vital Signs: BP 126/66  Pulse 124  Temp(Src) 97.7 F (36.5 C) (Axillary)  Resp 16  SpO2 94%   Intake and Output: 04/24 0701 - 04/25 0700 In: 0  Out: 245 [Urine:245]  Physical Exam: General: Vital signs reviewed and noted. Unresponsive, Cyanotic, nasal cannula .  Head: Normocephalic, atraumatic.  Lungs:  Diffuse wheeze, poor air movement  Heart: Tachycardic irregular  Abdomen:  BS normoactive.  Nondistended, non-tender.  No masses or organomegaly.  Extremities: +mottling    Allergies  Allergen Reactions  . Chantix [Varenicline] Other (See Comments)    Unknown " blurry vision"  . Hctz [Hydrochlorothiazide] Other (See Comments)    unknown  . Penicillins Rash  . Sulfonamide Derivatives Rash  . Tetanus Toxoid Other (See Comments)    Knot on arm    Medications: Scheduled Meds:  . scopolamine  1 patch Transdermal Q72H    Continuous Infusions: . morphine 1 mg/hr (10-18-13 1728)    PRN Meds: ipratropium-albuterol, LORazepam, morphine  Stool Softner: yes  Palliative Performance Scale: 10 %   Labs: CBC    Component Value Date/Time   WBC 9.1 10/04/2013 0500   RBC 3.55* 10/04/2013 0500   HGB 8.6* 10/04/2013 0500   HCT 28.0* 10/04/2013 0500   PLT 134* 10/04/2013 0500   MCV 78.9 10/04/2013 0500   MCH 24.2* 10/04/2013 0500   MCHC 30.7 10/04/2013 0500   RDW 24.7* 10/04/2013 0500   LYMPHSABS 0.6* 10/02/2013 0355   MONOABS 1.0 10/02/2013 0355   EOSABS 0.0 10/02/2013 0355   BASOSABS 0.0 10/02/2013 0355    CMET     Component Value Date/Time   NA 144 10/05/2013 0313   K 2.9* 10/05/2013 0313   CL 98 10/05/2013 0313   CO2 33* 10/05/2013 0313   GLUCOSE 126* 10/05/2013 0313   BUN 18 10/05/2013 0313   CREATININE 0.95 10/05/2013 0313   CREATININE 0.90 09/14/2013 1052   CALCIUM 9.4 10/05/2013 0313   PROT 5.6* 10/03/2013 0620   ALBUMIN 2.1* 10/03/2013 0620    AST 16 10/03/2013 0620   ALT 17 10/03/2013 0620   ALKPHOS 106 10/03/2013 0620   BILITOT 0.2* 10/03/2013 0620   GFRNONAA 63* 10/05/2013 0313   GFRNONAA 69 09/14/2013 1052   GFRAA 73* 10/05/2013 0313   GFRAA 79 09/14/2013 1052     Imaging: Reviewed.  ASSESSMENT/ PLAN: 63 yo actively dying of end stage COPD, recurrent PNA. Morphine infusion started at 0.5hr yesterday-improved symptoms. She had urinary retention and clogged Foley today. Family at bedside struggling with prolonged dying process and transition- seeking closure and righting past wrongs at bedside.   Increase Morphine infusion to 1mg /hr for dyspnea  Foley replaced and now with better output.  Briefly discussed removing or turning down O2 with daughter/husband outside of the room- daughter is not yet ready to take that step.     Time In: 5PM Time Out: 545 Total Time Spent with Patient: 45 min Total Overall Time: 60 min   Greater than 50%  of this time was spent counseling and coordinating care related to the above assessment and plan.   , DO  2013-10-18, 5:46 PM  Please contact Palliative Medicine Team phone at [phone] for questions and concerns.

## 2013-10-14 NOTE — Progress Notes (Signed)
Morphine GTT was wasted with RN Arline Asp.  Total wasted was .

## 2013-10-14 NOTE — Progress Notes (Signed)
Witnessed waste of Morphine GTT100 ml with Al RN.

## 2013-10-14 NOTE — Progress Notes (Signed)
Paged at 6:43pm when the patient had died.  Several family members present.  Talked with the family (daughters, husband, sons, grand kids).  Gradually moved from tears and silence to stories.  Prayed with family.  Husband was pretty broken up (married 10 years).  Rema Jasmine, Chaplain Pager: [phone]

## 2013-10-14 DEATH — deceased

## 2013-10-19 ENCOUNTER — Ambulatory Visit: Payer: Medicare Other | Admitting: Critical Care Medicine

## 2013-10-31 ENCOUNTER — Ambulatory Visit: Payer: Self-pay | Admitting: Emergency Medicine

## 2013-11-14 NOTE — Discharge Summary (Addendum)
Death Summary  REBEKKAH POWLESS ZHY:[id number] DOB: Sep 20, 1950 DOA: Oct 14, 2013  PCP: Nadean Corwin, MD PCP/Office notified: yes  Admit date: 2013-10-14 Date of Death: 10/15/13  Final Diagnoses:  Active Problems:   Copd Gold C     1. Chronic Respiratory Failure secondary to COPD, end stage 2. Pneumonia 3. Obesity 4. Rheumatoid Arthritis 5. Diabetes 6. Hypertension 7. Hypothyroidism  History of present illness:  63 yo with advanced COPD, recurrent PNA, multiple hospitalizations and recent complicated hospitalization with ICU stay difficulty weaning from BiPap. Palliative consult obtain and decision was made to transition to full comfort at patient's request. GIP requested for difficult to manage dyspnea.   Hospital Course:  Patient was admitted to [facility] hospice care on 10/15/2022 for management of terminal dyspnea, agitation and pain at EOL. Patient required aggressive, intensive treatment to control these symptoms that included a continuous morphine infusion, oxygen, airway suctioning and medications to control airway secretions. Her care could not have feasibly been provided in any other setting.  She expired on 16-Oct-2022 at 1830. Family at bedside. RN pronounced. Chaplain provided support. Death certificate completed and returned to funeral home.   Time: 20 minutes in review and dictation.  Signed:  Edsel Petrin  [facility] 10/25/2013, 1:16 PM

## 2013-11-24 ENCOUNTER — Ambulatory Visit: Payer: Self-pay | Admitting: Physician Assistant

## 2014-03-31 ENCOUNTER — Encounter: Payer: Self-pay | Admitting: Physician Assistant

## 2014-08-10 ENCOUNTER — Encounter: Payer: Self-pay | Admitting: Cardiology

## 2015-08-07 IMAGING — XA IR US GUIDE VASC ACCESS RIGHT
1 series · 2 of 2 positions shown · non-contrast
Comparison: none

CLINICAL DATA: Rheumatoid arthritis

[Series 300: line placements · 2 of 2 slices shown]
[im 1/2]
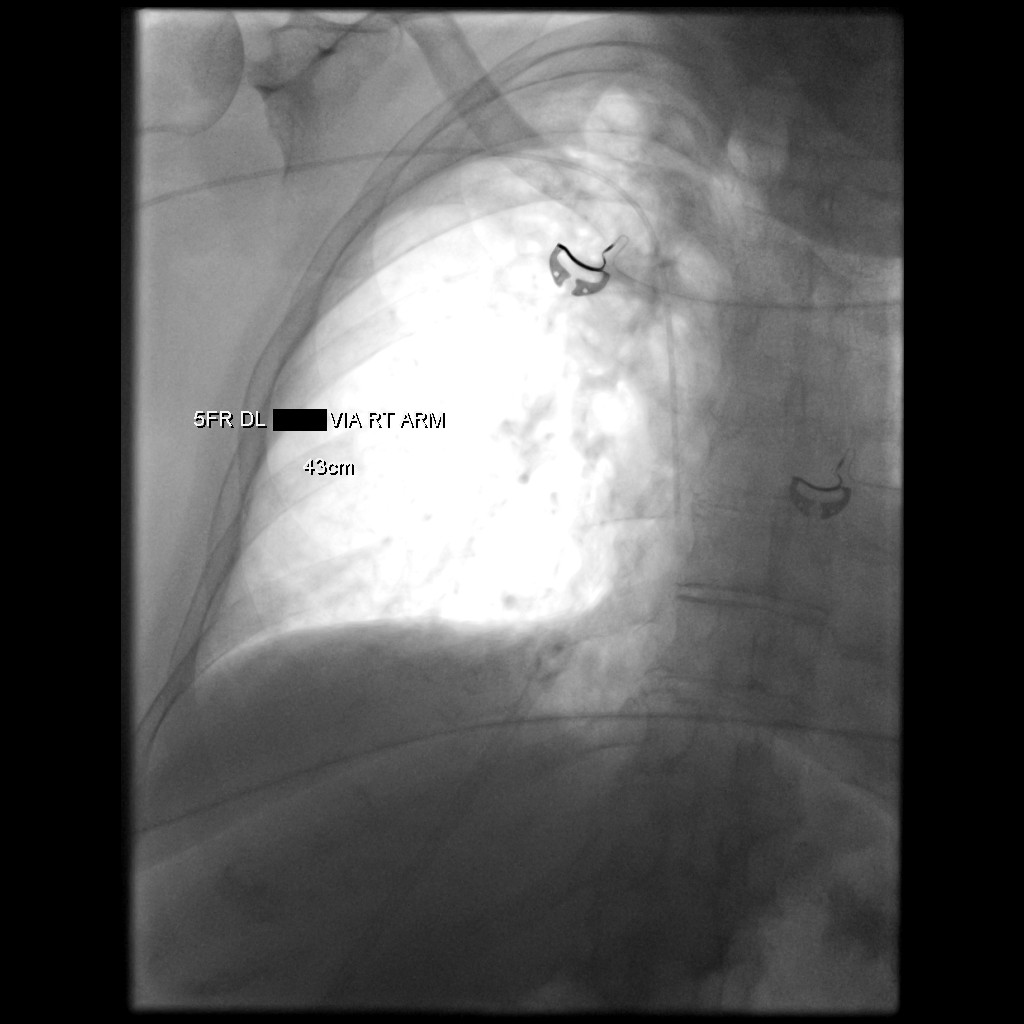
[im 2/2]
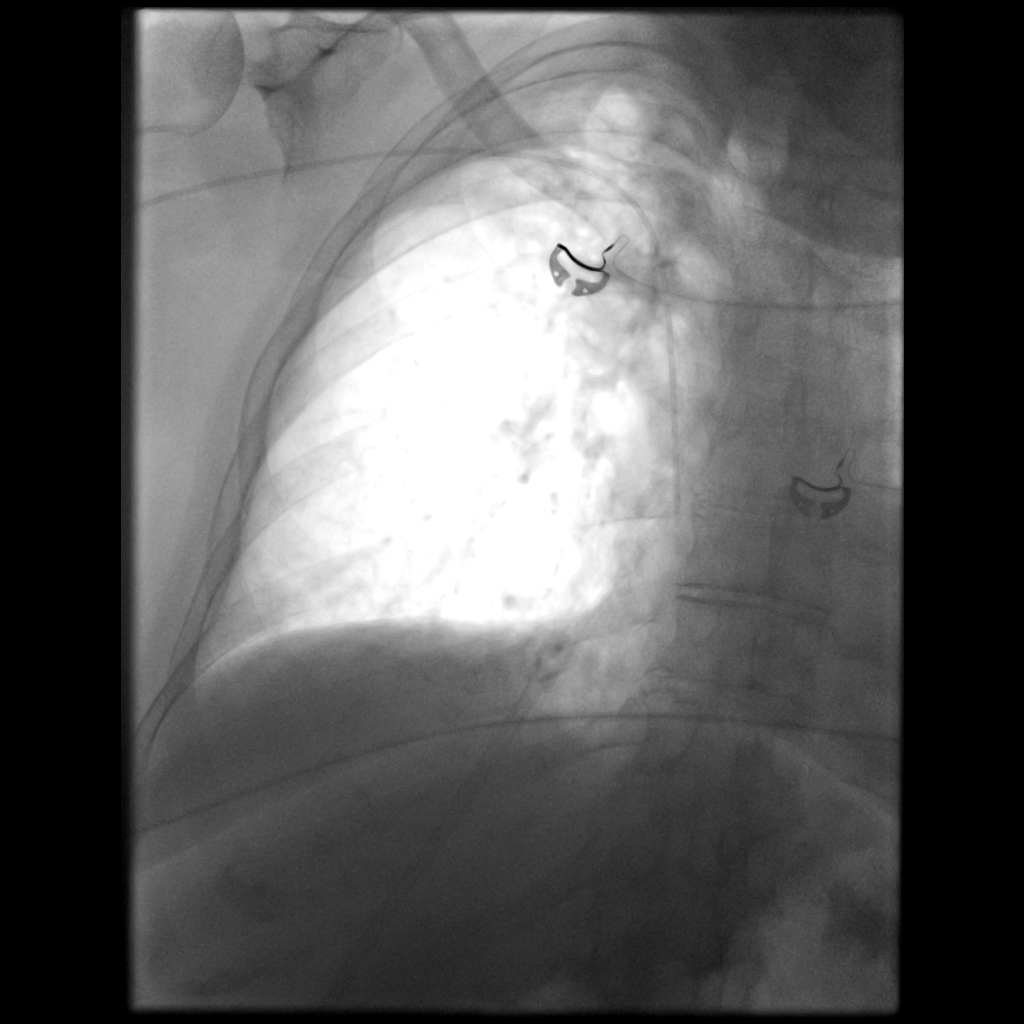

[2 of 2 positions shown; findings below may reference images not displayed]

EXAM:
Right upper extremity PICC LINE PLACEMENT WITH ULTRASOUND AND
FLUOROSCOPIC GUIDANCE

FLUOROSCOPY TIME:  24 seconds.

PROCEDURE:
The patient was advised of the possible risks andcomplications and
agreed to undergo the procedure. The patient was then brought to the
angiographic suite for the procedure.

The right arm was prepped with chlorhexidine, drapedin the usual
sterile fashion using maximum barrier technique (cap and mask,
sterile gown, sterile gloves, large sterile sheet, hand hygiene and
cutaneous antisepsis) and infiltrated locally with 1% Lidocaine.

Ultrasound demonstrated patency of the right basilic vein, and this
was documented with an image. Under real-time ultrasound guidance,
this vein was accessed with a 21 gauge micropuncture needle and
image documentation was performed. A [DATE] wire was introduced in to
the vein. Over this, a 5 French double lumen Power PICC was advanced
to the lower SVC/right atrial junction. Fluoroscopy during the
procedure and fluoro spot radiograph confirms appropriate catheter
position. The catheter was flushed and covered with asterile
dressing.

Complications: None.
IMPRESSION: Successful right arm Power PICC line placement with ultrasound and
fluoroscopic guidance. The catheter is ready for use.

## 2015-08-08 IMAGING — US IR US GUIDE VASC ACCESS RIGHT
1 series · 1 of 1 positions shown · non-contrast
Comparison: none

INDICATION: Malfunctioning right upper extremity approach PICC line; in need of
intravenous access for medication administration and blood draws.

[Series 1: ir fluoro guide cv line*right* · 1 of 1 slices shown]
[im 1/1]
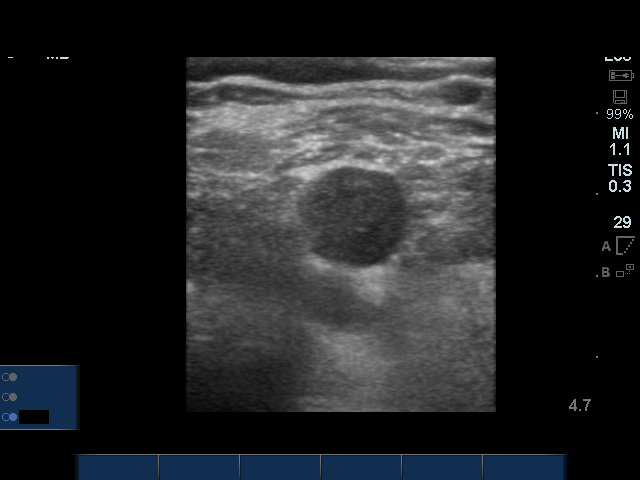

[1 of 1 positions shown; findings below may reference images not displayed]

EXAM:
TUNNELED PICC PLACEMENT WITH ULTRASOUND AND FLUOROSCOPIC GUIDANCE

MEDICATIONS:
The patient is currently admitted to the hospital and receiving
intravenous antibiotics; The IV antibiotic was given in an
appropriate time interval prior to skin puncture.

ANESTHESIA/SEDATION:
None

CONTRAST:  None

FLUOROSCOPY TIME:  12 seconds.



After creating a small venotomy incision, a micropuncture kit was
utilized to access the right internal jugular vein under direct,
real-time ultrasound guidance after the overlying soft tissues were
anesthetized with 1% lidocaine with epinephrine. Ultrasound image
documentation was performed. The microwire was kinked to measure
appropriate catheter length. A stiff Glidewire was advanced to the
level of the IVC and the micropuncture sheath was exchanged for a
peel-away sheath. A dual lumen power PICC measuring 24 cm from tip
to cuff was tunneled in a retrograde fashion from the anterior chest
wall to the venotomy incision.

The catheter was then placed through the peel-away sheath with tips
ultimately positioned within the superior aspect of the right
atrium. Final catheter positioning was confirmed and documented with
a spot radiographic image. The catheter aspirates and flushes
normally. The catheter was flushed with appropriate volume heparin
dwells.

The catheter exit site was secured with a 0-Prolene retention
suture. The venotomy incision was closed with Dermabond and
Sing Ho. Dressings were applied. The patient tolerated the
procedure well without immediate post procedural complication.

The malfunctioning right upper extremity approach PICC line was then
removed at the bedside.

COMPLICATIONS:
None immediate
IMPRESSION: 1. Successful placement of 24 cm tip to cuff tunneled PICC via the
right internal jugular vein with tip terminating within the superior
aspect of the right atrium. The catheter is ready for immediate use.
2. Successful removal of right upper extremity approach PICC line.

## 2017-10-09 ENCOUNTER — Telehealth: Payer: Self-pay | Admitting: *Deleted

## 2017-10-09 NOTE — Telephone Encounter (Signed)
Error
